# Patient Record
Sex: Female | Born: 1945
Health system: Southern US, Community
[De-identification: ages and names within clinical notes are randomized; demographics above are authoritative.]

## PROBLEM LIST (undated history)

## (undated) DIAGNOSIS — K219 Gastro-esophageal reflux disease without esophagitis: Secondary | ICD-10-CM

## (undated) DIAGNOSIS — R0602 Shortness of breath: Secondary | ICD-10-CM

## (undated) DIAGNOSIS — I10 Essential (primary) hypertension: Secondary | ICD-10-CM

## (undated) DIAGNOSIS — Z9981 Dependence on supplemental oxygen: Secondary | ICD-10-CM

## (undated) DIAGNOSIS — E46 Unspecified protein-calorie malnutrition: Secondary | ICD-10-CM

## (undated) DIAGNOSIS — C3490 Malignant neoplasm of unspecified part of unspecified bronchus or lung: Secondary | ICD-10-CM

## (undated) DIAGNOSIS — M4306 Spondylolysis, lumbar region: Secondary | ICD-10-CM

## (undated) DIAGNOSIS — E876 Hypokalemia: Secondary | ICD-10-CM

## (undated) DIAGNOSIS — K449 Diaphragmatic hernia without obstruction or gangrene: Secondary | ICD-10-CM

## (undated) DIAGNOSIS — R0902 Hypoxemia: Secondary | ICD-10-CM

## (undated) DIAGNOSIS — K5790 Diverticulosis of intestine, part unspecified, without perforation or abscess without bleeding: Secondary | ICD-10-CM

## (undated) DIAGNOSIS — I714 Abdominal aortic aneurysm, without rupture, unspecified: Secondary | ICD-10-CM

## (undated) DIAGNOSIS — D491 Neoplasm of unspecified behavior of respiratory system: Secondary | ICD-10-CM

## (undated) HISTORY — DX: Abdominal aortic aneurysm, without rupture, unspecified: I71.40

## (undated) HISTORY — DX: Diverticulosis of intestine, part unspecified, without perforation or abscess without bleeding: K57.90

## (undated) HISTORY — DX: Essential (primary) hypertension: I10

## (undated) HISTORY — PX: TONSILLECTOMY: SUR1361

## (undated) HISTORY — DX: Unspecified protein-calorie malnutrition: E46

## (undated) HISTORY — DX: Shortness of breath: R06.02

## (undated) HISTORY — DX: Spondylolysis, lumbar region: M43.06

## (undated) HISTORY — DX: Abdominal aortic aneurysm, without rupture: I71.4

## (undated) HISTORY — PX: CATARACT EXTRACTION, BILATERAL: SHX1313

## (undated) HISTORY — DX: Diaphragmatic hernia without obstruction or gangrene: K44.9

## (undated) HISTORY — DX: Hypoxemia: R09.02

## (undated) HISTORY — PX: LUNG BIOPSY: SHX232

## (undated) HISTORY — DX: Gastro-esophageal reflux disease without esophagitis: K21.9

## (undated) HISTORY — DX: Hypokalemia: E87.6

---

## 1999-10-27 ENCOUNTER — Encounter: Admission: RE | Admit: 1999-10-27 | Discharge: 1999-10-27 | Payer: Self-pay | Admitting: Specialist

## 1999-10-27 ENCOUNTER — Ambulatory Visit (HOSPITAL_COMMUNITY): Admission: RE | Admit: 1999-10-27 | Discharge: 1999-10-27 | Payer: Self-pay | Admitting: Specialist

## 1999-10-27 ENCOUNTER — Encounter: Payer: Self-pay | Admitting: Specialist

## 2010-06-29 HISTORY — PX: NM MYOVIEW LTD: HXRAD82

## 2010-09-23 ENCOUNTER — Emergency Department (HOSPITAL_COMMUNITY)
Admission: EM | Admit: 2010-09-23 | Discharge: 2010-09-23 | Disposition: A | Discharge status: Home / Self Care | Payer: Medicare Other | Attending: Emergency Medicine | Admitting: Emergency Medicine

## 2010-09-23 ENCOUNTER — Emergency Department (HOSPITAL_COMMUNITY): Payer: Medicare Other

## 2010-09-23 DIAGNOSIS — I714 Abdominal aortic aneurysm, without rupture, unspecified: Secondary | ICD-10-CM | POA: Insufficient documentation

## 2010-09-23 DIAGNOSIS — M549 Dorsalgia, unspecified: Secondary | ICD-10-CM | POA: Insufficient documentation

## 2010-09-23 DIAGNOSIS — F17200 Nicotine dependence, unspecified, uncomplicated: Secondary | ICD-10-CM | POA: Insufficient documentation

## 2010-09-23 DIAGNOSIS — K5730 Diverticulosis of large intestine without perforation or abscess without bleeding: Secondary | ICD-10-CM | POA: Insufficient documentation

## 2010-09-23 DIAGNOSIS — R109 Unspecified abdominal pain: Secondary | ICD-10-CM | POA: Insufficient documentation

## 2010-09-23 LAB — DIFFERENTIAL
Basophils Relative: 0 % (ref 0–1)
Lymphs Abs: 1.5 10*3/uL (ref 0.7–4.0)
Monocytes Absolute: 0.3 10*3/uL (ref 0.1–1.0)
Monocytes Relative: 4 % (ref 3–12)
Neutro Abs: 6.2 10*3/uL (ref 1.7–7.7)
Neutrophils Relative %: 78 % — ABNORMAL HIGH (ref 43–77)

## 2010-09-23 LAB — COMPREHENSIVE METABOLIC PANEL
BUN: 11 mg/dL (ref 6–23)
CO2: 26 mEq/L (ref 19–32)
Calcium: 8.7 mg/dL (ref 8.4–10.5)
Chloride: 99 mEq/L (ref 96–112)
Creatinine, Ser: 0.82 mg/dL (ref 0.4–1.2)
GFR calc Af Amer: >60 mL/min (ref >60)
GFR calc non Af Amer: >60 mL/min (ref >60)
Glucose, Bld: 119 mg/dL — ABNORMAL HIGH (ref 70–99)
Total Bilirubin: 0.6 mg/dL (ref 0.3–1.2)

## 2010-09-23 LAB — CBC
Hemoglobin: 13.8 g/dL (ref 12.0–15.0)
MCH: 27.9 pg (ref 26.0–34.0)
MCHC: 32 g/dL (ref 30.0–36.0)
MCV: 87.1 fL (ref 78.0–100.0)
RBC: 4.95 MIL/uL (ref 3.87–5.11)

## 2010-09-23 LAB — URINALYSIS, ROUTINE W REFLEX MICROSCOPIC
Bilirubin Urine: NEGATIVE
Glucose, UA: NEGATIVE mg/dL
pH: 6 (ref 5.0–8.0)

## 2010-09-23 LAB — LIPASE, BLOOD: Lipase: 34 U/L (ref 11–59)

## 2010-09-23 LAB — URINE MICROSCOPIC-ADD ON

## 2010-09-25 ENCOUNTER — Inpatient Hospital Stay (HOSPITAL_COMMUNITY)
Admission: EM | Admit: 2010-09-25 | Discharge: 2010-09-28 | Disposition: A | Discharge status: Nursing Home (Includes ICF) | Payer: Medicare Other | Source: Home / Self Care | Attending: Internal Medicine | Admitting: Internal Medicine

## 2010-09-25 ENCOUNTER — Emergency Department (HOSPITAL_COMMUNITY): Payer: Medicare Other

## 2010-09-25 ENCOUNTER — Encounter (HOSPITAL_COMMUNITY): Payer: Self-pay

## 2010-09-25 DIAGNOSIS — R51 Headache: Secondary | ICD-10-CM | POA: Diagnosis not present

## 2010-09-25 DIAGNOSIS — Z01812 Encounter for preprocedural laboratory examination: Secondary | ICD-10-CM

## 2010-09-25 DIAGNOSIS — N390 Urinary tract infection, site not specified: Secondary | ICD-10-CM | POA: Diagnosis present

## 2010-09-25 DIAGNOSIS — M47817 Spondylosis without myelopathy or radiculopathy, lumbosacral region: Secondary | ICD-10-CM | POA: Diagnosis present

## 2010-09-25 DIAGNOSIS — I498 Other specified cardiac arrhythmias: Secondary | ICD-10-CM | POA: Diagnosis present

## 2010-09-25 DIAGNOSIS — E860 Dehydration: Secondary | ICD-10-CM | POA: Diagnosis present

## 2010-09-25 DIAGNOSIS — I714 Abdominal aortic aneurysm, without rupture, unspecified: Principal | ICD-10-CM | POA: Diagnosis present

## 2010-09-25 DIAGNOSIS — E876 Hypokalemia: Secondary | ICD-10-CM | POA: Diagnosis not present

## 2010-09-25 DIAGNOSIS — R42 Dizziness and giddiness: Secondary | ICD-10-CM | POA: Diagnosis not present

## 2010-09-25 DIAGNOSIS — Z01818 Encounter for other preprocedural examination: Secondary | ICD-10-CM

## 2010-09-25 DIAGNOSIS — Z01810 Encounter for preprocedural cardiovascular examination: Secondary | ICD-10-CM

## 2010-09-25 DIAGNOSIS — K219 Gastro-esophageal reflux disease without esophagitis: Secondary | ICD-10-CM | POA: Diagnosis present

## 2010-09-25 DIAGNOSIS — K5730 Diverticulosis of large intestine without perforation or abscess without bleeding: Secondary | ICD-10-CM | POA: Diagnosis present

## 2010-09-25 DIAGNOSIS — I10 Essential (primary) hypertension: Secondary | ICD-10-CM | POA: Diagnosis present

## 2010-09-25 LAB — URINALYSIS, ROUTINE W REFLEX MICROSCOPIC
Nitrite: NEGATIVE
Protein, ur: NEGATIVE mg/dL
Specific Gravity, Urine: 1.008 (ref 1.005–1.030)
Urobilinogen, UA: 0.2 mg/dL (ref 0.0–1.0)

## 2010-09-25 LAB — CBC
MCV: 87.5 fL (ref 78.0–100.0)
Platelets: 261 10*3/uL (ref 150–400)
RBC: 4.96 MIL/uL (ref 3.87–5.11)
WBC: 6 10*3/uL (ref 4.0–10.5)

## 2010-09-25 LAB — BASIC METABOLIC PANEL
BUN: 5 mg/dL — ABNORMAL LOW (ref 6–23)
Calcium: 9.2 mg/dL (ref 8.4–10.5)
GFR calc non Af Amer: >60 mL/min (ref >60)
Potassium: 3.3 mEq/L — ABNORMAL LOW (ref 3.5–5.1)
Sodium: 137 mEq/L (ref 135–145)

## 2010-09-25 LAB — DIFFERENTIAL
Basophils Absolute: 0 10*3/uL (ref 0.0–0.1)
Eosinophils Absolute: 0.1 10*3/uL (ref 0.0–0.7)
Lymphs Abs: 1.5 10*3/uL (ref 0.7–4.0)
Neutrophils Relative %: 68 % (ref 43–77)

## 2010-09-25 LAB — URINE MICROSCOPIC-ADD ON

## 2010-09-25 LAB — LACTIC ACID, PLASMA: Lactic Acid, Venous: 1.2 mmol/L (ref 0.5–2.2)

## 2010-09-25 MED ORDER — IOHEXOL 300 MG/ML  SOLN
100.0000 mL | Freq: Once | INTRAMUSCULAR | Status: AC | PRN
  Administered 2010-09-25: 100 mL via INTRAVENOUS

## 2010-09-26 LAB — LIPID PANEL
LDL Cholesterol: 173 mg/dL — ABNORMAL HIGH (ref 0–99)
Total CHOL/HDL Ratio: 5.9 RATIO
Triglycerides: 160 mg/dL — ABNORMAL HIGH (ref <150)
VLDL: 32 mg/dL (ref 0–40)

## 2010-09-26 LAB — COMPREHENSIVE METABOLIC PANEL
Albumin: 3.6 g/dL (ref 3.5–5.2)
Alkaline Phosphatase: 56 U/L (ref 39–117)
BUN: 2 mg/dL — ABNORMAL LOW (ref 6–23)
Creatinine, Ser: 0.77 mg/dL (ref 0.4–1.2)
Glucose, Bld: 159 mg/dL — ABNORMAL HIGH (ref 70–99)
Total Bilirubin: 0.6 mg/dL (ref 0.3–1.2)
Total Protein: 6.3 g/dL (ref 6.0–8.3)

## 2010-09-26 LAB — URINE CULTURE

## 2010-09-26 LAB — MAGNESIUM: Magnesium: 1.8 mg/dL (ref 1.5–2.5)

## 2010-09-26 LAB — CBC
HCT: 42.8 % (ref 36.0–46.0)
MCH: 27.1 pg (ref 26.0–34.0)
MCHC: 30.6 g/dL (ref 30.0–36.0)
MCV: 88.6 fL (ref 78.0–100.0)
Platelets: 274 10*3/uL (ref 150–400)
RDW: 13.9 % (ref 11.5–15.5)

## 2010-09-27 ENCOUNTER — Observation Stay (HOSPITAL_COMMUNITY): Payer: Medicare Other

## 2010-09-27 DIAGNOSIS — I714 Abdominal aortic aneurysm, without rupture: Secondary | ICD-10-CM

## 2010-09-27 LAB — BASIC METABOLIC PANEL
BUN: 3 mg/dL — ABNORMAL LOW (ref 6–23)
Chloride: 102 mEq/L (ref 96–112)
Creatinine, Ser: 0.79 mg/dL (ref 0.4–1.2)
GFR calc Af Amer: >60 mL/min (ref >60)
GFR calc non Af Amer: >60 mL/min (ref >60)

## 2010-09-27 LAB — COMPREHENSIVE METABOLIC PANEL
ALT: 37 U/L — ABNORMAL HIGH (ref 0–35)
AST: 32 U/L (ref 0–37)
Albumin: 3.5 g/dL (ref 3.5–5.2)
CO2: 27 mEq/L (ref 19–32)
Chloride: 98 mEq/L (ref 96–112)
GFR calc Af Amer: >60 mL/min (ref >60)
GFR calc non Af Amer: >60 mL/min (ref >60)
Sodium: 133 mEq/L — ABNORMAL LOW (ref 135–145)
Total Bilirubin: 0.2 mg/dL — ABNORMAL LOW (ref 0.3–1.2)

## 2010-09-27 LAB — CBC
HCT: 40.5 % (ref 36.0–46.0)
MCH: 27.5 pg (ref 26.0–34.0)
MCHC: 30.9 g/dL (ref 30.0–36.0)
MCV: 89.2 fL (ref 78.0–100.0)
RDW: 14 % (ref 11.5–15.5)

## 2010-09-27 MED ORDER — IOHEXOL 300 MG/ML  SOLN: 125.0000 mL | Freq: Once | INTRAMUSCULAR | Status: AC | PRN

## 2010-09-28 ENCOUNTER — Inpatient Hospital Stay (HOSPITAL_COMMUNITY)
Admission: AD | Admit: 2010-09-28 | Discharge: 2010-10-05 | DRG: 238 | Disposition: A | Discharge status: Home / Self Care | Payer: Medicare Other | Source: Other Acute Inpatient Hospital | Attending: Internal Medicine | Admitting: Internal Medicine

## 2010-09-28 ENCOUNTER — Inpatient Hospital Stay (HOSPITAL_COMMUNITY): Payer: Medicare Other

## 2010-09-28 DIAGNOSIS — I714 Abdominal aortic aneurysm, without rupture: Secondary | ICD-10-CM

## 2010-09-28 DIAGNOSIS — I517 Cardiomegaly: Secondary | ICD-10-CM

## 2010-09-28 LAB — DIFFERENTIAL
Basophils Absolute: 0 10*3/uL (ref 0.0–0.1)
Basophils Relative: 0 % (ref 0–1)
Eosinophils Absolute: 0 10*3/uL (ref 0.0–0.7)
Eosinophils Relative: 1 % (ref 0–5)
Monocytes Absolute: 0.6 10*3/uL (ref 0.1–1.0)
Monocytes Relative: 8 % (ref 3–12)
Neutro Abs: 5.4 10*3/uL (ref 1.7–7.7)

## 2010-09-28 LAB — CARDIAC PANEL(CRET KIN+CKTOT+MB+TROPI)
CK, MB: 1.1 ng/mL (ref 0.3–4.0)
Relative Index: INVALID (ref 0.0–2.5)
Relative Index: INVALID (ref 0.0–2.5)
Total CK: 73 U/L (ref 7–177)
Total CK: 57 U/L (ref 7–177)
Troponin I: 0.24 ng/mL — ABNORMAL HIGH (ref 0.00–0.06)
Troponin I: 0.13 ng/mL — ABNORMAL HIGH (ref 0.00–0.06)

## 2010-09-28 LAB — COMPREHENSIVE METABOLIC PANEL
AST: 28 U/L (ref 0–37)
CO2: 30 mEq/L (ref 19–32)
Calcium: 9 mg/dL (ref 8.4–10.5)
Chloride: 97 mEq/L (ref 96–112)
Creatinine, Ser: 0.83 mg/dL (ref 0.4–1.2)
GFR calc non Af Amer: >60 mL/min (ref >60)
Glucose, Bld: 131 mg/dL — ABNORMAL HIGH (ref 70–99)
Total Bilirubin: 0.4 mg/dL (ref 0.3–1.2)

## 2010-09-28 LAB — LIPASE, BLOOD: Lipase: 38 U/L (ref 11–59)

## 2010-09-28 LAB — CBC
MCH: 28.2 pg (ref 26.0–34.0)
MCHC: 32.2 g/dL (ref 30.0–36.0)
Platelets: 277 10*3/uL (ref 150–400)
RDW: 14.1 % (ref 11.5–15.5)

## 2010-09-28 LAB — ABO/RH: ABO/RH(D): O POS

## 2010-09-28 LAB — HEMOGLOBIN A1C: Hgb A1c MFr Bld: 5.6 % (ref <5.7)

## 2010-09-28 LAB — PROTIME-INR: INR: 0.89 (ref 0.00–1.49)

## 2010-09-28 LAB — TSH: TSH: 1.797 u[IU]/mL (ref 0.350–4.500)

## 2010-09-28 LAB — GLUCOSE, CAPILLARY

## 2010-09-29 DIAGNOSIS — I714 Abdominal aortic aneurysm, without rupture: Secondary | ICD-10-CM

## 2010-09-29 DIAGNOSIS — I214 Non-ST elevation (NSTEMI) myocardial infarction: Secondary | ICD-10-CM

## 2010-09-29 LAB — CBC
HCT: 38.5 % (ref 36.0–46.0)
Hemoglobin: 12.1 g/dL (ref 12.0–15.0)
MCH: 27.6 pg (ref 26.0–34.0)
MCHC: 31.4 g/dL (ref 30.0–36.0)
RDW: 13.9 % (ref 11.5–15.5)

## 2010-09-29 LAB — BASIC METABOLIC PANEL
BUN: 5 mg/dL — ABNORMAL LOW (ref 6–23)
CO2: 31 mEq/L (ref 19–32)
Calcium: 8.7 mg/dL (ref 8.4–10.5)
Chloride: 98 mEq/L (ref 96–112)
Creatinine, Ser: 0.7 mg/dL (ref 0.4–1.2)
Glucose, Bld: 102 mg/dL — ABNORMAL HIGH (ref 70–99)

## 2010-09-30 ENCOUNTER — Inpatient Hospital Stay (HOSPITAL_COMMUNITY): Payer: Medicare Other

## 2010-09-30 ENCOUNTER — Other Ambulatory Visit (HOSPITAL_COMMUNITY): Payer: Medicare Other

## 2010-09-30 DIAGNOSIS — Z01810 Encounter for preprocedural cardiovascular examination: Secondary | ICD-10-CM

## 2010-09-30 DIAGNOSIS — R7989 Other specified abnormal findings of blood chemistry: Secondary | ICD-10-CM

## 2010-09-30 LAB — CBC
HCT: 38.4 % (ref 36.0–46.0)
Hemoglobin: 12.1 g/dL (ref 12.0–15.0)
MCHC: 31.5 g/dL (ref 30.0–36.0)
RBC: 4.43 MIL/uL (ref 3.87–5.11)
WBC: 6.4 10*3/uL (ref 4.0–10.5)

## 2010-09-30 LAB — BASIC METABOLIC PANEL
CO2: 31 mEq/L (ref 19–32)
Chloride: 98 mEq/L (ref 96–112)
GFR calc Af Amer: >60 mL/min (ref >60)
Glucose, Bld: 91 mg/dL (ref 70–99)
Potassium: 3.7 mEq/L (ref 3.5–5.1)
Sodium: 137 mEq/L (ref 135–145)

## 2010-09-30 LAB — URINE CULTURE

## 2010-09-30 MED ORDER — TECHNETIUM TC 99M TETROFOSMIN IV KIT
10.0000 | PACK | Freq: Once | INTRAVENOUS | Status: AC | PRN
  Administered 2010-09-30: 10 via INTRAVENOUS

## 2010-09-30 MED ORDER — TECHNETIUM TC 99M TETROFOSMIN IV KIT
30.0000 | PACK | Freq: Once | INTRAVENOUS | Status: AC | PRN
  Administered 2010-09-30: 30 via INTRAVENOUS

## 2010-10-01 ENCOUNTER — Inpatient Hospital Stay (HOSPITAL_COMMUNITY): Payer: Medicare Other

## 2010-10-01 DIAGNOSIS — I714 Abdominal aortic aneurysm, without rupture: Secondary | ICD-10-CM

## 2010-10-01 HISTORY — PX: ABDOMINAL AORTIC ANEURYSM REPAIR: SUR1152

## 2010-10-01 LAB — CBC
HCT: 38.5 % (ref 36.0–46.0)
HCT: 33.6 % — ABNORMAL LOW (ref 36.0–46.0)
Hemoglobin: 12.1 g/dL (ref 12.0–15.0)
Hemoglobin: 10.8 g/dL — ABNORMAL LOW (ref 12.0–15.0)
MCH: 27.6 pg (ref 26.0–34.0)
MCH: 27.5 pg (ref 26.0–34.0)
MCHC: 32.1 g/dL (ref 30.0–36.0)
MCV: 87.7 fL (ref 78.0–100.0)
RBC: 4.39 MIL/uL (ref 3.87–5.11)
RBC: 3.93 MIL/uL (ref 3.87–5.11)
WBC: 6 10*3/uL (ref 4.0–10.5)

## 2010-10-01 LAB — BASIC METABOLIC PANEL
BUN: 8 mg/dL (ref 6–23)
CO2: 32 mEq/L (ref 19–32)
Calcium: 8.3 mg/dL — ABNORMAL LOW (ref 8.4–10.5)
Chloride: 99 mEq/L (ref 96–112)
Creatinine, Ser: 0.77 mg/dL (ref 0.4–1.2)
GFR calc Af Amer: >60 mL/min (ref >60)
GFR calc non Af Amer: >60 mL/min (ref >60)
Glucose, Bld: 113 mg/dL — ABNORMAL HIGH (ref 70–99)
Potassium: 3.8 mEq/L (ref 3.5–5.1)
Potassium: 3.6 mEq/L (ref 3.5–5.1)
Sodium: 133 mEq/L — ABNORMAL LOW (ref 135–145)

## 2010-10-01 LAB — PROTIME-INR
INR: 0.93 (ref 0.00–1.49)
INR: 0.98 (ref 0.00–1.49)
Prothrombin Time: 12.7 seconds (ref 11.6–15.2)
Prothrombin Time: 13.2 seconds (ref 11.6–15.2)

## 2010-10-02 LAB — BASIC METABOLIC PANEL
CO2: 31 mEq/L (ref 19–32)
Chloride: 97 mEq/L (ref 96–112)
GFR calc Af Amer: >60 mL/min (ref >60)
Potassium: 4.2 mEq/L (ref 3.5–5.1)
Sodium: 133 mEq/L — ABNORMAL LOW (ref 135–145)

## 2010-10-02 LAB — TYPE AND SCREEN
Antibody Screen: NEGATIVE
Unit division: 0
Unit division: 0

## 2010-10-02 LAB — CBC
Hemoglobin: 10.9 g/dL — ABNORMAL LOW (ref 12.0–15.0)
Platelets: 240 10*3/uL (ref 150–400)
RBC: 3.92 MIL/uL (ref 3.87–5.11)
WBC: 8.2 10*3/uL (ref 4.0–10.5)

## 2010-10-03 DIAGNOSIS — R072 Precordial pain: Secondary | ICD-10-CM

## 2010-10-03 LAB — CBC
HCT: 35.4 % — ABNORMAL LOW (ref 36.0–46.0)
Hemoglobin: 11.3 g/dL — ABNORMAL LOW (ref 12.0–15.0)
MCH: 27.8 pg (ref 26.0–34.0)
MCV: 87.2 fL (ref 78.0–100.0)
Platelets: 227 10*3/uL (ref 150–400)
RBC: 4.06 MIL/uL (ref 3.87–5.11)
WBC: 9 10*3/uL (ref 4.0–10.5)

## 2010-10-03 LAB — BASIC METABOLIC PANEL
BUN: 8 mg/dL (ref 6–23)
CO2: 31 mEq/L (ref 19–32)
Chloride: 96 mEq/L (ref 96–112)
Glucose, Bld: 117 mg/dL — ABNORMAL HIGH (ref 70–99)
Potassium: 4.3 mEq/L (ref 3.5–5.1)

## 2010-10-03 LAB — HEPATIC FUNCTION PANEL
ALT: 22 U/L (ref 0–35)
Alkaline Phosphatase: 75 U/L (ref 39–117)
Indirect Bilirubin: 0.3 mg/dL (ref 0.3–0.9)
Total Bilirubin: 0.5 mg/dL (ref 0.3–1.2)
Total Protein: 5.9 g/dL — ABNORMAL LOW (ref 6.0–8.3)

## 2010-10-03 NOTE — Consult Note (Signed)
Chelsea Baker, Chelsea Baker             ACCOUNT NO.:  [id number]  MEDICAL RECORD NO.:  [id number]           PATIENT TYPE:  I  LOCATION:  2313                         FACILITY:  [facility]  PHYSICIAN:  Chelsea Baker. Chelsea Ridgel, MD    DATE OF BIRTH:  [birth year]  DATE OF CONSULTATION:  09/28/2010 DATE OF DISCHARGE:                                CONSULTATION   The consultation is requested by the [facility].  INDICATION FOR CONSULTATION:  Evaluation of elevated cardiac enzymes in the patient with multiple cardiac risk factors.  HISTORY OF PRESENT ILLNESS:  The patient is a 65 year old woman who is in her usual state of health until presenting to the hospital with sudden abdominal pain associated with nausea and vomiting.  The patient initially had lower abdominal pain beginning about a week ago.  It went into her left back and had no relieving factors.  She had some nausea and vomiting as well and presented for additional evaluation.  Her presumptive diagnosis was acute pyelonephritis.  She had negative nitrite, but moderate leukocytes on a urinalysis and only a few 11/20 white blood cells.  The patient, however, developed severe abdominal pain yesterday.  She stated that she felt like she was going to die. She was subsequently found to have an isolated abdominal aortic aneurysm.  There is a questionable area of an ulcerated plaque.  She had cardiac enzymes obtained which were elevated at 0.2 and she is referred now for additional evaluation.  The patient's parents both died of heart disease.  Her father in his early 40s and mother in her mid 49s.  The patient denies chest discomfort.  She denies shortness of breath.  Her additional review of systems is otherwise unremarkable.  She does not have dysuria.  She has been on antibiotics.  Her social history, the patient denies alcohol abuse.  She does smoke cigarettes typically 1-2 packs a day and has done so most of her  adult life.  Her review of systems is unremarkable, although other than noted in the HPI, otherwise all systems reviewed.  PHYSICAL EXAMINATION:  GENERAL:  She is a pleasant well-appearing 76- year-old woman, in no distress. VITAL SIGNS:  Her blood pressure was 136/80, the pulse was 80 and regular and the respirations were 18. HEENT:  Normocephalic and atraumatic.  Pupils equal and round.  The oropharynx is moist.  Sclerae anicteric. NECK:  No jugular venous distention.  There is no thyromegaly.  The trachea is midline. LUNGS:  Clear bilaterally to auscultation.  No wheezes, rales or rhonchi are present and no increased work of breathing. CARDIAC:  Regular rate and rhythm.  Normal S1 and S2.  The PMI was not enlarged nor was it laterally displaced. ABDOMEN:  Soft and nontender.  There is no organomegaly. EXTREMITIES:  Demonstrated no cyanosis, clubbing or edema.  The pulses are 3+ and symmetric.  There was no pulsatile masses present. SKIN:  Warm and dry. NEUROLOGIC:  Alert and oriented x2.  Cranial nerves intact.  Strength is 5/5 and symmetric.  EKG demonstrates sinus rhythm with no acute ST-T wave changes.  IMPRESSION: 1. Abdominal pain, questionable  due to abdominal aortic aneurysm     versus urinary tract infection. 2. Elevated cardiac enzymes with slightly elevated troponin's and     normal CK-MB. 3. Longstanding tobacco abuse.  DISCUSSION:  The patient has certainly multiple risk factors for vascular disease, though I doubt she has an acute coronary syndrome.  I would recommend she continue her cardiac enzymes.  Primary prevention will be recommended with aggressive lower in her LDL cholesterol with statin therapy.  I would also recommend that she stop smoking. Additional vascular workup under the direction of Dr. [facility].  I think that aortic angiography would be indicated at this time.  We will plan to follow the patient with you and make  additional recommendations if her cardiac markers increase.     Chelsea Baker. Chelsea Ridgel, MD     GWT/MEDQ  D:  09/28/2010  T:  09/29/2010  Job:  [id number]  Electronically Signed by Lewayne Bunting MD on 10/03/2010 06:46:17 AM

## 2010-10-04 ENCOUNTER — Inpatient Hospital Stay (HOSPITAL_COMMUNITY): Payer: Medicare Other

## 2010-10-04 LAB — CULTURE, BLOOD (ROUTINE X 2)
Culture  Setup Time: 201204010013
Culture  Setup Time: 201204010013
Culture: NO GROWTH
Culture: NO GROWTH

## 2010-10-06 NOTE — Op Note (Signed)
Chelsea Baker, Chelsea Baker             ACCOUNT NO.:  [id number]  MEDICAL RECORD NO.:  [id number]           PATIENT TYPE:  I  LOCATION:  3301                         FACILITY:  [facility]  PHYSICIAN:  Quita Skye. [facility], M.D.  DATE OF BIRTH:  27-Jan-1946  DATE OF PROCEDURE:  10/01/2010 DATE OF DISCHARGE:                              OPERATIVE REPORT   PREOPERATIVE DIAGNOSIS:  Infrarenal abdominal aortic aneurysm with abdominal pain.  POSTOPERATIVE DIAGNOSIS:  Infrarenal abdominal aortic aneurysm with abdominal pain.  OPERATION:  Insertion of an aorto-by-common iliac Gore - Excluder - C - 3 stent graft via bilateral percutaneous access with femoral artery repair using Pro-Glide closure device using, a.  26 mm x 12 mm x 12 cm primary right device with b. 12 mm x 7 cm iliac extender for the contralateral left limb with completion angiography.  SURGEON:  Quita Skye. [facility], MD  FIRST ASSISTANT:  Della Goo, PA-C  ANESTHESIA:  General endotracheal.  PROCEDURE:  The patient was taken to the operating room, placed in the supine position at which time satisfactory general endotracheal anesthesia was administered.  Swan-Ganz catheter and radial arterial line were inserted by Anesthesia.  The abdomen and groins were prepped with Betadine scrub and solution draped in routine sterile manner. Bilateral femoral artery exposure was performed using ultrasound guidance and pro-glide device was utilized for femoral artery repair using two devices on the right at 11 and 1 o'clock position and 3 on the left at 11, 12, and 1 o'clock positions for later suture closure.  Short A sheath was placed in the right femoral artery and long A sheath placed in left femoral artery.  A pigtail catheter was then passed up the left side and positioned in the suprarenal aorta and a flush abdominal aortogram performed injecting 20 mL of contrast at 20 mL per second. This revealed the aorta had single renal arteries.  The  level of the renal arteries was noted.  The length of the aorta was only 9 cm with 12 cm to the origin of the hypogastrics bilaterally.  A 26 mm x 12 mm x 12 cm device was selected and then deployed just distal to the renal arteries.  Following this, the contralateral gate was easily cannulated using the long sheath and a Comfy catheter.  This was then exchanged for an Amplatz wire.  Retrograde angiogram was performed through the sheath on the left to localize the exact location of the hypogastric and a 12 mm x 7 mm iliac extender limb was utilized to deploy in the common iliac proximal to the hypogastric.  This was done without difficulty. Following this, a retrograde angiogram was performed through the sheath on the right.  It was then evident that the distal right limb was encroaching slightly on the hypogastric origin.  This was the shortest graft available and unfortunately the only one that could be utilized for this situation.  Therefore, to try to avoid covering the hypogastric, we put constant tension on the end of graft and deployed it proximal to the hypogastric.  All of the areas were then dilated using a coated balloon and completion angiogram  was performed injecting 20 mL of contrast at 20 mL per second.  This revealed that the right hypogastric was covered by the graft despite our efforts for it to remain patent. However, the left hypogastric was widely patent.  There was no evidence of any Endoleak originating from the hypogastric or any of the other aortic branches with the graft being in an excellent position. Therefore, the sheaths and wires were removed.  The closure completed in the femoral arteries with the sutures being secured.  Protamine was then given to reverse the heparin, which had been administered earlier.  Adequate hemostasis was achieved in the inguinal areas and the groin was closed in a subcuticular fashion with 3- 0 Vicryl.  Sterile dressing applied.   The patient taken to recovery room in a stable condition.     Quita Skye Hart [facility], M.D.     JDL/MEDQ  D:  10/01/2010  T:  10/02/2010  Job:  [id number]  Electronically Signed by Josephina Gip M.D. on 10/06/2010 10:21:29 AM

## 2010-10-06 NOTE — Consult Note (Addendum)
Chelsea Baker, Chelsea Baker             ACCOUNT NO.:  [id number]  MEDICAL RECORD NO.:  [id number]          PATIENT TYPE:  INP  LOCATION:                               FACILITY:  [facility]  PHYSICIAN:  Quita Skye. Hart [facility], M.D.  DATE OF BIRTH:  Jul 04, 1945  DATE OF CONSULTATION:  09/27/2010 DATE OF DISCHARGE:                                CONSULTATION   Referred by Dr. Rito Ehrlich.  CHIEF COMPLAINT:  Abdominal pain with small abdominal aortic aneurysm.  HISTORY OF PRESENT ILLNESS:  This 65 year old Caucasian female has no history of significant medical problems.  About 1 week ago, she began having discomfort in her lower abdomen radiating into her left flank and into the left hip which was quite severe.  She had nausea and vomiting multiple times apparently as well as persistent pain.  She did have urinary frequency, but no dysuria.  She was seen in the emergency department on September 23, 2010, and thought possibly to have pyelonephritis.  She had a CT scan which revealed a very small infrarenal abdominal aortic aneurysm which was quite focal measuring about 3-3.1 cm in maximum diameter with no evidence of any leakage.  She returned to the hospital on September 25, 2010, when she was admitted with nausea, vomiting and lower abdominal discomfort as well as left hip pain.  She has been in the hospital for 48 hours with waxing and waning of her symptoms.  Today apparently, the symptoms worsened in the afternoon and a third CT angiogram was performed which again reveals no change.  I have reviewed the CT angiograms from March 29 and September 27, 2010.  The aorta appears relatively normal proximally below the renals and distally, but there is a focal aneurysmal dilatation to the patient's left side about midway between the renal arteries and the bifurcation with a small amount of contrast in the laminated thrombus in this area.  There is no evidence of rupture or leakage of the aneurysm. Since her hospital  stay, her hemoglobin has been stable.  She has had no hypotension.  She has had good urinary output, but has had sinus tachycardia.  CHRONIC MEDICAL PROBLEMS: 1. Hypertension. 2. Possible diverticular disease by CT scan. 3. Negative for coronary artery disease, diabetes, COPD or stroke.  SURGICAL HISTORY:  Tonsillectomy.  SOCIAL HISTORY:  The patient lives in [place] with her son.  She is currently unemployed, works in [employer].  Smokes two pack of cigarettes per day and has done so for 50+ years and now drinks occasional alcohol.  FAMILY HISTORY:  Father had coronary artery disease died at age 60. Mother had congestive heart failure.  No diabetes or stroke.  REVIEW OF SYSTEMS:  Denies any chest pain, dyspnea on exertion, denied claudication prior to the present illness, states has a completely negative review of systems.  PHYSICAL EXAMINATION:  VITAL SIGNS:  Blood pressure is 140/76, heart rate is 110, respirations 14. GENERAL:  She is a middle-aged female, in no apparent distress.  Alert and oriented x3. HEENT:  Normal for age.  EOMs intact. CHEST:  Clear to auscultation.  No rhonchi or wheezing. CARDIOVASCULAR:  Regular rhythm  with sinus tachycardia.  Carotid pulses are 3+.  No audible bruits. ABDOMEN:  Soft.  There is no rebound tenderness.  She does have some mild tenderness to deep palpation in the suprapubic area.  She points to her left hip were the discomfort is most severe.  Both lower extremities are well-perfused with 3+ femoral popliteal, dorsalis pedis and posterior tibial pulses with no edema.  Renal function is normal with creatinine of 0.8 and a BUN of 4.  IMPRESSION:  Lower abdominal left flank and left hip pain of the etiology unknown with small focal aneurysm of the mid abdominal infrarenal aorta with slight amount of contrast in the laminated thrombus of the patient's left side, but no evidence of any rupture.  PLAN:  I am not convinced that the  aneurysm has anything to do with the patient's present illness nor is causing any of her symptoms.  I would recommend transferring her to [facility] to the hospitalist service to continue her medical management.  If her symptoms persists for several days despite antibiotic treatment, we may consider stent grafting of her aorta to eliminate this as a cause of her pain, although I do not think that this is likely that this is the culprit.  I have discussed this with Dr. Lovell Sheehan and we will arrange transfer of the patient to [facility] to the hospitalist service.     Quita Skye Hart Rochester, M.D.     JDL/MEDQ  D:  09/27/2010  T:  09/28/2010  Job:  [id number]  Electronically Signed by Josephina Gip M.D. on 10/06/2010 10:21:27 AM

## 2010-10-09 ENCOUNTER — Other Ambulatory Visit: Payer: Self-pay | Admitting: Vascular Surgery

## 2010-10-09 DIAGNOSIS — T82330A Leakage of aortic (bifurcation) graft (replacement), initial encounter: Secondary | ICD-10-CM

## 2010-10-09 DIAGNOSIS — I729 Aneurysm of unspecified site: Secondary | ICD-10-CM

## 2010-10-09 DIAGNOSIS — Z8679 Personal history of other diseases of the circulatory system: Secondary | ICD-10-CM

## 2010-10-09 DIAGNOSIS — I714 Abdominal aortic aneurysm, without rupture: Secondary | ICD-10-CM

## 2010-10-12 ENCOUNTER — Emergency Department (HOSPITAL_COMMUNITY): Payer: Medicare Other

## 2010-10-12 ENCOUNTER — Emergency Department (HOSPITAL_COMMUNITY)
Admission: EM | Admit: 2010-10-12 | Discharge: 2010-10-12 | Disposition: A | Discharge status: Home / Self Care | Payer: Medicare Other | Attending: Emergency Medicine | Admitting: Emergency Medicine

## 2010-10-12 DIAGNOSIS — M542 Cervicalgia: Secondary | ICD-10-CM | POA: Insufficient documentation

## 2010-10-12 DIAGNOSIS — IMO0002 Reserved for concepts with insufficient information to code with codable children: Secondary | ICD-10-CM | POA: Insufficient documentation

## 2010-10-12 DIAGNOSIS — S20219A Contusion of unspecified front wall of thorax, initial encounter: Secondary | ICD-10-CM | POA: Insufficient documentation

## 2010-10-12 DIAGNOSIS — R079 Chest pain, unspecified: Secondary | ICD-10-CM | POA: Insufficient documentation

## 2010-10-12 LAB — POCT I-STAT, CHEM 8
BUN: 16 mg/dL (ref 6–23)
Calcium, Ion: 1.13 mmol/L (ref 1.12–1.32)
Chloride: 103 mEq/L (ref 96–112)
Creatinine, Ser: 1.1 mg/dL (ref 0.4–1.2)
Glucose, Bld: 118 mg/dL — ABNORMAL HIGH (ref 70–99)
HCT: 41 % (ref 36.0–46.0)
Hemoglobin: 13.9 g/dL (ref 12.0–15.0)
Potassium: 3.9 mEq/L (ref 3.5–5.1)
Sodium: 136 mEq/L (ref 135–145)
TCO2: 25 mmol/L (ref 0–100)

## 2010-10-12 MED ORDER — IOHEXOL 300 MG/ML  SOLN
80.0000 mL | Freq: Once | INTRAMUSCULAR | Status: AC | PRN
  Administered 2010-10-12: 80 mL via INTRAVENOUS

## 2010-10-13 NOTE — H&P (Addendum)
NAMEGIADA, Baker             ACCOUNT NO.:  [id number]  MEDICAL RECORD NO.:  [id number]           PATIENT TYPE:  O  LOCATION:  1318                         FACILITY:  [facility]  PHYSICIAN:  Osvaldo Shipper, MD     DATE OF BIRTH:  01-06-1946  DATE OF ADMISSION:  09/25/2010 DATE OF DISCHARGE:                             HISTORY & PHYSICAL   PRIMARY CARE PHYSICIAN:  The patient is unassigned.  Chelsea Baker does not have a PCP.  ADMISSION DIAGNOSES: 1. Acute pyelonephritis. 2. Nausea, vomiting. 3. Abdominal pain. 4. Dehydration. 5. Hypokalemia.  CHIEF COMPLAINT:  Nausea, vomiting and abdominal pain for a week.  HISTORY OF PRESENT ILLNESS:  The patient is a 65 year old Caucasian female who does not have any medical problems.  Chelsea Baker was in her usual state of health last Friday when Chelsea Baker started noticing pain in her lower abdomen, all the way across anteriorly and then this radiated to the back, especially to the left back.  The pain is 8/10 in intensity, worsening with movement, no real relieving factors, and associated with nausea, vomiting which Chelsea Baker has had multiple times, at least three times yesterday without any blood, one time up on the floor.  Denies any dysuria but has noted some increased frequency.  Denies any blood in the urine.  No fever at home.  Chelsea Baker has been having a headache in the last few hours and Chelsea Baker has been throwing up.  Note, Chelsea Baker has grandchildren who have had some cough and cold symptoms.  Chelsea Baker has never had similar symptoms in the past.  Denies any recent antibiotic use.  Admits to poor appetite.  No diarrhea, and questionable weight loss.  MEDICATIONS AT HOME:  None.  Over-the-counter includes Benadryl, ibuprofen twice a day.  Chelsea Baker was prescribed Percocet by the ED 2 days ago.  ALLERGIES:  No known drug allergies.  PAST MEDICAL HISTORY:  Presented to the ED on Tuesday with similar complaints and was diagnosed with possible nephrolithiasis even though the CT did  not show any stones and was discharged home with Percocet.  SURGICAL HISTORY:  Includes tonsillectomy and removal of toenails.  SOCIAL HISTORY:  Lives in [place] with her son.  Currently unemployed, Chelsea Baker works in [employer].  Smokes two pack of cigarettes on a daily basis.  Alcohol use one time per month.  No illicit drug use.  FAMILY HISTORY:  Father died at the age of 41 of heart complications. Mother died at the age of 74 of CHF.  Chelsea Baker has one brother who does not have any medical problems.  REVIEW OF SYSTEMS:  GENERAL:  Positive for weakness, malaise.  HEENT: Unremarkable.  CARDIOVASCULAR:  Unremarkable.  RESPIRATORY: Unremarkable.  GI:  As in HPI.  GU:  As in HPI.  NEUROLOGIC: Unremarkable.  PSYCHIATRIC:  Unremarkable.  DERMATOLOGIC:  Unremarkable. All systems reviewed and found to be negative.  PHYSICAL EXAMINATION:  VITAL SIGNS:  Chelsea Baker was found to be afebrile, temperature was 98.5; blood pressure initially when Chelsea Baker came into the ED was 179/108, subsequently 147/89; heart rate 98; respiratory rate 18; saturation 98% to 100% on room air.  GENERAL:  A thin white  female in no distress.  HEENT:  Head is normocephalic, atraumatic.  Pupils are equal, reacting.  No pallor, no icterus.  Oral mucous membranes dry.  No oral lesions are noted.  NECK:  Soft and supple.  No thyromegaly is appreciated.  LUNGS:  Decreased air entry at the bases without any crackles or wheezing.  CARDIOVASCULAR:  S1, S2 normal, regular.  No S3, S4, rubs, murmurs or bruits.  No pedal edema.  No JVD.  ABDOMEN:  Soft. Tenderness is present over the suprapubic area without any rebound, rigidity or guarding.  Left CVA tenderness is also present. GENITOURINARY:  Deferred.  MUSCULOSKELETAL:  Normal muscle mass and tone.  NEUROLOGIC:  Chelsea Baker is alert, oriented x3.  No cranial deficits.  No motor deficits.  No sensory deficits are present at this time.  IMAGING STUDIES:  Chelsea Baker had a CT of the abdomen and pelvis with  contrast which showed a small abdominal aortic aneurysm 3 x 2.9 cm infrarenal. Sigmoid diverticulosis without diverticulitis was noted.  Chelsea Baker had a CT without contrast on the 27th which did not show any renal calculi or hydronephrosis.  The aneurysm was once again noticed along with the diverticulosis.  LABORATORY DATA:  Her white cell count is normal 6.0, hemoglobin is 13.8, platelet count is 261.  Potassium is 3.3, glucose 119, lactic acid 1.2.  UA shows small blood, negative nitrite, moderate leukocytes, few squamous epithelial cells, 11-20 WBCs, 3-6 RBCs, rare bacteria.  Urine culture is pending.  ASSESSMENT AND PLAN:  This is a 65 year old Caucasian female with no medical problems who presents with abdominal pain, nausea and vomiting. Most likely this is an acute pyelonephritis with other symptoms as the result of this main problem.  Chelsea Baker has tobacco abuse as well. 1. Acute pyelonephritis with nausea, vomiting and pain:  Ceftriaxone     will be initiated.  Symptomatic treatment for the pain and the     nausea, vomiting will be provided. 2. Headache, probably as a result of the acute illness and the acute     symptomatology:  Chelsea Baker does not have any focal neurological deficits.     We will give her analgesia. 3. Tobacco abuse.  Chelsea Baker declines nicotine patch at this time. 4. Proton pump inhibitor will be given, deep venous thrombosis     prophylaxis will be initiated. 5. Chelsea Baker is a Full Code.  Potassium will be supplemented using the IV route for her hypokalemia. Labs will be rechecked in the morning.  Further management decisions will depend on results of further testing and the patient's response to treatment.     Osvaldo Shipper, MD    GK/MEDQ  D:  09/25/2010  T:  09/25/2010  Job:  [id number]  Electronically Signed by Osvaldo Shipper MD on 10/12/2010 08:30:41 PM

## 2010-10-15 NOTE — Discharge Summary (Signed)
Chelsea Baker, Chelsea Baker             ACCOUNT NO.:  [id number]  MEDICAL RECORD NO.:  [id number]           PATIENT TYPE:  I  LOCATION:  2036                         FACILITY:  [facility]  PHYSICIAN:  Kathlen Mody, MD       DATE OF BIRTH:  11-02-1945  DATE OF ADMISSION:  09/28/2010 DATE OF DISCHARGE:  10/05/2010                              DISCHARGE SUMMARY   DISCHARGE DIAGNOSES: 1. Infrarenal abdominal aortic aneurysm status post repair. 2. Dehydration. 3. Hypokalemia. 4. Hypertension. 5. Diverticulosis. 6. Lumbar spondylosis. 7. Urinary tract infection. 8. Elevated troponins.  DISCHARGE MEDICATIONS: 1. Aspirin 325 mg daily. 2. Metoprolol 25 mg b.i.d. 3. Zocor 40 mg daily. 4. Tylenol p.r.n. 5. Percocet 5/325 one tablet q.6 h. p.r.n. 6. Benadryl 25 mg 1 tablet p.r.n.  PERTINENT LABS:  On admission, the patient had a basic metabolic panel significant for potassium of 3.3, glucose of 119, BUN of 5.  CBC within normal limits.  Urinalysis shows moderate leukocytes.  Urine culture showed 20,000 colonies of bacterial morphotypes.  Lactic acid was 1.1. Cardiac enzymes, the first set was negative.  MRSA screen negative. Second set of cardiac enzymes significant for a troponin of 0.24, hemoglobin A1c of 5.6.  Third set of cardiac enzymes showed an elevated troponin of 0.17, lipase of 38.  TSH normal.  Amylase was 48.  Urine culture shows no growth.  Blood cultures negative.  RADIOLOGY:  The patient had a CT abdomen and pelvis with contrast that shows small abdominal aortic aneurysm 3 x 2.9 cm infrarenal, sigmoid diverticulosis without evidence of diverticulitis.  CT head without contrast shows small vessel disease changes.  CT angiogram shows saccular aneurysm of the infrarenal abdominal aorta with focal outpouching of contrast, which could relate to focal dissection or penetrating ulcer.  CT angiogram of the chest shows no evidence of dissection in thoracic aorta.  Ultrasound of the  kidney normal.  Myoview scan, which was normal.  No signs of ischemia.  Normal LV systolic function.  Abdominal x-ray showed an aorto-bi-iliac stent graft placement.  Chest x-ray, right CVC catheter confirmation placement.  MRI of the lumbar spine, mild-to-moderate multilevel lumbar spondylosis, more pronounced at L3-L4, foraminal stenosis at L3-L4 involving roots L3 and L4 nerve roots.  CONSULTS CALLED: 1. Cardiology consult called for elevated troponins. 2. Vascular Surgery consult called for infrarenal abdominal aortic     aneurysm.  PROCEDURES DONE:  Insertion of aorto-bi-common bi-iliac stent on October 01, 2010.  BRIEF HOSPITAL COURSE:  This is a 65 year old lady who does not have any significant past medical history came into the ER complaining of pain in the lower abdomen and also left flank pain.  Initially, she was worked up for acute pyelonephritis.  She was started on IV antibiotics.  She had a CT of the abdomen and pelvis with contrast done, which did not show any pyelonephritis, did not show any renal stones, instead it showed an infrarenal abdominal aortic aneurysm with focal outpouching consistent with possible dissection.  Vascular Surgery consult was called for surgical repair and stent placement.  During her hospitalization, her cardiac enzymes were cycled, the second and the third  set of cardiac enzymes were elevated for which Cardiology consult was called.  She underwent a Myoview scan, which came back negative for ischemia with normal LV systolic function.  Over the course of her hospitalization after the stent placement in the abdominal aorta, her abdominal pain has resolved.  Urinary tract infection.  Her UA was abnormal.  The urine cultures came back negative.  She was on Rocephin for all along.  She completed the course of Rocephin today.  Back pain.  The patient was also complaining of flank pain and back pain.  Has a history of sciatica.  MRI of the  lumbosacral spine was done, which showed a lumbar spondylosis mild-to-moderate and foraminal stenosis at the level of L3-L4.  PT/OT consult was called.  Recommended outpatient physical therapy.  The patient on pain medication.  The patient was recommended to go to PCP for further evaluation and management of the back pain.  Hyperlipidemia.  She was started on Zocor 40 mg daily.  For hypertension, she was started on beta-blocker, small dose metoprolol 25 mg b.i.d.  PHYSICAL EXAMINATION:  VITAL SIGNS:  On the day of discharge, the patient's vitals include temperature of 97.8, pulse of 76, respirations 18, blood pressure 101/66, saturating 96% on room air. GENERAL:  On exam, she is alert, afebrile, oriented x3, comfortable in no acute distress. CARDIOVASCULAR:  S1 and S2 heard.  Regular rate and rhythm. ABDOMEN:  Soft, nontender, nondistended.  Good bowel sounds. EXTREMITIES:  No pedal edema. RESPIRATORY:  Good air entry bilateral.  No wheezing or rhonchi.  FOLLOWUP: 1. Follow up with [facility] PCP in about 1-2 weeks. 2. Follow with Vascular Surgery in about 4 weeks. 3. Follow up outpatient physical therapy.  The patient at this time is hemodynamically stable for discharge.          ______________________________ Kathlen Mody, MD     VA/MEDQ  D:  10/05/2010  T:  10/06/2010  Job:  [id number]  Electronically Signed by Kathlen Mody MD on 10/15/2010 09:46:11 PM

## 2010-11-11 ENCOUNTER — Ambulatory Visit
Admission: RE | Admit: 2010-11-11 | Discharge: 2010-11-11 | Disposition: A | Discharge status: Home / Self Care | Payer: Medicare Other | Source: Ambulatory Visit | Attending: Vascular Surgery | Admitting: Vascular Surgery

## 2010-11-11 ENCOUNTER — Ambulatory Visit (INDEPENDENT_AMBULATORY_CARE_PROVIDER_SITE_OTHER): Payer: Medicare Other | Admitting: Vascular Surgery

## 2010-11-11 ENCOUNTER — Encounter (INDEPENDENT_AMBULATORY_CARE_PROVIDER_SITE_OTHER): Payer: Medicare Other

## 2010-11-11 DIAGNOSIS — I714 Abdominal aortic aneurysm, without rupture: Secondary | ICD-10-CM

## 2010-11-11 DIAGNOSIS — Z48812 Encounter for surgical aftercare following surgery on the circulatory system: Secondary | ICD-10-CM

## 2010-11-11 MED ORDER — IOHEXOL 350 MG/ML SOLN
100.0000 mL | Freq: Once | INTRAVENOUS | Status: AC | PRN
  Administered 2010-11-11: 100 mL via INTRAVENOUS
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
## 2011-02-12 ENCOUNTER — Other Ambulatory Visit: Payer: Self-pay | Admitting: Vascular Surgery

## 2011-02-12 DIAGNOSIS — I714 Abdominal aortic aneurysm, without rupture: Secondary | ICD-10-CM

## 2011-03-16 ENCOUNTER — Encounter: Payer: Self-pay | Admitting: Vascular Surgery

## 2011-04-29 ENCOUNTER — Other Ambulatory Visit: Payer: Self-pay | Admitting: Vascular Surgery

## 2011-04-29 LAB — BUN: BUN: 19 mg/dL (ref 6–23)

## 2011-05-04 ENCOUNTER — Encounter: Payer: Self-pay | Admitting: Vascular Surgery

## 2011-05-05 ENCOUNTER — Ambulatory Visit (INDEPENDENT_AMBULATORY_CARE_PROVIDER_SITE_OTHER): Payer: Medicare Other | Admitting: Vascular Surgery

## 2011-05-05 ENCOUNTER — Ambulatory Visit
Admission: RE | Admit: 2011-05-05 | Discharge: 2011-05-05 | Disposition: A | Discharge status: Home / Self Care | Payer: Medicare Other | Source: Ambulatory Visit | Attending: Vascular Surgery | Admitting: Vascular Surgery

## 2011-05-05 ENCOUNTER — Encounter: Payer: Self-pay | Admitting: Vascular Surgery

## 2011-05-05 VITALS — BP 166/102 | HR 98 | Temp 97.7°F | Resp 20 | Ht 62.4 in | Wt 137.0 lb

## 2011-05-05 DIAGNOSIS — I714 Abdominal aortic aneurysm, without rupture: Secondary | ICD-10-CM

## 2011-05-05 MED ORDER — IOHEXOL 350 MG/ML SOLN
100.0000 mL | Freq: Once | INTRAVENOUS | Status: AC | PRN
  Administered 2011-05-05: 100 mL via INTRAVENOUS

## 2011-05-05 NOTE — Progress Notes (Signed)
Subjective:     Patient ID: Chelsea Baker, female   DOB: [birth year], 66 y.o.   MRN: [id number]  HPI this 65 year old female returns for continued followup regarding her aortic stent graft repair of an infrarenal abdominal aortic aneurysm performed in April of 2012. She had a Gore-Excluder graft utilized . She denies any abdominal or back symptoms or lower Trinity claudication symptoms since her procedure. Her bowel habits have been regular and she has had no nausea and vomiting. Past Medical History  Diagnosis Date  . GERD (gastroesophageal reflux disease)   . Hiatal hernia   . AAA (abdominal aortic aneurysm)   . Hypertension   . Lumbar spondylolysis   . Diverticulosis   . Hypokalemia     History  Substance Use Topics  . Smoking status: Current Everyday Smoker -- 1.0 packs/day for 50 years    Types: Cigarettes  . Smokeless tobacco: Not on file  . Alcohol Use: Yes     occasionally    Family History  Problem Relation Age of Onset  . Heart disease Mother   . Heart failure Mother   . Heart disease Father   . Coronary artery disease Father     No Known Allergies  Current outpatient prescriptions:aspirin 325 MG EC tablet, Take 325 mg by mouth daily.  , Disp: , Rfl: ;  metoprolol tartrate (LOPRESSOR) 25 MG tablet, Take 25 mg by mouth 2 (two) times daily.  , Disp: , Rfl: ;  oxyCODONE-acetaminophen (PERCOCET) 5-325 MG per tablet, Take 1 tablet by mouth every 4 (four) hours as needed.  , Disp: , Rfl: ;  simvastatin (ZOCOR) 40 MG tablet, Take 40 mg by mouth at bedtime.  , Disp: , Rfl:  No current facility-administered medications for this visit. Facility-Administered Medications Ordered in Other Visits: iohexol (OMNIPAQUE) 350 MG/ML injection 100 mL, 100 mL, Intravenous, Once PRN, Medication Radiologist, 100 mL at 05/05/11 1016  BP 166/102  Pulse 98  Temp(Src) 97.7 F (36.5 C) (Oral)  Resp 20  Ht 5' 2.4" (1.585 m)  Wt 137 lb (62.143 kg)  BMI 24.74 kg/m2  SpO2 97%  Body mass  index is 24.74 kg/(m^2).        Review of Systems she denies chest pain, dyspnea on exertion, PND, orthopnea, lower extremity claudication, palpitations, reflux esophagitis, and urinary symptoms. All systems are negative the complete review of systems     Objective:   Physical Exam blood pressure 166/102 heart rate 80 respirations 14 Gen. she's well-developed well-nourished female no apparent stress HEENT normal for age Lungs no longer wheezing Cardiovascular regular rhythm no murmurs carotid pulses 3+ no audible bruits Abdomen soft nontender no pulsatile masses noted Lower extremity exam reveals 3+ femoral pulses bilaterally with well perfused lower extremities no edema Neurologic normal No musculoskeletal abnormalities noted  Today I ordered a CT angiogram of her stent graft and I reviewed this by computer. The aneurysm diameter has contracted down to 31 mm. There is no evidence of endoleak. She does have coverage of her right internal iliac artery which has been known and is asymptomatic. There has been no migration of the graft.    Assessment:    nicely functioning aortic stent graft for infrarenal abdominal aortic aneurysm-no evidence of endoleak    Plan:     Return in 6 months for duplex scan of aortic aneurysm stent graft in to see Dr. Hart Rochester

## 2011-05-05 NOTE — Progress Notes (Signed)
Addended by: Sharee Pimple on: 05/05/2011 02:23 PM   Modules accepted: Orders

## 2011-11-16 ENCOUNTER — Encounter: Payer: Self-pay | Admitting: Vascular Surgery

## 2011-11-17 ENCOUNTER — Other Ambulatory Visit (INDEPENDENT_AMBULATORY_CARE_PROVIDER_SITE_OTHER): Payer: Medicare Other | Admitting: *Deleted

## 2011-11-17 ENCOUNTER — Encounter: Payer: Self-pay | Admitting: Vascular Surgery

## 2011-11-17 ENCOUNTER — Ambulatory Visit (INDEPENDENT_AMBULATORY_CARE_PROVIDER_SITE_OTHER): Payer: Medicare Other | Admitting: Vascular Surgery

## 2011-11-17 VITALS — BP 165/94 | HR 92 | Resp 20 | Ht 62.0 in | Wt 138.0 lb

## 2011-11-17 DIAGNOSIS — I714 Abdominal aortic aneurysm, without rupture, unspecified: Secondary | ICD-10-CM | POA: Insufficient documentation

## 2011-11-17 DIAGNOSIS — Z48812 Encounter for surgical aftercare following surgery on the circulatory system: Secondary | ICD-10-CM

## 2011-11-17 DIAGNOSIS — I70409 Unspecified atherosclerosis of autologous vein bypass graft(s) of the extremities, unspecified extremity: Secondary | ICD-10-CM

## 2011-11-17 NOTE — Progress Notes (Signed)
Subjective:     Patient ID: Chelsea Baker, female   DOB: 07/07/1945, 66 y.o.   MRN: [id number]  HPI this 66 year old female returns for continued followup regarding her aortic stent graft placed in April of 2012 or an infrarenal abdominal aortic aneurysm. She has had no abdominal or new back symptoms since I last saw her 6 months ago. Her bowel habits are normal. She ambulates long distances. She continues to work her normal job. She does occasionally have some right back discomfort from known lumbar spine disease.  Past Medical History  Diagnosis Date  . GERD (gastroesophageal reflux disease)   . Hiatal hernia   . AAA (abdominal aortic aneurysm)   . Hypertension   . Lumbar spondylolysis   . Diverticulosis   . Hypokalemia     History  Substance Use Topics  . Smoking status: Current Everyday Smoker -- 1.0 packs/day for 50 years    Types: Cigarettes  . Smokeless tobacco: Not on file  . Alcohol Use: Yes     occasionally    Family History  Problem Relation Age of Onset  . Heart disease Mother   . Heart failure Mother   . Heart disease Father   . Coronary artery disease Father     No Known Allergies  Current outpatient prescriptions:aspirin 325 MG EC tablet, Take 325 mg by mouth daily.  , Disp: , Rfl: ;  dextromethorphan-guaiFENesin (MUCINEX DM) 30-600 MG per 12 hr tablet, Take 1 tablet by mouth every 12 (twelve) hours as needed., Disp: , Rfl: ;  diphenhydrAMINE (SOMINEX) 25 MG tablet, Take 25 mg by mouth as needed., Disp: , Rfl: ;  metoprolol tartrate (LOPRESSOR) 25 MG tablet, Take 25 mg by mouth 2 (two) times daily.  , Disp: , Rfl:  oxyCODONE-acetaminophen (PERCOCET) 5-325 MG per tablet, Take 1 tablet by mouth every 4 (four) hours as needed.  , Disp: , Rfl: ;  simvastatin (ZOCOR) 40 MG tablet, Take 40 mg by mouth at bedtime.  , Disp: , Rfl:   BP 165/94  Pulse 92  Resp 20  Ht 5\' 2"  (1.575 m)  Wt 138 lb (62.596 kg)  BMI 25.24 kg/m2  Body mass index is 25.24  kg/(m^2).          Review of Systems denies chest pain, dyspnea on exertion, PND, orthopnea, hemoptysis,    Objective:   Physical Exam blood pressure 165/94 heart rate 92 respirations 20 Gen.-alert and oriented x3 in no apparent distress HEENT normal for age Lungs no rhonchi or wheezing Cardiovascular regular rhythm no murmurs carotid pulses 3+ palpable no bruits audible Abdomen soft nontender no palpable masses Musculoskeletal free of  major deformities Skin clear -no rashes Neurologic normal Lower extremities 3+ femoral and dorsalis pedis pulses palpable bilaterally with no edema  Today I ordered a duplex scan of her abdominal aorta which I reviewed and interpreted. The sac size of the aneurysm continues to be approximately 3.4 cm. There is no evidence of endoleak  On CTA done in August of 2012 the sac size was 3.2 cm. The right hypogastric appears covered by the stent graft with no symptoms of hip claudication    Assessment:     Doing well 14 months post aortic stent graft for infrarenal abdominal aortic aneurysm-no evidence of end oh liter    Plan:     Return in one year with CT angiogram of the abdominal aorta for stent graft followup

## 2011-11-18 NOTE — Progress Notes (Signed)
Addended by: Sharee Pimple on: 11/18/2011 10:09 AM   Modules accepted: Orders

## 2011-11-24 NOTE — Procedures (Unsigned)
VASCULAR LAB EXAM  INDICATION:  Follow up AAA endograft placed 10/01/2010.  HISTORY: Infrarenal abdominal aortic aneurysm repair by Dr. Hart Rochester. Diabetes:  No. Cardiac:  No. Hypertension:  No.  EXAM:       AAA sac size                  2.60 cm AP,  3.40 cm transverse. Previous sac size 02/12/2011 by CT angio  2.7 cm AP,   3.2 cm transverse.  IMPRESSION: 1. The aorta and endograft appear patent. 2. No significant change in size of the aneurysmal sac surrounding the     endograft. 3. No evidence of endoleak was detected.    ___________________________________________ Quita Skye. Hart Rochester, M.D.  SS/MEDQ  D:  11/17/2011  T:  11/17/2011  Job:  [id number]

## 2012-11-08 ENCOUNTER — Other Ambulatory Visit: Payer: Self-pay | Admitting: Vascular Surgery

## 2012-11-08 LAB — CREATININE, SERUM: Creat: 0.86 mg/dL (ref 0.50–1.10)

## 2012-11-08 LAB — BUN: BUN: 10 mg/dL (ref 6–23)

## 2012-11-14 ENCOUNTER — Encounter: Payer: Self-pay | Admitting: Vascular Surgery

## 2012-11-15 ENCOUNTER — Ambulatory Visit
Admission: RE | Admit: 2012-11-15 | Discharge: 2012-11-15 | Disposition: A | Discharge status: Home / Self Care | Payer: Medicare Other | Source: Ambulatory Visit | Attending: Vascular Surgery | Admitting: Vascular Surgery

## 2012-11-15 ENCOUNTER — Encounter: Payer: Self-pay | Admitting: Vascular Surgery

## 2012-11-15 ENCOUNTER — Ambulatory Visit (INDEPENDENT_AMBULATORY_CARE_PROVIDER_SITE_OTHER): Payer: Medicare Other | Admitting: Vascular Surgery

## 2012-11-15 VITALS — BP 154/99 | HR 96 | Resp 16 | Ht 62.0 in | Wt 133.0 lb

## 2012-11-15 DIAGNOSIS — Z48812 Encounter for surgical aftercare following surgery on the circulatory system: Secondary | ICD-10-CM

## 2012-11-15 DIAGNOSIS — I714 Abdominal aortic aneurysm, without rupture: Secondary | ICD-10-CM

## 2012-11-15 HISTORY — DX: Encounter for surgical aftercare following surgery on the circulatory system: Z48.812

## 2012-11-15 MED ORDER — IOHEXOL 350 MG/ML SOLN
100.0000 mL | Freq: Once | INTRAVENOUS | Status: AC | PRN
  Administered 2012-11-15: 100 mL via INTRAVENOUS

## 2012-11-15 NOTE — Addendum Note (Signed)
Addended by: Sharee Pimple on: 11/15/2012 01:57 PM   Modules accepted: Orders

## 2012-11-15 NOTE — Progress Notes (Signed)
Subjective:     Patient ID: Chelsea Baker, female   DOB: 1946/04/18, 67 y.o.   MRN: [id number]  HPI this 66 year old female is 2 years post aortic stent grafting for infrarenal abdominal aortic aneurysm-aorto by common iliac-C3 Gore graft. She denies any abdominal or back pain. No change in her bowel habits. She continues to be very active working in a [employer].  Past Medical History  Diagnosis Date  . GERD (gastroesophageal reflux disease)   . Hiatal hernia   . AAA (abdominal aortic aneurysm)   . Hypertension   . Lumbar spondylolysis   . Diverticulosis   . Hypokalemia     History  Substance Use Topics  . Smoking status: Current Every Day Smoker -- 1.00 packs/day for 50 years    Types: Cigarettes  . Smokeless tobacco: Never Used  . Alcohol Use: Yes     Comment: occasionally    Family History  Problem Relation Age of Onset  . Heart disease Mother   . Heart failure Mother   . Heart disease Father   . Coronary artery disease Father     No Known Allergies  Current outpatient prescriptions:aspirin 325 MG EC tablet, Take 325 mg by mouth daily.  , Disp: , Rfl: ;  diphenhydrAMINE (BENADRYL) 12.5 MG chewable tablet, Chew 12.5 mg by mouth as needed for allergies., Disp: , Rfl: ;  dextromethorphan-guaiFENesin (MUCINEX DM) 30-600 MG per 12 hr tablet, Take 1 tablet by mouth every 12 (twelve) hours as needed., Disp: , Rfl: ;  diphenhydrAMINE (SOMINEX) 25 MG tablet, Take 25 mg by mouth as needed., Disp: , Rfl:  metoprolol tartrate (LOPRESSOR) 25 MG tablet, Take 25 mg by mouth 2 (two) times daily.  , Disp: , Rfl: ;  oxyCODONE-acetaminophen (PERCOCET) 5-325 MG per tablet, Take 1 tablet by mouth every 4 (four) hours as needed.  , Disp: , Rfl: ;  simvastatin (ZOCOR) 40 MG tablet, Take 40 mg by mouth at bedtime.  , Disp: , Rfl:   BP 154/99  Pulse 96  Resp 16  Ht 5\' 2"  (1.575 m)  Wt 133 lb (60.328 kg)  BMI 24.32 kg/m2  SpO2 97%  Body mass index is 24.32  kg/(m^2).           Review of Systems denies chest pain, dyspnea on exertion, PND, orthopnea, claudication, lateralizing weakness,-all systems negative and complete review of systems    Objective:   Physical Exam blood pressure 124/99 heart rate 96 respirations 16 Gen.-alert and oriented x3 in no apparent distress HEENT normal for age Lungs no rhonchi or wheezing Cardiovascular regular rhythm no murmurs carotid pulses 3+ palpable no bruits audible Abdomen soft nontender no palpable masses Musculoskeletal free of  major deformities Skin clear -no rashes Neurologic normal Lower extremities 3+ femoral and dorsalis pedis pulses palpable bilaterally with no edema  They have ordered a CT angiogram of the abdomen and pelvis which are viewed the computer. There is no evidence of endoleak. The aneurysm sac size continues to contract down and is only approximately 3.5 cm in maximum diameter.       Assessment:     2 years status post endovascular stent graft repair of abdominal aortic aneurysm with good result    Plan:     Return in one year with a duplex scan of aortic stent graft.

## 2012-11-22 ENCOUNTER — Ambulatory Visit: Payer: Self-pay | Admitting: Vascular Surgery

## 2013-11-17 ENCOUNTER — Encounter: Payer: Self-pay | Admitting: Vascular Surgery

## 2013-11-21 ENCOUNTER — Other Ambulatory Visit (HOSPITAL_COMMUNITY): Payer: Self-pay

## 2013-11-21 ENCOUNTER — Ambulatory Visit: Payer: Self-pay | Admitting: Vascular Surgery

## 2014-04-09 ENCOUNTER — Encounter: Payer: Self-pay | Admitting: Vascular Surgery

## 2014-04-10 ENCOUNTER — Ambulatory Visit (HOSPITAL_COMMUNITY)
Admission: RE | Admit: 2014-04-10 | Discharge: 2014-04-10 | Disposition: A | Discharge status: Home / Self Care | Payer: Medicare Other | Source: Ambulatory Visit | Attending: Vascular Surgery | Admitting: Vascular Surgery

## 2014-04-10 ENCOUNTER — Encounter: Payer: Self-pay | Admitting: Vascular Surgery

## 2014-04-10 ENCOUNTER — Ambulatory Visit (INDEPENDENT_AMBULATORY_CARE_PROVIDER_SITE_OTHER): Payer: Medicare Other | Admitting: Vascular Surgery

## 2014-04-10 VITALS — BP 169/63 | HR 88 | Ht 62.0 in | Wt 137.3 lb

## 2014-04-10 DIAGNOSIS — Z48812 Encounter for surgical aftercare following surgery on the circulatory system: Secondary | ICD-10-CM | POA: Insufficient documentation

## 2014-04-10 DIAGNOSIS — I714 Abdominal aortic aneurysm, without rupture, unspecified: Secondary | ICD-10-CM | POA: Insufficient documentation

## 2014-04-10 HISTORY — DX: Abdominal aortic aneurysm, without rupture: I71.4

## 2014-04-10 HISTORY — DX: Encounter for surgical aftercare following surgery on the circulatory system: Z48.812

## 2014-04-10 HISTORY — DX: Abdominal aortic aneurysm, without rupture, unspecified: I71.40

## 2014-04-10 NOTE — Progress Notes (Signed)
Subjective:     Patient ID: Chelsea Baker, female   DOB: 02-01-1946, 68 y.o.   MRN: [id number]  HPI this 68 year old female returns for continued followup regarding her aortic stent graft repair performed in April of 2012 for an infrarenal abdominal aortic aneurysm. She has done very well since that time with no abdominal or back symptoms. She does continue to smoke one pack of cigarettes per day and has been unable to stop. She denies any claudication symptoms.  Past Medical History  Diagnosis Date  . GERD (gastroesophageal reflux disease)   . Hiatal hernia   . AAA (abdominal aortic aneurysm)   . Hypertension   . Lumbar spondylolysis   . Diverticulosis   . Hypokalemia     History  Substance Use Topics  . Smoking status: Current Every Day Smoker -- 1.00 packs/day for 50 years    Types: Cigarettes  . Smokeless tobacco: Never Used  . Alcohol Use: Yes     Comment: occasionally    Family History  Problem Relation Age of Onset  . Heart disease Mother   . Heart failure Mother   . Heart disease Father   . Coronary artery disease Father     No Known Allergies  Current outpatient prescriptions:aspirin 325 MG EC tablet, Take 325 mg by mouth daily.  , Disp: , Rfl: ;  dextromethorphan-guaiFENesin (MUCINEX DM) 30-600 MG per 12 hr tablet, Take 1 tablet by mouth every 12 (twelve) hours as needed., Disp: , Rfl: ;  diphenhydrAMINE (BENADRYL) 12.5 MG chewable tablet, Chew 12.5 mg by mouth as needed for allergies., Disp: , Rfl: ;  diphenhydrAMINE (SOMINEX) 25 MG tablet, Take 25 mg by mouth as needed., Disp: , Rfl:  metoprolol tartrate (LOPRESSOR) 25 MG tablet, Take 25 mg by mouth 2 (two) times daily.  , Disp: , Rfl: ;  oxyCODONE-acetaminophen (PERCOCET) 5-325 MG per tablet, Take 1 tablet by mouth every 4 (four) hours as needed.  , Disp: , Rfl: ;  simvastatin (ZOCOR) 40 MG tablet, Take 40 mg by mouth at bedtime.  , Disp: , Rfl:   BP 169/63  Pulse 88  Ht 5\' 2"  (1.575 m)  Wt 137 lb 4.8 oz  (62.279 kg)  BMI 25.11 kg/m2  SpO2 96%  Body mass index is 25.11 kg/(m^2).           Review of Systems denies chest pain, dyspnea on exertion. Does have productive cough and asthma. Claudication, lateralizing weakness, dysphasia, amaurosis fugax, diplopia, blurred vision, and syncope. Does have occasional reflux esophagitis. Other systems negative and a complete review of systems other than ongoing tobacco abuse at one pack per day     Objective:   Physical Exam BP 169/63  Pulse 88  Ht 5\' 2"  (1.575 m)  Wt 137 lb 4.8 oz (62.279 kg)  BMI 25.11 kg/m2  SpO2 96%  Gen.-alert and oriented x3 in no apparent distress HEENT normal for age Lungs no rhonchi or wheezing Cardiovascular regular rhythm no murmurs carotid pulses 3+ palpable no bruits audible Abdomen soft nontender no palpable masses Musculoskeletal free of  major deformities Skin clear -no rashes Neurologic normal Lower extremities 3+ femoral and dorsalis pedis pulses palpable bilaterally with no edema  Today I ordered a duplex scan of the abdominal aortic stent graft which are reviewed and interpreted. Maximum diameter of the native aortic sac is 3.13 cm slightly less than previous study. There is no evidence of endoleak.       Assessment:     Doing  well 3-1/2 years post aortic stent graft repair of abdominal aortic aneurysm with no evidence of endoleak and continued contraction of the native aneurysm sac    Plan:     Return one year for repeat duplex scan of aortic stent graft and see nurse practitioner Have recommended that she continue to work on efforts for tobacco cessation

## 2014-04-10 NOTE — Addendum Note (Signed)
Addended by: Dorthula Rue L on: 04/10/2014 02:17 PM   Modules accepted: Orders

## 2015-04-11 ENCOUNTER — Encounter: Payer: Self-pay | Admitting: Family

## 2015-04-16 ENCOUNTER — Other Ambulatory Visit (HOSPITAL_COMMUNITY): Payer: Self-pay

## 2015-04-16 ENCOUNTER — Ambulatory Visit: Payer: Self-pay | Admitting: Family

## 2017-07-08 DIAGNOSIS — R030 Elevated blood-pressure reading, without diagnosis of hypertension: Secondary | ICD-10-CM | POA: Diagnosis not present

## 2017-07-08 DIAGNOSIS — R0602 Shortness of breath: Secondary | ICD-10-CM | POA: Diagnosis not present

## 2017-07-08 DIAGNOSIS — R5383 Other fatigue: Secondary | ICD-10-CM | POA: Diagnosis not present

## 2017-07-12 ENCOUNTER — Encounter (HOSPITAL_COMMUNITY): Payer: Self-pay | Admitting: Emergency Medicine

## 2017-07-12 ENCOUNTER — Other Ambulatory Visit: Payer: Self-pay

## 2017-07-12 ENCOUNTER — Emergency Department (HOSPITAL_COMMUNITY): Payer: PPO

## 2017-07-12 ENCOUNTER — Inpatient Hospital Stay (HOSPITAL_COMMUNITY)
Admission: EM | Admit: 2017-07-12 | Discharge: 2017-07-18 | DRG: 181 | Disposition: A | Discharge status: Home / Self Care | Payer: PPO | Attending: Internal Medicine | Admitting: Internal Medicine

## 2017-07-12 ENCOUNTER — Inpatient Hospital Stay (HOSPITAL_COMMUNITY): Payer: PPO

## 2017-07-12 DIAGNOSIS — E46 Unspecified protein-calorie malnutrition: Secondary | ICD-10-CM | POA: Diagnosis not present

## 2017-07-12 DIAGNOSIS — I361 Nonrheumatic tricuspid (valve) insufficiency: Secondary | ICD-10-CM | POA: Diagnosis not present

## 2017-07-12 DIAGNOSIS — K449 Diaphragmatic hernia without obstruction or gangrene: Secondary | ICD-10-CM | POA: Diagnosis not present

## 2017-07-12 DIAGNOSIS — E785 Hyperlipidemia, unspecified: Secondary | ICD-10-CM | POA: Diagnosis present

## 2017-07-12 DIAGNOSIS — I110 Hypertensive heart disease with heart failure: Secondary | ICD-10-CM | POA: Diagnosis not present

## 2017-07-12 DIAGNOSIS — C3400 Malignant neoplasm of unspecified main bronchus: Secondary | ICD-10-CM | POA: Diagnosis not present

## 2017-07-12 DIAGNOSIS — Z79899 Other long term (current) drug therapy: Secondary | ICD-10-CM

## 2017-07-12 DIAGNOSIS — K5790 Diverticulosis of intestine, part unspecified, without perforation or abscess without bleeding: Secondary | ICD-10-CM | POA: Diagnosis present

## 2017-07-12 DIAGNOSIS — C3490 Malignant neoplasm of unspecified part of unspecified bronchus or lung: Secondary | ICD-10-CM | POA: Diagnosis not present

## 2017-07-12 DIAGNOSIS — J984 Other disorders of lung: Secondary | ICD-10-CM

## 2017-07-12 DIAGNOSIS — R0602 Shortness of breath: Secondary | ICD-10-CM | POA: Diagnosis not present

## 2017-07-12 DIAGNOSIS — R062 Wheezing: Secondary | ICD-10-CM | POA: Diagnosis not present

## 2017-07-12 DIAGNOSIS — R634 Abnormal weight loss: Secondary | ICD-10-CM | POA: Diagnosis not present

## 2017-07-12 DIAGNOSIS — R0600 Dyspnea, unspecified: Secondary | ICD-10-CM | POA: Diagnosis not present

## 2017-07-12 DIAGNOSIS — R918 Other nonspecific abnormal finding of lung field: Secondary | ICD-10-CM

## 2017-07-12 DIAGNOSIS — C3491 Malignant neoplasm of unspecified part of right bronchus or lung: Secondary | ICD-10-CM | POA: Diagnosis not present

## 2017-07-12 DIAGNOSIS — F17210 Nicotine dependence, cigarettes, uncomplicated: Secondary | ICD-10-CM | POA: Diagnosis not present

## 2017-07-12 DIAGNOSIS — R000 Tachycardia, unspecified: Secondary | ICD-10-CM | POA: Diagnosis present

## 2017-07-12 DIAGNOSIS — Z7982 Long term (current) use of aspirin: Secondary | ICD-10-CM

## 2017-07-12 DIAGNOSIS — Z6822 Body mass index (BMI) 22.0-22.9, adult: Secondary | ICD-10-CM

## 2017-07-12 DIAGNOSIS — E871 Hypo-osmolality and hyponatremia: Secondary | ICD-10-CM | POA: Diagnosis not present

## 2017-07-12 DIAGNOSIS — I5042 Chronic combined systolic (congestive) and diastolic (congestive) heart failure: Secondary | ICD-10-CM | POA: Diagnosis not present

## 2017-07-12 DIAGNOSIS — M4306 Spondylolysis, lumbar region: Secondary | ICD-10-CM | POA: Diagnosis present

## 2017-07-12 DIAGNOSIS — Z9889 Other specified postprocedural states: Secondary | ICD-10-CM

## 2017-07-12 DIAGNOSIS — K760 Fatty (change of) liver, not elsewhere classified: Secondary | ICD-10-CM | POA: Diagnosis not present

## 2017-07-12 DIAGNOSIS — R0902 Hypoxemia: Secondary | ICD-10-CM | POA: Diagnosis not present

## 2017-07-12 DIAGNOSIS — F17200 Nicotine dependence, unspecified, uncomplicated: Secondary | ICD-10-CM | POA: Diagnosis not present

## 2017-07-12 DIAGNOSIS — C3401 Malignant neoplasm of right main bronchus: Secondary | ICD-10-CM | POA: Diagnosis not present

## 2017-07-12 DIAGNOSIS — R630 Anorexia: Secondary | ICD-10-CM | POA: Diagnosis not present

## 2017-07-12 DIAGNOSIS — R509 Fever, unspecified: Secondary | ICD-10-CM | POA: Diagnosis not present

## 2017-07-12 DIAGNOSIS — K219 Gastro-esophageal reflux disease without esophagitis: Secondary | ICD-10-CM | POA: Diagnosis present

## 2017-07-12 DIAGNOSIS — I10 Essential (primary) hypertension: Secondary | ICD-10-CM | POA: Diagnosis not present

## 2017-07-12 DIAGNOSIS — R030 Elevated blood-pressure reading, without diagnosis of hypertension: Secondary | ICD-10-CM | POA: Diagnosis not present

## 2017-07-12 DIAGNOSIS — E222 Syndrome of inappropriate secretion of antidiuretic hormone: Secondary | ICD-10-CM | POA: Diagnosis present

## 2017-07-12 DIAGNOSIS — R05 Cough: Secondary | ICD-10-CM | POA: Diagnosis not present

## 2017-07-12 DIAGNOSIS — C3411 Malignant neoplasm of upper lobe, right bronchus or lung: Principal | ICD-10-CM | POA: Diagnosis present

## 2017-07-12 HISTORY — DX: Malignant neoplasm of upper lobe, right bronchus or lung: C34.11

## 2017-07-12 HISTORY — DX: Hypo-osmolality and hyponatremia: E87.1

## 2017-07-12 HISTORY — DX: Tachycardia, unspecified: R00.0

## 2017-07-12 HISTORY — DX: Other disorders of lung: J98.4

## 2017-07-12 LAB — TROPONIN I

## 2017-07-12 LAB — CBC WITH DIFFERENTIAL/PLATELET
BASOS ABS: 0 10*3/uL (ref 0.0–0.1)
Basophils Relative: 0 %
EOS ABS: 0.2 10*3/uL (ref 0.0–0.7)
EOS PCT: 2 %
HCT: 43.6 % (ref 36.0–46.0)
Hemoglobin: 13.9 g/dL (ref 12.0–15.0)
Lymphocytes Relative: 18 %
Lymphs Abs: 1.5 10*3/uL (ref 0.7–4.0)
MCH: 27 pg (ref 26.0–34.0)
MCHC: 31.9 g/dL (ref 30.0–36.0)
MCV: 84.8 fL (ref 78.0–100.0)
Monocytes Absolute: 0.5 10*3/uL (ref 0.1–1.0)
Monocytes Relative: 6 %
Neutro Abs: 6.3 10*3/uL (ref 1.7–7.7)
Neutrophils Relative %: 74 %
PLATELETS: 399 10*3/uL (ref 150–400)
RBC: 5.14 MIL/uL — AB (ref 3.87–5.11)
RDW: 14.7 % (ref 11.5–15.5)
WBC: 8.5 10*3/uL (ref 4.0–10.5)

## 2017-07-12 LAB — BASIC METABOLIC PANEL
ANION GAP: 11 (ref 5–15)
BUN: 12 mg/dL (ref 6–20)
CO2: 30 mmol/L (ref 22–32)
Calcium: 9.9 mg/dL (ref 8.9–10.3)
Chloride: 91 mmol/L — ABNORMAL LOW (ref 101–111)
Creatinine, Ser: 0.86 mg/dL (ref 0.44–1.00)
Glucose, Bld: 117 mg/dL — ABNORMAL HIGH (ref 65–99)
POTASSIUM: 4 mmol/L (ref 3.5–5.1)
SODIUM: 132 mmol/L — AB (ref 135–145)

## 2017-07-12 LAB — URINALYSIS, ROUTINE W REFLEX MICROSCOPIC
BILIRUBIN URINE: NEGATIVE
Glucose, UA: NEGATIVE mg/dL
HGB URINE DIPSTICK: NEGATIVE
Ketones, ur: 5 mg/dL — AB
Nitrite: NEGATIVE
PH: 5 (ref 5.0–8.0)
Protein, ur: 30 mg/dL — AB
SPECIFIC GRAVITY, URINE: 1.013 (ref 1.005–1.030)

## 2017-07-12 LAB — I-STAT TROPONIN, ED: Troponin i, poc: 0.03 ng/mL (ref 0.00–0.08)

## 2017-07-12 LAB — D-DIMER, QUANTITATIVE: D-Dimer, Quant: 2.05 ug/mL-FEU — ABNORMAL HIGH (ref 0.00–0.50)

## 2017-07-12 MED ORDER — IPRATROPIUM-ALBUTEROL 0.5-2.5 (3) MG/3ML IN SOLN
3.0000 mL | Freq: Once | RESPIRATORY_TRACT | Status: AC
  Administered 2017-07-12: 3 mL via RESPIRATORY_TRACT
  Filled 2017-07-12: qty 3

## 2017-07-12 MED ORDER — IOPAMIDOL (ISOVUE-370) INJECTION 76%
INTRAVENOUS | Status: AC
  Filled 2017-07-12: qty 100

## 2017-07-12 MED ORDER — IOPAMIDOL (ISOVUE-300) INJECTION 61%
INTRAVENOUS | Status: AC
  Filled 2017-07-12: qty 75

## 2017-07-12 MED ORDER — ENOXAPARIN SODIUM 60 MG/0.6ML ~~LOC~~ SOLN
1.0000 mg/kg | Freq: Once | SUBCUTANEOUS | Status: AC
  Administered 2017-07-12: 55 mg via SUBCUTANEOUS
  Filled 2017-07-12: qty 0.55

## 2017-07-12 MED ORDER — IOPAMIDOL (ISOVUE-300) INJECTION 61%
75.0000 mL | Freq: Once | INTRAVENOUS | Status: AC | PRN
  Administered 2017-07-12: 75 mL via INTRAVENOUS

## 2017-07-12 NOTE — ED Notes (Signed)
Bed assigned @ 2152 rm 1411

## 2017-07-12 NOTE — ED Provider Notes (Signed)
Patient placed in Quick Look pathway, seen and evaluated.  Chief Complaint: productive cough, fatigue, shortness of breath, sent from UC for low oxygen level and unknown finding on CXR  HPI:  Reports URI symptoms including productive cough, congestion, but also SOB, fatigue for 2 weeks. Fever has resolved. Improvement with breathing treatment at UC.  ROS: productive cough, shortness of breath, negative chest pain  Physical Exam:   Gen: No distress  Neuro: Awake and Alert  Skin: Warm  Focused Exam: mild coarse breath sounds in bilateral lung fields, RRR, no abdominal pain  Initiation of care has begun. The patient has been counseled on the process, plan, and necessity for staying for the completion/evaluation, and the remainder of the medical screening examination   Delia Heady, PA-C 07/12/17 [place], MD 07/12/17 1807

## 2017-07-12 NOTE — H&P (Addendum)
TRH H&P   Patient Demographics:    Chelsea Baker, is a 72 y.o. female  MRN: [id number]   DOB - 14-Mar-1946  Admit Date - 07/12/2017  Outpatient Primary MD for the patient is Karma Greaser, DO  Referring MD/NP/PA:   Franchot Heidelberg  Outpatient Specialists:   Patient coming from: home  Chief Complaint  Patient presents with  . sent from Urgent care      HPI:    Chelsea Baker  is a 72 y.o. female, w Hypertension, AAA, Nicotine dep apparently presents w c/o cough for the past 2 weeks, initially yellow sputum.  Currently dry cough.  Pt notes 20 lbs weight loss this past year.  Notes increase in dyspnea over the past several days.  Pt went to urgent care and was sent to ED for evaluation .   In ED,   Wbc 8,5 Hgb 13.9, Plt 399 Na 132, Bun 12, Creatinine 0.86 Glucose 117  CXR  IMPRESSION: 1. Approximate 3.4 cm right upper lobe lung mass, possibly cavitary. Recommend CT chest for further evaluation.  CT scan chest  IMPRESSION: 1. Approximate 3.5 cm cavitary mass involving the right upper lobe, most likely indicating a primary bronchogenic carcinoma.  2. Indeterminate 4 mm nodule involving the superior segment right lower lobe. Attention to this nodule on subsequent imaging is recommended. 3. No evidence of metastatic disease otherwise involving the chest or upper abdomen. 4. Diffuse hepatic steatosis without focal parenchymal abnormality involving the visualized liver.  Pt will be admitted for cavitary mass in the right upper lung probably primary bronchogenic carcinoma.     Review of systems:    In addition to the HPI above,  No Fever-chills, No Headache, No changes with Vision or hearing, No problems swallowing food or Liquids, No Chest pain, No Abdominal pain, No Nausea or Vommitting, Bowel movements are regular, No Blood in stool or Urine, No dysuria, No  new skin rashes or bruises, No new joints pains-aches,  No new weakness, tingling, numbness in any extremity, No recent weight gain or loss, No polyuria, polydypsia or polyphagia, No significant Mental Stressors.  A full 10 point Review of Systems was done, except as stated above, all other Review of Systems were negative.   With Past History of the following :    Past Medical History:  Diagnosis Date  . AAA (abdominal aortic aneurysm) ([place])   . Diverticulosis   . GERD (gastroesophageal reflux disease)   . Hiatal hernia   . Hypertension   . Hypokalemia   . Lumbar spondylolysis       Past Surgical History:  Procedure Laterality Date  . ABDOMINAL AORTIC ANEURYSM REPAIR  10/01/10   endostent repair      Social History:     Social History   Tobacco Use  . Smoking status: Current Every Day Smoker    Packs/day: 1.00    Years:  50.00    Pack years: 50.00    Types: Cigarettes  . Smokeless tobacco: Never Used  Substance Use Topics  . Alcohol use: Yes    Comment: occasionally     Lives -  At home Mobility - walks by self   Family History :     Family History  Problem Relation Age of Onset  . Heart disease Mother   . Heart failure Mother   . Heart disease Father   . Coronary artery disease Father      Home Medications:   Prior to Admission medications   Medication Sig Start Date End Date Taking? Authorizing Provider  acetaminophen (TYLENOL) 500 MG tablet Take 500-1,000 mg by mouth every 6 (six) hours as needed for mild pain, moderate pain or headache.   Yes [provider]  aspirin 325 MG EC tablet Take 325 mg by mouth as needed for pain.    Yes [provider]  diphenhydrAMINE (BENADRYL) 25 MG tablet Take 25 mg by mouth as needed for allergies.    Yes [provider]  dextromethorphan-guaiFENesin (MUCINEX DM) 30-600 MG per 12 hr tablet Take 1 tablet by mouth every 12 (twelve) hours as needed.    [provider]    diphenhydrAMINE (SOMINEX) 25 MG tablet Take 25 mg by mouth as needed.    [provider]  metoprolol tartrate (LOPRESSOR) 25 MG tablet Take 25 mg by mouth 2 (two) times daily.      [provider]  oxyCODONE-acetaminophen (PERCOCET) 5-325 MG per tablet Take 1 tablet by mouth every 4 (four) hours as needed.      [provider]  simvastatin (ZOCOR) 40 MG tablet Take 40 mg by mouth at bedtime.      [provider]     Allergies:    No Known Allergies   Physical Exam:   Vitals  Blood pressure (!) 171/112, pulse 96, temperature 98.1 F (36.7 C), temperature source Oral, resp. rate 17, height _0  (1.575 m), weight 52.7 kg (116 lb 4 oz), SpO2 93 %.   1. General  lying in bed in NAD,    2. Normal affect and insight, Not Suicidal or Homicidal, Awake Alert, Oriented X 3.  3. No F.N deficits, ALL C.Nerves Intact, Strength 5/5 all 4 extremities, Sensation intact all 4 extremities, Plantars down going.  4. Ears and Eyes appear Normal, Conjunctivae clear, PERRLA. Moist Oral Mucosa.  5. Supple Neck, No JVD, No cervical lymphadenopathy appriciated, No Carotid Bruits.  6. Symmetrical Chest wall movement, Good air movement bilaterally, CTAB.  7. Borderline tachy s1, s,2,   No Gallops, Rubs or Murmurs, No Parasternal Heave.  8. Positive Bowel Sounds, Abdomen Soft, No tenderness, No organomegaly appriciated,No rebound -guarding or rigidity.  9.  No Cyanosis, Normal Skin Turgor, No Skin Rash or Bruise.  10. Good muscle tone,  joints appear normal , no effusions, Normal ROM.  11. No Palpable Lymph Nodes in Neck or Axillae     Data Review:    CBC Recent Labs  Lab 07/12/17 1434  WBC 8.5  HGB 13.9  HCT 43.6  PLT 399  MCV 84.8  MCH 27.0  MCHC 31.9  RDW 14.7  LYMPHSABS 1.5  MONOABS 0.5  EOSABS 0.2  BASOSABS 0.0   ------------------------------------------------------------------------------------------------------------------  Chemistries   Recent Labs  Lab 07/12/17 1434  NA 132*  K 4.0  CL 91*  CO2 30  GLUCOSE 117*  BUN 12  CREATININE 0.86  CALCIUM 9.9   ------------------------------------------------------------------------------------------------------------------  estimated creatinine clearance is 47.5 mL/min (by C-G formula based on SCr of 0.86 mg/dL). ------------------------------------------------------------------------------------------------------------------ No results for input(s): TSH, T4TOTAL, T3FREE, THYROIDAB in the last 72 hours.  Invalid input(s): FREET3  Coagulation profile No results for input(s): INR, PROTIME in the last 168 hours. ------------------------------------------------------------------------------------------------------------------- Recent Labs    07/12/17 2054  DDIMER 2.05*   -------------------------------------------------------------------------------------------------------------------  Cardiac Enzymes No results for input(s): CKMB, TROPONINI, MYOGLOBIN in the last 168 hours.  Invalid input(s): CK ------------------------------------------------------------------------------------------------------------------ No results found for: BNP   ---------------------------------------------------------------------------------------------------------------  Urinalysis    Component Value Date/Time   COLORURINE YELLOW 07/12/2017 [place] 07/12/2017 1304   LABSPEC 1.013 07/12/2017 1304   PHURINE 5.0 07/12/2017 1304   GLUCOSEU NEGATIVE 07/12/2017 1304   HGBUR NEGATIVE 07/12/2017 1304   BILIRUBINUR NEGATIVE 07/12/2017 1304   KETONESUR 5 (A) 07/12/2017 1304   PROTEINUR 30 (A) 07/12/2017 1304   UROBILINOGEN 0.2 09/25/2010 0357   NITRITE NEGATIVE 07/12/2017 1304   LEUKOCYTESUR TRACE (A) 07/12/2017 1304    ----------------------------------------------------------------------------------------------------------------   Imaging Results:    Dg Chest 2  View  Result Date: 07/12/2017 CLINICAL DATA:  Productive cough, fatigue and short of breath EXAM: CHEST  2 VIEW COMPARISON:  10/12/2010 radiograph FINDINGS: Approximate 3.4 cm right upper lobe lung mass, possibly with cavitation on the lateral view. No pleural effusion. Normal heart size. Aortic atherosclerosis. No pneumothorax. Partially visualized distal aortic stent. IMPRESSION: 1. Approximate 3.4 cm right upper lobe lung mass, possibly cavitary. Recommend CT chest for further evaluation. Electronically Signed   By: Donavan Foil M.D.   On: 07/12/2017 14:06   Ct Chest W Contrast  Result Date: 07/12/2017 CLINICAL DATA:  Right upper lobe lung mass on chest x-ray earlier today. Patient presented to the emergency department with productive cough, fatigue and shortness of breath. EXAM: CT CHEST WITH CONTRAST TECHNIQUE: Multidetector CT imaging of the chest was performed during intravenous contrast administration. CONTRAST:  70m ISOVUE-300 IOPAMIDOL INJECTION 61% IV. COMPARISON:  No prior CT.  Chest x-ray earlier today and 10/12/2010. FINDINGS: Cardiovascular: Normal heart size. Mild three-vessel coronary atherosclerosis. No pericardial effusion. Severe atherosclerosis involving the thoracic and upper abdominal aorta. The extreme proximal portion of the abdominal aortic stent graft is visible and is patent. Mediastinum/Nodes: Normal sized mediastinal and hilar lymph nodes, the largest a right paratracheal node (station 4R) measuring approximately 1.6 x 1.0 cm. Gas-filled esophagus. No evidence of hiatal hernia. Thyroid gland unremarkable. Lungs/Pleura: Spiculated mass involving the right upper lobe with maximum measurements approximating 3.5 x 3.0 x 3.5 cm, cavitary with gas and fluid centrally. Approximate 0.4 x 0.3 cm nodule involving the superior segment right lower lobe (series 5, image 63). No nodules or masses elsewhere in either lung. Emphysematous changes throughout both lungs. Scar/bronchiectasis  involving the lingula. No pleural effusions. Upper Abdomen: Diffuse hepatic steatosis without focal parenchymal abnormality involving the visualized liver. Visualized upper abdomen otherwise unremarkable. Musculoskeletal: Degenerative disc disease and spondylosis involving the thoracic spine in the visualized lower cervical spine. No acute osseous abnormality. No evidence of osseous metastatic disease. IMPRESSION: 1. Approximate 3.5 cm cavitary mass involving the right upper lobe, most likely indicating a primary bronchogenic carcinoma. 2. Indeterminate 4 mm nodule involving the superior segment right lower lobe. Attention to this nodule on subsequent imaging is recommended. 3. No evidence of metastatic disease otherwise involving the chest or upper abdomen. 4. Diffuse hepatic steatosis without focal parenchymal abnormality involving the visualized liver. Aortic Atherosclerosis (ICD10-I70.0) and Emphysema (ICD10-J43.9). Electronically Signed   By: TMarcello Moores  Lawrence M.D.   On: 07/12/2017 16:31      Assessment & Plan:    Principal Problem:   Lung cancer ([place]) Active Problems:   Tachycardia   Hyponatremia    Cavitary lung lesion, ddx malignancy, TB, [employer] x3 Respiratory isolation, negative pressure room Pulmonary consulted ,I contacted EICU for AM consult Recommended isolation  Tachycardia  D dimer, trop I q6h x3 tsh  If d dimer is positive then CTA chest in Am and lovenox 93m /kg [place] x1  Hyponatremia Hydrate with ns iv  Nicotine dep Nicotine patch prn    DVT Prophylaxis  Lovenox - SCDs  AM Labs Ordered, also please review Full Orders  Family Communication: Admission, patients condition and plan of care including tests being ordered have been discussed with the patient  who indicate understanding and agree with the plan and Code Status.  Code Status FULL CODE  Likely DC to  home  Condition GUARDED    Consults called: pulmonary consult  Admission status: inpatient  Time  spent in minutes : 45   JJani GravelM.D on 07/12/2017 at 9:58 PM  Between 7am to 7pm - Pager - [phone] . After 7pm go to [email or web address] - password [facility] - Office  [phone]

## 2017-07-12 NOTE — ED Triage Notes (Signed)
Patient went to Urgent care on Thursday and was told to go to ED but patient didn't. Went back to Urgent care today for work note and was told that she had masss or something show up on her chest xray and her O2 was low today so was given neb treatment and sent to ED.

## 2017-07-12 NOTE — ED Provider Notes (Signed)
[facility] DEPT Provider Note   CSN: [id number] Arrival date & time: 07/12/17  1239     History   Chief Complaint Chief Complaint  Patient presents with  . sent from Urgent care    HPI Chelsea Baker is a 72 y.o. female presenting for evaluation of lung mass and hypoxia.  Patient states that for the past 2-3 weeks, she has had shortness of breath, cough, and been feeling poorly.  Initially she had fevers, but these resolved after a few days.  She reports chest pressure/tightness, which improved slightly after breathing treatment.  She denies history of COPD, asthma, or other respiratory problem.  She denies nasal congestion, ear pain, sore throat, chest pain, nausea, vomiting, abdominal pain, urinary symptoms, abnormal bowel movements, or leg pain or swelling.  She denies any medical problems, does not take medications daily.  She denies history of similar.  She reports that when she was first sick, her 81 year old son also was sick, but he is improved.  She smokes a pack a day, has a 40 to 50-pack-year history.   Chelsea Baker states she does not have a PCP (she has not seen Dr Alroy Dust for years).   HPI  Past Medical History:  Diagnosis Date  . AAA (abdominal aortic aneurysm) ([place])   . Diverticulosis   . GERD (gastroesophageal reflux disease)   . Hiatal hernia   . Hypertension   . Hypokalemia   . Lumbar spondylolysis     Patient Active Problem List   Diagnosis Date Noted  . Lung cancer ([place]) 07/12/2017  . AAA (abdominal aortic aneurysm) without rupture ([place]) 04/10/2014  . Aftercare following surgery of the circulatory system 04/10/2014  . Aftercare following surgery of the circulatory system, [place] 11/15/2012  . Abdominal aneurysm without mention of rupture 11/17/2011    Past Surgical History:  Procedure Laterality Date  . ABDOMINAL AORTIC ANEURYSM REPAIR  10/01/10   endostent repair    OB History    No data available       Home  Medications    Prior to Admission medications   Medication Sig Start Date End Date Taking? Authorizing Provider  acetaminophen (TYLENOL) 500 MG tablet Take 500-1,000 mg by mouth every 6 (six) hours as needed for mild pain, moderate pain or headache.   Yes [provider]  aspirin 325 MG EC tablet Take 325 mg by mouth as needed for pain.    Yes [provider]  diphenhydrAMINE (BENADRYL) 25 MG tablet Take 25 mg by mouth as needed for allergies.    Yes [provider]  dextromethorphan-guaiFENesin (MUCINEX DM) 30-600 MG per 12 hr tablet Take 1 tablet by mouth every 12 (twelve) hours as needed.    [provider]  diphenhydrAMINE (SOMINEX) 25 MG tablet Take 25 mg by mouth as needed.    [provider]  metoprolol tartrate (LOPRESSOR) 25 MG tablet Take 25 mg by mouth 2 (two) times daily.      [provider]  oxyCODONE-acetaminophen (PERCOCET) 5-325 MG per tablet Take 1 tablet by mouth every 4 (four) hours as needed.      [provider]  simvastatin (ZOCOR) 40 MG tablet Take 40 mg by mouth at bedtime.      [provider]    Family History Family History  Problem Relation Age of Onset  . Heart disease Mother   . Heart failure Mother   . Heart disease Father   . Coronary artery disease Father  Social History Social History   Tobacco Use  . Smoking status: Current Every Day Smoker    Packs/day: 1.00    Years: 50.00    Pack years: 50.00    Types: Cigarettes  . Smokeless tobacco: Never Used  Substance Use Topics  . Alcohol use: Yes    Comment: occasionally  . Drug use: No     Allergies   Patient has no known allergies.   Review of Systems Review of Systems  Constitutional: Positive for fever (resolved). Negative for chills.  HENT: Negative for congestion.   Respiratory: Positive for cough, chest tightness and shortness of breath.   Cardiovascular: Negative for chest pain, palpitations and leg  swelling.  Gastrointestinal: Negative for abdominal pain, nausea and vomiting.  Genitourinary: Negative for dysuria, frequency and hematuria.  Musculoskeletal: Negative for back pain.  Skin: Negative for wound.  Allergic/Immunologic: Negative for immunocompromised state.  Neurological: Negative for headaches.  Hematological: Does not bruise/bleed easily.  Psychiatric/Behavioral: Negative for confusion.     Physical Exam Updated Vital Signs BP (!) 166/94   Pulse (!) 101   Temp 98.1 F (36.7 C) (Oral)   Resp 15   Ht 5\' 2"  (1.575 m)   Wt 52.7 kg (116 lb 4 oz)   SpO2 93%   BMI 21.26 kg/m   Physical Exam  Constitutional: She is oriented to person, place, and time. She appears well-developed and well-nourished. No distress.  HENT:  Head: Normocephalic and atraumatic.  Nose: Nose normal.  Mouth/Throat: Uvula is midline, oropharynx is clear and moist and mucous membranes are normal.  Eyes: Conjunctivae and EOM are normal. Pupils are equal, round, and reactive to light.  Neck: Normal range of motion. Neck supple.  Cardiovascular: Normal rate, regular rhythm and intact distal pulses.  Pulmonary/Chest: Effort normal and breath sounds normal. No respiratory distress. She has no wheezes. She exhibits no tenderness.  Abdominal: Soft. She exhibits no distension and no mass. There is no tenderness. There is no guarding.  Musculoskeletal: Normal range of motion.  Lymphadenopathy:    She has no cervical adenopathy.  Neurological: She is alert and oriented to person, place, and time.  Skin: Skin is warm and dry.  Psychiatric: She has a normal mood and affect.  Nursing note and vitals reviewed.    ED Treatments / Results  Labs (all labs ordered are listed, but only abnormal results are displayed) Labs Reviewed  CBC WITH DIFFERENTIAL/PLATELET - Abnormal; Notable for the following components:      Result Value   RBC 5.14 (*)    All other components within normal limits  BASIC  METABOLIC PANEL - Abnormal; Notable for the following components:   Sodium 132 (*)    Chloride 91 (*)    Glucose, Bld 117 (*)    All other components within normal limits  URINALYSIS, ROUTINE W REFLEX MICROSCOPIC - Abnormal; Notable for the following components:   Ketones, ur 5 (*)    Protein, ur 30 (*)    Leukocytes, UA TRACE (*)    Bacteria, UA RARE (*)    Squamous Epithelial / LPF 0-5 (*)    All other components within normal limits  I-STAT TROPONIN, ED    EKG  EKG Interpretation None       Radiology Dg Chest 2 View  Result Date: 07/12/2017 CLINICAL DATA:  Productive cough, fatigue and short of breath EXAM: CHEST  2 VIEW COMPARISON:  10/12/2010 radiograph FINDINGS: Approximate 3.4 cm right upper lobe lung mass, possibly with cavitation  on the lateral view. No pleural effusion. Normal heart size. Aortic atherosclerosis. No pneumothorax. Partially visualized distal aortic stent. IMPRESSION: 1. Approximate 3.4 cm right upper lobe lung mass, possibly cavitary. Recommend CT chest for further evaluation. Electronically Signed   By: Donavan Foil M.D.   On: 07/12/2017 14:06   Ct Chest W Contrast  Result Date: 07/12/2017 CLINICAL DATA:  Right upper lobe lung mass on chest x-ray earlier today. Patient presented to the emergency department with productive cough, fatigue and shortness of breath. EXAM: CT CHEST WITH CONTRAST TECHNIQUE: Multidetector CT imaging of the chest was performed during intravenous contrast administration. CONTRAST:  3mL ISOVUE-300 IOPAMIDOL INJECTION 61% IV. COMPARISON:  No prior CT.  Chest x-ray earlier today and 10/12/2010. FINDINGS: Cardiovascular: Normal heart size. Mild three-vessel coronary atherosclerosis. No pericardial effusion. Severe atherosclerosis involving the thoracic and upper abdominal aorta. The extreme proximal portion of the abdominal aortic stent graft is visible and is patent. Mediastinum/Nodes: Normal sized mediastinal and hilar lymph nodes, the  largest a right paratracheal node (station 4R) measuring approximately 1.6 x 1.0 cm. Gas-filled esophagus. No evidence of hiatal hernia. Thyroid gland unremarkable. Lungs/Pleura: Spiculated mass involving the right upper lobe with maximum measurements approximating 3.5 x 3.0 x 3.5 cm, cavitary with gas and fluid centrally. Approximate 0.4 x 0.3 cm nodule involving the superior segment right lower lobe (series 5, image 63). No nodules or masses elsewhere in either lung. Emphysematous changes throughout both lungs. Scar/bronchiectasis involving the lingula. No pleural effusions. Upper Abdomen: Diffuse hepatic steatosis without focal parenchymal abnormality involving the visualized liver. Visualized upper abdomen otherwise unremarkable. Musculoskeletal: Degenerative disc disease and spondylosis involving the thoracic spine in the visualized lower cervical spine. No acute osseous abnormality. No evidence of osseous metastatic disease. IMPRESSION: 1. Approximate 3.5 cm cavitary mass involving the right upper lobe, most likely indicating a primary bronchogenic carcinoma. 2. Indeterminate 4 mm nodule involving the superior segment right lower lobe. Attention to this nodule on subsequent imaging is recommended. 3. No evidence of metastatic disease otherwise involving the chest or upper abdomen. 4. Diffuse hepatic steatosis without focal parenchymal abnormality involving the visualized liver. Aortic Atherosclerosis (ICD10-I70.0) and Emphysema (ICD10-J43.9). Electronically Signed   By: Evangeline Dakin M.D.   On: 07/12/2017 16:31    Procedures Procedures (including critical care time)  Medications Ordered in ED Medications  iopamidol (ISOVUE-300) 61 % injection (not administered)  ipratropium-albuterol (DUONEB) 0.5-2.5 (3) MG/3ML nebulizer solution 3 mL (3 mLs Nebulization Given 07/12/17 1616)  iopamidol (ISOVUE-300) 61 % injection 75 mL (75 mLs Intravenous Contrast Given 07/12/17 1608)     Initial Impression /  Assessment and Plan / ED Course  I have reviewed the triage vital signs and the nursing notes.  Pertinent labs & imaging results that were available during my care of the patient were reviewed by me and considered in my medical decision making (see chart for details).     Presenting for evaluation of shortness of breath and abnormal chest x-ray.  Physical exam shows patient remains slightly hypoxic around 90% at rest.  Slightly tachycardic around 100.  X-ray shows lesion of the right upper field.  Blood work otherwise reassuring.  Will obtain CT chest for further evaluation.  CT chest shows a 3.5 x 3 x 3.5 lesion of the right upper field, likely cancerous.  While ambulating, patient stats remained stable at 90%.  No leg pain or swelling.  Doubt PE (Ct with contrast did not see anything either). Discussed with attending, Dr. Alvino Chapel evaluated the  Chelsea Baker.   Discussed with Dr. Maudie Mercury, Chelsea Baker to be admitted to the hospitalist service for further workup of lesion, as Chelsea Baker has persistent SOB and mild hypoxia.   Final Clinical Impressions(s) / ED Diagnoses   Final diagnoses:  Mass of upper lobe of right lung  Hypoxia    ED Discharge Orders    None       Franchot Heidelberg, PA-C 07/12/17 1945    Davonna Belling, MD 07/13/17 [phone]

## 2017-07-13 ENCOUNTER — Encounter (HOSPITAL_COMMUNITY): Payer: Self-pay | Admitting: Radiology

## 2017-07-13 ENCOUNTER — Inpatient Hospital Stay (HOSPITAL_COMMUNITY): Payer: PPO

## 2017-07-13 DIAGNOSIS — J984 Other disorders of lung: Secondary | ICD-10-CM

## 2017-07-13 DIAGNOSIS — C3491 Malignant neoplasm of unspecified part of right bronchus or lung: Secondary | ICD-10-CM

## 2017-07-13 LAB — COMPREHENSIVE METABOLIC PANEL
ALT: 12 U/L — ABNORMAL LOW (ref 14–54)
AST: 20 U/L (ref 15–41)
Albumin: 3.6 g/dL (ref 3.5–5.0)
Alkaline Phosphatase: 72 U/L (ref 38–126)
Anion gap: 9 (ref 5–15)
BUN: 12 mg/dL (ref 6–20)
CHLORIDE: 94 mmol/L — AB (ref 101–111)
CO2: 29 mmol/L (ref 22–32)
Calcium: 9.2 mg/dL (ref 8.9–10.3)
Creatinine, Ser: 0.81 mg/dL (ref 0.44–1.00)
GFR calc Af Amer: >60 mL/min (ref >60)
Glucose, Bld: 97 mg/dL (ref 65–99)
POTASSIUM: 3.8 mmol/L (ref 3.5–5.1)
Sodium: 132 mmol/L — ABNORMAL LOW (ref 135–145)
Total Bilirubin: 0.7 mg/dL (ref 0.3–1.2)
Total Protein: 6.7 g/dL (ref 6.5–8.1)

## 2017-07-13 LAB — CBC
HEMATOCRIT: 40.8 % (ref 36.0–46.0)
Hemoglobin: 12.9 g/dL (ref 12.0–15.0)
MCH: 27 pg (ref 26.0–34.0)
MCHC: 31.6 g/dL (ref 30.0–36.0)
MCV: 85.4 fL (ref 78.0–100.0)
Platelets: 372 10*3/uL (ref 150–400)
RBC: 4.78 MIL/uL (ref 3.87–5.11)
RDW: 14.7 % (ref 11.5–15.5)
WBC: 9.4 10*3/uL (ref 4.0–10.5)

## 2017-07-13 LAB — TSH: TSH: 2.01 u[IU]/mL (ref 0.350–4.500)

## 2017-07-13 LAB — TROPONIN I

## 2017-07-13 MED ORDER — DIPHENHYDRAMINE HCL 25 MG PO CAPS: 25.0000 mg | ORAL_CAPSULE | Freq: Every evening | ORAL | Status: DC | PRN

## 2017-07-13 MED ORDER — ENOXAPARIN SODIUM 30 MG/0.3ML ~~LOC~~ SOLN
30.0000 mg | Freq: Every day | SUBCUTANEOUS | Status: DC
  Administered 2017-07-13: 30 mg via SUBCUTANEOUS
  Filled 2017-07-13: qty 0.3

## 2017-07-13 MED ORDER — OXYCODONE-ACETAMINOPHEN 5-325 MG PO TABS
1.0000 | ORAL_TABLET | Freq: Four times a day (QID) | ORAL | Status: DC | PRN
  Administered 2017-07-14 – 2017-07-16 (×5): 1 via ORAL
  Filled 2017-07-13 (×6): qty 1

## 2017-07-13 MED ORDER — ACETAMINOPHEN 325 MG PO TABS
650.0000 mg | ORAL_TABLET | Freq: Four times a day (QID) | ORAL | Status: DC | PRN
  Administered 2017-07-14 – 2017-07-18 (×3): 650 mg via ORAL
  Filled 2017-07-13 (×3): qty 2

## 2017-07-13 MED ORDER — METOPROLOL TARTRATE 50 MG PO TABS
50.0000 mg | ORAL_TABLET | Freq: Two times a day (BID) | ORAL | Status: DC
  Administered 2017-07-13 – 2017-07-18 (×10): 50 mg via ORAL
  Filled 2017-07-13 (×10): qty 1

## 2017-07-13 MED ORDER — OXYCODONE-ACETAMINOPHEN 5-325 MG PO TABS: 1.0000 | ORAL_TABLET | ORAL | Status: DC | PRN

## 2017-07-13 MED ORDER — ORAL CARE MOUTH RINSE
15.0000 mL | Freq: Two times a day (BID) | OROMUCOSAL | Status: DC
  Administered 2017-07-13 – 2017-07-18 (×9): 15 mL via OROMUCOSAL

## 2017-07-13 MED ORDER — METOPROLOL TARTRATE 25 MG PO TABS
25.0000 mg | ORAL_TABLET | Freq: Two times a day (BID) | ORAL | Status: DC
  Administered 2017-07-13 (×2): 25 mg via ORAL
  Filled 2017-07-13 (×2): qty 1

## 2017-07-13 MED ORDER — ACETAMINOPHEN 650 MG RE SUPP: 650.0000 mg | Freq: Four times a day (QID) | RECTAL | Status: DC | PRN

## 2017-07-13 MED ORDER — SODIUM CHLORIDE 0.9 % IV SOLN
INTRAVENOUS | Status: AC
  Administered 2017-07-13: 01:00:00 via INTRAVENOUS

## 2017-07-13 MED ORDER — IOPAMIDOL (ISOVUE-370) INJECTION 76%
INTRAVENOUS | Status: AC
  Administered 2017-07-13: 80 mL
  Filled 2017-07-13: qty 100

## 2017-07-13 MED ORDER — ENSURE ENLIVE PO LIQD
237.0000 mL | Freq: Three times a day (TID) | ORAL | Status: DC
  Administered 2017-07-13 – 2017-07-17 (×4): 237 mL via ORAL

## 2017-07-13 MED ORDER — SIMVASTATIN 40 MG PO TABS
40.0000 mg | ORAL_TABLET | Freq: Every day | ORAL | Status: DC
  Administered 2017-07-13 – 2017-07-17 (×6): 40 mg via ORAL
  Filled 2017-07-13 (×6): qty 1

## 2017-07-13 NOTE — Progress Notes (Signed)
Initial Nutrition Assessment  DOCUMENTATION CODES:   Not applicable  INTERVENTION:   Ensure Enlive po TID, each supplement provides 350 kcal and 20 grams of protein  NUTRITION DIAGNOSIS:   Inadequate oral intake related to poor appetite as evidenced by per patient/family report.  GOAL:   Patient will meet greater than or equal to 90% of their needs  MONITOR:   PO intake, Supplement acceptance, Labs, Weight trends  REASON FOR ASSESSMENT:   Malnutrition Screening Tool    ASSESSMENT:   Pt with PMH significant HTN, AAA, and nicotine dependence. Presents this admission with complaints of dyspnea over the past several days. CT scan reveals  3.5 cm cavitary mass involving the right upper lobe, most likely indicating a primary bronchogenic carcinoma.    Spoke with pt at bedside. Reports having poor appetite for 6 months. States she typically eats 2 meals per day. Would not elaborate. This morning she ate 100% of her  toast and eggs for breakfast. Denies issues swallowing or taste changes. Does not consume supplementation at home but would like to try it here.   Pt reports a 20 lb weight loss within one year. States she began losing weight when she started her job six months ago that required her to walk 6 miles. Records are limited in weight history. Suspect wt loss but hard to quantify. Nutrition-Focused physical exam completed. Suspect some form of malnutrition but unable to diagnosis at this time.   Medications reviewed.  Labs reviewed: Na 132 (L) ALT 12 (H)  NUTRITION - FOCUSED PHYSICAL EXAM:    Most Recent Value  Orbital Region  No depletion  Upper Arm Region  No depletion  Thoracic and Lumbar Region  No depletion  Buccal Region  Mild depletion  Temple Region  Moderate depletion  Clavicle Bone Region  Moderate depletion  Clavicle and Acromion Bone Region  Moderate depletion  Scapular Bone Region  No depletion  Dorsal Hand  Moderate depletion  Patellar Region  Moderate  depletion  Anterior Thigh Region  Moderate depletion  Posterior Calf Region  Moderate depletion  Edema (RD Assessment)  None  Hair  Reviewed  Eyes  Reviewed  Mouth  Reviewed  Skin  Reviewed  Nails  Reviewed      Diet Order:  Diet regular Room service appropriate? Yes; Fluid consistency: Thin  EDUCATION NEEDS:   Not appropriate for education at this time  Skin:  Skin Assessment: Reviewed RN Assessment  Last BM:  07/12/17  Height:   Ht Readings from Last 1 Encounters:  07/12/17 5\' 1"  (1.549 m)    Weight:   Wt Readings from Last 1 Encounters:  07/13/17 118 lb 12.8 oz (53.9 kg)    Ideal Body Weight:  47.7 kg  BMI:  Body mass index is 22.45 kg/m.  Estimated Nutritional Needs:   Kcal:  1600-1800 kcal/day  Protein:  80-90 g/day  Fluid:  >1.6 L/day    Mariana Single RD, LDN Clinical Nutrition Pager # - [phone]

## 2017-07-13 NOTE — Procedures (Deleted)
LSCV PAC removal RUE PICC SVC RA EBL 0 Comp 0

## 2017-07-13 NOTE — Consult Note (Signed)
Name: Chelsea Baker MRN: [id number] DOB: 10-19-1945    ADMISSION DATE:  07/12/2017 CONSULTATION DATE:  07/13/16  REFERRING MD :  Dr. Sheran Fava   CHIEF COMPLAINT:  Shortness of Breath   BRIEF PATIENT DESCRIPTION: 72 y/o F, smoker (40-50 years, 1ppd), who presented to [facility] on 1/14 with complaints of dyspnea.     HISTORY OF PRESENT ILLNESS:  72 y/o F, smoker (40-50 years, 1ppd), who presented to [facility] on 1/14 with complaints of dyspnea.    The patient reports her son was recently ill with a cold but recovered.  She initially thought she had the same cold / flu but it did not resolve.  She reported increased SOB, decreased appetite with weight loss (15lbs over 3-6 months / also started a new job at [employer] / more active), night sweats, cough with increased sputum production (thicker / darker than usual) and fatigue.  She has not been able to work in the recent months. She developed chest pressure which prompted her visit to the ER.    Initial work up included a CXR which was concerning for a 3.4 cm RUL mass with possible cavitation.  Labs - D-dimer 2.05, Na 132, K 3.8, Cl 94, CO2 29, BUN 12/ Sr Cr 0.81, troponin negative, WBC 9.4, Hgb 12.9 and platelets 372.  CXR was followed up with a CTA of the chest which was negative for PE but showed a 3.4 cm partially cavitary mass in the posterior RUL.    PCCM consulted for evaluation of lung mass.   Denies hemoptysis, swollen lymph nodes, n/v/d.  She is not up to date on malignancy screening. She has a hiatal hernia but denies reflux / choking events.      PAST MEDICAL HISTORY :   has a past medical history of AAA (abdominal aortic aneurysm) ([place]), Diverticulosis, GERD (gastroesophageal reflux disease), Hiatal hernia, Hypertension, Hypokalemia, and Lumbar spondylolysis.  has a past surgical history that includes Abdominal aortic aneurysm repair (10/01/10).   Prior to Admission medications   Medication Sig Start Date End Date Taking? Authorizing  Provider  acetaminophen (TYLENOL) 500 MG tablet Take 500-1,000 mg by mouth every 6 (six) hours as needed for mild pain, moderate pain or headache.   Yes [provider]  aspirin 325 MG EC tablet Take 325 mg by mouth as needed for pain.    Yes [provider]  diphenhydrAMINE (BENADRYL) 25 MG tablet Take 25 mg by mouth as needed for allergies.    Yes [provider]  dextromethorphan-guaiFENesin (MUCINEX DM) 30-600 MG per 12 hr tablet Take 1 tablet by mouth every 12 (twelve) hours as needed.    [provider]  diphenhydrAMINE (SOMINEX) 25 MG tablet Take 25 mg by mouth as needed.    [provider]  metoprolol tartrate (LOPRESSOR) 25 MG tablet Take 25 mg by mouth 2 (two) times daily.      [provider]  oxyCODONE-acetaminophen (PERCOCET) 5-325 MG per tablet Take 1 tablet by mouth every 4 (four) hours as needed.      [provider]  simvastatin (ZOCOR) 40 MG tablet Take 40 mg by mouth at bedtime.      [provider]   No Known Allergies  FAMILY HISTORY:  family history includes Coronary artery disease in her father; Heart disease in her father and mother; Heart failure in her mother.   SOCIAL HISTORY:  reports that she has been smoking cigarettes.  She has a 50.00 pack-year smoking history.  she has never used smokeless tobacco. She reports that she drinks alcohol. She reports that she does not use drugs.  REVIEW OF SYSTEMS:  POSITIVES IN BOLD  Constitutional: Negative for fever, chills, weight loss, malaise/fatigue and diaphoresis.  HENT: Negative for hearing loss, ear pain, nosebleeds, congestion, sore throat, neck pain, tinnitus and ear discharge.   Eyes: Negative for blurred vision, double vision, photophobia, pain, discharge and redness.  Respiratory: Negative for cough, chest pressure, hemoptysis, sputum production, shortness of breath, wheezing and stridor.   Cardiovascular: Negative for chest pain, palpitations,  orthopnea, claudication, leg swelling and PND.  Gastrointestinal: Negative for heartburn, nausea, vomiting, abdominal pain, diarrhea, constipation, blood in stool and melena.  Genitourinary: Negative for dysuria, urgency, frequency, hematuria and flank pain.  Musculoskeletal: Negative for myalgias, back pain, joint pain and falls.  Skin: Negative for itching and rash.  Neurological: Negative for dizziness, tingling, tremors, sensory change, speech change, focal weakness, seizures, loss of consciousness, weakness and headaches.  Endo/Heme/Allergies: Negative for environmental allergies and polydipsia. Does not bruise/bleed easily.  SUBJECTIVE:   VITAL SIGNS: Temp:  [98.1 F (36.7 C)-99.9 F (37.7 C)] 98.2 F (36.8 C) (01/15 0342) Pulse Rate:  [74-108] 74 (01/15 0342) Resp:  [15-18] 18 (01/15 0342) BP: (136-195)/(86-151) 151/89 (01/15 0342) SpO2:  [90 %-97 %] 94 % (01/15 0342) Weight:  [115 lb 8 oz (52.4 kg)-118 lb 12.8 oz (53.9 kg)] 118 lb 12.8 oz (53.9 kg) (01/15 0500)  PHYSICAL EXAMINATION: General: thin elderly female in NAD, sitting up in bed.  Children at bedside.  HEENT: MM pink/moist, no LAN  PSY: calm Neuro: AAOx4, speech clear, MAE  CV: s1s2 rrr, no m/r/g PULM: even/non-labored, lungs bilaterally with bibasilar posterior crackles  HW:EXHB, non-tender, bsx4 active  Extremities: warm/dry, no edema  Skin: no rashes or lesions  Recent Labs  Lab 07/12/17 1434 07/13/17 0237  NA 132* 132*  K 4.0 3.8  CL 91* 94*  CO2 30 29  BUN 12 12  CREATININE 0.86 0.81  GLUCOSE 117* 97   Recent Labs  Lab 07/12/17 1434 07/13/17 0237  HGB 13.9 12.9  HCT 43.6 40.8  WBC 8.5 9.4  PLT 399 372   Dg Chest 2 View  Result Date: 07/12/2017 CLINICAL DATA:  Productive cough, fatigue and short of breath EXAM: CHEST  2 VIEW COMPARISON:  10/12/2010 radiograph FINDINGS: Approximate 3.4 cm right upper lobe lung mass, possibly with cavitation on the lateral view. No pleural effusion. Normal  heart size. Aortic atherosclerosis. No pneumothorax. Partially visualized distal aortic stent. IMPRESSION: 1. Approximate 3.4 cm right upper lobe lung mass, possibly cavitary. Recommend CT chest for further evaluation. Electronically Signed   By: Donavan Foil M.D.   On: 07/12/2017 14:06   Ct Chest W Contrast  Addendum Date: 07/13/2017   ADDENDUM REPORT: 07/13/2017 07:56 ADDENDUM: I just spoke with Dr. Tessa Lerner by telephone and was asked to review this exam for the presence of pulmonary embolus. I reviewed the images with focused attention on the pulmonary arteries. There is no filling defect in the main pulmonary outflow tract, either main pulmonary artery, or lobar pulmonary arteries to either lung. Pulmonary arterial opacification is insufficient to exclude distal segmental and subsegmental embolus. Electronically Signed   By: Misty Stanley M.D.   On: 07/13/2017 07:56   Result Date: 07/13/2017 CLINICAL DATA:  Right upper lobe lung mass on chest x-ray earlier today. Patient presented to the emergency department with productive cough, fatigue and shortness of breath. EXAM: CT CHEST WITH CONTRAST TECHNIQUE:  Multidetector CT imaging of the chest was performed during intravenous contrast administration. CONTRAST:  77mL ISOVUE-300 IOPAMIDOL INJECTION 61% IV. COMPARISON:  No prior CT.  Chest x-ray earlier today and 10/12/2010. FINDINGS: Cardiovascular: Normal heart size. Mild three-vessel coronary atherosclerosis. No pericardial effusion. Severe atherosclerosis involving the thoracic and upper abdominal aorta. The extreme proximal portion of the abdominal aortic stent graft is visible and is patent. Mediastinum/Nodes: Normal sized mediastinal and hilar lymph nodes, the largest a right paratracheal node (station 4R) measuring approximately 1.6 x 1.0 cm. Gas-filled esophagus. No evidence of hiatal hernia. Thyroid gland unremarkable. Lungs/Pleura: Spiculated mass involving the right upper lobe with maximum  measurements approximating 3.5 x 3.0 x 3.5 cm, cavitary with gas and fluid centrally. Approximate 0.4 x 0.3 cm nodule involving the superior segment right lower lobe (series 5, image 63). No nodules or masses elsewhere in either lung. Emphysematous changes throughout both lungs. Scar/bronchiectasis involving the lingula. No pleural effusions. Upper Abdomen: Diffuse hepatic steatosis without focal parenchymal abnormality involving the visualized liver. Visualized upper abdomen otherwise unremarkable. Musculoskeletal: Degenerative disc disease and spondylosis involving the thoracic spine in the visualized lower cervical spine. No acute osseous abnormality. No evidence of osseous metastatic disease. IMPRESSION: 1. Approximate 3.5 cm cavitary mass involving the right upper lobe, most likely indicating a primary bronchogenic carcinoma. 2. Indeterminate 4 mm nodule involving the superior segment right lower lobe. Attention to this nodule on subsequent imaging is recommended. 3. No evidence of metastatic disease otherwise involving the chest or upper abdomen. 4. Diffuse hepatic steatosis without focal parenchymal abnormality involving the visualized liver. Aortic Atherosclerosis (ICD10-I70.0) and Emphysema (ICD10-J43.9). Electronically Signed: By: Evangeline Dakin M.D. On: 07/12/2017 16:31   Ct Angio Chest Pe W Or Wo Contrast  Result Date: 07/13/2017 CLINICAL DATA:  Shortness of breath, evaluate for PE. Cavitary right upper lobe mass. EXAM: CT ANGIOGRAPHY CHEST WITH CONTRAST TECHNIQUE: Multidetector CT imaging of the chest was performed using the standard protocol during bolus administration of intravenous contrast. Multiplanar CT image reconstructions and MIPs were obtained to evaluate the vascular anatomy. CONTRAST:  23mL ISOVUE-370 IOPAMIDOL (ISOVUE-370) INJECTION 76% COMPARISON:  CT chest dated 07/12/2017. FINDINGS: Cardiovascular: Satisfactory opacification of the bilateral pulmonary artery to the segmental level.  No evidence of pulmonary embolism. No evidence of thoracic aortic aneurysm. Atherosclerotic calcifications of the aortic arch. The heart is normal in size.  No pericardial effusion. Otherwise, unchanged from recent CT yesterday. Mediastinum/Nodes: Small mediastinal lymph nodes measuring up to 7 mm short axis. No suspicious hilar or axillary lymphadenopathy. Visualized thyroid is unremarkable. Lungs/Pleura: 3.4 x 3.3 cm partially cavitary mass in the posterior right upper lobe (series 10/image 36), suspicious for primary bronchogenic neoplasm. Underlying mild centrilobular emphysematous changes. 4 mm satellite nodule in the superior segment right lower lobe (series 10/image 61). Additional mild nodularity anteriorly in the left upper lobe (series 10/image 69), possibly infectious. No pleural effusion or pneumothorax. Upper Abdomen: Visualized upper abdomen is notable for an incompletely visualized abdominal aortic stent. Musculoskeletal: Mild degenerative changes of the visualized thoracolumbar spine. Review of the MIP images confirms the above findings. IMPRESSION: No evidence of pulmonary embolism. Otherwise unchanged from recent CT chest. 3.4 cm partially cavitary mass in the posterior right upper lobe, suspicious for primary bronchogenic neoplasm. Aortic Atherosclerosis (ICD10-I70.0) and Emphysema (ICD10-J43.9). Electronically Signed   By: Julian Hy M.D.   On: 07/13/2017 10:53    SIGNIFICANT EVENTS  1/14  Admit with SOB  STUDIES:  CT Chest 1/15 >> approximate 3.5 cm cavitary mass involving  the RUL, most likely indicating a primary bronchogenic carcinoma.  Indeterminate 30mm nodule involving the superior segment RLL, no evidence of metastatic disease, diffuse hepatic steatosis CTA Chest 1/15 >> negative for PE but showed a 3.4 cm partially cavitary mass in the posterior RUL.  CULTURES Quantiferon Gold 1/14 >>   ANTIBIOTICS   ASSESSMENT / PLAN:  DISCUSSION: 72 y/o F, current smoker,  admitted with SOB.  Work up revealed a RUL mass with partial cavitation concerning for malignancy.  DDx also includes TB / infectious etiology.  Mass is peripheral in RUL, likely could be amenable to transthoracic needle biospy.   RUL Cavitary Mass - 3.4 cm RUL with cavitation, concern for malignancy  Tobacco Abuse  Elevated D-Dimer  20lb Weight Loss   P: Will ask IR to assess for transthoracic needle biopsy of RUL mass lesion Await quantiferon gold  Airborne precautions  Await AFB  Follow intermittent CXR  Nicotine patch  Diet as tolerated   PCCM will follow along with you.   Noe Gens, NP-C [facility] Pgr: [phone] or if no answer [phone] 07/13/2017, 1:00 PM

## 2017-07-13 NOTE — Plan of Care (Signed)
  Progressing Education: Knowledge of General Education information will improve 07/13/2017 2253 - Progressing by Talbert Forest, RN Health Behavior/Discharge Planning: Ability to manage health-related needs will improve 07/13/2017 2253 - Progressing by Talbert Forest, RN Clinical Measurements: Ability to maintain clinical measurements within normal limits will improve 07/13/2017 2253 - Progressing by Talbert Forest, RN Will remain free from infection 07/13/2017 2253 - Progressing by Talbert Forest, RN Diagnostic test results will improve 07/13/2017 2253 - Progressing by Talbert Forest, RN Respiratory complications will improve 07/13/2017 2253 - Progressing by Talbert Forest, RN Cardiovascular complication will be avoided 07/13/2017 2253 - Progressing by Talbert Forest, RN Activity: Risk for activity intolerance will decrease 07/13/2017 2253 - Progressing by Talbert Forest, RN Nutrition: Adequate nutrition will be maintained 07/13/2017 2253 - Progressing by Talbert Forest, RN Coping: Level of anxiety will decrease 07/13/2017 2253 - Progressing by Talbert Forest, RN Elimination: Will not experience complications related to bowel motility 07/13/2017 2253 - Progressing by Talbert Forest, RN Will not experience complications related to urinary retention 07/13/2017 2253 - Progressing by Talbert Forest, RN Pain Managment: General experience of comfort will improve 07/13/2017 2253 - Progressing by Talbert Forest, RN Safety: Ability to remain free from injury will improve 07/13/2017 2253 - Progressing by Talbert Forest, RN Skin Integrity: Risk for impaired skin integrity will decrease 07/13/2017 2253 - Progressing by Talbert Forest, RN

## 2017-07-13 NOTE — Progress Notes (Signed)
ANTICOAGULATION CONSULT NOTE - Initial Consult  Pharmacy Consult for Lovenox Indication: VTE prophylaxis  No Known Allergies  Patient Measurements: Height: 5\' 1"  (154.9 cm) Weight: 118 lb 12.8 oz (53.9 kg) IBW/kg (Calculated) : 47.8   Vital Signs: Temp: 98.2 F (36.8 C) (01/15 0342) Temp Source: Oral (01/15 0342) BP: 151/89 (01/15 0342) Pulse Rate: 74 (01/15 0342)  Labs: Recent Labs    07/12/17 1434 07/12/17 2054 07/13/17 0237 07/13/17 0856  HGB 13.9  --  12.9  --   HCT 43.6  --  40.8  --   PLT 399  --  372  --   CREATININE 0.86  --  0.81  --   TROPONINI  --  <0.03 <0.03 <0.03    Estimated Creatinine Clearance: 48.1 mL/min (by C-G formula based on SCr of 0.81 mg/dL).   Medical History: Past Medical History:  Diagnosis Date  . AAA (abdominal aortic aneurysm) ([place])   . Diverticulosis   . GERD (gastroesophageal reflux disease)   . Hiatal hernia   . Hypertension   . Hypokalemia   . Lumbar spondylolysis    Assessment: 72 yo F sent from urgent care with SOB.   Pharmacy consulted to dose LMWH for VTE px. She was given LMWH 1 mg/kg = 55 mg 1/14 at 2339. CT neg for PE.  Wt 53.9 kg.  CBC WNL.   Plan:  LMWH 30 qhs for VTE px (low dose 2nd low weight) Pharmacy to sign off  Eudelia Bunch, Pharm.D. [phone] 07/13/2017 11:52 AM

## 2017-07-13 NOTE — Progress Notes (Signed)
PROGRESS NOTE  Chelsea Baker  ZHY:[id number] DOB: 03/04/1946 DOA: 07/12/2017 PCP: Chelsea Greaser, DO  Brief Narrative:   72 y/o F, smoker (40-50 years, 1ppd), who presented to [facility] on 1/14 with dyspnea.  The patient reports her son was recently ill with a cold but recovered.  She initially thought she had the same cold / flu but it did not resolve.  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  She has not been able to work in the recent months. CT of the chest which was negative for PE but showed a 3.4 cm partially cavitary mass in the posterior RUL.  PCCM is assisting and she will undergo CT guided biopsy by Radiology on Thursday. Quantiferon Gold is pending.     Assessment & Plan:  Cavitary lung lesion of the RUL, ddx includes malignancy, TB, Mac - [employer] x3 - Quantiferon gold - Respiratory isolation, negative pressure room -  Appreciate Pulmonary assistance -  Possibly biopsy on Thursday  Sinus tachycardia, resolved.  TSH 2.01 and no PE.  Okay to d/c telemetry  Essential hypertension, BP elevated -  Increase metoprolol  HLD, stable, continue statin  Hyponatremia, may have some mild SIADH from pulmonary lesion.  Not improved with IVF. -  D/c IVF  Nicotine dependent,  Nicotine patch   Although patient reported taking percocet at home and it was continued as an inpatient, there are no active oxycodone prescriptions in the [place] nor in the [place], [place], [place], [place], or [place] databases.  She had one hydrocodone Rx for 20 tabs given in Feb of 2018.    DVT prophylaxis:  lovenox Code Status:  full Family Communication:  Patient alone Disposition Plan:  Home in a few days post-biopsy.    Consultants:   Pulmonology  Procedures:     Antimicrobials:  Anti-infectives (From admission, onward)   None       Subjective:  Denies coughing up blood, but coughed up some "solid stuff" yesterday.  Now her phlegm is more clear.  Attributes her weight loss to her new job she started in the fall where she walks 6 miles a day.  Has night sweats, but states this is not abnormal for her.    Objective: Vitals:   07/12/17 2331 07/13/17 0342 07/13/17 0500 07/13/17 1500  BP: (!) 157/99 (!) 151/89  (!) 165/93  Pulse: (!) 102 74  91  Resp:  18  18  Temp: 99.9 F (37.7 C) 98.2 F (36.8 C)  98.8 F (37.1 C)  TempSrc: Oral Oral  Oral  SpO2: 97% 94%  98%  Weight: 52.4 kg (115 lb 8 oz)  53.9 kg (118 lb 12.8 oz)   Height: _0  (1.549 m)       Intake/Output Summary (Last 24 hours) at 07/13/2017 1552 Last data filed at 07/13/2017 1500 Gross per 24 hour  Intake 820 ml  Output -  Net 820 ml   Filed Weights   07/12/17 1306 07/12/17 2331 07/13/17 0500  Weight: 52.7 kg (116 lb 4 oz) 52.4 kg (115 lb 8 oz) 53.9 kg (118 lb 12.8 oz)    Examination:  General exam:  Thin adult female.  Mild temporal wasting.  No acute distress.  HEENT:  NCAT, MMM Respiratory system: Clear to auscultation bilaterally Cardiovascular system: Regular  rate and rhythm, normal S1/S2. No murmurs, rubs, gallops or clicks.  Warm extremities Gastrointestinal system: Normal active bowel sounds, soft, nondistended, nontender. MSK:  Normal tone and bulk, no lower extremity edema Neuro:  Grossly intact    Data Reviewed: I have personally reviewed following labs and imaging studies  CBC: Recent Labs  Lab 07/12/17 1434 07/13/17 0237  WBC 8.5 9.4  NEUTROABS 6.3  --   HGB 13.9 12.9  HCT 43.6 40.8  MCV 84.8 85.4  PLT 399 034   Basic Metabolic Panel: Recent Labs  Lab 07/12/17 1434 07/13/17 0237  NA 132* 132*  K 4.0 3.8  CL 91* 94*  CO2 30 29  GLUCOSE 117* 97  BUN 12 12  CREATININE 0.86 0.81  CALCIUM 9.9 9.2   GFR: Estimated Creatinine  Clearance: 48.1 mL/min (by C-G formula based on SCr of 0.81 mg/dL). Liver Function Tests: Recent Labs  Lab 07/13/17 0237  AST 20  ALT 12*  ALKPHOS 72  BILITOT 0.7  PROT 6.7  ALBUMIN 3.6   No results for input(s): LIPASE, AMYLASE in the last 168 hours. No results for input(s): AMMONIA in the last 168 hours. Coagulation Profile: No results for input(s): INR, PROTIME in the last 168 hours. Cardiac Enzymes: Recent Labs  Lab 07/12/17 2054 07/13/17 0237 07/13/17 0856  TROPONINI <0.03 <0.03 <0.03   BNP (last 3 results) No results for input(s): PROBNP in the last 8760 hours. HbA1C: No results for input(s): HGBA1C in the last 72 hours. CBG: No results for input(s): GLUCAP in the last 168 hours. Lipid Profile: No results for input(s): CHOL, HDL, LDLCALC, TRIG, CHOLHDL, LDLDIRECT in the last 72 hours. Thyroid Function Tests: Recent Labs    07/13/17 0237  TSH 2.010   Anemia Panel: No results for input(s): VITAMINB12, FOLATE, FERRITIN, TIBC, IRON, RETICCTPCT in the last 72 hours. Urine analysis:    Component Value Date/Time   COLORURINE YELLOW 07/12/2017 1304   APPEARANCEUR CLEAR 07/12/2017 1304   LABSPEC 1.013 07/12/2017 1304   PHURINE 5.0 07/12/2017 1304   GLUCOSEU NEGATIVE 07/12/2017 1304   HGBUR NEGATIVE 07/12/2017 1304   BILIRUBINUR NEGATIVE 07/12/2017 1304   KETONESUR 5 (A) 07/12/2017 1304   PROTEINUR 30 (A) 07/12/2017 1304   UROBILINOGEN 0.2 09/25/2010 0357   NITRITE NEGATIVE 07/12/2017 1304   LEUKOCYTESUR TRACE (A) 07/12/2017 1304   Sepsis Labs: _0 (procalcitonin:4,lacticidven:4)  )No results found for this or any previous visit (from the past 240 hour(s)).    Radiology Studies: Dg Chest 2 View  Result Date: 07/12/2017 CLINICAL DATA:  Productive cough, fatigue and [name] of breath EXAM: CHEST  2 VIEW COMPARISON:  10/12/2010 radiograph FINDINGS: Approximate 3.4 cm right upper lobe lung mass, possibly with cavitation on the lateral view. No pleural  effusion. Normal heart size. Aortic atherosclerosis. No pneumothorax. Partially visualized distal aortic stent. IMPRESSION: 1. Approximate 3.4 cm right upper lobe lung mass, possibly cavitary. Recommend CT chest for further evaluation. Electronically Signed   By: Donavan Foil M.D.   On: 07/12/2017 14:06   Ct Chest W Contrast  Addendum Date: 07/13/2017   ADDENDUM REPORT: 07/13/2017 07:56 ADDENDUM: I just spoke with Dr. Tessa Lerner by telephone and was asked to review this exam for the presence of pulmonary embolus. I reviewed the images with focused attention on the pulmonary arteries. There is no filling defect in the main pulmonary outflow tract, either main pulmonary artery, or lobar pulmonary arteries to either lung. Pulmonary arterial opacification is insufficient to exclude distal segmental and subsegmental embolus. Electronically  Signed   By: Misty Stanley M.D.   On: 07/13/2017 07:56   Result Date: 07/13/2017 CLINICAL DATA:  Right upper lobe lung mass on chest x-ray earlier today. Patient presented to the emergency department with productive cough, fatigue and shortness of breath. EXAM: CT CHEST WITH CONTRAST TECHNIQUE: Multidetector CT imaging of the chest was performed during intravenous contrast administration. CONTRAST:  41m ISOVUE-300 IOPAMIDOL INJECTION 61% IV. COMPARISON:  No prior CT.  Chest x-ray earlier today and 10/12/2010. FINDINGS: Cardiovascular: Normal heart size. Mild three-vessel coronary atherosclerosis. No pericardial effusion. Severe atherosclerosis involving the thoracic and upper abdominal aorta. The extreme proximal portion of the abdominal aortic stent graft is visible and is patent. Mediastinum/Nodes: Normal sized mediastinal and hilar lymph nodes, the largest a right paratracheal node (station 4R) measuring approximately 1.6 x 1.0 cm. Gas-filled esophagus. No evidence of hiatal hernia. Thyroid gland unremarkable. Lungs/Pleura: Spiculated mass involving the right upper lobe with  maximum measurements approximating 3.5 x 3.0 x 3.5 cm, cavitary with gas and fluid centrally. Approximate 0.4 x 0.3 cm nodule involving the superior segment right lower lobe (series 5, image 63). No nodules or masses elsewhere in either lung. Emphysematous changes throughout both lungs. Scar/bronchiectasis involving the lingula. No pleural effusions. Upper Abdomen: Diffuse hepatic steatosis without focal parenchymal abnormality involving the visualized liver. Visualized upper abdomen otherwise unremarkable. Musculoskeletal: Degenerative disc disease and spondylosis involving the thoracic spine in the visualized lower cervical spine. No acute osseous abnormality. No evidence of osseous metastatic disease. IMPRESSION: 1. Approximate 3.5 cm cavitary mass involving the right upper lobe, most likely indicating a primary bronchogenic carcinoma. 2. Indeterminate 4 mm nodule involving the superior segment right lower lobe. Attention to this nodule on subsequent imaging is recommended. 3. No evidence of metastatic disease otherwise involving the chest or upper abdomen. 4. Diffuse hepatic steatosis without focal parenchymal abnormality involving the visualized liver. Aortic Atherosclerosis (ICD10-I70.0) and Emphysema (ICD10-J43.9). Electronically Signed: By: TEvangeline DakinM.D. On: 07/12/2017 16:31   Ct Angio Chest Pe W Or Wo Contrast  Result Date: 07/13/2017 CLINICAL DATA:  Shortness of breath, evaluate for PE. Cavitary right upper lobe mass. EXAM: CT ANGIOGRAPHY CHEST WITH CONTRAST TECHNIQUE: Multidetector CT imaging of the chest was performed using the standard protocol during bolus administration of intravenous contrast. Multiplanar CT image reconstructions and MIPs were obtained to evaluate the vascular anatomy. CONTRAST:  881mISOVUE-370 IOPAMIDOL (ISOVUE-370) INJECTION 76% COMPARISON:  CT chest dated 07/12/2017. FINDINGS: Cardiovascular: Satisfactory opacification of the bilateral pulmonary artery to the  segmental level. No evidence of pulmonary embolism. No evidence of thoracic aortic aneurysm. Atherosclerotic calcifications of the aortic arch. The heart is normal in size.  No pericardial effusion. Otherwise, unchanged from recent CT yesterday. Mediastinum/Nodes: Small mediastinal lymph nodes measuring up to 7 mm Sina Sumpter axis. No suspicious hilar or axillary lymphadenopathy. Visualized thyroid is unremarkable. Lungs/Pleura: 3.4 x 3.3 cm partially cavitary mass in the posterior right upper lobe (series 10/image 36), suspicious for primary bronchogenic neoplasm. Underlying mild centrilobular emphysematous changes. 4 mm satellite nodule in the superior segment right lower lobe (series 10/image 61). Additional mild nodularity anteriorly in the left upper lobe (series 10/image 69), possibly infectious. No pleural effusion or pneumothorax. Upper Abdomen: Visualized upper abdomen is notable for an incompletely visualized abdominal aortic stent. Musculoskeletal: Mild degenerative changes of the visualized thoracolumbar spine. Review of the MIP images confirms the above findings. IMPRESSION: No evidence of pulmonary embolism. Otherwise unchanged from recent CT chest. 3.4 cm partially cavitary mass in the posterior right  upper lobe, suspicious for primary bronchogenic neoplasm. Aortic Atherosclerosis (ICD10-I70.0) and Emphysema (ICD10-J43.9). Electronically Signed   By: Julian Hy M.D.   On: 07/13/2017 10:53     Scheduled Meds: . enoxaparin (LOVENOX) injection  30 mg Subcutaneous QHS  . feeding supplement (ENSURE ENLIVE)  237 mL Oral TID BM  . mouth rinse  15 mL Mouth Rinse BID  . metoprolol tartrate  25 mg Oral BID  . simvastatin  40 mg Oral QHS   Continuous Infusions:   LOS: 1 day    Time spent: 30 min    Janece Canterbury, MD [facility] Pager [phone]  If 7PM-7AM, please contact night-coverage [email or web address] Password [email or web address] 07/13/2017, 3:52 PM

## 2017-07-13 NOTE — Progress Notes (Signed)
Resumed care for this pt at 0043. I agree with previous RN's assessment. Pt placed on airborne and contact precautions. Will continue to monitor.

## 2017-07-13 NOTE — Consult Note (Signed)
Chief Complaint: Patient was seen in consultation today for CT-guided right lung mass biopsy Chief Complaint  Patient presents with  . sent from Urgent care    Referring Physician(s): Ollis,B,NP/Byrum,R  Supervising Physician: Marybelle Killings  Patient Status: [facility] - In-pt  History of Present Illness: Chelsea Baker is a 72 y.o. female Baker admitted to [facility] yesterday with persistent dyspnea, diminished appetite, weight loss, night sweats, cough and fatigue.  Subsequent CT chest revealed 3.5 cm cavitary mass involving the right upper lobe, most likely primary bronchogenic carcinoma.  Also noted was an indeterminate 4 mm nodule involving the superior segment right lower lobe.  Request now received from pulmonology for CT-guided right lung mass biopsy for further evaluation.  Past Medical History:  Diagnosis Date  . AAA (abdominal aortic aneurysm) ([place])   . Diverticulosis   . GERD (gastroesophageal reflux disease)   . Hiatal hernia   . Hypertension   . Hypokalemia   . Lumbar spondylolysis     Past Surgical History:  Procedure Laterality Date  . ABDOMINAL AORTIC ANEURYSM REPAIR  10/01/10   endostent repair    Allergies: Patient has no known allergies.  Medications: Prior to Admission medications   Medication Sig Start Date End Date Taking? Authorizing Provider  acetaminophen (TYLENOL) 500 MG tablet Take 500-1,000 mg by mouth every 6 (six) hours as needed for mild pain, moderate pain or headache.   Yes [provider]  aspirin 325 MG EC tablet Take 325 mg by mouth as needed for pain.    Yes [provider]  diphenhydrAMINE (BENADRYL) 25 MG tablet Take 25 mg by mouth as needed for allergies.    Yes [provider]  dextromethorphan-guaiFENesin (MUCINEX DM) 30-600 MG per 12 hr tablet Take 1 tablet by mouth every 12 (twelve) hours as needed.    [provider]  diphenhydrAMINE (SOMINEX) 25 MG tablet Take 25 mg by mouth as  needed.    [provider]  metoprolol tartrate (LOPRESSOR) 25 MG tablet Take 25 mg by mouth 2 (two) times daily.      [provider]  oxyCODONE-acetaminophen (PERCOCET) 5-325 MG per tablet Take 1 tablet by mouth every 4 (four) hours as needed.      [provider]  simvastatin (ZOCOR) 40 MG tablet Take 40 mg by mouth at bedtime.      [provider]     Family History  Problem Relation Age of Onset  . Heart disease Mother   . Heart failure Mother   . Heart disease Father   . Coronary artery disease Father     Social History   Socioeconomic History  . Marital status: Single    Spouse name: None  . Number of children: None  . Years of education: None  . Highest education level: None  Social Needs  . Financial resource strain: None  . Food insecurity - worry: None  . Food insecurity - inability: None  . Transportation needs - medical: None  . Transportation needs - non-medical: None  Occupational History  . None  Tobacco Use  . Smoking status: Current Every Day Baker    Packs/day: 1.00    Years: 50.00    Pack years: 50.00    Types: Cigarettes  . Smokeless tobacco: Never Used  Substance and Sexual Activity  . Alcohol use: Yes    Comment: occasionally  . Drug use: No  . Sexual activity: None  Other Topics Concern  . None  Social  History Narrative  . None    Review of Systems see above; currently denies fever, chest pain, abdominal/back pain, nausea, vomiting or bleeding.  She does have occasional headaches.  Vital Signs: BP (!) 165/93 (BP Location: Left Arm)   Pulse 91   Temp 98.8 F (37.1 C) (Oral)   Resp 18   Ht 5\' 1"  (1.549 m)   Wt 118 lb 12.8 oz (53.9 kg)   SpO2 98%   BMI 22.45 kg/m   Physical Exam awake, alert.  Chest clear to auscultation bilaterally.  Heart with regular rate and rhythm; abd soft, positive bowel sounds, nontender.  No lower extremity edema.  Imaging: Dg Chest 2 View  Result Date:  07/12/2017 CLINICAL DATA:  Productive cough, fatigue and short of breath EXAM: CHEST  2 VIEW COMPARISON:  10/12/2010 radiograph FINDINGS: Approximate 3.4 cm right upper lobe lung mass, possibly with cavitation on the lateral view. No pleural effusion. Normal heart size. Aortic atherosclerosis. No pneumothorax. Partially visualized distal aortic stent. IMPRESSION: 1. Approximate 3.4 cm right upper lobe lung mass, possibly cavitary. Recommend CT chest for further evaluation. Electronically Signed   By: Donavan Foil M.D.   On: 07/12/2017 14:06   Ct Chest W Contrast  Addendum Date: 07/13/2017   ADDENDUM REPORT: 07/13/2017 07:56 ADDENDUM: I just spoke with Dr. Tessa Lerner by telephone and was asked to review this exam for the presence of pulmonary embolus. I reviewed the images with focused attention on the pulmonary arteries. There is no filling defect in the main pulmonary outflow tract, either main pulmonary artery, or lobar pulmonary arteries to either lung. Pulmonary arterial opacification is insufficient to exclude distal segmental and subsegmental embolus. Electronically Signed   By: Misty Stanley M.D.   On: 07/13/2017 07:56   Result Date: 07/13/2017 CLINICAL DATA:  Right upper lobe lung mass on chest x-ray earlier today. Patient presented to the emergency department with productive cough, fatigue and shortness of breath. EXAM: CT CHEST WITH CONTRAST TECHNIQUE: Multidetector CT imaging of the chest was performed during intravenous contrast administration. CONTRAST:  19mL ISOVUE-300 IOPAMIDOL INJECTION 61% IV. COMPARISON:  No prior CT.  Chest x-ray earlier today and 10/12/2010. FINDINGS: Cardiovascular: Normal heart size. Mild three-vessel coronary atherosclerosis. No pericardial effusion. Severe atherosclerosis involving the thoracic and upper abdominal aorta. The extreme proximal portion of the abdominal aortic stent graft is visible and is patent. Mediastinum/Nodes: Normal sized mediastinal and hilar lymph  nodes, the largest a right paratracheal node (station 4R) measuring approximately 1.6 x 1.0 cm. Gas-filled esophagus. No evidence of hiatal hernia. Thyroid gland unremarkable. Lungs/Pleura: Spiculated mass involving the right upper lobe with maximum measurements approximating 3.5 x 3.0 x 3.5 cm, cavitary with gas and fluid centrally. Approximate 0.4 x 0.3 cm nodule involving the superior segment right lower lobe (series 5, image 63). No nodules or masses elsewhere in either lung. Emphysematous changes throughout both lungs. Scar/bronchiectasis involving the lingula. No pleural effusions. Upper Abdomen: Diffuse hepatic steatosis without focal parenchymal abnormality involving the visualized liver. Visualized upper abdomen otherwise unremarkable. Musculoskeletal: Degenerative disc disease and spondylosis involving the thoracic spine in the visualized lower cervical spine. No acute osseous abnormality. No evidence of osseous metastatic disease. IMPRESSION: 1. Approximate 3.5 cm cavitary mass involving the right upper lobe, most likely indicating a primary bronchogenic carcinoma. 2. Indeterminate 4 mm nodule involving the superior segment right lower lobe. Attention to this nodule on subsequent imaging is recommended. 3. No evidence of metastatic disease otherwise involving the chest or upper abdomen. 4. Diffuse  hepatic steatosis without focal parenchymal abnormality involving the visualized liver. Aortic Atherosclerosis (ICD10-I70.0) and Emphysema (ICD10-J43.9). Electronically Signed: By: Evangeline Dakin M.D. On: 07/12/2017 16:31   Ct Angio Chest Pe W Or Wo Contrast  Result Date: 07/13/2017 CLINICAL DATA:  Shortness of breath, evaluate for PE. Cavitary right upper lobe mass. EXAM: CT ANGIOGRAPHY CHEST WITH CONTRAST TECHNIQUE: Multidetector CT imaging of the chest was performed using the standard protocol during bolus administration of intravenous contrast. Multiplanar CT image reconstructions and MIPs were  obtained to evaluate the vascular anatomy. CONTRAST:  57mL ISOVUE-370 IOPAMIDOL (ISOVUE-370) INJECTION 76% COMPARISON:  CT chest dated 07/12/2017. FINDINGS: Cardiovascular: Satisfactory opacification of the bilateral pulmonary artery to the segmental level. No evidence of pulmonary embolism. No evidence of thoracic aortic aneurysm. Atherosclerotic calcifications of the aortic arch. The heart is normal in size.  No pericardial effusion. Otherwise, unchanged from recent CT yesterday. Mediastinum/Nodes: Small mediastinal lymph nodes measuring up to 7 mm short axis. No suspicious hilar or axillary lymphadenopathy. Visualized thyroid is unremarkable. Lungs/Pleura: 3.4 x 3.3 cm partially cavitary mass in the posterior right upper lobe (series 10/image 36), suspicious for primary bronchogenic neoplasm. Underlying mild centrilobular emphysematous changes. 4 mm satellite nodule in the superior segment right lower lobe (series 10/image 61). Additional mild nodularity anteriorly in the left upper lobe (series 10/image 69), possibly infectious. No pleural effusion or pneumothorax. Upper Abdomen: Visualized upper abdomen is notable for an incompletely visualized abdominal aortic stent. Musculoskeletal: Mild degenerative changes of the visualized thoracolumbar spine. Review of the MIP images confirms the above findings. IMPRESSION: No evidence of pulmonary embolism. Otherwise unchanged from recent CT chest. 3.4 cm partially cavitary mass in the posterior right upper lobe, suspicious for primary bronchogenic neoplasm. Aortic Atherosclerosis (ICD10-I70.0) and Emphysema (ICD10-J43.9). Electronically Signed   By: Julian Hy M.D.   On: 07/13/2017 10:53    Labs:  CBC: Recent Labs    07/12/17 1434 07/13/17 0237  WBC 8.5 9.4  HGB 13.9 12.9  HCT 43.6 40.8  PLT 399 372    COAGS: No results for input(s): INR, APTT in the last 8760 hours.  BMP: Recent Labs    07/12/17 1434 07/13/17 0237  NA 132* 132*  K 4.0  3.8  CL 91* 94*  CO2 30 Chelsea  GLUCOSE 117* 97  BUN 12 12  CALCIUM 9.9 9.2  CREATININE 0.86 0.81  GFRNONAA >60 >60  GFRAA >60 >60    LIVER FUNCTION TESTS: Recent Labs    07/13/17 0237  BILITOT 0.7  AST 20  ALT 12*  ALKPHOS 72  PROT 6.7  ALBUMIN 3.6    TUMOR MARKERS: No results for input(s): AFPTM, CEA, CA199, CHROMGRNA in the last 8760 hours.  Assessment and Plan: 72 y.o. female Baker admitted to [facility] yesterday with persistent dyspnea, diminished appetite, weight loss, night sweats, cough and fatigue.  Subsequent CT chest revealed 3.5 cm cavitary mass involving the right upper lobe, most likely primary bronchogenic carcinoma.  Also noted was an indeterminate 4 mm nodule involving the superior segment right lower lobe.  Request now received from pulmonology for CT-guided right lung mass biopsy for further evaluation. Imaging studies were reviewed by Dr. Barbie Banner and right upper lung mass appears amenable to percutaneous biopsy.  Risks and benefits discussed with the patient including, but not limited to bleeding, infection, pneumothorax requiring chest tube placement, damage to adjacent structures or low yield requiring additional tests and death .All of the patient's questions were answered, patient is agreeable to proceed.Consent signed  and in chart. Procedure tent scheduled for 1/17.  QuantiFERON TB Gold pending.    Thank you for this interesting consult.  I greatly enjoyed [place] and look forward to participating in their care.  A copy of this report was sent to the requesting provider on this date.  Electronically Signed: D. Rowe Robert, PA-C 07/13/2017, 4:48 PM   I spent a total of 30 minutes  in face to face in clinical consultation, greater than 50% of which was counseling/coordinating care for CT-guided right lung mass biopsy

## 2017-07-14 DIAGNOSIS — I10 Essential (primary) hypertension: Secondary | ICD-10-CM

## 2017-07-14 DIAGNOSIS — E785 Hyperlipidemia, unspecified: Secondary | ICD-10-CM

## 2017-07-14 LAB — BASIC METABOLIC PANEL
ANION GAP: 7 (ref 5–15)
BUN: 9 mg/dL (ref 6–20)
CALCIUM: 9.6 mg/dL (ref 8.9–10.3)
CO2: 32 mmol/L (ref 22–32)
Chloride: 93 mmol/L — ABNORMAL LOW (ref 101–111)
Creatinine, Ser: 0.77 mg/dL (ref 0.44–1.00)
GLUCOSE: 108 mg/dL — AB (ref 65–99)
POTASSIUM: 4.7 mmol/L (ref 3.5–5.1)
SODIUM: 132 mmol/L — AB (ref 135–145)

## 2017-07-14 LAB — GLUCOSE, CAPILLARY
GLUCOSE-CAPILLARY: 113 mg/dL — AB (ref 65–99)
GLUCOSE-CAPILLARY: 126 mg/dL — AB (ref 65–99)
Glucose-Capillary: 90 mg/dL (ref 65–99)

## 2017-07-14 MED ORDER — ENOXAPARIN SODIUM 30 MG/0.3ML ~~LOC~~ SOLN: 30.0000 mg | Freq: Every day | SUBCUTANEOUS | Status: DC

## 2017-07-14 MED ORDER — ONDANSETRON HCL 4 MG/2ML IJ SOLN
4.0000 mg | Freq: Four times a day (QID) | INTRAMUSCULAR | Status: DC | PRN
  Administered 2017-07-14 – 2017-07-17 (×7): 4 mg via INTRAVENOUS
  Filled 2017-07-14 (×9): qty 2

## 2017-07-14 NOTE — Progress Notes (Signed)
PROGRESS NOTE        PATIENT DETAILS Name: Chelsea Baker Age: 72 y.o. Sex: female Date of Birth: 10-13-1945 Admit Date: 07/12/2017 Admitting Physician Jani Gravel, MD HFW:YOVZCHYI, Sunny Schlein, DO  Brief Narrative: Patient is a 72 y.o. female history of hypertension, nicotine dependency-presented with a 2-week history of cough, weight loss and was found to have a 3.4 cm cavitary mass in the posterior right upper lobe-highly suspicious for a primary bronchogenic neoplasm.  Seen by [facility] and and interventional radiology in consult, with plans for CT-guided biopsy on 1/17.  See below for further details  Subjective: Sitting up in bed-no major complaints.  Assessment/Plan: Right upper lobe cavitary lesion: Highly suspicious for primary lung malignancy-for CT-guided biopsy on 1/17.  She is currently on airborne isolation-awaiting uterine AFB smears-although suspicion for TB is quite low.  Spoke with pulmonology, if Gold QuantiFERON is negative-probably could discontinue airborne isolation.    Hyponatremia: Appears to be mild-agree with fluid restriction-as she could have SIADH.  Hypertension: Fairly well controlled-continue with metoprolol.  Dyslipidemia: Continue statin  DVT Prophylaxis: Prophylactic Lovenox  Code Status: Full code   Family Communication: None at bedside  Disposition Plan: Remain inpatien  Antimicrobial agents: Anti-infectives (From admission, onward)   None      Procedures: None  CONSULTS:  pulmonary/intensive care  Time spent: 25- minutes-Greater than 50% of this time was spent in counseling, explanation of diagnosis, planning of further management, and coordination of care.  MEDICATIONS: Scheduled Meds: . [START ON 07/15/2017] enoxaparin (LOVENOX) injection  30 mg Subcutaneous QHS  . feeding supplement (ENSURE ENLIVE)  237 mL Oral TID BM  . mouth rinse  15 mL Mouth Rinse BID  . metoprolol tartrate  50 mg Oral BID  .  simvastatin  40 mg Oral QHS   Continuous Infusions: PRN Meds:.acetaminophen **OR** acetaminophen, diphenhydrAMINE, ondansetron (ZOFRAN) IV, oxyCODONE-acetaminophen   PHYSICAL EXAM: Vital signs: Vitals:   07/13/17 0500 07/13/17 1500 07/13/17 2116 07/14/17 0449  BP:  (!) 165/93 (!) 141/80 (!) 149/94  Pulse:  91 77 80  Resp:  18 18 18   Temp:  98.8 F (37.1 C) 98.1 F (36.7 C) 98.7 F (37.1 C)  TempSrc:  Oral Oral Oral  SpO2:  98% 99% 95%  Weight: 53.9 kg (118 lb 12.8 oz)   53.6 kg (118 lb 2.7 oz)  Height:       Filed Weights   07/12/17 2331 07/13/17 0500 07/14/17 0449  Weight: 52.4 kg (115 lb 8 oz) 53.9 kg (118 lb 12.8 oz) 53.6 kg (118 lb 2.7 oz)   Body mass index is 22.33 kg/m.   General appearance :Awake, alert, not in any distress.  Eyes:, pupils equally reactive to light and accomodation,no scleral icterus. HEENT: Atraumatic and Normocephalic Neck: supple, no JVD. No cervical lymphadenopathy.  Resp:Good air entry bilaterally, no added sounds  CVS: S1 S2 regular, no murmurs.  GI: Bowel sounds present, Non tender and not distended with no gaurding, rigidity or rebound.No organomegaly Extremities: B/L Lower Ext shows no edema, both legs are warm to touch Neurology:Non focal Musculoskeletal:No digital cyanosis Skin:No Rash, warm and dry Wounds:N/A  I have personally reviewed following labs and imaging studies  LABORATORY DATA: CBC: Recent Labs  Lab 07/12/17 1434 07/13/17 0237  WBC 8.5 9.4  NEUTROABS 6.3  --   HGB 13.9 12.9  HCT 43.6 40.8  MCV  84.8 85.4  PLT 399 924    Basic Metabolic Panel: Recent Labs  Lab 07/12/17 1434 07/13/17 0237 07/14/17 0429  NA 132* 132* 132*  K 4.0 3.8 4.7  CL 91* 94* 93*  CO2 30 29 32  GLUCOSE 117* 97 108*  BUN 12 12 9   CREATININE 0.86 0.81 0.77  CALCIUM 9.9 9.2 9.6    GFR: Estimated Creatinine Clearance: 48.7 mL/min (by C-G formula based on SCr of 0.77 mg/dL).  Liver Function Tests: Recent Labs  Lab 07/13/17 0237   AST 20  ALT 12*  ALKPHOS 72  BILITOT 0.7  PROT 6.7  ALBUMIN 3.6   No results for input(s): LIPASE, AMYLASE in the last 168 hours. No results for input(s): AMMONIA in the last 168 hours.  Coagulation Profile: No results for input(s): INR, PROTIME in the last 168 hours.  Cardiac Enzymes: Recent Labs  Lab 07/12/17 2054 07/13/17 0237 07/13/17 0856  TROPONINI <0.03 <0.03 <0.03    BNP (last 3 results) No results for input(s): PROBNP in the last 8760 hours.  HbA1C: No results for input(s): HGBA1C in the last 72 hours.  CBG: Recent Labs  Lab 07/14/17 0846  GLUCAP 90    Lipid Profile: No results for input(s): CHOL, HDL, LDLCALC, TRIG, CHOLHDL, LDLDIRECT in the last 72 hours.  Thyroid Function Tests: Recent Labs    07/13/17 0237  TSH 2.010    Anemia Panel: No results for input(s): VITAMINB12, FOLATE, FERRITIN, TIBC, IRON, RETICCTPCT in the last 72 hours.  Urine analysis:    Component Value Date/Time   COLORURINE YELLOW 07/12/2017 [place] 07/12/2017 1304   LABSPEC 1.013 07/12/2017 1304   PHURINE 5.0 07/12/2017 1304   GLUCOSEU NEGATIVE 07/12/2017 1304   HGBUR NEGATIVE 07/12/2017 1304   BILIRUBINUR NEGATIVE 07/12/2017 1304   KETONESUR 5 (A) 07/12/2017 1304   PROTEINUR 30 (A) 07/12/2017 1304   UROBILINOGEN 0.2 09/25/2010 0357   NITRITE NEGATIVE 07/12/2017 1304   LEUKOCYTESUR TRACE (A) 07/12/2017 1304    Sepsis Labs: Lactic Acid, Venous    Component Value Date/Time   LATICACIDVEN 1.1 09/27/2010 2001    MICROBIOLOGY: No results found for this or any previous visit (from the past 240 hour(s)).  RADIOLOGY STUDIES/RESULTS: Dg Chest 2 View  Result Date: 07/12/2017 CLINICAL DATA:  Productive cough, fatigue and short of breath EXAM: CHEST  2 VIEW COMPARISON:  10/12/2010 radiograph FINDINGS: Approximate 3.4 cm right upper lobe lung mass, possibly with cavitation on the lateral view. No pleural effusion. Normal heart size. Aortic  atherosclerosis. No pneumothorax. Partially visualized distal aortic stent. IMPRESSION: 1. Approximate 3.4 cm right upper lobe lung mass, possibly cavitary. Recommend CT chest for further evaluation. Electronically Signed   By: Donavan Foil M.D.   On: 07/12/2017 14:06   Ct Chest W Contrast  Addendum Date: 07/13/2017   ADDENDUM REPORT: 07/13/2017 07:56 ADDENDUM: I just spoke with Dr. Tessa Lerner by telephone and was asked to review this exam for the presence of pulmonary embolus. I reviewed the images with focused attention on the pulmonary arteries. There is no filling defect in the main pulmonary outflow tract, either main pulmonary artery, or lobar pulmonary arteries to either lung. Pulmonary arterial opacification is insufficient to exclude distal segmental and subsegmental embolus. Electronically Signed   By: Misty Stanley M.D.   On: 07/13/2017 07:56   Result Date: 07/13/2017 CLINICAL DATA:  Right upper lobe lung mass on chest x-ray earlier today. Patient presented to the emergency department with productive cough, fatigue and  shortness of breath. EXAM: CT CHEST WITH CONTRAST TECHNIQUE: Multidetector CT imaging of the chest was performed during intravenous contrast administration. CONTRAST:  79mL ISOVUE-300 IOPAMIDOL INJECTION 61% IV. COMPARISON:  No prior CT.  Chest x-ray earlier today and 10/12/2010. FINDINGS: Cardiovascular: Normal heart size. Mild three-vessel coronary atherosclerosis. No pericardial effusion. Severe atherosclerosis involving the thoracic and upper abdominal aorta. The extreme proximal portion of the abdominal aortic stent graft is visible and is patent. Mediastinum/Nodes: Normal sized mediastinal and hilar lymph nodes, the largest a right paratracheal node (station 4R) measuring approximately 1.6 x 1.0 cm. Gas-filled esophagus. No evidence of hiatal hernia. Thyroid gland unremarkable. Lungs/Pleura: Spiculated mass involving the right upper lobe with maximum measurements approximating  3.5 x 3.0 x 3.5 cm, cavitary with gas and fluid centrally. Approximate 0.4 x 0.3 cm nodule involving the superior segment right lower lobe (series 5, image 63). No nodules or masses elsewhere in either lung. Emphysematous changes throughout both lungs. Scar/bronchiectasis involving the lingula. No pleural effusions. Upper Abdomen: Diffuse hepatic steatosis without focal parenchymal abnormality involving the visualized liver. Visualized upper abdomen otherwise unremarkable. Musculoskeletal: Degenerative disc disease and spondylosis involving the thoracic spine in the visualized lower cervical spine. No acute osseous abnormality. No evidence of osseous metastatic disease. IMPRESSION: 1. Approximate 3.5 cm cavitary mass involving the right upper lobe, most likely indicating a primary bronchogenic carcinoma. 2. Indeterminate 4 mm nodule involving the superior segment right lower lobe. Attention to this nodule on subsequent imaging is recommended. 3. No evidence of metastatic disease otherwise involving the chest or upper abdomen. 4. Diffuse hepatic steatosis without focal parenchymal abnormality involving the visualized liver. Aortic Atherosclerosis (ICD10-I70.0) and Emphysema (ICD10-J43.9). Electronically Signed: By: Evangeline Dakin M.D. On: 07/12/2017 16:31   Ct Angio Chest Pe W Or Wo Contrast  Result Date: 07/13/2017 CLINICAL DATA:  Shortness of breath, evaluate for PE. Cavitary right upper lobe mass. EXAM: CT ANGIOGRAPHY CHEST WITH CONTRAST TECHNIQUE: Multidetector CT imaging of the chest was performed using the standard protocol during bolus administration of intravenous contrast. Multiplanar CT image reconstructions and MIPs were obtained to evaluate the vascular anatomy. CONTRAST:  53mL ISOVUE-370 IOPAMIDOL (ISOVUE-370) INJECTION 76% COMPARISON:  CT chest dated 07/12/2017. FINDINGS: Cardiovascular: Satisfactory opacification of the bilateral pulmonary artery to the segmental level. No evidence of pulmonary  embolism. No evidence of thoracic aortic aneurysm. Atherosclerotic calcifications of the aortic arch. The heart is normal in size.  No pericardial effusion. Otherwise, unchanged from recent CT yesterday. Mediastinum/Nodes: Small mediastinal lymph nodes measuring up to 7 mm short axis. No suspicious hilar or axillary lymphadenopathy. Visualized thyroid is unremarkable. Lungs/Pleura: 3.4 x 3.3 cm partially cavitary mass in the posterior right upper lobe (series 10/image 36), suspicious for primary bronchogenic neoplasm. Underlying mild centrilobular emphysematous changes. 4 mm satellite nodule in the superior segment right lower lobe (series 10/image 61). Additional mild nodularity anteriorly in the left upper lobe (series 10/image 69), possibly infectious. No pleural effusion or pneumothorax. Upper Abdomen: Visualized upper abdomen is notable for an incompletely visualized abdominal aortic stent. Musculoskeletal: Mild degenerative changes of the visualized thoracolumbar spine. Review of the MIP images confirms the above findings. IMPRESSION: No evidence of pulmonary embolism. Otherwise unchanged from recent CT chest. 3.4 cm partially cavitary mass in the posterior right upper lobe, suspicious for primary bronchogenic neoplasm. Aortic Atherosclerosis (ICD10-I70.0) and Emphysema (ICD10-J43.9). Electronically Signed   By: Julian Hy M.D.   On: 07/13/2017 10:53     LOS: 2 days   Oren Binet, MD  [facility]  Pager:[phone]  If 7PM-7AM, please contact night-coverage [email or web address] Password [email or web address] 07/14/2017, 1:10 PM

## 2017-07-14 NOTE — Progress Notes (Signed)
Clamped CBI at 0932 per MD. Urine slightly pink. Will monitor urine output closely

## 2017-07-14 NOTE — Plan of Care (Signed)
Con to mon

## 2017-07-15 ENCOUNTER — Inpatient Hospital Stay (HOSPITAL_COMMUNITY): Payer: PPO

## 2017-07-15 ENCOUNTER — Encounter (HOSPITAL_COMMUNITY): Payer: Self-pay | Admitting: Radiology

## 2017-07-15 LAB — BASIC METABOLIC PANEL
ANION GAP: 7 (ref 5–15)
BUN: 15 mg/dL (ref 6–20)
CHLORIDE: 91 mmol/L — AB (ref 101–111)
CO2: 32 mmol/L (ref 22–32)
Calcium: 9.3 mg/dL (ref 8.9–10.3)
Creatinine, Ser: 1.06 mg/dL — ABNORMAL HIGH (ref 0.44–1.00)
GFR calc non Af Amer: 52 mL/min — ABNORMAL LOW (ref >60)
GFR, EST AFRICAN AMERICAN: 60 mL/min — AB (ref >60)
Glucose, Bld: 130 mg/dL — ABNORMAL HIGH (ref 65–99)
Potassium: 4.3 mmol/L (ref 3.5–5.1)
SODIUM: 130 mmol/L — AB (ref 135–145)

## 2017-07-15 LAB — CBC WITH DIFFERENTIAL/PLATELET
Basophils Absolute: 0 10*3/uL (ref 0.0–0.1)
Basophils Relative: 0 %
Eosinophils Absolute: 0.1 10*3/uL (ref 0.0–0.7)
Eosinophils Relative: 1 %
HEMATOCRIT: 38.1 % (ref 36.0–46.0)
HEMOGLOBIN: 11.6 g/dL — AB (ref 12.0–15.0)
LYMPHS ABS: 1.3 10*3/uL (ref 0.7–4.0)
Lymphocytes Relative: 16 %
MCH: 26.4 pg (ref 26.0–34.0)
MCHC: 30.4 g/dL (ref 30.0–36.0)
MCV: 86.8 fL (ref 78.0–100.0)
MONOS PCT: 7 %
Monocytes Absolute: 0.6 10*3/uL (ref 0.1–1.0)
NEUTROS ABS: 6.3 10*3/uL (ref 1.7–7.7)
NEUTROS PCT: 76 %
Platelets: 339 10*3/uL (ref 150–400)
RBC: 4.39 MIL/uL (ref 3.87–5.11)
RDW: 14.8 % (ref 11.5–15.5)
WBC: 8.2 10*3/uL (ref 4.0–10.5)

## 2017-07-15 LAB — ACID FAST SMEAR (AFB)

## 2017-07-15 LAB — ACID FAST SMEAR (AFB, MYCOBACTERIA): Acid Fast Smear: NEGATIVE

## 2017-07-15 LAB — PROTIME-INR
INR: 0.9
Prothrombin Time: 12 seconds (ref 11.4–15.2)

## 2017-07-15 MED ORDER — ENOXAPARIN SODIUM 40 MG/0.4ML ~~LOC~~ SOLN
40.0000 mg | Freq: Every day | SUBCUTANEOUS | Status: DC
  Administered 2017-07-15 – 2017-07-17 (×3): 40 mg via SUBCUTANEOUS
  Filled 2017-07-15 (×3): qty 0.4

## 2017-07-15 MED ORDER — MIDAZOLAM HCL 2 MG/2ML IJ SOLN
INTRAMUSCULAR | Status: AC | PRN
  Administered 2017-07-15: 1 mg via INTRAVENOUS

## 2017-07-15 MED ORDER — NALOXONE HCL 0.4 MG/ML IJ SOLN
INTRAMUSCULAR | Status: AC
  Filled 2017-07-15: qty 1

## 2017-07-15 MED ORDER — FLUMAZENIL 0.5 MG/5ML IV SOLN
INTRAVENOUS | Status: AC
  Filled 2017-07-15: qty 5

## 2017-07-15 MED ORDER — FENTANYL CITRATE (PF) 100 MCG/2ML IJ SOLN
INTRAMUSCULAR | Status: AC | PRN
  Administered 2017-07-15: 50 ug via INTRAVENOUS

## 2017-07-15 MED ORDER — FENTANYL CITRATE (PF) 100 MCG/2ML IJ SOLN
INTRAMUSCULAR | Status: AC
  Filled 2017-07-15: qty 4

## 2017-07-15 MED ORDER — MIDAZOLAM HCL 2 MG/2ML IJ SOLN
INTRAMUSCULAR | Status: AC
  Filled 2017-07-15: qty 4

## 2017-07-15 NOTE — Procedures (Signed)
RUL Lung mass Bx 18 g core x 2 for path 18 g core times 1 for Cx EBL 0 Comp 0

## 2017-07-15 NOTE — Sedation Documentation (Signed)
Patient denies pain and is resting comfortably.  

## 2017-07-15 NOTE — Progress Notes (Signed)
Back from Radiology.NAD noted retrotonsillar abscess posterior back CDI bandaid intact

## 2017-07-15 NOTE — Progress Notes (Signed)
To CT

## 2017-07-15 NOTE — Progress Notes (Signed)
ANTICOAGULATION CONSULT NOTE - Initial Consult  Pharmacy Consult for Lovenox Indication: VTE prophylaxis  No Known Allergies  Patient Measurements: Height: 5\' 1"  (154.9 cm) Weight: 118 lb (53.5 kg) IBW/kg (Calculated) : 47.8   Vital Signs: Temp: 98.5 F (36.9 C) (01/17 0613) Temp Source: Oral (01/17 0613) BP: 139/80 (01/17 0613) Pulse Rate: 91 (01/17 0613)  Labs: Recent Labs    07/12/17 1434 07/12/17 2054 07/13/17 0237 07/13/17 0856 07/14/17 0429 07/15/17 0446  HGB 13.9  --  12.9  --   --  11.6*  HCT 43.6  --  40.8  --   --  38.1  PLT 399  --  372  --   --  339  LABPROT  --   --   --   --   --  12.0  INR  --   --   --   --   --  0.90  CREATININE 0.86  --  0.81  --  0.77 1.06*  TROPONINI  --  <0.03 <0.03 <0.03  --   --     Estimated Creatinine Clearance: 36.7 mL/min (A) (by C-G formula based on SCr of 1.06 mg/dL (H)).   Medical History: Past Medical History:  Diagnosis Date  . AAA (abdominal aortic aneurysm) ([place])   . Diverticulosis   . GERD (gastroesophageal reflux disease)   . Hiatal hernia   . Hypertension   . Hypokalemia   . Lumbar spondylolysis    Assessment: 72 yo F sent from urgent care with SOB.   Pharmacy consulted to dose LMWH for VTE px. She was given LMWH 1 mg/kg = 55 mg 1/14 at 2339. CT neg for PE.  Wt 53.5 kg.  CBC WNL. CrCl 37 ml/min  Plan: Adjust LMWH 40 qhs for VTE px per protocol (Dose held 1/16 in anticipation of biopsy today) Pharmacy to sign off  Peggyann Juba, PharmD, BCPS Pager: [phone] 07/15/2017 7:07 AM

## 2017-07-15 NOTE — Progress Notes (Signed)
PCCM continuing to follow.  Await transthoracic needle biopsy.    Noe Gens, NP-C [facility] Pgr: [phone] or if no answer [phone] 07/15/2017, 5:06 PM

## 2017-07-15 NOTE — Care Management Important Message (Addendum)
Important Message  Patient Details IM Letter given to Cookie/Case Manager to present to the Patient Name: Chelsea Baker MRN: [id number] Date of Birth: 1945/08/19   Medicare Important Message Given:  Yes    Kerin Salen 07/15/2017, 11:15 AMImportant Message  Patient Details  Name: Chelsea Baker MRN: [id number] Date of Birth: [birth year]   Medicare Important Message Given:  Yes    Kerin Salen 07/15/2017, 11:15 AM

## 2017-07-15 NOTE — Progress Notes (Signed)
PROGRESS NOTE        PATIENT DETAILS Name: Chelsea Baker Age: 72 y.o. Sex: female Date of Birth: [birth year] Admit Date: 07/12/2017 Admitting Physician Jani Gravel, MD UEA:[id number], Sunny Schlein, DO  Brief Narrative: Patient is a 72 y.o. female history of hypertension, nicotine dependency-presented with a 2-week history of cough, weight loss and was found to have a 3.4 cm cavitary mass in the posterior right upper lobe-highly suspicious for a primary bronchogenic neoplasm.  Seen by [facility] and and interventional radiology in consult, with plans for CT-guided biopsy on 1/17.  See below for further details  Subjective: Sitting up in bed-denies any complaints-not much pain after CT-guided biopsy.  Assessment/Plan: Right upper lobe cavitary lesion: Highly suspicious for a primary lung malignancy-underwent a CT-guided biopsy on 1/17.  She remains on airborne isolation-awaiting AFB smear done on 1/16, also awaiting Gold QuantiFERON.  Suspicion for TB remains very low at this time.  Hyponatremia: Remain stable-continue with fluid restriction.  She could have underlying SIADH from lung malignancy.    Hypertension: Fairly well controlled-continue with metoprolol.  Dyslipidemia: Continue statin  DVT Prophylaxis: Prophylactic Lovenox  Code Status: Full code   Family Communication: None at bedside  Disposition Plan: Remain inpatien  Antimicrobial agents: Anti-infectives (From admission, onward)   None      Procedures: None  CONSULTS:  pulmonary/intensive care  Time spent: 25- minutes-Greater than 50% of this time was spent in counseling, explanation of diagnosis, planning of further management, and coordination of care.  MEDICATIONS: Scheduled Meds: . enoxaparin (LOVENOX) injection  40 mg Subcutaneous QHS  . feeding supplement (ENSURE ENLIVE)  237 mL Oral TID BM  . fentaNYL      . mouth rinse  15 mL Mouth Rinse BID  . metoprolol tartrate  50 mg Oral BID    . midazolam      . simvastatin  40 mg Oral QHS   Continuous Infusions: PRN Meds:.acetaminophen **OR** acetaminophen, diphenhydrAMINE, ondansetron (ZOFRAN) IV, oxyCODONE-acetaminophen   PHYSICAL EXAM: Vital signs: Vitals:   07/15/17 0837 07/15/17 0841 07/15/17 0850 07/15/17 0905  BP: (!) 170/84 137/79  (!) 181/84  Pulse: 91 84  92  Resp: (!) 9 14  16   Temp:      TempSrc:      SpO2: 99% 97% 97% 97%  Weight:      Height:       Filed Weights   07/13/17 0500 07/14/17 0449 07/15/17 0613  Weight: 53.9 kg (118 lb 12.8 oz) 53.6 kg (118 lb 2.7 oz) 53.5 kg (118 lb)   Body mass index is 22.3 kg/m.   General appearance :Awake, alert, not in any distress.  Eyes:, pupils equally reactive to light and accomodation,no scleral icterus. HEENT: Atraumatic and Normocephalic Neck: supple, no JVD. Resp:Good air entry bilaterally, no rales or rhonchi CVS: S1 S2 regular, no murmurs.  GI: Bowel sounds present, Non tender and not distended with no gaurding, rigidity or rebound. Extremities: B/L Lower Ext shows no edema, both legs are warm to touch Neurology:  speech clear,Non focal, sensation is grossly intact. Psychiatric: Normal judgment and insight. Normal mood. Musculoskeletal:No digital cyanosis Skin:No Rash, warm and dry Wounds:N/A  I have personally reviewed following labs and imaging studies  LABORATORY DATA: CBC: Recent Labs  Lab 07/12/17 1434 07/13/17 0237 07/15/17 0446  WBC 8.5 9.4 8.2  NEUTROABS 6.3  --  6.3  HGB 13.9 12.9 11.6*  HCT 43.6 40.8 38.1  MCV 84.8 85.4 86.8  PLT 399 372 833    Basic Metabolic Panel: Recent Labs  Lab 07/12/17 1434 07/13/17 0237 07/14/17 0429 07/15/17 0446  NA 132* 132* 132* 130*  K 4.0 3.8 4.7 4.3  CL 91* 94* 93* 91*  CO2 30 29 32 32  GLUCOSE 117* 97 108* 130*  BUN 12 12 9 15   CREATININE 0.86 0.81 0.77 1.06*  CALCIUM 9.9 9.2 9.6 9.3    GFR: Estimated Creatinine Clearance: 36.7 mL/min (A) (by C-G formula based on SCr of 1.06  mg/dL (H)).  Liver Function Tests: Recent Labs  Lab 07/13/17 0237  AST 20  ALT 12*  ALKPHOS 72  BILITOT 0.7  PROT 6.7  ALBUMIN 3.6   No results for input(s): LIPASE, AMYLASE in the last 168 hours. No results for input(s): AMMONIA in the last 168 hours.  Coagulation Profile: Recent Labs  Lab 07/15/17 0446  INR 0.90    Cardiac Enzymes: Recent Labs  Lab 07/12/17 2054 07/13/17 0237 07/13/17 0856  TROPONINI <0.03 <0.03 <0.03    BNP (last 3 results) No results for input(s): PROBNP in the last 8760 hours.  HbA1C: No results for input(s): HGBA1C in the last 72 hours.  CBG: Recent Labs  Lab 07/14/17 0846 07/14/17 1210 07/14/17 1647  GLUCAP 90 113* 126*    Lipid Profile: No results for input(s): CHOL, HDL, LDLCALC, TRIG, CHOLHDL, LDLDIRECT in the last 72 hours.  Thyroid Function Tests: Recent Labs    07/13/17 0237  TSH 2.010    Anemia Panel: No results for input(s): VITAMINB12, FOLATE, FERRITIN, TIBC, IRON, RETICCTPCT in the last 72 hours.  Urine analysis:    Component Value Date/Time   COLORURINE YELLOW 07/12/2017 [place] 07/12/2017 1304   LABSPEC 1.013 07/12/2017 1304   PHURINE 5.0 07/12/2017 1304   GLUCOSEU NEGATIVE 07/12/2017 1304   HGBUR NEGATIVE 07/12/2017 1304   BILIRUBINUR NEGATIVE 07/12/2017 1304   KETONESUR 5 (A) 07/12/2017 1304   PROTEINUR 30 (A) 07/12/2017 1304   UROBILINOGEN 0.2 09/25/2010 0357   NITRITE NEGATIVE 07/12/2017 1304   LEUKOCYTESUR TRACE (A) 07/12/2017 1304    Sepsis Labs: Lactic Acid, Venous    Component Value Date/Time   LATICACIDVEN 1.1 09/27/2010 2001    MICROBIOLOGY: No results found for this or any previous visit (from the past 240 hour(s)).  RADIOLOGY STUDIES/RESULTS: Dg Chest 2 View  Result Date: 07/12/2017 CLINICAL DATA:  Productive cough, fatigue and short of breath EXAM: CHEST  2 VIEW COMPARISON:  10/12/2010 radiograph FINDINGS: Approximate 3.4 cm right upper lobe lung mass, possibly  with cavitation on the lateral view. No pleural effusion. Normal heart size. Aortic atherosclerosis. No pneumothorax. Partially visualized distal aortic stent. IMPRESSION: 1. Approximate 3.4 cm right upper lobe lung mass, possibly cavitary. Recommend CT chest for further evaluation. Electronically Signed   By: Donavan Foil M.D.   On: 07/12/2017 14:06   Ct Chest W Contrast  Addendum Date: 07/13/2017   ADDENDUM REPORT: 07/13/2017 07:56 ADDENDUM: I just spoke with Dr. Tessa Lerner by telephone and was asked to review this exam for the presence of pulmonary embolus. I reviewed the images with focused attention on the pulmonary arteries. There is no filling defect in the main pulmonary outflow tract, either main pulmonary artery, or lobar pulmonary arteries to either lung. Pulmonary arterial opacification is insufficient to exclude distal segmental and subsegmental embolus. Electronically Signed   By: Misty Stanley M.D.   On:  07/13/2017 07:56   Result Date: 07/13/2017 CLINICAL DATA:  Right upper lobe lung mass on chest x-ray earlier today. Patient presented to the emergency department with productive cough, fatigue and shortness of breath. EXAM: CT CHEST WITH CONTRAST TECHNIQUE: Multidetector CT imaging of the chest was performed during intravenous contrast administration. CONTRAST:  38mL ISOVUE-300 IOPAMIDOL INJECTION 61% IV. COMPARISON:  No prior CT.  Chest x-ray earlier today and 10/12/2010. FINDINGS: Cardiovascular: Normal heart size. Mild three-vessel coronary atherosclerosis. No pericardial effusion. Severe atherosclerosis involving the thoracic and upper abdominal aorta. The extreme proximal portion of the abdominal aortic stent graft is visible and is patent. Mediastinum/Nodes: Normal sized mediastinal and hilar lymph nodes, the largest a right paratracheal node (station 4R) measuring approximately 1.6 x 1.0 cm. Gas-filled esophagus. No evidence of hiatal hernia. Thyroid gland unremarkable. Lungs/Pleura:  Spiculated mass involving the right upper lobe with maximum measurements approximating 3.5 x 3.0 x 3.5 cm, cavitary with gas and fluid centrally. Approximate 0.4 x 0.3 cm nodule involving the superior segment right lower lobe (series 5, image 63). No nodules or masses elsewhere in either lung. Emphysematous changes throughout both lungs. Scar/bronchiectasis involving the lingula. No pleural effusions. Upper Abdomen: Diffuse hepatic steatosis without focal parenchymal abnormality involving the visualized liver. Visualized upper abdomen otherwise unremarkable. Musculoskeletal: Degenerative disc disease and spondylosis involving the thoracic spine in the visualized lower cervical spine. No acute osseous abnormality. No evidence of osseous metastatic disease. IMPRESSION: 1. Approximate 3.5 cm cavitary mass involving the right upper lobe, most likely indicating a primary bronchogenic carcinoma. 2. Indeterminate 4 mm nodule involving the superior segment right lower lobe. Attention to this nodule on subsequent imaging is recommended. 3. No evidence of metastatic disease otherwise involving the chest or upper abdomen. 4. Diffuse hepatic steatosis without focal parenchymal abnormality involving the visualized liver. Aortic Atherosclerosis (ICD10-I70.0) and Emphysema (ICD10-J43.9). Electronically Signed: By: Evangeline Dakin M.D. On: 07/12/2017 16:31   Ct Angio Chest Pe W Or Wo Contrast  Result Date: 07/13/2017 CLINICAL DATA:  Shortness of breath, evaluate for PE. Cavitary right upper lobe mass. EXAM: CT ANGIOGRAPHY CHEST WITH CONTRAST TECHNIQUE: Multidetector CT imaging of the chest was performed using the standard protocol during bolus administration of intravenous contrast. Multiplanar CT image reconstructions and MIPs were obtained to evaluate the vascular anatomy. CONTRAST:  12mL ISOVUE-370 IOPAMIDOL (ISOVUE-370) INJECTION 76% COMPARISON:  CT chest dated 07/12/2017. FINDINGS: Cardiovascular: Satisfactory  opacification of the bilateral pulmonary artery to the segmental level. No evidence of pulmonary embolism. No evidence of thoracic aortic aneurysm. Atherosclerotic calcifications of the aortic arch. The heart is normal in size.  No pericardial effusion. Otherwise, unchanged from recent CT yesterday. Mediastinum/Nodes: Small mediastinal lymph nodes measuring up to 7 mm short axis. No suspicious hilar or axillary lymphadenopathy. Visualized thyroid is unremarkable. Lungs/Pleura: 3.4 x 3.3 cm partially cavitary mass in the posterior right upper lobe (series 10/image 36), suspicious for primary bronchogenic neoplasm. Underlying mild centrilobular emphysematous changes. 4 mm satellite nodule in the superior segment right lower lobe (series 10/image 61). Additional mild nodularity anteriorly in the left upper lobe (series 10/image 69), possibly infectious. No pleural effusion or pneumothorax. Upper Abdomen: Visualized upper abdomen is notable for an incompletely visualized abdominal aortic stent. Musculoskeletal: Mild degenerative changes of the visualized thoracolumbar spine. Review of the MIP images confirms the above findings. IMPRESSION: No evidence of pulmonary embolism. Otherwise unchanged from recent CT chest. 3.4 cm partially cavitary mass in the posterior right upper lobe, suspicious for primary bronchogenic neoplasm. Aortic Atherosclerosis (ICD10-I70.0) and  Emphysema (ICD10-J43.9). Electronically Signed   By: Julian Hy M.D.   On: 07/13/2017 10:53   Dg Chest Port 1 View  Result Date: 07/15/2017 CLINICAL DATA:  Status post lung biopsy EXAM: PORTABLE CHEST 1 VIEW COMPARISON:  Chest x-ray of July 12, 2017 FINDINGS: The soft tissue mass in the posterior aspect of the right upper lobe is again demonstrated. There is no post biopsy pneumothorax or hemothorax. The left lung is well-expanded and clear. The heart and pulmonary vascularity are normal. The mediastinum is normal in width. There is mild  multilevel degenerative disc disease of the thoracic spine. IMPRESSION: No postprocedure complication following percutaneous biopsy of the right upper lobe mass. Electronically Signed   By: David  Martinique M.D.   On: 07/15/2017 10:01     LOS: 3 days   Oren Binet, MD  [facility] Pager:[phone]  If 7PM-7AM, please contact night-coverage [email or web address] Password [email or web address] 07/15/2017, 11:07 AM

## 2017-07-16 DIAGNOSIS — E46 Unspecified protein-calorie malnutrition: Secondary | ICD-10-CM

## 2017-07-16 DIAGNOSIS — R0602 Shortness of breath: Secondary | ICD-10-CM

## 2017-07-16 DIAGNOSIS — C3411 Malignant neoplasm of upper lobe, right bronchus or lung: Principal | ICD-10-CM

## 2017-07-16 DIAGNOSIS — F17200 Nicotine dependence, unspecified, uncomplicated: Secondary | ICD-10-CM

## 2017-07-16 LAB — BASIC METABOLIC PANEL
Anion gap: 8 (ref 5–15)
BUN: 14 mg/dL (ref 6–20)
CO2: 34 mmol/L — ABNORMAL HIGH (ref 22–32)
CREATININE: 0.95 mg/dL (ref 0.44–1.00)
Calcium: 9.3 mg/dL (ref 8.9–10.3)
Chloride: 90 mmol/L — ABNORMAL LOW (ref 101–111)
GFR, EST NON AFRICAN AMERICAN: 59 mL/min — AB (ref >60)
Glucose, Bld: 122 mg/dL — ABNORMAL HIGH (ref 65–99)
POTASSIUM: 4.4 mmol/L (ref 3.5–5.1)
Sodium: 132 mmol/L — ABNORMAL LOW (ref 135–145)

## 2017-07-16 LAB — QUANTIFERON-TB GOLD PLUS (RQFGPL)
QUANTIFERON TB1 AG VALUE: 0.03 [IU]/mL
QuantiFERON Mitogen Value: 1.12 IU/mL
QuantiFERON Nil Value: 0.04 IU/mL
QuantiFERON TB2 Ag Value: 0.02 IU/mL

## 2017-07-16 LAB — ACID FAST SMEAR (AFB, MYCOBACTERIA): Acid Fast Smear: NEGATIVE

## 2017-07-16 LAB — ACID FAST SMEAR (AFB)

## 2017-07-16 LAB — QUANTIFERON-TB GOLD PLUS: QuantiFERON-TB Gold Plus: NEGATIVE

## 2017-07-16 NOTE — Progress Notes (Signed)
PROGRESS NOTE        PATIENT DETAILS Name: Chelsea Baker Age: 72 y.o. Sex: female Date of Birth: 10-05-1945 Admit Date: 07/12/2017 Admitting Physician Jani Gravel, MD ZRA:[id number], Sunny Schlein, DO  Brief Narrative: Patient is a 72 y.o. female history of hypertension, nicotine dependency-presented with a 2-week history of cough, weight loss and was found to have a 3.4 cm cavitary mass in the posterior right upper lobe-highly suspicious for a primary bronchogenic neoplasm.  Seen by PCCM and and interventional radiology in consult, subsequently underwent CT-guided biopsy on 1/17.  See below for further details  Subjective: Sitting up in bed-no chest pain, no nausea no vomiting or diarrhea.  Assessment/Plan: Right upper lobe cavitary lesion: Biopsy on 1/17 confirmed squamous cell carcinoma.  Initially there was some concern for TB-hence placed on isolation, AFB smear x1 is negative.  Gold QuantiFERON is pending.  Since biopsy has confirmed squamous cell carcinoma-doubt airborne isolation is required-Case was discussed with infectious disease over the phone as well.  We will go ahead and stop airborne isolation.  Have consulted oncology (Dr [name]) who will see her later today, and arrange for further workup/staging/treatment to be pursued in the outpatient setting. Marland Kitchen  Hyponatremia: Appears mild-stable with fluid restriction.  Suspicion for mild SIADH from underlying malignancy.   Hypertension: Controlled with metoprolol  Dyslipidemia: Continue statin  DVT Prophylaxis: Prophylactic Lovenox  Code Status: Full code   Family Communication: None at bedside  Disposition Plan: Remain inpatient-home soon after oncology evaluation   Antimicrobial agents: Anti-infectives (From admission, onward)   None     Procedures: None  CONSULTS:  pulmonary/intensive care  Time spent: 25- minutes-Greater than 50% of this time was spent in counseling, explanation of  diagnosis, planning of further management, and coordination of care.  MEDICATIONS: Scheduled Meds: . enoxaparin (LOVENOX) injection  40 mg Subcutaneous QHS  . feeding supplement (ENSURE ENLIVE)  237 mL Oral TID BM  . mouth rinse  15 mL Mouth Rinse BID  . metoprolol tartrate  50 mg Oral BID  . simvastatin  40 mg Oral QHS   Continuous Infusions: PRN Meds:.acetaminophen **OR** acetaminophen, diphenhydrAMINE, ondansetron (ZOFRAN) IV, oxyCODONE-acetaminophen   PHYSICAL EXAM: Vital signs: Vitals:   07/15/17 1426 07/15/17 1430 07/15/17 2207 07/16/17 0554  BP: (!) 92/59 (!) 104/57 (!) 159/82 (!) 166/85  Pulse: 72  90 80  Resp: 17  18 18   Temp: 98.4 F (36.9 C)  98 F (36.7 C) 98.7 F (37.1 C)  TempSrc: Oral  Oral Oral  SpO2: 93%  92% 92%  Weight:    53.3 kg (117 lb 8.1 oz)  Height:       Filed Weights   07/14/17 0449 07/15/17 0613 07/16/17 0554  Weight: 53.6 kg (118 lb 2.7 oz) 53.5 kg (118 lb) 53.3 kg (117 lb 8.1 oz)   Body mass index is 22.2 kg/m.   General appearance :Awake, alert, not in any distress.  Eyes:, pupils equally reactive to light and accomodation,no scleral icterus. HEENT: Atraumatic and Normocephalic Neck: supple, no JVD. Resp:Good air entry bilaterally, no rales or rhonchi. CVS: S1 S2 regular, no murmurs.  GI: Bowel sounds present, Non tender and not distended with no gaurding, rigidity or rebound. Extremities: B/L Lower Ext shows no edema, both legs are warm to touch Neurology:  speech clear,Non focal, sensation is grossly intact. Psychiatric: Normal judgment and insight.  Normal mood. Musculoskeletal:No digital cyanosis Skin:No Rash, warm and dry Wounds:N/A  I have personally reviewed following labs and imaging studies  LABORATORY DATA: CBC: Recent Labs  Lab 07/12/17 1434 07/13/17 0237 07/15/17 0446  WBC 8.5 9.4 8.2  NEUTROABS 6.3  --  6.3  HGB 13.9 12.9 11.6*  HCT 43.6 40.8 38.1  MCV 84.8 85.4 86.8  PLT 399 372 174    Basic Metabolic  Panel: Recent Labs  Lab 07/12/17 1434 07/13/17 0237 07/14/17 0429 07/15/17 0446 07/16/17 0433  NA 132* 132* 132* 130* 132*  K 4.0 3.8 4.7 4.3 4.4  CL 91* 94* 93* 91* 90*  CO2 30 29 32 32 34*  GLUCOSE 117* 97 108* 130* 122*  BUN 12 12 9 15 14   CREATININE 0.86 0.81 0.77 1.06* 0.95  CALCIUM 9.9 9.2 9.6 9.3 9.3    GFR: Estimated Creatinine Clearance: 41 mL/min (by C-G formula based on SCr of 0.95 mg/dL).  Liver Function Tests: Recent Labs  Lab 07/13/17 0237  AST 20  ALT 12*  ALKPHOS 72  BILITOT 0.7  PROT 6.7  ALBUMIN 3.6   No results for input(s): LIPASE, AMYLASE in the last 168 hours. No results for input(s): AMMONIA in the last 168 hours.  Coagulation Profile: Recent Labs  Lab 07/15/17 0446  INR 0.90    Cardiac Enzymes: Recent Labs  Lab 07/12/17 2054 07/13/17 0237 07/13/17 0856  TROPONINI <0.03 <0.03 <0.03    BNP (last 3 results) No results for input(s): PROBNP in the last 8760 hours.  HbA1C: No results for input(s): HGBA1C in the last 72 hours.  CBG: Recent Labs  Lab 07/14/17 0846 07/14/17 1210 07/14/17 1647  GLUCAP 90 113* 126*    Lipid Profile: No results for input(s): CHOL, HDL, LDLCALC, TRIG, CHOLHDL, LDLDIRECT in the last 72 hours.  Thyroid Function Tests: No results for input(s): TSH, T4TOTAL, FREET4, T3FREE, THYROIDAB in the last 72 hours.  Anemia Panel: No results for input(s): VITAMINB12, FOLATE, FERRITIN, TIBC, IRON, RETICCTPCT in the last 72 hours.  Urine analysis:    Component Value Date/Time   COLORURINE YELLOW 07/12/2017 [place] 07/12/2017 1304   LABSPEC 1.013 07/12/2017 1304   PHURINE 5.0 07/12/2017 1304   GLUCOSEU NEGATIVE 07/12/2017 1304   HGBUR NEGATIVE 07/12/2017 1304   BILIRUBINUR NEGATIVE 07/12/2017 1304   KETONESUR 5 (A) 07/12/2017 1304   PROTEINUR 30 (A) 07/12/2017 1304   UROBILINOGEN 0.2 09/25/2010 0357   NITRITE NEGATIVE 07/12/2017 1304   LEUKOCYTESUR TRACE (A) 07/12/2017 1304     Sepsis Labs: Lactic Acid, Venous    Component Value Date/Time   LATICACIDVEN 1.1 09/27/2010 2001    MICROBIOLOGY: Recent Results (from the past 240 hour(s))  Acid Fast Smear (AFB)     Status: None   Collection Time: 07/14/17  5:00 AM  Result Value Ref Range Status   AFB Specimen Processing Concentration  Final   Acid Fast Smear Negative  Final    Comment: (NOTE) Performed At: [facility] [place], [place] [id number] Rush Farmer MD UD:[id number]    Source (AFB) SPUTUM  Final    RADIOLOGY STUDIES/RESULTS: Dg Chest 2 View  Result Date: 07/12/2017 CLINICAL DATA:  Productive cough, fatigue and short of breath EXAM: CHEST  2 VIEW COMPARISON:  10/12/2010 radiograph FINDINGS: Approximate 3.4 cm right upper lobe lung mass, possibly with cavitation on the lateral view. No pleural effusion. Normal heart size. Aortic atherosclerosis. No pneumothorax. Partially visualized distal aortic stent. IMPRESSION: 1. Approximate 3.4 cm  right upper lobe lung mass, possibly cavitary. Recommend CT chest for further evaluation. Electronically Signed   By: Donavan Foil M.D.   On: 07/12/2017 14:06   Ct Chest W Contrast  Addendum Date: 07/13/2017   ADDENDUM REPORT: 07/13/2017 07:56 ADDENDUM: I just spoke with Dr. Tessa Lerner by telephone and was asked to review this exam for the presence of pulmonary embolus. I reviewed the images with focused attention on the pulmonary arteries. There is no filling defect in the main pulmonary outflow tract, either main pulmonary artery, or lobar pulmonary arteries to either lung. Pulmonary arterial opacification is insufficient to exclude distal segmental and subsegmental embolus. Electronically Signed   By: Misty Stanley M.D.   On: 07/13/2017 07:56   Result Date: 07/13/2017 CLINICAL DATA:  Right upper lobe lung mass on chest x-ray earlier today. Patient presented to the emergency department with productive cough, fatigue and shortness of breath.  EXAM: CT CHEST WITH CONTRAST TECHNIQUE: Multidetector CT imaging of the chest was performed during intravenous contrast administration. CONTRAST:  27mL ISOVUE-300 IOPAMIDOL INJECTION 61% IV. COMPARISON:  No prior CT.  Chest x-ray earlier today and 10/12/2010. FINDINGS: Cardiovascular: Normal heart size. Mild three-vessel coronary atherosclerosis. No pericardial effusion. Severe atherosclerosis involving the thoracic and upper abdominal aorta. The extreme proximal portion of the abdominal aortic stent graft is visible and is patent. Mediastinum/Nodes: Normal sized mediastinal and hilar lymph nodes, the largest a right paratracheal node (station 4R) measuring approximately 1.6 x 1.0 cm. Gas-filled esophagus. No evidence of hiatal hernia. Thyroid gland unremarkable. Lungs/Pleura: Spiculated mass involving the right upper lobe with maximum measurements approximating 3.5 x 3.0 x 3.5 cm, cavitary with gas and fluid centrally. Approximate 0.4 x 0.3 cm nodule involving the superior segment right lower lobe (series 5, image 63). No nodules or masses elsewhere in either lung. Emphysematous changes throughout both lungs. Scar/bronchiectasis involving the lingula. No pleural effusions. Upper Abdomen: Diffuse hepatic steatosis without focal parenchymal abnormality involving the visualized liver. Visualized upper abdomen otherwise unremarkable. Musculoskeletal: Degenerative disc disease and spondylosis involving the thoracic spine in the visualized lower cervical spine. No acute osseous abnormality. No evidence of osseous metastatic disease. IMPRESSION: 1. Approximate 3.5 cm cavitary mass involving the right upper lobe, most likely indicating a primary bronchogenic carcinoma. 2. Indeterminate 4 mm nodule involving the superior segment right lower lobe. Attention to this nodule on subsequent imaging is recommended. 3. No evidence of metastatic disease otherwise involving the chest or upper abdomen. 4. Diffuse hepatic steatosis  without focal parenchymal abnormality involving the visualized liver. Aortic Atherosclerosis (ICD10-I70.0) and Emphysema (ICD10-J43.9). Electronically Signed: By: Evangeline Dakin M.D. On: 07/12/2017 16:31   Ct Angio Chest Pe W Or Wo Contrast  Result Date: 07/13/2017 CLINICAL DATA:  Shortness of breath, evaluate for PE. Cavitary right upper lobe mass. EXAM: CT ANGIOGRAPHY CHEST WITH CONTRAST TECHNIQUE: Multidetector CT imaging of the chest was performed using the standard protocol during bolus administration of intravenous contrast. Multiplanar CT image reconstructions and MIPs were obtained to evaluate the vascular anatomy. CONTRAST:  41mL ISOVUE-370 IOPAMIDOL (ISOVUE-370) INJECTION 76% COMPARISON:  CT chest dated 07/12/2017. FINDINGS: Cardiovascular: Satisfactory opacification of the bilateral pulmonary artery to the segmental level. No evidence of pulmonary embolism. No evidence of thoracic aortic aneurysm. Atherosclerotic calcifications of the aortic arch. The heart is normal in size.  No pericardial effusion. Otherwise, unchanged from recent CT yesterday. Mediastinum/Nodes: Small mediastinal lymph nodes measuring up to 7 mm short axis. No suspicious hilar or axillary lymphadenopathy. Visualized thyroid is unremarkable. Lungs/Pleura: 3.4  x 3.3 cm partially cavitary mass in the posterior right upper lobe (series 10/image 36), suspicious for primary bronchogenic neoplasm. Underlying mild centrilobular emphysematous changes. 4 mm satellite nodule in the superior segment right lower lobe (series 10/image 61). Additional mild nodularity anteriorly in the left upper lobe (series 10/image 69), possibly infectious. No pleural effusion or pneumothorax. Upper Abdomen: Visualized upper abdomen is notable for an incompletely visualized abdominal aortic stent. Musculoskeletal: Mild degenerative changes of the visualized thoracolumbar spine. Review of the MIP images confirms the above findings. IMPRESSION: No evidence of  pulmonary embolism. Otherwise unchanged from recent CT chest. 3.4 cm partially cavitary mass in the posterior right upper lobe, suspicious for primary bronchogenic neoplasm. Aortic Atherosclerosis (ICD10-I70.0) and Emphysema (ICD10-J43.9). Electronically Signed   By: Julian Hy M.D.   On: 07/13/2017 10:53   Ct Biopsy  Result Date: 07/15/2017 INDICATION: Right upper lobe lung mass EXAM: CT BIOPSY MEDICATIONS: None. ANESTHESIA/SEDATION: Fentanyl 50 mcg IV; Versed 1 mg IV Moderate Sedation Time:  11 minutes The patient was continuously monitored during the procedure by the interventional radiology nurse under my direct supervision. FLUOROSCOPY TIME:  Fluoroscopy Time:  minutes  seconds ( mGy). COMPLICATIONS: None immediate. PROCEDURE: Informed written consent was obtained from the patient after a thorough discussion of the procedural risks, benefits and alternatives. All questions were addressed. Maximal Sterile Barrier Technique was utilized including caps, mask, sterile gowns, sterile gloves, sterile drape, hand hygiene and skin antiseptic. A timeout was performed prior to the initiation of the procedure. Under CT guidance, a 17 gauge needle was inserted into the left upper lobe lung mass. Three cores were obtained. Two were placed in formalin. One was placed in saline for tissue culture and AFB analysis. Biosintry plug was deployed and the guide needle was removed. Post biopsy images demonstrate no pneumothorax. Patient tolerated the procedure well without complication. Vital sign monitoring by nursing staff during the procedure will continue as patient is in the special procedures unit for post procedure observation. FINDINGS: The images document guide needle placement within the right upper lobe lung mass. Post biopsy images demonstrate no hemorrhage. IMPRESSION: Successful CT-guided right upper lobe lung mass core biopsy. Electronically Signed   By: Marybelle Killings M.D.   On: 07/15/2017 11:26   Dg Chest  Port 1 View  Result Date: 07/15/2017 CLINICAL DATA:  Status post lung biopsy EXAM: PORTABLE CHEST 1 VIEW COMPARISON:  Chest x-ray of July 12, 2017 FINDINGS: The soft tissue mass in the posterior aspect of the right upper lobe is again demonstrated. There is no post biopsy pneumothorax or hemothorax. The left lung is well-expanded and clear. The heart and pulmonary vascularity are normal. The mediastinum is normal in width. There is mild multilevel degenerative disc disease of the thoracic spine. IMPRESSION: No postprocedure complication following percutaneous biopsy of the right upper lobe mass. Electronically Signed   By: David  Martinique M.D.   On: 07/15/2017 10:01     LOS: 4 days   Oren Binet, MD  [facility] Pager:[phone]  If 7PM-7AM, please contact night-coverage [email or web address] Password [email or web address] 07/16/2017, 2:20 PM

## 2017-07-16 NOTE — Progress Notes (Signed)
PCCM Interval Note  A review of her labs shows that her pathology has returned squamous cell carcinoma.  Please arrange for her to see the multidisciplinary thoracic oncology clinic as an outpatient or have oncology see her while she is admitted here to plan next steps in the staging workup and to plan therapy.  Please call us if we can assist you  Baltazar Apo, MD, PhD 07/16/2017, 2:17 PM [facility] [phone] or if no answer [phone]

## 2017-07-16 NOTE — Consult Note (Signed)
Marland Kitchen    HEMATOLOGY/ONCOLOGY CONSULTATION NOTE  Date of Service: 07/16/2017  Patient Care Team: Karma Greaser, DO as PCP - General (Family Medicine)  CHIEF COMPLAINTS/PURPOSE OF CONSULTATION:  Newly diagnosed squamous cell carcinoma of the lung  HISTORY OF PRESENTING ILLNESS:   Chelsea Baker is a wonderful 72 y.o. female who has been referred to [place] by Dr .Sloan Leiter, Henreitta Leber, MD  for evaluation and management of newly diagnosed Squmous cell carcinoma of the RUL.  Patient has a history of hypertension, nicotine dependence, GERD, hiatal hernia, AAA and presented with a 2-week history of cough, weight loss about 20 pounds in about 6 months and increasing shortness of breath over the last several days prior to admission.  In the ED the patient had a chest x-ray which showed a 3.4 cm Which showed a 3.4 cm right upper lobe mass which appeared cavitary.  Subsequent CT of the chest showed a 3.5 cm cavitary mass involving the right upper lobe concerning for a primary bronchogenic carcinoma.  She was also noted to have a 4 mm nodule which is indeterminate in the right lower lobe.  No evidence of metastatic disease noted in the chest or upper abdomen. No evidence of pulmonary embolism.  Patient subsequently had a CT-guided biopsy of the right upper lobe lung lesion on 07/15/2017.  Pathology revealed squamous cell carcinoma of the lung. She had other workup including an AFB smear which was negative.  Patient notes that she does not really have a primary care physician and has not had good medical follow-up and does not really see a doctor regularly. She has had a history of smoking 1/2-2 packs/day for 50 years.  No diagnosed COPD though likely has significant decline in lung function at baseline. Currently was short of breath despite lung cancer not involving a large part of the lung at this time and no evidence of PE.  Still needing 2-3 L of oxygen by nasal cannula currently and endorses shortness  of breath with minimal walking.  She notes that she lives with her son.  MEDICAL HISTORY:  Past Medical History:  Diagnosis Date  . AAA (abdominal aortic aneurysm) ([place])   . Diverticulosis   . GERD (gastroesophageal reflux disease)   . Hiatal hernia   . Hypertension   . Hypokalemia   . Lumbar spondylolysis     SURGICAL HISTORY: Past Surgical History:  Procedure Laterality Date  . ABDOMINAL AORTIC ANEURYSM REPAIR  10/01/10   endostent repair    SOCIAL HISTORY: Social History   Socioeconomic History  . Marital status: Single    Spouse name: Not on file  . Number of children: Not on file  . Years of education: Not on file  . Highest education level: Not on file  Social Needs  . Financial resource strain: Not on file  . Food insecurity - worry: Not on file  . Food insecurity - inability: Not on file  . Transportation needs - medical: Not on file  . Transportation needs - non-medical: Not on file  Occupational History  . Not on file  Tobacco Use  . Smoking status: Current Every Day Smoker    Packs/day: 1.00    Years: 50.00    Pack years: 50.00    Types: Cigarettes  . Smokeless tobacco: Never Used  Substance and Sexual Activity  . Alcohol use: Yes    Comment: occasionally  . Drug use: No  . Sexual activity: Not on file  Other Topics Concern  . Not  on file  Social History Narrative  . Not on file    FAMILY HISTORY: Family History  Problem Relation Age of Onset  . Heart disease Mother   . Heart failure Mother   . Heart disease Father   . Coronary artery disease Father     ALLERGIES:  has No Known Allergies.  MEDICATIONS:  Current Facility-Administered Medications  Medication Dose Route Frequency Provider Last Rate Last Dose  . acetaminophen (TYLENOL) tablet 650 mg  650 mg Oral Q6H PRN Jani Gravel, MD   650 mg at 07/15/17 1000   Or  . acetaminophen (TYLENOL) suppository 650 mg  650 mg Rectal Q6H PRN Jani Gravel, MD      . diphenhydrAMINE (BENADRYL)  capsule 25 mg  25 mg Oral QHS PRN Jani Gravel, MD      . enoxaparin (LOVENOX) injection 40 mg  40 mg Subcutaneous QHS Jonetta Osgood, MD   40 mg at 07/15/17 2321  . feeding supplement (ENSURE ENLIVE) (ENSURE ENLIVE) liquid 237 mL  237 mL Oral TID BM Janece Canterbury, MD   237 mL at 07/14/17 1205  . MEDLINE mouth rinse  15 mL Mouth Rinse BID Jani Gravel, MD   15 mL at 07/16/17 0819  . metoprolol tartrate (LOPRESSOR) tablet 50 mg  50 mg Oral BID Janece Canterbury, MD   50 mg at 07/16/17 0819  . ondansetron (ZOFRAN) injection 4 mg  4 mg Intravenous Q6H PRN Schorr, Rhetta Mura, NP   4 mg at 07/16/17 1509  . oxyCODONE-acetaminophen (PERCOCET/ROXICET) 5-325 MG per tablet 1 tablet  1 tablet Oral Q6H PRN Janece Canterbury, MD   1 tablet at 07/16/17 1508  . simvastatin (ZOCOR) tablet 40 mg  40 mg Oral QHS Jani Gravel, MD   40 mg at 07/15/17 2321    REVIEW OF SYSTEMS:    10 Point review of Systems was done is negative except as noted above.  PHYSICAL EXAMINATION: ECOG PERFORMANCE STATUS: 2-3  . Vitals:   07/16/17 0554 07/16/17 1300  BP: (!) 166/85 (!) 131/115  Pulse: 80 71  Resp: 18 18  Temp: 98.7 F (37.1 C) 97.8 F (36.6 C)  SpO2: 92% 93%   Filed Weights   07/14/17 0449 07/15/17 0613 07/16/17 0554  Weight: 118 lb 2.7 oz (53.6 kg) 118 lb (53.5 kg) 117 lb 8.1 oz (53.3 kg)   .Body mass index is 22.2 kg/m.  GENERAL:alert, in no acute distress and comfortable SKIN: no acute rashes, no significant lesions EYES: conjunctiva are pink and non-injected, sclera anicteric OROPHARYNX: MMM, no exudates, no oropharyngeal erythema or ulceration NECK: supple, no JVD LYMPH:  no palpable lymphadenopathy in the cervical, axillary or inguinal regions LUNGS: b/l decreased air entry in the lung with scattered wheezing. HEART: regular rate & rhythm ABDOMEN:  normoactive bowel sounds , non tender, not distended. Extremity: no pedal edema PSYCH: alert & oriented x 3 with fluent speech NEURO: no focal  motor/sensory deficits  LABORATORY DATA:  I have reviewed the data as listed  . CBC Latest Ref Rng & Units 07/15/2017 07/13/2017 07/12/2017  WBC 4.0 - 10.5 K/uL 8.2 9.4 8.5  Hemoglobin 12.0 - 15.0 g/dL 11.6(L) 12.9 13.9  Hematocrit 36.0 - 46.0 % 38.1 40.8 43.6  Platelets 150 - 400 K/uL 339 372 399    . CMP Latest Ref Rng & Units 07/17/2017 07/16/2017 07/15/2017  Glucose 65 - 99 mg/dL 118(H) 122(H) 130(H)  BUN 6 - 20 mg/dL 13 14 15   Creatinine 0.44 - 1.00 mg/dL 0.94  0.95 1.06(H)  Sodium 135 - 145 mmol/L 134(L) 132(L) 130(L)  Potassium 3.5 - 5.1 mmol/L 4.2 4.4 4.3  Chloride 101 - 111 mmol/L 90(L) 90(L) 91(L)  CO2 22 - 32 mmol/L 35(H) 34(H) 32  Calcium 8.9 - 10.3 mg/dL 9.5 9.3 9.3  Total Protein 6.5 - 8.1 g/dL - - -  Total Bilirubin 0.3 - 1.2 mg/dL - - -  Alkaline Phos 38 - 126 U/L - - -  AST 15 - 41 U/L - - -  ALT 14 - 54 U/L - - -     RADIOGRAPHIC STUDIES: I have personally reviewed the radiological images as listed and agreed with the findings in the report. Dg Chest 2 View  Result Date: 07/12/2017 CLINICAL DATA:  Productive cough, fatigue and short of breath EXAM: CHEST  2 VIEW COMPARISON:  10/12/2010 radiograph FINDINGS: Approximate 3.4 cm right upper lobe lung mass, possibly with cavitation on the lateral view. No pleural effusion. Normal heart size. Aortic atherosclerosis. No pneumothorax. Partially visualized distal aortic stent. IMPRESSION: 1. Approximate 3.4 cm right upper lobe lung mass, possibly cavitary. Recommend CT chest for further evaluation. Electronically Signed   By: Donavan Foil M.D.   On: 07/12/2017 14:06   Ct Chest W Contrast  Addendum Date: 07/13/2017   ADDENDUM REPORT: 07/13/2017 07:56 ADDENDUM: I just spoke with Dr. Tessa Lerner by telephone and was asked to review this exam for the presence of pulmonary embolus. I reviewed the images with focused attention on the pulmonary arteries. There is no filling defect in the main pulmonary outflow tract, either main  pulmonary artery, or lobar pulmonary arteries to either lung. Pulmonary arterial opacification is insufficient to exclude distal segmental and subsegmental embolus. Electronically Signed   By: Misty Stanley M.D.   On: 07/13/2017 07:56   Result Date: 07/13/2017 CLINICAL DATA:  Right upper lobe lung mass on chest x-ray earlier today. Patient presented to the emergency department with productive cough, fatigue and shortness of breath. EXAM: CT CHEST WITH CONTRAST TECHNIQUE: Multidetector CT imaging of the chest was performed during intravenous contrast administration. CONTRAST:  44mL ISOVUE-300 IOPAMIDOL INJECTION 61% IV. COMPARISON:  No prior CT.  Chest x-ray earlier today and 10/12/2010. FINDINGS: Cardiovascular: Normal heart size. Mild three-vessel coronary atherosclerosis. No pericardial effusion. Severe atherosclerosis involving the thoracic and upper abdominal aorta. The extreme proximal portion of the abdominal aortic stent graft is visible and is patent. Mediastinum/Nodes: Normal sized mediastinal and hilar lymph nodes, the largest a right paratracheal node (station 4R) measuring approximately 1.6 x 1.0 cm. Gas-filled esophagus. No evidence of hiatal hernia. Thyroid gland unremarkable. Lungs/Pleura: Spiculated mass involving the right upper lobe with maximum measurements approximating 3.5 x 3.0 x 3.5 cm, cavitary with gas and fluid centrally. Approximate 0.4 x 0.3 cm nodule involving the superior segment right lower lobe (series 5, image 63). No nodules or masses elsewhere in either lung. Emphysematous changes throughout both lungs. Scar/bronchiectasis involving the lingula. No pleural effusions. Upper Abdomen: Diffuse hepatic steatosis without focal parenchymal abnormality involving the visualized liver. Visualized upper abdomen otherwise unremarkable. Musculoskeletal: Degenerative disc disease and spondylosis involving the thoracic spine in the visualized lower cervical spine. No acute osseous abnormality.  No evidence of osseous metastatic disease. IMPRESSION: 1. Approximate 3.5 cm cavitary mass involving the right upper lobe, most likely indicating a primary bronchogenic carcinoma. 2. Indeterminate 4 mm nodule involving the superior segment right lower lobe. Attention to this nodule on subsequent imaging is recommended. 3. No evidence of metastatic disease otherwise involving the chest  or upper abdomen. 4. Diffuse hepatic steatosis without focal parenchymal abnormality involving the visualized liver. Aortic Atherosclerosis (ICD10-I70.0) and Emphysema (ICD10-J43.9). Electronically Signed: By: Evangeline Dakin M.D. On: 07/12/2017 16:31   Ct Angio Chest Pe W Or Wo Contrast  Result Date: 07/13/2017 CLINICAL DATA:  Shortness of breath, evaluate for PE. Cavitary right upper lobe mass. EXAM: CT ANGIOGRAPHY CHEST WITH CONTRAST TECHNIQUE: Multidetector CT imaging of the chest was performed using the standard protocol during bolus administration of intravenous contrast. Multiplanar CT image reconstructions and MIPs were obtained to evaluate the vascular anatomy. CONTRAST:  36mL ISOVUE-370 IOPAMIDOL (ISOVUE-370) INJECTION 76% COMPARISON:  CT chest dated 07/12/2017. FINDINGS: Cardiovascular: Satisfactory opacification of the bilateral pulmonary artery to the segmental level. No evidence of pulmonary embolism. No evidence of thoracic aortic aneurysm. Atherosclerotic calcifications of the aortic arch. The heart is normal in size.  No pericardial effusion. Otherwise, unchanged from recent CT yesterday. Mediastinum/Nodes: Small mediastinal lymph nodes measuring up to 7 mm short axis. No suspicious hilar or axillary lymphadenopathy. Visualized thyroid is unremarkable. Lungs/Pleura: 3.4 x 3.3 cm partially cavitary mass in the posterior right upper lobe (series 10/image 36), suspicious for primary bronchogenic neoplasm. Underlying mild centrilobular emphysematous changes. 4 mm satellite nodule in the superior segment right lower  lobe (series 10/image 61). Additional mild nodularity anteriorly in the left upper lobe (series 10/image 69), possibly infectious. No pleural effusion or pneumothorax. Upper Abdomen: Visualized upper abdomen is notable for an incompletely visualized abdominal aortic stent. Musculoskeletal: Mild degenerative changes of the visualized thoracolumbar spine. Review of the MIP images confirms the above findings. IMPRESSION: No evidence of pulmonary embolism. Otherwise unchanged from recent CT chest. 3.4 cm partially cavitary mass in the posterior right upper lobe, suspicious for primary bronchogenic neoplasm. Aortic Atherosclerosis (ICD10-I70.0) and Emphysema (ICD10-J43.9). Electronically Signed   By: Julian Hy M.D.   On: 07/13/2017 10:53   Ct Biopsy  Result Date: 07/15/2017 INDICATION: Right upper lobe lung mass EXAM: CT BIOPSY MEDICATIONS: None. ANESTHESIA/SEDATION: Fentanyl 50 mcg IV; Versed 1 mg IV Moderate Sedation Time:  11 minutes The patient was continuously monitored during the procedure by the interventional radiology nurse under my direct supervision. FLUOROSCOPY TIME:  Fluoroscopy Time:  minutes  seconds ( mGy). COMPLICATIONS: None immediate. PROCEDURE: Informed written consent was obtained from the patient after a thorough discussion of the procedural risks, benefits and alternatives. All questions were addressed. Maximal Sterile Barrier Technique was utilized including caps, mask, sterile gowns, sterile gloves, sterile drape, hand hygiene and skin antiseptic. A timeout was performed prior to the initiation of the procedure. Under CT guidance, a 17 gauge needle was inserted into the left upper lobe lung mass. Three cores were obtained. Two were placed in formalin. One was placed in saline for tissue culture and AFB analysis. Biosintry plug was deployed and the guide needle was removed. Post biopsy images demonstrate no pneumothorax. Patient tolerated the procedure well without complication. Vital  sign monitoring by nursing staff during the procedure will continue as patient is in the special procedures unit for post procedure observation. FINDINGS: The images document guide needle placement within the right upper lobe lung mass. Post biopsy images demonstrate no hemorrhage. IMPRESSION: Successful CT-guided right upper lobe lung mass core biopsy. Electronically Signed   By: Marybelle Killings M.D.   On: 07/15/2017 11:26   Dg Chest Port 1 View  Result Date: 07/15/2017 CLINICAL DATA:  Status post lung biopsy EXAM: PORTABLE CHEST 1 VIEW COMPARISON:  Chest x-ray of July 12, 2017 FINDINGS: The  soft tissue mass in the posterior aspect of the right upper lobe is again demonstrated. There is no post biopsy pneumothorax or hemothorax. The left lung is well-expanded and clear. The heart and pulmonary vascularity are normal. The mediastinum is normal in width. There is mild multilevel degenerative disc disease of the thoracic spine. IMPRESSION: No postprocedure complication following percutaneous biopsy of the right upper lobe mass. Electronically Signed   By: David  Martinique M.D.   On: 07/15/2017 10:01    ASSESSMENT & PLAN:    72 yo with   1) Newly diagnosed right upper lobe squamous cell carcinoma of the lung. Presenting with a cavitary mass.  Biopsy proven to be squamous cell carcinoma. No overt mediastinal or hilar lymphadenopathy noted. Could be early stage though staging workup pending. Indeterminate RLL nodule 29mm.  2) Dyspnea and rest and on minimal exertion. CT chest with no PE Likely significant COPD at baseline - undiagnosed. Currently needing 2-3 L of oxygen by nasal cannula.  Has not been on home oxygen for but notes that she might have needed it.    3) Poor medical f/u.  4) Protein calorie malnutrition  5) Heavy smoker 75-100 pk-yrs PLAN -I discussed the patient's available imaging findings and pathology results showing new diagnosis of squamous cell carcinoma of the lung. -We  discussed that she would need CT abdomen and pelvis and a MRI of the brain to complete her staging workup. -Pulmonary function testing to determine baseline pulmonary function to help with determining best treatment modality surgery versus RT if this is noted to be localized squamous cell carcinoma.  Based on the patient's oxygen requirement symptoms and her strong smoking history she likely has significant decline in her pulmonary functions and would likely be a poor surgical candidate. -Would get an echocardiogram to evaluate other etiology for significant dyspnea. -Since the patient does not have a primary care physician and poor medical follow-up will try to get as much of the staging workup as possible as inpatient. She remains at high risk for non compliance with medical followup. -Discussed smoking cessation-in details -will need to setup PCP ASAP  -will need radiation oncology consultation IP vs OP. -I will inform our scheduler to help setup patient for outpatient medical oncology followup.   All of the patients questions were answered with apparent satisfaction. The patient knows to call the clinic with any problems, questions or concerns.  I spent 60 minutes counseling the patient face to face. The total time spent in the appointment was 80 minutes and more than 50% was on counseling and direct patient cares and co-ordination of her care    Sullivan Lone MD [place] AAHIVMS [facility] Hematology/Oncology Physician [facility]  (Office):       [phone] (Work cell):  [phone] (Fax):           [phone]  07/16/2017 7:19 PM

## 2017-07-17 ENCOUNTER — Inpatient Hospital Stay (HOSPITAL_COMMUNITY): Payer: PPO

## 2017-07-17 DIAGNOSIS — R0602 Shortness of breath: Secondary | ICD-10-CM

## 2017-07-17 DIAGNOSIS — I420 Dilated cardiomyopathy: Secondary | ICD-10-CM | POA: Insufficient documentation

## 2017-07-17 DIAGNOSIS — C3400 Malignant neoplasm of unspecified main bronchus: Secondary | ICD-10-CM

## 2017-07-17 DIAGNOSIS — E46 Unspecified protein-calorie malnutrition: Secondary | ICD-10-CM

## 2017-07-17 DIAGNOSIS — F17200 Nicotine dependence, unspecified, uncomplicated: Secondary | ICD-10-CM

## 2017-07-17 DIAGNOSIS — I361 Nonrheumatic tricuspid (valve) insufficiency: Secondary | ICD-10-CM

## 2017-07-17 HISTORY — PX: TRANSTHORACIC ECHOCARDIOGRAM: SHX275

## 2017-07-17 HISTORY — DX: Dilated cardiomyopathy: I42.0

## 2017-07-17 LAB — BASIC METABOLIC PANEL
Anion gap: 9 (ref 5–15)
BUN: 13 mg/dL (ref 6–20)
CHLORIDE: 90 mmol/L — AB (ref 101–111)
CO2: 35 mmol/L — ABNORMAL HIGH (ref 22–32)
CREATININE: 0.94 mg/dL (ref 0.44–1.00)
Calcium: 9.5 mg/dL (ref 8.9–10.3)
GFR calc Af Amer: >60 mL/min (ref >60)
GFR, EST NON AFRICAN AMERICAN: 60 mL/min — AB (ref >60)
GLUCOSE: 118 mg/dL — AB (ref 65–99)
POTASSIUM: 4.2 mmol/L (ref 3.5–5.1)
SODIUM: 134 mmol/L — AB (ref 135–145)

## 2017-07-17 LAB — ECHOCARDIOGRAM COMPLETE
Height: 61 in
WEIGHTICAEL: 1873.03 [oz_av]

## 2017-07-17 MED ORDER — IOPAMIDOL (ISOVUE-300) INJECTION 61%
INTRAVENOUS | Status: AC
  Administered 2017-07-17: 100 mL
  Filled 2017-07-17: qty 100

## 2017-07-17 MED ORDER — IOPAMIDOL (ISOVUE-300) INJECTION 61%
INTRAVENOUS | Status: AC
  Administered 2017-07-17: 30 mL
  Filled 2017-07-17: qty 30

## 2017-07-17 MED ORDER — IOPAMIDOL (ISOVUE-300) INJECTION 61%
15.0000 mL | Freq: Once | INTRAVENOUS | Status: DC | PRN
Start: 2017-07-17 — End: 2017-07-18

## 2017-07-17 MED ORDER — POLYVINYL ALCOHOL 1.4 % OP SOLN
1.0000 [drp] | OPHTHALMIC | Status: DC | PRN
  Administered 2017-07-17 – 2017-07-18 (×2): 1 [drp] via OPHTHALMIC
  Filled 2017-07-17: qty 15

## 2017-07-17 NOTE — Progress Notes (Signed)
Left a message for pulmonary to schedule PFT. PFT's are a Monday-Friday procedure. Notified and confirmed this information with [facility] with pulmonology.

## 2017-07-17 NOTE — Progress Notes (Signed)
  Echocardiogram 2D Echocardiogram has been performed.  Chelsea Baker 07/17/2017, 11:02 AM

## 2017-07-17 NOTE — Progress Notes (Signed)
PROGRESS NOTE        PATIENT DETAILS Name: Chelsea Baker Age: 72 y.o. Sex: female Date of Birth: 07-08-1945 Admit Date: 07/12/2017 Admitting Physician Jani Gravel, MD [id number], Sunny Schlein, DO  Brief Narrative: Patient is a 72 y.o. female history of hypertension, nicotine dependency-presented with a 2-week history of cough, weight loss and was found to have a 3.4 cm cavitary mass in the posterior right upper lobe-highly suspicious for a primary bronchogenic neoplasm.  Seen by PCCM and and interventional radiology in consult, subsequently underwent CT-guided biopsy on 1/17.  See below for further details  Subjective:  Patient in bed, appears comfortable, denies any headache, no fever, no chest pain or pressure, no shortness of breath , no abdominal pain. No focal weakness.  Assessment/Plan:  Right upper lobe cavitary lesion: Biopsy on 1/17 confirmed squamous cell carcinoma.  Initially there was some concern for TB-hence placed on isolation, AFB smear x1 is negative.  Gold QuantiFERON is negative as well.  By oncology per oncology she has to undergo MRI brain, CT abdomen pelvis, echocardiogram and PFTs before discharge, all ordered, encourage activity, supportive care until then.  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   Hypertension: Controlled with metoprolol  Dyslipidemia: Continue statin     DVT Prophylaxis: Prophylactic Lovenox  Code Status: Full code   Family Communication: None at bedside  Disposition Plan: Remain inpatient-home soon after oncology evaluation   Antimicrobial agents: Anti-infectives (From admission, onward)   None     Procedures: None  CONSULTS:  pulmonary/intensive care, Oncology  Time spent: 25- minutes-Greater than 50% of this time was spent in counseling, explanation of diagnosis, planning of further management, and coordination of care.  MEDICATIONS: Scheduled Meds: .  enoxaparin (LOVENOX) injection  40 mg Subcutaneous QHS  . feeding supplement (ENSURE ENLIVE)  237 mL Oral TID BM  . mouth rinse  15 mL Mouth Rinse BID  . metoprolol tartrate  50 mg Oral BID  . simvastatin  40 mg Oral QHS   Continuous Infusions: PRN Meds:.acetaminophen **OR** acetaminophen, diphenhydrAMINE, ondansetron (ZOFRAN) IV, oxyCODONE-acetaminophen   PHYSICAL EXAM: Vital signs: Vitals:   07/16/17 0554 07/16/17 1300 07/16/17 2122 07/17/17 0526  BP: (!) 166/85 (!) 131/115 (!) 159/77 (!) 165/92  Pulse: 80 71 82 78  Resp: 18 18 18 19   Temp: 98.7 F (37.1 C) 97.8 F (36.6 C) 97.9 F (36.6 C) 98.7 F (37.1 C)  TempSrc: Oral Oral Oral Oral  SpO2: 92% 93% 93% 94%  Weight: 53.3 kg (117 lb 8.1 oz)   53.1 kg (117 lb 1 oz)  Height:       Filed Weights   07/15/17 0613 07/16/17 0554 07/17/17 0526  Weight: 53.5 kg (118 lb) 53.3 kg (117 lb 8.1 oz) 53.1 kg (117 lb 1 oz)   Body mass index is 22.12 kg/m.   Exam  Awake Alert, Oriented X 3, No new F.N deficits, Normal affect [place].AT,PERRAL Supple Neck,No JVD, No cervical lymphadenopathy appriciated.  Symmetrical Chest wall movement, Good air movement bilaterally, CTAB RRR,No Gallops, Rubs or new Murmurs, No Parasternal Heave +ve B.Sounds, Abd Soft, No tenderness, No organomegaly appriciated, No rebound - guarding or rigidity. No Cyanosis, Clubbing or edema, No new Rash or bruise  I have personally reviewed following labs and imaging studies  LABORATORY DATA: CBC: Recent Labs  Lab 07/12/17 1434 07/13/17  5170 07/15/17 0446  WBC 8.5 9.4 8.2  NEUTROABS 6.3  --  6.3  HGB 13.9 12.9 11.6*  HCT 43.6 40.8 38.1  MCV 84.8 85.4 86.8  PLT 399 372 017    Basic Metabolic Panel: Recent Labs  Lab 07/13/17 0237 07/14/17 0429 07/15/17 0446 07/16/17 0433 07/17/17 0447  NA 132* 132* 130* 132* 134*  K 3.8 4.7 4.3 4.4 4.2  CL 94* 93* 91* 90* 90*  CO2 29 32 32 34* 35*  GLUCOSE 97 108* 130* 122* 118*  BUN 12 9 15 14 13   CREATININE  0.81 0.77 1.06* 0.95 0.94  CALCIUM 9.2 9.6 9.3 9.3 9.5    GFR: Estimated Creatinine Clearance: 41.4 mL/min (by C-G formula based on SCr of 0.94 mg/dL).  Liver Function Tests: Recent Labs  Lab 07/13/17 0237  AST 20  ALT 12*  ALKPHOS 72  BILITOT 0.7  PROT 6.7  ALBUMIN 3.6   No results for input(s): LIPASE, AMYLASE in the last 168 hours. No results for input(s): AMMONIA in the last 168 hours.  Coagulation Profile: Recent Labs  Lab 07/15/17 0446  INR 0.90    Cardiac Enzymes: Recent Labs  Lab 07/12/17 2054 07/13/17 0237 07/13/17 0856  TROPONINI <0.03 <0.03 <0.03    BNP (last 3 results) No results for input(s): PROBNP in the last 8760 hours.  HbA1C: No results for input(s): HGBA1C in the last 72 hours.  CBG: Recent Labs  Lab 07/14/17 0846 07/14/17 1210 07/14/17 1647  GLUCAP 90 113* 126*    Lipid Profile: No results for input(s): CHOL, HDL, LDLCALC, TRIG, CHOLHDL, LDLDIRECT in the last 72 hours.  Thyroid Function Tests: No results for input(s): TSH, T4TOTAL, FREET4, T3FREE, THYROIDAB in the last 72 hours.  Anemia Panel: No results for input(s): VITAMINB12, FOLATE, FERRITIN, TIBC, IRON, RETICCTPCT in the last 72 hours.  Urine analysis:    Component Value Date/Time   COLORURINE YELLOW 07/12/2017 [place] 07/12/2017 1304   LABSPEC 1.013 07/12/2017 1304   PHURINE 5.0 07/12/2017 1304   GLUCOSEU NEGATIVE 07/12/2017 1304   HGBUR NEGATIVE 07/12/2017 1304   BILIRUBINUR NEGATIVE 07/12/2017 1304   KETONESUR 5 (A) 07/12/2017 1304   PROTEINUR 30 (A) 07/12/2017 1304   UROBILINOGEN 0.2 09/25/2010 0357   NITRITE NEGATIVE 07/12/2017 1304   LEUKOCYTESUR TRACE (A) 07/12/2017 1304    Sepsis Labs: Lactic Acid, Venous    Component Value Date/Time   LATICACIDVEN 1.1 09/27/2010 2001    MICROBIOLOGY: Recent Results (from the past 240 hour(s))  Acid Fast Smear (AFB)     Status: None   Collection Time: 07/14/17  5:00 AM  Result Value Ref Range  Status   AFB Specimen Processing Concentration  Final   Acid Fast Smear Negative  Final    Comment: (NOTE) Performed At: [facility] [place], [place] [id number] Rush Farmer MD [id number]    Source (AFB) SPUTUM  Final  Acid Fast Smear (AFB)     Status: None   Collection Time: 07/15/17  9:00 AM  Result Value Ref Range Status   AFB Specimen Processing Concentration  Final   Acid Fast Smear Negative  Final    Comment: (NOTE) Performed At: [facility] [place], [place] [id number] Rush Farmer MD [id number]    Source (AFB) LUNG  Final    Comment: RUL    RADIOLOGY STUDIES/RESULTS: Dg Chest 2 View  Result Date: 07/12/2017 CLINICAL DATA:  Productive cough, fatigue and short of breath EXAM: CHEST  2 VIEW COMPARISON:  10/12/2010 radiograph FINDINGS: Approximate 3.4 cm right upper lobe lung mass, possibly with cavitation on the lateral view. No pleural effusion. Normal heart size. Aortic atherosclerosis. No pneumothorax. Partially visualized distal aortic stent. IMPRESSION: 1. Approximate 3.4 cm right upper lobe lung mass, possibly cavitary. Recommend CT chest for further evaluation. Electronically Signed   By: Donavan Foil M.D.   On: 07/12/2017 14:06   Ct Chest W Contrast  Addendum Date: 07/13/2017   ADDENDUM REPORT: 07/13/2017 07:56 ADDENDUM: I just spoke with Dr. Tessa Lerner by telephone and was asked to review this exam for the presence of pulmonary embolus. I reviewed the images with focused attention on the pulmonary arteries. There is no filling defect in the main pulmonary outflow tract, either main pulmonary artery, or lobar pulmonary arteries to either lung. Pulmonary arterial opacification is insufficient to exclude distal segmental and subsegmental embolus. Electronically Signed   By: Misty Stanley M.D.   On: 07/13/2017 07:56   Result Date: 07/13/2017 CLINICAL DATA:  Right upper lobe lung mass on chest x-ray earlier today.  Patient presented to the emergency department with productive cough, fatigue and shortness of breath. EXAM: CT CHEST WITH CONTRAST TECHNIQUE: Multidetector CT imaging of the chest was performed during intravenous contrast administration. CONTRAST:  43mL ISOVUE-300 IOPAMIDOL INJECTION 61% IV. COMPARISON:  No prior CT.  Chest x-ray earlier today and 10/12/2010. FINDINGS: Cardiovascular: Normal heart size. Mild three-vessel coronary atherosclerosis. No pericardial effusion. Severe atherosclerosis involving the thoracic and upper abdominal aorta. The extreme proximal portion of the abdominal aortic stent graft is visible and is patent. Mediastinum/Nodes: Normal sized mediastinal and hilar lymph nodes, the largest a right paratracheal node (station 4R) measuring approximately 1.6 x 1.0 cm. Gas-filled esophagus. No evidence of hiatal hernia. Thyroid gland unremarkable. Lungs/Pleura: Spiculated mass involving the right upper lobe with maximum measurements approximating 3.5 x 3.0 x 3.5 cm, cavitary with gas and fluid centrally. Approximate 0.4 x 0.3 cm nodule involving the superior segment right lower lobe (series 5, image 63). No nodules or masses elsewhere in either lung. Emphysematous changes throughout both lungs. Scar/bronchiectasis involving the lingula. No pleural effusions. Upper Abdomen: Diffuse hepatic steatosis without focal parenchymal abnormality involving the visualized liver. Visualized upper abdomen otherwise unremarkable. Musculoskeletal: Degenerative disc disease and spondylosis involving the thoracic spine in the visualized lower cervical spine. No acute osseous abnormality. No evidence of osseous metastatic disease. IMPRESSION: 1. Approximate 3.5 cm cavitary mass involving the right upper lobe, most likely indicating a primary bronchogenic carcinoma. 2. Indeterminate 4 mm nodule involving the superior segment right lower lobe. Attention to this nodule on subsequent imaging is recommended. 3. No evidence  of metastatic disease otherwise involving the chest or upper abdomen. 4. Diffuse hepatic steatosis without focal parenchymal abnormality involving the visualized liver. Aortic Atherosclerosis (ICD10-I70.0) and Emphysema (ICD10-J43.9). Electronically Signed: By: Evangeline Dakin M.D. On: 07/12/2017 16:31   Ct Angio Chest Pe W Or Wo Contrast  Result Date: 07/13/2017 CLINICAL DATA:  Shortness of breath, evaluate for PE. Cavitary right upper lobe mass. EXAM: CT ANGIOGRAPHY CHEST WITH CONTRAST TECHNIQUE: Multidetector CT imaging of the chest was performed using the standard protocol during bolus administration of intravenous contrast. Multiplanar CT image reconstructions and MIPs were obtained to evaluate the vascular anatomy. CONTRAST:  68mL ISOVUE-370 IOPAMIDOL (ISOVUE-370) INJECTION 76% COMPARISON:  CT chest dated 07/12/2017. FINDINGS: Cardiovascular: Satisfactory opacification of the bilateral pulmonary artery to the segmental level. No evidence of pulmonary embolism. No evidence of thoracic aortic aneurysm. Atherosclerotic calcifications of the  aortic arch. The heart is normal in size.  No pericardial effusion. Otherwise, unchanged from recent CT yesterday. Mediastinum/Nodes: Small mediastinal lymph nodes measuring up to 7 mm short axis. No suspicious hilar or axillary lymphadenopathy. Visualized thyroid is unremarkable. Lungs/Pleura: 3.4 x 3.3 cm partially cavitary mass in the posterior right upper lobe (series 10/image 36), suspicious for primary bronchogenic neoplasm. Underlying mild centrilobular emphysematous changes. 4 mm satellite nodule in the superior segment right lower lobe (series 10/image 61). Additional mild nodularity anteriorly in the left upper lobe (series 10/image 69), possibly infectious. No pleural effusion or pneumothorax. Upper Abdomen: Visualized upper abdomen is notable for an incompletely visualized abdominal aortic stent. Musculoskeletal: Mild degenerative changes of the visualized  thoracolumbar spine. Review of the MIP images confirms the above findings. IMPRESSION: No evidence of pulmonary embolism. Otherwise unchanged from recent CT chest. 3.4 cm partially cavitary mass in the posterior right upper lobe, suspicious for primary bronchogenic neoplasm. Aortic Atherosclerosis (ICD10-I70.0) and Emphysema (ICD10-J43.9). Electronically Signed   By: Julian Hy M.D.   On: 07/13/2017 10:53   Ct Biopsy  Result Date: 07/15/2017 INDICATION: Right upper lobe lung mass EXAM: CT BIOPSY MEDICATIONS: None. ANESTHESIA/SEDATION: Fentanyl 50 mcg IV; Versed 1 mg IV Moderate Sedation Time:  11 minutes The patient was continuously monitored during the procedure by the interventional radiology nurse under my direct supervision. FLUOROSCOPY TIME:  Fluoroscopy Time:  minutes  seconds ( mGy). COMPLICATIONS: None immediate. PROCEDURE: Informed written consent was obtained from the patient after a thorough discussion of the procedural risks, benefits and alternatives. All questions were addressed. Maximal Sterile Barrier Technique was utilized including caps, mask, sterile gowns, sterile gloves, sterile drape, hand hygiene and skin antiseptic. A timeout was performed prior to the initiation of the procedure. Under CT guidance, a 17 gauge needle was inserted into the left upper lobe lung mass. Three cores were obtained. Two were placed in formalin. One was placed in saline for tissue culture and AFB analysis. Biosintry plug was deployed and the guide needle was removed. Post biopsy images demonstrate no pneumothorax. Patient tolerated the procedure well without complication. Vital sign monitoring by nursing staff during the procedure will continue as patient is in the special procedures unit for post procedure observation. FINDINGS: The images document guide needle placement within the right upper lobe lung mass. Post biopsy images demonstrate no hemorrhage. IMPRESSION: Successful CT-guided right upper lobe  lung mass core biopsy. Electronically Signed   By: Marybelle Killings M.D.   On: 07/15/2017 11:26   Dg Chest Port 1 View  Result Date: 07/15/2017 CLINICAL DATA:  Status post lung biopsy EXAM: PORTABLE CHEST 1 VIEW COMPARISON:  Chest x-ray of July 12, 2017 FINDINGS: The soft tissue mass in the posterior aspect of the right upper lobe is again demonstrated. There is no post biopsy pneumothorax or hemothorax. The left lung is well-expanded and clear. The heart and pulmonary vascularity are normal. The mediastinum is normal in width. There is mild multilevel degenerative disc disease of the thoracic spine. IMPRESSION: No postprocedure complication following percutaneous biopsy of the right upper lobe mass. Electronically Signed   By: David  Martinique M.D.   On: 07/15/2017 10:01     LOS: 5 days   Signature  Lala Lund M.D on 07/17/2017 at 10:30 AM  Between 7am to 7pm - Pager - [phone] ( page via [email or web address], text pages only, please mention full 10 digit call back number).  After 7pm go to [email or web address] - password [facility]

## 2017-07-18 DIAGNOSIS — R0902 Hypoxemia: Secondary | ICD-10-CM

## 2017-07-18 DIAGNOSIS — E871 Hypo-osmolality and hyponatremia: Secondary | ICD-10-CM

## 2017-07-18 DIAGNOSIS — C3401 Malignant neoplasm of right main bronchus: Secondary | ICD-10-CM

## 2017-07-18 LAB — BASIC METABOLIC PANEL
ANION GAP: 8 (ref 5–15)
BUN: 13 mg/dL (ref 6–20)
CHLORIDE: 91 mmol/L — AB (ref 101–111)
CO2: 34 mmol/L — ABNORMAL HIGH (ref 22–32)
CREATININE: 0.87 mg/dL (ref 0.44–1.00)
Calcium: 9.5 mg/dL (ref 8.9–10.3)
GFR calc non Af Amer: >60 mL/min (ref >60)
Glucose, Bld: 107 mg/dL — ABNORMAL HIGH (ref 65–99)
POTASSIUM: 4 mmol/L (ref 3.5–5.1)
SODIUM: 133 mmol/L — AB (ref 135–145)

## 2017-07-18 MED ORDER — SIMVASTATIN 40 MG PO TABS: 40.0000 mg | ORAL_TABLET | Freq: Every day | ORAL | 0 refills | Status: DC

## 2017-07-18 MED ORDER — LISINOPRIL 5 MG PO TABS: 5.0000 mg | ORAL_TABLET | Freq: Every day | ORAL | 11 refills | Status: DC

## 2017-07-18 MED ORDER — CARVEDILOL 3.125 MG PO TABS: 3.1250 mg | ORAL_TABLET | Freq: Two times a day (BID) | ORAL | 11 refills | Status: DC

## 2017-07-18 MED ORDER — METOPROLOL TARTRATE 25 MG PO TABS: 25.0000 mg | ORAL_TABLET | Freq: Two times a day (BID) | ORAL | 0 refills | Status: DC

## 2017-07-18 MED ORDER — IPRATROPIUM-ALBUTEROL 0.5-2.5 (3) MG/3ML IN SOLN: 3.0000 mL | RESPIRATORY_TRACT | 0 refills | Status: DC | PRN

## 2017-07-18 NOTE — Progress Notes (Signed)
D/c ing to home w/ family.All d/c instructions given w/ verbal understanding.Awaiting ride and home O2.

## 2017-07-18 NOTE — Progress Notes (Signed)
SATURATION QUALIFICATIONS: (This note is used to comply with regulatory documentation for home oxygen)  Patient Saturations on Room Air at Rest = 88%  Patient Saturations on Room Air while Ambulating = 87%

## 2017-07-18 NOTE — Progress Notes (Signed)
Ride here d/c via w/c to home

## 2017-07-18 NOTE — Discharge Instructions (Signed)
Follow with Primary MD Karma Greaser, DO in 7 days   Get CBC, CMP, 2 view Chest X ray checked  by Primary MD   in 5-7 days   Activity: As tolerated with Full fall precautions use walker/cane & assistance as needed  Disposition Home    Diet: Heart Healthy    For Heart failure patients - Check your Weight same time everyday, if you gain over 2 pounds, or you develop in leg swelling, experience more shortness of breath or chest pain, call your Primary MD immediately. Follow Cardiac Low Salt Diet and 1.5 lit/day fluid restriction.  On your next visit with your primary care physician please Get Medicines reviewed and adjusted.  Please request your Prim.MD to go over all Hospital Tests and Procedure/Radiological results at the follow up, please get all Hospital records sent to your Prim MD by signing hospital release before you go home.  If you experience worsening of your admission symptoms, develop shortness of breath, life threatening emergency, suicidal or homicidal thoughts you must seek medical attention immediately by calling 911 or calling your MD immediately  if symptoms less severe.  You Must read complete instructions/literature along with all the possible adverse reactions/side effects for all the Medicines you take and that have been prescribed to you. Take any new Medicines after you have completely understood and accpet all the possible adverse reactions/side effects.   Do not drive, operate heavy machinery, perform activities at heights, swimming or participation in water activities or provide baby sitting services if your were admitted for syncope or siezures until you have seen by Primary MD or a Neurologist and advised to do so again.  Do not drive when taking Pain medications.    Do not take more than prescribed Pain, Sleep and Anxiety Medications  Special Instructions: If you have smoked or chewed Tobacco  in the last 2 yrs please stop smoking, stop any regular Alcohol   and or any Recreational drug use.

## 2017-07-18 NOTE — Discharge Summary (Addendum)
Chelsea Baker FIE:[id number] DOB: [birth year] DOA: 07/12/2017  PCP: Karma Greaser, DO  Admit date: 07/12/2017  Discharge date: 07/18/2017  Admitted From: Home  Disposition:  Home   Recommendations for Outpatient Follow-up:   Follow up with PCP in 1-2 weeks  PCP Please obtain BMP/CBC, 2 view CXR in 1week,  (see Discharge instructions)   PCP Please follow up on the following pending results: None   Home Health: RN   Equipment/Devices: o2  Consultations: pulmonary/intensive care, Oncology Discharge Condition: Fair  CODE STATUS: Full  Diet Recommendation:  Heart Healthy   Chief Complaint  Patient presents with  . sent from Urgent care     Brief history of present illness from the day of admission and additional interim summary    Patient is a 72 y.o. female history of hypertension, nicotine dependency-presented with a 2-week history of cough, weight loss and was found to have a 3.4 cm cavitary mass in the posterior right upper lobe-highly suspicious for a primary bronchogenic neoplasm.  Seen by PCCM and and interventional radiology in consult, subsequently underwent CT-guided biopsy on 1/17.  See below for further details                                                                  Hospital Course    Right upper lobe cavitary lesion: Biopsy on 1/17 confirmed squamous cell carcinoma.  Initially there was some concern for TB-hence placed on isolation, AFB smear x1 is negative.  Gold QuantiFERON is negative as well.    She was seen by oncology here, so far she has had CT abdomen pelvis which is unremarkable, echogram as below any acute changes, MRI brain cannot be done due to the weekend and she needs outpatient MRI along with PFTs to complete her workup.  At this point she is symptom-free will be  discharged home with home RN with outpatient PCP, oncology and pulmonary follow-up, she will get 2 L nasal cannula home oxygen along with nebulizer treatments as needed.  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   Hypertension: Controlled with metoprolol  Dyslipidemia: Continue statin  Chronic combined systolic and diastolic CHF EF 16%.  She is compensated, no signs of decompensation of fluid overload, will be placed on beta-blocker, low-dose ACE inhibitor along with home dose statin and fluid restriction with outpatient PCP follow-up.    Discharge diagnosis     Principal Problem:   Lung cancer ([place]) Active Problems:   Tachycardia   Hyponatremia   Cavitating mass in right upper lung lobe   Shortness of breath   Current every day smoker   Protein-calorie malnutrition ([place])   Hypoxia    Discharge instructions    Discharge Instructions    Diet - low sodium heart healthy  Complete by:  As directed    Discharge instructions   Complete by:  As directed    Follow with Primary MD Karma Greaser, DO in 7 days   Get CBC, CMP, 2 view Chest X ray checked  by Primary MD   in 5-7 days   Activity: As tolerated with Full fall precautions use walker/cane & assistance as needed  Disposition Home    Diet: Heart Healthy    For Heart failure patients - Check your Weight same time everyday, if you gain over 2 pounds, or you develop in leg swelling, experience more shortness of breath or chest pain, call your Primary MD immediately. Follow Cardiac Low Salt Diet and 1.5 lit/day fluid restriction.  On your next visit with your primary care physician please Get Medicines reviewed and adjusted.  Please request your Prim.MD to go over all Hospital Tests and Procedure/Radiological results at the follow up, please get all Hospital records sent to your Prim MD by signing hospital release before you go home.  If you experience worsening of your admission  symptoms, develop shortness of breath, life threatening emergency, suicidal or homicidal thoughts you must seek medical attention immediately by calling 911 or calling your MD immediately  if symptoms less severe.  You Must read complete instructions/literature along with all the possible adverse reactions/side effects for all the Medicines you take and that have been prescribed to you. Take any new Medicines after you have completely understood and accpet all the possible adverse reactions/side effects.   Do not drive, operate heavy machinery, perform activities at heights, swimming or participation in water activities or provide baby sitting services if your were admitted for syncope or siezures until you have seen by Primary MD or a Neurologist and advised to do so again.  Do not drive when taking Pain medications.    Do not take more than prescribed Pain, Sleep and Anxiety Medications  Special Instructions: If you have smoked or chewed Tobacco  in the last 2 yrs please stop smoking, stop any regular Alcohol  and or any Recreational drug use.   Increase activity slowly   Complete by:  As directed       Discharge Medications   Allergies as of 07/18/2017   No Known Allergies     Medication List    STOP taking these medications   aspirin 325 MG EC tablet   diphenhydrAMINE 25 MG tablet Commonly known as:  BENADRYL   metoprolol tartrate 25 MG tablet Commonly known as:  LOPRESSOR   oxyCODONE-acetaminophen 5-325 MG tablet Commonly known as:  PERCOCET/ROXICET     TAKE these medications   acetaminophen 500 MG tablet Commonly known as:  TYLENOL Take 500-1,000 mg by mouth every 6 (six) hours as needed for mild pain, moderate pain or headache.   carvedilol 3.125 MG tablet Commonly known as:  COREG Take 1 tablet (3.125 mg total) by mouth 2 (two) times daily.   dextromethorphan-guaiFENesin 30-600 MG 12hr tablet Commonly known as:  MUCINEX DM Take 1 tablet by mouth every 12  (twelve) hours as needed.   diphenhydrAMINE 25 MG tablet Commonly known as:  SOMINEX Take 25 mg by mouth as needed.   ipratropium-albuterol 0.5-2.5 (3) MG/3ML Soln Commonly known as:  DUONEB Take 3 mLs by nebulization every 4 (four) hours as needed.   lisinopril 5 MG tablet Commonly known as:  PRINIVIL,ZESTRIL Take 1 tablet (5 mg total) by mouth daily.   simvastatin 40 MG tablet Commonly known as:  ZOCOR Take  1 tablet (40 mg total) by mouth at bedtime.            Durable Medical Equipment  (From admission, onward)        Start     Ordered   07/18/17 1002  For home use only DME oxygen  Once    Question Answer Comment  Mode or (Route) Nasal cannula   Liters per Minute 2   Frequency Continuous (stationary and portable oxygen unit needed)   Oxygen conserving device Yes   Oxygen delivery system Gas      07/18/17 1001   07/18/17 1002  For home use only DME Nebulizer/meds  Once    Question:  Patient needs a nebulizer to treat with the following condition  Answer:  Lung cancer ([place])   07/18/17 1001      Follow-up Information    Karma Greaser, DO. Schedule an appointment as soon as possible for a visit in 2 day(s).   Specialty:  Family Medicine Contact information: [address] [place] [address] [phone]        [place]. Schedule an appointment as soon as possible for a visit in 2 day(s).   Contact information: [place] [address] [phone]       Curt Bears, MD. Schedule an appointment as soon as possible for a visit in 3 day(s).   Specialty:  Oncology Why:  New diagnosis of lung cancer Contact information: [place] [address] [phone]        Collene Gobble, MD. Schedule an appointment as soon as possible for a visit in 2 week(s).   Specialty:  Pulmonary Disease Why:  PFTs Contact information: [address] [place]  [address] [phone]        Jerline Pain, MD. Schedule an appointment as soon as possible for a visit in 1 week(s).   Specialty:  Cardiology Why:  CHF Contact information: [address] [place] [address] [place] [address] [phone]           Major procedures and Radiology Reports - PLEASE review detailed and final reports thoroughly  -     Echocardiogram  Left ventricle: Septal mid and basal inferior wall hypokinesis   The cavity size was mildly dilated. Wall thickness was normal.   Systolic function was moderately reduced. The estimated ejection   fraction was in the range of 35% to 40%. Doppler parameters are   consistent with abnormal left ventricular relaxation (grade 1   diastolic dysfunction). - Mitral valve: Calcified annulus. Moderately thickened, moderately   calcified leaflets . - Atrial septum: No defect or patent foramen ovale was identified.   Dg Chest 2 View  Result Date: 07/12/2017 CLINICAL DATA:  Productive cough, fatigue and short of breath EXAM: CHEST  2 VIEW COMPARISON:  10/12/2010 radiograph FINDINGS: Approximate 3.4 cm right upper lobe lung mass, possibly with cavitation on the lateral view. No pleural effusion. Normal heart size. Aortic atherosclerosis. No pneumothorax. Partially visualized distal aortic stent. IMPRESSION: 1. Approximate 3.4 cm right upper lobe lung mass, possibly cavitary. Recommend CT chest for further evaluation. Electronically Signed   By: Donavan Foil M.D.   On: 07/12/2017 14:06   Ct Chest W Contrast  Addendum Date: 07/13/2017   ADDENDUM REPORT: 07/13/2017 07:56 ADDENDUM: I just spoke with Dr. Tessa Lerner by telephone and was asked to review this exam for the presence of pulmonary embolus. I reviewed the images with  focused attention on the pulmonary arteries. There is no filling defect in the main pulmonary outflow tract, either main pulmonary artery, or lobar pulmonary arteries to either lung. Pulmonary arterial opacification  is insufficient to exclude distal segmental and subsegmental embolus. Electronically Signed   By: Misty Stanley M.D.   On: 07/13/2017 07:56   Result Date: 07/13/2017 CLINICAL DATA:  Right upper lobe lung mass on chest x-ray earlier today. Patient presented to the emergency department with productive cough, fatigue and shortness of breath. EXAM: CT CHEST WITH CONTRAST TECHNIQUE: Multidetector CT imaging of the chest was performed during intravenous contrast administration. CONTRAST:  25mL ISOVUE-300 IOPAMIDOL INJECTION 61% IV. COMPARISON:  No prior CT.  Chest x-ray earlier today and 10/12/2010. FINDINGS: Cardiovascular: Normal heart size. Mild three-vessel coronary atherosclerosis. No pericardial effusion. Severe atherosclerosis involving the thoracic and upper abdominal aorta. The extreme proximal portion of the abdominal aortic stent graft is visible and is patent. Mediastinum/Nodes: Normal sized mediastinal and hilar lymph nodes, the largest a right paratracheal node (station 4R) measuring approximately 1.6 x 1.0 cm. Gas-filled esophagus. No evidence of hiatal hernia. Thyroid gland unremarkable. Lungs/Pleura: Spiculated mass involving the right upper lobe with maximum measurements approximating 3.5 x 3.0 x 3.5 cm, cavitary with gas and fluid centrally. Approximate 0.4 x 0.3 cm nodule involving the superior segment right lower lobe (series 5, image 63). No nodules or masses elsewhere in either lung. Emphysematous changes throughout both lungs. Scar/bronchiectasis involving the lingula. No pleural effusions. Upper Abdomen: Diffuse hepatic steatosis without focal parenchymal abnormality involving the visualized liver. Visualized upper abdomen otherwise unremarkable. Musculoskeletal: Degenerative disc disease and spondylosis involving the thoracic spine in the visualized lower cervical spine. No acute osseous abnormality. No evidence of osseous metastatic disease. IMPRESSION: 1. Approximate 3.5 cm cavitary mass  involving the right upper lobe, most likely indicating a primary bronchogenic carcinoma. 2. Indeterminate 4 mm nodule involving the superior segment right lower lobe. Attention to this nodule on subsequent imaging is recommended. 3. No evidence of metastatic disease otherwise involving the chest or upper abdomen. 4. Diffuse hepatic steatosis without focal parenchymal abnormality involving the visualized liver. Aortic Atherosclerosis (ICD10-I70.0) and Emphysema (ICD10-J43.9). Electronically Signed: By: Evangeline Dakin M.D. On: 07/12/2017 16:31   Ct Angio Chest Pe W Or Wo Contrast  Result Date: 07/13/2017 CLINICAL DATA:  Shortness of breath, evaluate for PE. Cavitary right upper lobe mass. EXAM: CT ANGIOGRAPHY CHEST WITH CONTRAST TECHNIQUE: Multidetector CT imaging of the chest was performed using the standard protocol during bolus administration of intravenous contrast. Multiplanar CT image reconstructions and MIPs were obtained to evaluate the vascular anatomy. CONTRAST:  27mL ISOVUE-370 IOPAMIDOL (ISOVUE-370) INJECTION 76% COMPARISON:  CT chest dated 07/12/2017. FINDINGS: Cardiovascular: Satisfactory opacification of the bilateral pulmonary artery to the segmental level. No evidence of pulmonary embolism. No evidence of thoracic aortic aneurysm. Atherosclerotic calcifications of the aortic arch. The heart is normal in size.  No pericardial effusion. Otherwise, unchanged from recent CT yesterday. Mediastinum/Nodes: Small mediastinal lymph nodes measuring up to 7 mm short axis. No suspicious hilar or axillary lymphadenopathy. Visualized thyroid is unremarkable. Lungs/Pleura: 3.4 x 3.3 cm partially cavitary mass in the posterior right upper lobe (series 10/image 36), suspicious for primary bronchogenic neoplasm. Underlying mild centrilobular emphysematous changes. 4 mm satellite nodule in the superior segment right lower lobe (series 10/image 61). Additional mild nodularity anteriorly in the left upper lobe  (series 10/image 69), possibly infectious. No pleural effusion or pneumothorax. Upper Abdomen: Visualized upper abdomen is notable for an incompletely visualized  abdominal aortic stent. Musculoskeletal: Mild degenerative changes of the visualized thoracolumbar spine. Review of the MIP images confirms the above findings. IMPRESSION: No evidence of pulmonary embolism. Otherwise unchanged from recent CT chest. 3.4 cm partially cavitary mass in the posterior right upper lobe, suspicious for primary bronchogenic neoplasm. Aortic Atherosclerosis (ICD10-I70.0) and Emphysema (ICD10-J43.9). Electronically Signed   By: Julian Hy M.D.   On: 07/13/2017 10:53   Ct Abdomen Pelvis W Contrast  Result Date: 07/17/2017 CLINICAL DATA:  Lung cancer evaluation. Evaluate for metastatic disease EXAM: CT ABDOMEN AND PELVIS WITH CONTRAST TECHNIQUE: Multidetector CT imaging of the abdomen and pelvis was performed using the standard protocol following bolus administration of intravenous contrast. CONTRAST:  53mL ISOVUE-300 IOPAMIDOL (ISOVUE-300) INJECTION 61%, 170mL ISOVUE-300 IOPAMIDOL (ISOVUE-300) INJECTION 61% COMPARISON:  CT 07/13/2017 FINDINGS: Lower chest: Lung bases are clear. Hepatobiliary: No focal hepatic lesion. No biliary duct dilatation. Gallbladder is normal. Common bile duct is normal. Pancreas: Pancreas is normal. No ductal dilatation. No pancreatic inflammation. Spleen: Normal spleen Adrenals/urinary tract: Adrenal glands and kidneys are normal. The ureters and bladder normal. Stomach/Bowel: Stomach, small bowel, appendix, and cecum are normal. The colon and rectosigmoid colon are normal. Vascular/Lymphatic: Abdominal aorta is normal caliber. Aortic stent graft noted. There is no retroperitoneal or periportal lymphadenopathy. No pelvic lymphadenopathy. Reproductive: Post hysterectomy Other: No peritoneal disease Musculoskeletal: No aggressive osseous lesion. IMPRESSION: No evidence of metastatic disease in the  abdomen pelvis. Electronically Signed   By: Suzy Bouchard M.D.   On: 07/17/2017 16:14   Ct Biopsy  Result Date: 07/15/2017 INDICATION: Right upper lobe lung mass EXAM: CT BIOPSY MEDICATIONS: None. ANESTHESIA/SEDATION: Fentanyl 50 mcg IV; Versed 1 mg IV Moderate Sedation Time:  11 minutes The patient was continuously monitored during the procedure by the interventional radiology nurse under my direct supervision. FLUOROSCOPY TIME:  Fluoroscopy Time:  minutes  seconds ( mGy). COMPLICATIONS: None immediate. PROCEDURE: Informed written consent was obtained from the patient after a thorough discussion of the procedural risks, benefits and alternatives. All questions were addressed. Maximal Sterile Barrier Technique was utilized including caps, mask, sterile gowns, sterile gloves, sterile drape, hand hygiene and skin antiseptic. A timeout was performed prior to the initiation of the procedure. Under CT guidance, a 17 gauge needle was inserted into the left upper lobe lung mass. Three cores were obtained. Two were placed in formalin. One was placed in saline for tissue culture and AFB analysis. Biosintry plug was deployed and the guide needle was removed. Post biopsy images demonstrate no pneumothorax. Patient tolerated the procedure well without complication. Vital sign monitoring by nursing staff during the procedure will continue as patient is in the special procedures unit for post procedure observation. FINDINGS: The images document guide needle placement within the right upper lobe lung mass. Post biopsy images demonstrate no hemorrhage. IMPRESSION: Successful CT-guided right upper lobe lung mass core biopsy. Electronically Signed   By: Marybelle Killings M.D.   On: 07/15/2017 11:26   Dg Chest Port 1 View  Result Date: 07/15/2017 CLINICAL DATA:  Status post lung biopsy EXAM: PORTABLE CHEST 1 VIEW COMPARISON:  Chest x-ray of July 12, 2017 FINDINGS: The soft tissue mass in the posterior aspect of the right  upper lobe is again demonstrated. There is no post biopsy pneumothorax or hemothorax. The left lung is well-expanded and clear. The heart and pulmonary vascularity are normal. The mediastinum is normal in width. There is mild multilevel degenerative disc disease of the thoracic spine. IMPRESSION: No postprocedure complication following percutaneous biopsy  of the right upper lobe mass. Electronically Signed   By: David  Martinique M.D.   On: 07/15/2017 10:01    Micro Results     Recent Results (from the past 240 hour(s))  Acid Fast Smear (AFB)     Status: None   Collection Time: 07/14/17  5:00 AM  Result Value Ref Range Status   AFB Specimen Processing Concentration  Final   Acid Fast Smear Negative  Final    Comment: (NOTE) Performed At: [facility] [address] [place], [place] [id number] Rush Farmer MD EH:[id number]    Source (AFB) SPUTUM  Final  Acid Fast Smear (AFB)     Status: None   Collection Time: 07/15/17  9:00 AM  Result Value Ref Range Status   AFB Specimen Processing Concentration  Final   Acid Fast Smear Negative  Final    Comment: (NOTE) Performed At: [facility] [place], [place] [id number] Rush Farmer MD [id number]    Source (AFB) LUNG  Final    Comment: RUL    Today   Subjective    Lavone Orn today has no headache,no chest abdominal pain,no new weakness tingling or numbness, feels much better wants to go home today.     Objective   Blood pressure (!) 151/86, pulse 79, temperature 97.6 F (36.4 C), temperature source Oral, resp. rate (!) 21, height 5\' 1"  (1.549 m), weight 53.3 kg (117 lb 9.6 oz), SpO2 93 %.  No intake or output data in the 24 hours ending 07/18/17 1016  Exam Awake Alert, Oriented x 3, No new F.N deficits, Normal affect [place].AT,PERRAL Supple Neck,No JVD, No cervical lymphadenopathy appriciated.  Symmetrical Chest wall movement, Good air movement bilaterally, CTAB RRR,No Gallops,Rubs or new  Murmurs, No Parasternal Heave +ve B.Sounds, Abd Soft, Non tender, No organomegaly appriciated, No rebound -guarding or rigidity. No Cyanosis, Clubbing or edema, No new Rash or bruise   Data Review   CBC w Diff:  Lab Results  Component Value Date   WBC 8.2 07/15/2017   HGB 11.6 (L) 07/15/2017   HCT 38.1 07/15/2017   PLT 339 07/15/2017   LYMPHOPCT 16 07/15/2017   MONOPCT 7 07/15/2017   EOSPCT 1 07/15/2017   BASOPCT 0 07/15/2017    CMP:  Lab Results  Component Value Date   NA 133 (L) 07/18/2017   K 4.0 07/18/2017   CL 91 (L) 07/18/2017   CO2 34 (H) 07/18/2017   BUN 13 07/18/2017   CREATININE 0.87 07/18/2017   CREATININE 0.86 11/08/2012   PROT 6.7 07/13/2017   ALBUMIN 3.6 07/13/2017   BILITOT 0.7 07/13/2017   ALKPHOS 72 07/13/2017   AST 20 07/13/2017   ALT 12 (L) 07/13/2017  .   Total Time in preparing paper work, data evaluation and todays exam - 53 minutes  Lala Lund M.D on 07/18/2017 at 10:16 AM  [facility]   Office  [phone]

## 2017-07-18 NOTE — Progress Notes (Signed)
SATURATION QUALIFICATIONS: (This note is used to comply with regulatory documentation for home oxygen)  Patient Saturations on Room Air at Rest = 88%  Patient Saturations on Room Air while Ambulating = 87%  Patient Saturations on 2 Liters of oxygen while Ambulating = 94%  Please briefly explain why patient needs home oxygen:  CHF  Please see note from [place]

## 2017-07-18 NOTE — Care Management Note (Signed)
Case Management Note  Patient Details  Name: Chelsea Baker MRN: [id number] Date of Birth: [birth year]  Subjective/Objective:   Lung cancer, CHF, hyponatremia, HTN                 Action/Plan: Spoke to pt and lives with son. States she was independent prior to hospital stay. Contacted [facility] for oxygen and neb machine order. Will deliver portable and neb machine to room prior to dc and concentrator to home. Made pt aware.   PCP Karma Greaser MD   Expected Discharge Date:  07/18/17               Expected Discharge Plan:  Home/Self Care  In-House Referral:  NA  Discharge planning Services  CM Consult  Post Acute Care Choice:  NA Choice offered to:  NA  DME Arranged:  Nebulizer machine, Oxygen DME Agency:  [place] Arranged:  NA HH Agency:  NA  Status of Service:  Completed, signed off  If discussed at [place] of Stay Meetings, dates discussed:    Additional Comments:  Erenest Rasher, RN 07/18/2017, 1:04 PM

## 2017-07-19 ENCOUNTER — Encounter (HOSPITAL_COMMUNITY): Payer: Self-pay | Admitting: Emergency Medicine

## 2017-07-19 ENCOUNTER — Emergency Department (HOSPITAL_COMMUNITY)
Admission: EM | Admit: 2017-07-19 | Discharge: 2017-07-19 | Disposition: A | Discharge status: Home / Self Care | Payer: PPO | Attending: Emergency Medicine | Admitting: Emergency Medicine

## 2017-07-19 ENCOUNTER — Emergency Department (HOSPITAL_COMMUNITY): Payer: PPO

## 2017-07-19 DIAGNOSIS — R0602 Shortness of breath: Secondary | ICD-10-CM | POA: Diagnosis not present

## 2017-07-19 DIAGNOSIS — J9611 Chronic respiratory failure with hypoxia: Secondary | ICD-10-CM | POA: Insufficient documentation

## 2017-07-19 DIAGNOSIS — I10 Essential (primary) hypertension: Secondary | ICD-10-CM | POA: Diagnosis not present

## 2017-07-19 DIAGNOSIS — R0682 Tachypnea, not elsewhere classified: Secondary | ICD-10-CM | POA: Diagnosis not present

## 2017-07-19 DIAGNOSIS — F17210 Nicotine dependence, cigarettes, uncomplicated: Secondary | ICD-10-CM | POA: Diagnosis not present

## 2017-07-19 HISTORY — DX: Neoplasm of unspecified behavior of respiratory system: D49.1

## 2017-07-19 LAB — I-STAT CHEM 8, ED
BUN: 6 mg/dL (ref 6–20)
Calcium, Ion: 1.11 mmol/L — ABNORMAL LOW (ref 1.15–1.40)
Chloride: 91 mmol/L — ABNORMAL LOW (ref 101–111)
Creatinine, Ser: 0.8 mg/dL (ref 0.44–1.00)
GLUCOSE: 149 mg/dL — AB (ref 65–99)
HEMATOCRIT: 40 % (ref 36.0–46.0)
HEMOGLOBIN: 13.6 g/dL (ref 12.0–15.0)
POTASSIUM: 3.3 mmol/L — AB (ref 3.5–5.1)
SODIUM: 136 mmol/L (ref 135–145)
TCO2: 32 mmol/L (ref 22–32)

## 2017-07-19 LAB — CBC WITH DIFFERENTIAL/PLATELET
BASOS ABS: 0 10*3/uL (ref 0.0–0.1)
Basophils Relative: 0 %
EOS PCT: 1 %
Eosinophils Absolute: 0.1 10*3/uL (ref 0.0–0.7)
HCT: 42.2 % (ref 36.0–46.0)
HEMOGLOBIN: 12.8 g/dL (ref 12.0–15.0)
LYMPHS ABS: 0.8 10*3/uL (ref 0.7–4.0)
LYMPHS PCT: 7 %
MCH: 26.2 pg (ref 26.0–34.0)
MCHC: 30.3 g/dL (ref 30.0–36.0)
MCV: 86.5 fL (ref 78.0–100.0)
Monocytes Absolute: 0.7 10*3/uL (ref 0.1–1.0)
Monocytes Relative: 6 %
NEUTROS ABS: 9.8 10*3/uL — AB (ref 1.7–7.7)
NEUTROS PCT: 86 %
PLATELETS: 329 10*3/uL (ref 150–400)
RBC: 4.88 MIL/uL (ref 3.87–5.11)
RDW: 14.6 % (ref 11.5–15.5)
WBC: 11.3 10*3/uL — AB (ref 4.0–10.5)

## 2017-07-19 LAB — I-STAT TROPONIN, ED: Troponin i, poc: 0.01 ng/mL (ref 0.00–0.08)

## 2017-07-19 LAB — BRAIN NATRIURETIC PEPTIDE: B NATRIURETIC PEPTIDE 5: 87.2 pg/mL (ref 0.0–100.0)

## 2017-07-19 NOTE — Discharge Instructions (Signed)
We saw you in the emergency room for shortness of breath. It seems like you required additional education on chronic home oxygen use.  Based on the discharge instruction it appears that you need to use 2 L of home oxygen all the time.  If you notice that you are requiring more oxygen to feel comfortable, coming to the emergency room right away.  It also appears that you have an appointment with cardiology, oncology, lung specialist -please make sure you follow-up with all of those doctors and your primary care doctor.

## 2017-07-19 NOTE — ED Provider Notes (Signed)
[facility] EMERGENCY DEPARTMENT Provider Note   CSN: [id number] Arrival date & time: 07/19/17  0026     History   Chief Complaint Chief Complaint  Patient presents with  . Shortness of Breath    Cough/Wheezing    HPI Chelsea Baker is a 72 y.o. female.  HPI  72 y.o. female history of hypertension, recently diagnosed CHF and lung CA comes in with cc of dib.  Patient reports after being discharged from the hospital she went home and fell asleep.  When she woke up she noted that she was short of breath.  She does not think her oxygen was on, or the machine was not working properly.  She called EMS because her breathing kept on getting worse.  Upon EMS arrival they were able to start patient on oxygen, and she has been feeling a lot better since then.  Patient denies any chest pain.  Of note, prior to the admission patient was not on any oxygen.  She at this time does not remember all the details/instructions provided on oxygen use.  Finally, it we also noted that patient had echocardiogram which showed CHF.  Patient's chest x-ray does not show volume overload, and the lung exam is not consistent with CHF either.  It appears that patient has been given outpatient follow-up with cardiology in 1 week.   Past Medical History:  Diagnosis Date  . AAA (abdominal aortic aneurysm) ([place])   . Diverticulosis   . GERD (gastroesophageal reflux disease)   . Hiatal hernia   . Hypertension   . Hypokalemia   . Lumbar spondylolysis   . Lung tumor     Patient Active Problem List   Diagnosis Date Noted  . Hypoxia   . Shortness of breath   . Current every day smoker   . Protein-calorie malnutrition ([place])   . Lung cancer ([place]) 07/12/2017  . Tachycardia 07/12/2017  . Hyponatremia 07/12/2017  . Cavitating mass in right upper lung lobe 07/12/2017  . AAA (abdominal aortic aneurysm) without rupture ([place]) 04/10/2014  . Aftercare following surgery of the circulatory system  04/10/2014  . Aftercare following surgery of the circulatory system, [place] 11/15/2012  . Abdominal aneurysm without mention of rupture 11/17/2011    Past Surgical History:  Procedure Laterality Date  . ABDOMINAL AORTIC ANEURYSM REPAIR  10/01/10   endostent repair    OB History    No data available       Home Medications    Prior to Admission medications   Medication Sig Start Date End Date Taking? Authorizing Provider  acetaminophen (TYLENOL) 500 MG tablet Take 500-1,000 mg by mouth every 6 (six) hours as needed for mild pain, moderate pain or headache.    [provider]  carvedilol (COREG) 3.125 MG tablet Take 1 tablet (3.125 mg total) by mouth 2 (two) times daily. 07/18/17 07/18/18  Thurnell Lose, MD  dextromethorphan-guaiFENesin (MUCINEX DM) 30-600 MG per 12 hr tablet Take 1 tablet by mouth every 12 (twelve) hours as needed.    [provider]  diphenhydrAMINE (SOMINEX) 25 MG tablet Take 25 mg by mouth as needed.    [provider]  ipratropium-albuterol (DUONEB) 0.5-2.5 (3) MG/3ML SOLN Take 3 mLs by nebulization every 4 (four) hours as needed. 07/18/17   Thurnell Lose, MD  lisinopril (PRINIVIL,ZESTRIL) 5 MG tablet Take 1 tablet (5 mg total) by mouth daily. 07/18/17 07/18/18  Thurnell Lose, MD  simvastatin (ZOCOR) 40 MG tablet Take 1 tablet (  40 mg total) by mouth at bedtime. 07/18/17   Thurnell Lose, MD    Family History Family History  Problem Relation Age of Onset  . Heart disease Mother   . Heart failure Mother   . Heart disease Father   . Coronary artery disease Father     Social History Social History   Tobacco Use  . Smoking status: Current Every Day Smoker    Packs/day: 1.00    Years: 50.00    Pack years: 50.00    Types: Cigarettes  . Smokeless tobacco: Never Used  Substance Use Topics  . Alcohol use: Yes    Comment: occasionally  . Drug use: No     Allergies   Patient has no known allergies.   Review of  Systems Review of Systems  Constitutional: Positive for activity change.  Respiratory: Positive for shortness of breath.   Cardiovascular: Negative for chest pain.  Hematological: Does not bruise/bleed easily.  All other systems reviewed and are negative.    Physical Exam Updated Vital Signs BP 111/67 (BP Location: Right Arm)   Pulse 88   Temp (!) 97.5 F (36.4 C) (Oral)   Resp (!) 22   Ht 5\' 1"  (1.549 m)   Wt 53.1 kg (117 lb)   SpO2 95%   BMI 22.11 kg/m   Physical Exam  Constitutional: She is oriented to person, place, and time. She appears well-developed.  HENT:  Head: Normocephalic and atraumatic.  Eyes: EOM are normal.  Neck: Normal range of motion. Neck supple. No JVD present.  Cardiovascular: Normal rate.  Pulmonary/Chest: Effort normal. She has no wheezes. She has no rales.  Abdominal: Bowel sounds are normal.  Musculoskeletal:       Right lower leg: She exhibits no tenderness and no edema.       Left lower leg: She exhibits no tenderness and no edema.  Neurological: She is alert and oriented to person, place, and time.  Skin: Skin is warm and dry.  Nursing note and vitals reviewed.    ED Treatments / Results  Labs (all labs ordered are listed, but only abnormal results are displayed) Labs Reviewed  CBC WITH DIFFERENTIAL/PLATELET - Abnormal; Notable for the following components:      Result Value   WBC 11.3 (*)    Neutro Abs 9.8 (*)    All other components within normal limits  I-STAT CHEM 8, ED - Abnormal; Notable for the following components:   Potassium 3.3 (*)    Chloride 91 (*)    Glucose, Bld 149 (*)    Calcium, Ion 1.11 (*)    All other components within normal limits  BRAIN NATRIURETIC PEPTIDE  I-STAT TROPONIN, ED    EKG  EKG Interpretation  Date/Time:  Monday July 19 2017 00:54:38 EST Ventricular Rate:  111 PR Interval:    QRS Duration: 85 QT Interval:  328 QTC Calculation: 446 R Axis:   59 Text Interpretation:  Sinus  tachycardia Probable left atrial enlargement Probable LVH with secondary repol abnrm No acute changes No significant change since last tracing Confirmed by Varney Biles [phone]) on 07/19/2017 1:35:20 AM       Radiology Dg Chest 2 View  Result Date: 07/19/2017 CLINICAL DATA:  Shortness of breath, wheezing, and chest congestion this evening. Previously diagnosed with right upper lobe lung mass. EXAM: CHEST  2 VIEW COMPARISON:  07/15/2017 FINDINGS: Normal heart size and pulmonary vascularity. Known right suprahilar mass lesion is obscured by overlying EKG lead and patch.  The mass appears to be in the posterior right upper lung on the lateral view. No developing consolidation or airspace disease. No blunting of costophrenic angles. No pneumothorax. Calcification of the aorta. Degenerative changes in the spine. IMPRESSION: Known right upper lung mass is obscured by the EKG lead and patch. On the lateral view, the mass appears to be in the posterior region. No developing consolidation or pneumothorax. Electronically Signed   By: Lucienne Capers M.D.   On: 07/19/2017 02:22   Ct Abdomen Pelvis W Contrast  Result Date: 07/17/2017 CLINICAL DATA:  Lung cancer evaluation. Evaluate for metastatic disease EXAM: CT ABDOMEN AND PELVIS WITH CONTRAST TECHNIQUE: Multidetector CT imaging of the abdomen and pelvis was performed using the standard protocol following bolus administration of intravenous contrast. CONTRAST:  46mL ISOVUE-300 IOPAMIDOL (ISOVUE-300) INJECTION 61%, 155mL ISOVUE-300 IOPAMIDOL (ISOVUE-300) INJECTION 61% COMPARISON:  CT 07/13/2017 FINDINGS: Lower chest: Lung bases are clear. Hepatobiliary: No focal hepatic lesion. No biliary duct dilatation. Gallbladder is normal. Common bile duct is normal. Pancreas: Pancreas is normal. No ductal dilatation. No pancreatic inflammation. Spleen: Normal spleen Adrenals/urinary tract: Adrenal glands and kidneys are normal. The ureters and bladder normal. Stomach/Bowel:  Stomach, small bowel, appendix, and cecum are normal. The colon and rectosigmoid colon are normal. Vascular/Lymphatic: Abdominal aorta is normal caliber. Aortic stent graft noted. There is no retroperitoneal or periportal lymphadenopathy. No pelvic lymphadenopathy. Reproductive: Post hysterectomy Other: No peritoneal disease Musculoskeletal: No aggressive osseous lesion. IMPRESSION: No evidence of metastatic disease in the abdomen pelvis. Electronically Signed   By: Suzy Bouchard M.D.   On: 07/17/2017 16:14    Procedures Procedures (including critical care time)  Medications Ordered in ED Medications - No data to display   Initial Impression / Assessment and Plan / ED Course  I have reviewed the triage vital signs and the nursing notes.  Pertinent labs & imaging results that were available during my care of the patient were reviewed by me and considered in my medical decision making (see chart for details).  Clinical Course as of Jul 19 920  Clemens Catholic  [id number] Respiratory therapist Linton Rump went in and personally assessed the patient and educated her on appropriate oxygen usage.  Patient informs [place] that she understands the utility of oxygen better. She has been made aware of the important follow-up appointments that are coming up.  She has been encouraged to ask appropriate questions during those visits.  ER return precautions also discussed.  [AN]    Clinical Course User Index [AN] Varney Biles, MD    72 year old female comes in with chief complaint of shortness of breath.  Patient was recently diagnosed with pulmonary cancer, and CHF.  It seems like patient needs better education on oxygen use.  We will consult respiratory to assess and help with that.  Patient is feeling a lot better at this time.  She was advised to use 2 L of oxygen all the time.  Currently on 2 L patient is also O2 sats are at 90-95%, but she is resting comfortably and not having the shortness of breath that  she was having at home.  It appears that patient has outpatient follow-up set up with oncology, pulmonary team and cardiology.   Final Clinical Impressions(s) / ED Diagnoses   Final diagnoses:  Chronic respiratory failure with hypoxia [facility])    ED Discharge Orders    None       Varney Biles, MD 07/19/17 [id number]

## 2017-07-19 NOTE — Progress Notes (Signed)
Pt states that she was discharge from [facility] today. Diagnosed with RUL lung mass. Pt has a history of smoking and COPD. Pt states that she was discharge with oxygen and needed to know some about it. education given on oxygen delivery per RT. Pt is stable at this time. No distress noted. Fine crackles noted in the bases

## 2017-07-19 NOTE — ED Triage Notes (Addendum)
Patient arrived with EMS from home reports SOB with wheezing and chest congestion onset this evening , pt. was discharged from [facility] yesterday afternoon - diagnosed with right upper lobe lung mass. Denies fever or chills . She received Albuterol 10 mg/Atrovent 0.5 mg nebulizer , NTG sl 0.4 mg x2 and Zofran 4 mg IV for nausea by EMS with mild relief.

## 2017-07-19 NOTE — ED Notes (Signed)
Taken to xray at this time. 

## 2017-07-22 ENCOUNTER — Telehealth: Payer: Self-pay | Admitting: Hematology

## 2017-07-22 NOTE — Telephone Encounter (Signed)
Hospital follow up appt has been scheduled for the pt to see Dr. [name] on 1/29 at 1220pm. Pt has agreed to the appt date and time. Msg fwd to MD and his nurse because the pt wants to reschedule her pulmonary function test to 1/29.

## 2017-07-26 ENCOUNTER — Ambulatory Visit (HOSPITAL_COMMUNITY)
Admission: RE | Admit: 2017-07-26 | Discharge: 2017-07-26 | Disposition: A | Discharge status: Home / Self Care | Payer: PPO | Source: Ambulatory Visit | Attending: Hematology | Admitting: Hematology

## 2017-07-26 DIAGNOSIS — C3490 Malignant neoplasm of unspecified part of unspecified bronchus or lung: Secondary | ICD-10-CM | POA: Diagnosis not present

## 2017-07-26 LAB — PULMONARY FUNCTION TEST
FEF 25-75 Pre: 0.32 L/sec
FEF2575-%Pred-Pre: 19 %
FEV1-%Pred-Pre: 26 %
FEV1-PRE: 0.52 L
FEV1FVC-%PRED-PRE: 88 %
FEV6-%PRED-PRE: 31 %
FEV6-PRE: 0.77 L
FEV6FVC-%Pred-Pre: 105 %
FVC-%Pred-Pre: 30 %
FVC-Pre: 0.77 L
PRE FEV1/FVC RATIO: 67 %
Pre FEV6/FVC Ratio: 100 %

## 2017-07-27 ENCOUNTER — Inpatient Hospital Stay: Payer: Self-pay | Admitting: Hematology

## 2017-07-27 ENCOUNTER — Encounter (HOSPITAL_COMMUNITY): Payer: Self-pay | Admitting: Emergency Medicine

## 2017-07-27 ENCOUNTER — Emergency Department (HOSPITAL_COMMUNITY)
Admission: EM | Admit: 2017-07-27 | Discharge: 2017-07-27 | Disposition: A | Discharge status: Home / Self Care | Payer: PPO | Attending: Emergency Medicine | Admitting: Emergency Medicine

## 2017-07-27 ENCOUNTER — Encounter: Payer: Self-pay | Admitting: Hematology

## 2017-07-27 ENCOUNTER — Inpatient Hospital Stay: Payer: PPO | Attending: Hematology | Admitting: Hematology

## 2017-07-27 ENCOUNTER — Emergency Department (HOSPITAL_COMMUNITY): Payer: PPO

## 2017-07-27 ENCOUNTER — Telehealth: Payer: Self-pay | Admitting: Hematology

## 2017-07-27 VITALS — BP 156/86 | HR 103 | Temp 98.1°F | Resp 40 | Ht 61.0 in | Wt 111.3 lb

## 2017-07-27 DIAGNOSIS — R0602 Shortness of breath: Secondary | ICD-10-CM | POA: Diagnosis not present

## 2017-07-27 DIAGNOSIS — E46 Unspecified protein-calorie malnutrition: Secondary | ICD-10-CM | POA: Diagnosis not present

## 2017-07-27 DIAGNOSIS — R531 Weakness: Secondary | ICD-10-CM | POA: Diagnosis not present

## 2017-07-27 DIAGNOSIS — K449 Diaphragmatic hernia without obstruction or gangrene: Secondary | ICD-10-CM | POA: Diagnosis not present

## 2017-07-27 DIAGNOSIS — I714 Abdominal aortic aneurysm, without rupture: Secondary | ICD-10-CM | POA: Insufficient documentation

## 2017-07-27 DIAGNOSIS — J441 Chronic obstructive pulmonary disease with (acute) exacerbation: Secondary | ICD-10-CM | POA: Diagnosis not present

## 2017-07-27 DIAGNOSIS — C3490 Malignant neoplasm of unspecified part of unspecified bronchus or lung: Secondary | ICD-10-CM

## 2017-07-27 DIAGNOSIS — K219 Gastro-esophageal reflux disease without esophagitis: Secondary | ICD-10-CM | POA: Diagnosis not present

## 2017-07-27 DIAGNOSIS — C3411 Malignant neoplasm of upper lobe, right bronchus or lung: Secondary | ICD-10-CM | POA: Diagnosis not present

## 2017-07-27 DIAGNOSIS — F419 Anxiety disorder, unspecified: Secondary | ICD-10-CM | POA: Diagnosis not present

## 2017-07-27 DIAGNOSIS — I10 Essential (primary) hypertension: Secondary | ICD-10-CM | POA: Insufficient documentation

## 2017-07-27 DIAGNOSIS — J479 Bronchiectasis, uncomplicated: Secondary | ICD-10-CM | POA: Diagnosis not present

## 2017-07-27 DIAGNOSIS — I429 Cardiomyopathy, unspecified: Secondary | ICD-10-CM | POA: Insufficient documentation

## 2017-07-27 DIAGNOSIS — F17210 Nicotine dependence, cigarettes, uncomplicated: Secondary | ICD-10-CM | POA: Insufficient documentation

## 2017-07-27 DIAGNOSIS — R0600 Dyspnea, unspecified: Secondary | ICD-10-CM

## 2017-07-27 DIAGNOSIS — E43 Unspecified severe protein-calorie malnutrition: Secondary | ICD-10-CM

## 2017-07-27 DIAGNOSIS — C3491 Malignant neoplasm of unspecified part of right bronchus or lung: Secondary | ICD-10-CM

## 2017-07-27 DIAGNOSIS — J449 Chronic obstructive pulmonary disease, unspecified: Secondary | ICD-10-CM | POA: Diagnosis not present

## 2017-07-27 HISTORY — DX: Dependence on supplemental oxygen: Z99.81

## 2017-07-27 LAB — COMPREHENSIVE METABOLIC PANEL
ALK PHOS: 87 U/L (ref 38–126)
ALT: 15 U/L (ref 14–54)
AST: 20 U/L (ref 15–41)
Albumin: 3.9 g/dL (ref 3.5–5.0)
Anion gap: 10 (ref 5–15)
BILIRUBIN TOTAL: 0.2 mg/dL — AB (ref 0.3–1.2)
BUN: 11 mg/dL (ref 6–20)
CALCIUM: 9.7 mg/dL (ref 8.9–10.3)
CO2: 32 mmol/L (ref 22–32)
CREATININE: 0.64 mg/dL (ref 0.44–1.00)
Chloride: 89 mmol/L — ABNORMAL LOW (ref 101–111)
Glucose, Bld: 123 mg/dL — ABNORMAL HIGH (ref 65–99)
Potassium: 3.9 mmol/L (ref 3.5–5.1)
Sodium: 131 mmol/L — ABNORMAL LOW (ref 135–145)
Total Protein: 7.7 g/dL (ref 6.5–8.1)

## 2017-07-27 LAB — CBC WITH DIFFERENTIAL/PLATELET
BASOS ABS: 0 10*3/uL (ref 0.0–0.1)
BASOS PCT: 0 %
EOS PCT: 4 %
Eosinophils Absolute: 0.4 10*3/uL (ref 0.0–0.7)
HEMATOCRIT: 42.6 % (ref 36.0–46.0)
Hemoglobin: 13.5 g/dL (ref 12.0–15.0)
Lymphocytes Relative: 12 %
Lymphs Abs: 1.3 10*3/uL (ref 0.7–4.0)
MCH: 27.3 pg (ref 26.0–34.0)
MCHC: 31.7 g/dL (ref 30.0–36.0)
MCV: 86.2 fL (ref 78.0–100.0)
MONOS PCT: 7 %
Monocytes Absolute: 0.7 10*3/uL (ref 0.1–1.0)
NEUTROS ABS: 7.9 10*3/uL — AB (ref 1.7–7.7)
Neutrophils Relative %: 77 %
PLATELETS: 436 10*3/uL — AB (ref 150–400)
RBC: 4.94 MIL/uL (ref 3.87–5.11)
RDW: 14.6 % (ref 11.5–15.5)
WBC: 10.3 10*3/uL (ref 4.0–10.5)

## 2017-07-27 LAB — URINALYSIS, ROUTINE W REFLEX MICROSCOPIC
BACTERIA UA: NONE SEEN
Bilirubin Urine: NEGATIVE
Glucose, UA: NEGATIVE mg/dL
Ketones, ur: 5 mg/dL — AB
NITRITE: NEGATIVE
SPECIFIC GRAVITY, URINE: 1.02 (ref 1.005–1.030)
pH: 5 (ref 5.0–8.0)

## 2017-07-27 LAB — TROPONIN I: TROPONIN I: 0.03 ng/mL — AB (ref <0.03)

## 2017-07-27 LAB — BRAIN NATRIURETIC PEPTIDE: B NATRIURETIC PEPTIDE 5: 49.6 pg/mL (ref 0.0–100.0)

## 2017-07-27 MED ORDER — DOXYCYCLINE HYCLATE 100 MG PO TABS: 100.0000 mg | ORAL_TABLET | Freq: Two times a day (BID) | ORAL | 0 refills | Status: DC

## 2017-07-27 MED ORDER — METHYLPREDNISOLONE SODIUM SUCC 125 MG IJ SOLR
125.0000 mg | Freq: Once | INTRAMUSCULAR | Status: AC
  Administered 2017-07-27: 125 mg via INTRAVENOUS
  Filled 2017-07-27: qty 2

## 2017-07-27 MED ORDER — ALBUTEROL SULFATE HFA 108 (90 BASE) MCG/ACT IN AERS: 2.0000 | INHALATION_SPRAY | RESPIRATORY_TRACT | 0 refills | Status: DC | PRN

## 2017-07-27 MED ORDER — ALBUTEROL SULFATE (2.5 MG/3ML) 0.083% IN NEBU: 5.0000 mg | INHALATION_SOLUTION | Freq: Once | RESPIRATORY_TRACT | Status: DC

## 2017-07-27 MED ORDER — PREDNISONE 20 MG PO TABS: 40.0000 mg | ORAL_TABLET | Freq: Every day | ORAL | 0 refills | Status: DC

## 2017-07-27 MED ORDER — PROMETHAZINE HCL 25 MG/ML IJ SOLN
6.2500 mg | Freq: Once | INTRAMUSCULAR | Status: AC
  Administered 2017-07-27: 6.25 mg via INTRAVENOUS
  Filled 2017-07-27: qty 1

## 2017-07-27 MED ORDER — ALBUTEROL (5 MG/ML) CONTINUOUS INHALATION SOLN
10.0000 mg/h | INHALATION_SOLUTION | Freq: Once | RESPIRATORY_TRACT | Status: AC
  Administered 2017-07-27: 10 mg/h via RESPIRATORY_TRACT

## 2017-07-27 MED ORDER — IPRATROPIUM-ALBUTEROL 0.5-2.5 (3) MG/3ML IN SOLN
3.0000 mL | Freq: Once | RESPIRATORY_TRACT | Status: DC
Start: 2017-07-27 — End: 2017-07-27
  Filled 2017-07-27: qty 3

## 2017-07-27 MED ORDER — ALBUTEROL SULFATE (2.5 MG/3ML) 0.083% IN NEBU
2.5000 mg | INHALATION_SOLUTION | Freq: Once | RESPIRATORY_TRACT | Status: DC
  Filled 2017-07-27: qty 3

## 2017-07-27 MED ORDER — IPRATROPIUM BROMIDE 0.02 % IN SOLN
1.0000 mg | Freq: Once | RESPIRATORY_TRACT | Status: AC
  Administered 2017-07-27: 1 mg via RESPIRATORY_TRACT

## 2017-07-27 NOTE — Telephone Encounter (Signed)
Gave avs and calendar for february °

## 2017-07-27 NOTE — Progress Notes (Signed)
HEMATOLOGY/ONCOLOGY CONSULTATION NOTE  Date of Service: 07/27/2017  Patient Care Team: [name], MD as PCP - General (Hematology)  CHIEF COMPLAINTS/PURPOSE OF CONSULTATION:  Newly diagnosed squamous cell carcinoma of the lung   HISTORY OF PRESENTING ILLNESS:    Chelsea Baker is a wonderful 72 y.o. female who has been referred to [place] by Dr .Sloan Leiter, Henreitta Leber, MD  for evaluation and management of newly diagnosed Squmous cell carcinoma of the RUL.  Patient has a history of hypertension, nicotine dependence, GERD, hiatal hernia, AAA and presented with a 2-week history of cough, weight loss about 20 pounds in about 6 months and increasing shortness of breath over the last several days prior to admission.  In the ED the patient had a chest x-ray which showed a 3.4 cm Which showed a 3.4 cm right upper lobe mass which appeared cavitary.  Subsequent CT of the chest showed a 3.5 cm cavitary mass involving the right upper lobe concerning for a primary bronchogenic carcinoma.  She was also noted to have a 4 mm nodule which is indeterminate in the right lower lobe.  No evidence of metastatic disease noted in the chest or upper abdomen. No evidence of pulmonary embolism.  Patient subsequently had a CT-guided biopsy of the right upper lobe lung lesion on 07/15/2017.  Pathology revealed squamous cell carcinoma of the lung. She had other workup including an AFB smear which was negative.  Patient notes that she does not really have a primary care physician and has not had good medical follow-up and does not really see a doctor regularly. She has had a history of smoking 1/2-2 packs/day for 50 years.  No diagnosed COPD though likely has significant decline in lung function at baseline. Currently was short of breath despite lung cancer not involving a large part of the lung at this time and no evidence of PE.  Still needing 2-3 L of oxygen by nasal cannula currently and endorses  shortness of breath with minimal walking.  She notes that she lives with her son.   INTERVAL HISTORY   Chelsea Baker is here for a posyt hospitalization follow up for mx of her newly diagnosed lung cancer.. She was last seen by me in the hospital. She presents to the clinic today accompanied by a family member.  She was discharge on home O2 2-3L/min [place]. She was recently in the ED for COPD exacerbation and was started on po steroids and inhalers and is feeling a little better. ECHO in the hospital showed EF of 35-40% - she has been given a referral to cardiology for further evaluation of systolic CHF - currently compensated.  On review of symptoms, pt noted DOE, fatigue grade 1.   MEDICAL HISTORY:  Past Medical History:  Diagnosis Date  . AAA (abdominal aortic aneurysm) ([place])   . Diverticulosis   . GERD (gastroesophageal reflux disease)   . Hiatal hernia   . Hypertension   . Hypokalemia   . Lumbar spondylolysis   . Lung tumor   . On home O2    2L N/C    SURGICAL HISTORY: Past Surgical History:  Procedure Laterality Date  . ABDOMINAL AORTIC ANEURYSM REPAIR  10/01/10   endostent repair    SOCIAL HISTORY: Social History   Socioeconomic History  . Marital status: Single    Spouse name: Not on file  . Number of children: Not on file  . Years of education: Not on file  . Highest education level:  Not on file  Social Needs  . Financial resource strain: Not on file  . Food insecurity - worry: Not on file  . Food insecurity - inability: Not on file  . Transportation needs - medical: Not on file  . Transportation needs - non-medical: Not on file  Occupational History  . Not on file  Tobacco Use  . Smoking status: Current Every Day Smoker    Packs/day: 1.00    Years: 50.00    Pack years: 50.00    Types: Cigarettes  . Smokeless tobacco: Never Used  Substance and Sexual Activity  . Alcohol use: Yes    Comment: occasionally  . Drug use: No  . Sexual activity: Not  on file  Other Topics Concern  . Not on file  Social History Narrative  . Not on file    FAMILY HISTORY: Family History  Problem Relation Age of Onset  . Heart disease Mother   . Heart failure Mother   . Heart disease Father   . Coronary artery disease Father     ALLERGIES:  has No Known Allergies.  MEDICATIONS:  Current Outpatient Medications  Medication Sig Dispense Refill  . acetaminophen (TYLENOL) 500 MG tablet Take 500-1,000 mg by mouth every 6 (six) hours as needed for mild pain, moderate pain or headache.    . albuterol (PROVENTIL HFA;VENTOLIN HFA) 108 (90 Base) MCG/ACT inhaler Inhale 2 puffs into the lungs every 4 (four) hours as needed for wheezing or shortness of breath. 1 Inhaler 0  . carvedilol (COREG) 3.125 MG tablet Take 1 tablet (3.125 mg total) by mouth 2 (two) times daily. 60 tablet 11  . diphenhydrAMINE (SOMINEX) 25 MG tablet Take 25 mg by mouth as needed.    . doxycycline (VIBRA-TABS) 100 MG tablet Take 1 tablet (100 mg total) by mouth 2 (two) times daily. 14 tablet 0  . ipratropium-albuterol (DUONEB) 0.5-2.5 (3) MG/3ML SOLN Take 3 mLs by nebulization every 4 (four) hours as needed. 360 mL 0  . lisinopril (PRINIVIL,ZESTRIL) 5 MG tablet Take 1 tablet (5 mg total) by mouth daily. 30 tablet 11  . predniSONE (DELTASONE) 20 MG tablet Take 2 tablets (40 mg total) by mouth daily. 10 tablet 0  . simvastatin (ZOCOR) 40 MG tablet Take 1 tablet (40 mg total) by mouth at bedtime. (Patient not taking: Reported on 07/27/2017) 30 tablet 0   No current facility-administered medications for this visit.     REVIEW OF SYSTEMS:    10 Point review of Systems was done is negative except as noted above.  PHYSICAL EXAMINATION: ECOG PERFORMANCE STATUS: 3 - Symptomatic, >50% confined to bed  . Vitals:   07/27/17 1403  BP: (!) 156/86  Pulse: (!) 103  Resp: (!) 40  Temp: 98.1 F (36.7 C)  SpO2: 100%   Filed Weights   07/27/17 1403  Weight: 111 lb 4.8 oz (50.5 kg)   .Body  mass index is 21.03 kg/m. NAD GENERAL:alert, in no acute distress and comfortable SKIN: no acute rashes, no significant lesions EYES: conjunctiva are pink and non-injected, sclera anicteric OROPHARYNX: MMM, no exudates, no oropharyngeal erythema or ulceration NECK: supple, no JVD LYMPH:  no palpable lymphadenopathy in the cervical, axillary or inguinal regions LUNGS: decreased air entry b/l, b/l scattered rhonci HEART: regular rate & rhythm ABDOMEN:  normoactive bowel sounds , non tender, not distended. Extremity: no pedal edema PSYCH: alert & oriented x 3 with fluent speech NEURO: no focal motor/sensory deficits  LABORATORY DATA:  I have reviewed the  data as listed  . CBC Latest Ref Rng & Units 07/27/2017 07/19/2017 07/19/2017  WBC 4.0 - 10.5 K/uL 10.3 - 11.3(H)  Hemoglobin 12.0 - 15.0 g/dL 13.5 13.6 12.8  Hematocrit 36.0 - 46.0 % 42.6 40.0 42.2  Platelets 150 - 400 K/uL 436(H) - 329    . CMP Latest Ref Rng & Units 07/27/2017 07/19/2017 07/18/2017  Glucose 65 - 99 mg/dL 123(H) 149(H) 107(H)  BUN 6 - 20 mg/dL 11 6 13   Creatinine 0.44 - 1.00 mg/dL 0.64 0.80 0.87  Sodium 135 - 145 mmol/L 131(L) 136 133(L)  Potassium 3.5 - 5.1 mmol/L 3.9 3.3(L) 4.0  Chloride 101 - 111 mmol/L 89(L) 91(L) 91(L)  CO2 22 - 32 mmol/L 32 - 34(H)  Calcium 8.9 - 10.3 mg/dL 9.7 - 9.5  Total Protein 6.5 - 8.1 g/dL 7.7 - -  Total Bilirubin 0.3 - 1.2 mg/dL 0.2(L) - -  Alkaline Phos 38 - 126 U/L 87 - -  AST 15 - 41 U/L 20 - -  ALT 14 - 54 U/L 15 - -    PATHOLOGY       RADIOGRAPHIC STUDIES: I have personally reviewed the radiological images as listed and agreed with the findings in the report. Dg Chest 2 View  Result Date: 07/27/2017 CLINICAL DATA:  Worsening short of breath.  History of lung cancer. EXAM: CHEST  2 VIEW COMPARISON:  07/19/2017 FINDINGS: The lungs are hyperinflated likely secondary to COPD. There is a 3.8 cm right upper lobe pulmonary mass. There is no pleural effusion or pneumothorax.  The heart and mediastinal contours are unremarkable. There is thoracic aortic atherosclerosis. The osseous structures are unremarkable. IMPRESSION: 1. COPD. 2. No acute cardiopulmonary disease. 3. 3.8 cm right upper lobe mass most consistent with lung cancer. Electronically Signed   By: Kathreen Devoid   On: 07/27/2017 08:17   Dg Chest 2 View  Result Date: 07/19/2017 CLINICAL DATA:  Shortness of breath, wheezing, and chest congestion this evening. Previously diagnosed with right upper lobe lung mass. EXAM: CHEST  2 VIEW COMPARISON:  07/15/2017 FINDINGS: Normal heart size and pulmonary vascularity. Known right suprahilar mass lesion is obscured by overlying EKG lead and patch. The mass appears to be in the posterior right upper lung on the lateral view. No developing consolidation or airspace disease. No blunting of costophrenic angles. No pneumothorax. Calcification of the aorta. Degenerative changes in the spine. IMPRESSION: Known right upper lung mass is obscured by the EKG lead and patch. On the lateral view, the mass appears to be in the posterior region. No developing consolidation or pneumothorax. Electronically Signed   By: Lucienne Capers M.D.   On: 07/19/2017 02:22   Dg Chest 2 View  Result Date: 07/12/2017 CLINICAL DATA:  Productive cough, fatigue and short of breath EXAM: CHEST  2 VIEW COMPARISON:  10/12/2010 radiograph FINDINGS: Approximate 3.4 cm right upper lobe lung mass, possibly with cavitation on the lateral view. No pleural effusion. Normal heart size. Aortic atherosclerosis. No pneumothorax. Partially visualized distal aortic stent. IMPRESSION: 1. Approximate 3.4 cm right upper lobe lung mass, possibly cavitary. Recommend CT chest for further evaluation. Electronically Signed   By: Donavan Foil M.D.   On: 07/12/2017 14:06   Ct Chest W Contrast  Addendum Date: 07/13/2017   ADDENDUM REPORT: 07/13/2017 07:56 ADDENDUM: I just spoke with Dr. Tessa Lerner by telephone and was asked to review  this exam for the presence of pulmonary embolus. I reviewed the images with focused attention on the pulmonary arteries.  There is no filling defect in the main pulmonary outflow tract, either main pulmonary artery, or lobar pulmonary arteries to either lung. Pulmonary arterial opacification is insufficient to exclude distal segmental and subsegmental embolus. Electronically Signed   By: Misty Stanley M.D.   On: 07/13/2017 07:56   Result Date: 07/13/2017 CLINICAL DATA:  Right upper lobe lung mass on chest x-ray earlier today. Patient presented to the emergency department with productive cough, fatigue and shortness of breath. EXAM: CT CHEST WITH CONTRAST TECHNIQUE: Multidetector CT imaging of the chest was performed during intravenous contrast administration. CONTRAST:  80mL ISOVUE-300 IOPAMIDOL INJECTION 61% IV. COMPARISON:  No prior CT.  Chest x-ray earlier today and 10/12/2010. FINDINGS: Cardiovascular: Normal heart size. Mild three-vessel coronary atherosclerosis. No pericardial effusion. Severe atherosclerosis involving the thoracic and upper abdominal aorta. The extreme proximal portion of the abdominal aortic stent graft is visible and is patent. Mediastinum/Nodes: Normal sized mediastinal and hilar lymph nodes, the largest a right paratracheal node (station 4R) measuring approximately 1.6 x 1.0 cm. Gas-filled esophagus. No evidence of hiatal hernia. Thyroid gland unremarkable. Lungs/Pleura: Spiculated mass involving the right upper lobe with maximum measurements approximating 3.5 x 3.0 x 3.5 cm, cavitary with gas and fluid centrally. Approximate 0.4 x 0.3 cm nodule involving the superior segment right lower lobe (series 5, image 63). No nodules or masses elsewhere in either lung. Emphysematous changes throughout both lungs. Scar/bronchiectasis involving the lingula. No pleural effusions. Upper Abdomen: Diffuse hepatic steatosis without focal parenchymal abnormality involving the visualized liver. Visualized  upper abdomen otherwise unremarkable. Musculoskeletal: Degenerative disc disease and spondylosis involving the thoracic spine in the visualized lower cervical spine. No acute osseous abnormality. No evidence of osseous metastatic disease. IMPRESSION: 1. Approximate 3.5 cm cavitary mass involving the right upper lobe, most likely indicating a primary bronchogenic carcinoma. 2. Indeterminate 4 mm nodule involving the superior segment right lower lobe. Attention to this nodule on subsequent imaging is recommended. 3. No evidence of metastatic disease otherwise involving the chest or upper abdomen. 4. Diffuse hepatic steatosis without focal parenchymal abnormality involving the visualized liver. Aortic Atherosclerosis (ICD10-I70.0) and Emphysema (ICD10-J43.9). Electronically Signed: By: Evangeline Dakin M.D. On: 07/12/2017 16:31   Ct Angio Chest Pe W Or Wo Contrast  Result Date: 07/13/2017 CLINICAL DATA:  Shortness of breath, evaluate for PE. Cavitary right upper lobe mass. EXAM: CT ANGIOGRAPHY CHEST WITH CONTRAST TECHNIQUE: Multidetector CT imaging of the chest was performed using the standard protocol during bolus administration of intravenous contrast. Multiplanar CT image reconstructions and MIPs were obtained to evaluate the vascular anatomy. CONTRAST:  53mL ISOVUE-370 IOPAMIDOL (ISOVUE-370) INJECTION 76% COMPARISON:  CT chest dated 07/12/2017. FINDINGS: Cardiovascular: Satisfactory opacification of the bilateral pulmonary artery to the segmental level. No evidence of pulmonary embolism. No evidence of thoracic aortic aneurysm. Atherosclerotic calcifications of the aortic arch. The heart is normal in size.  No pericardial effusion. Otherwise, unchanged from recent CT yesterday. Mediastinum/Nodes: Small mediastinal lymph nodes measuring up to 7 mm short axis. No suspicious hilar or axillary lymphadenopathy. Visualized thyroid is unremarkable. Lungs/Pleura: 3.4 x 3.3 cm partially cavitary mass in the posterior  right upper lobe (series 10/image 36), suspicious for primary bronchogenic neoplasm. Underlying mild centrilobular emphysematous changes. 4 mm satellite nodule in the superior segment right lower lobe (series 10/image 61). Additional mild nodularity anteriorly in the left upper lobe (series 10/image 69), possibly infectious. No pleural effusion or pneumothorax. Upper Abdomen: Visualized upper abdomen is notable for an incompletely visualized abdominal aortic stent. Musculoskeletal: Mild degenerative changes  of the visualized thoracolumbar spine. Review of the MIP images confirms the above findings. IMPRESSION: No evidence of pulmonary embolism. Otherwise unchanged from recent CT chest. 3.4 cm partially cavitary mass in the posterior right upper lobe, suspicious for primary bronchogenic neoplasm. Aortic Atherosclerosis (ICD10-I70.0) and Emphysema (ICD10-J43.9). Electronically Signed   By: Julian Hy M.D.   On: 07/13/2017 10:53   Ct Abdomen Pelvis W Contrast  Result Date: 07/17/2017 CLINICAL DATA:  Lung cancer evaluation. Evaluate for metastatic disease EXAM: CT ABDOMEN AND PELVIS WITH CONTRAST TECHNIQUE: Multidetector CT imaging of the abdomen and pelvis was performed using the standard protocol following bolus administration of intravenous contrast. CONTRAST:  65mL ISOVUE-300 IOPAMIDOL (ISOVUE-300) INJECTION 61%, 169mL ISOVUE-300 IOPAMIDOL (ISOVUE-300) INJECTION 61% COMPARISON:  CT 07/13/2017 FINDINGS: Lower chest: Lung bases are clear. Hepatobiliary: No focal hepatic lesion. No biliary duct dilatation. Gallbladder is normal. Common bile duct is normal. Pancreas: Pancreas is normal. No ductal dilatation. No pancreatic inflammation. Spleen: Normal spleen Adrenals/urinary tract: Adrenal glands and kidneys are normal. The ureters and bladder normal. Stomach/Bowel: Stomach, small bowel, appendix, and cecum are normal. The colon and rectosigmoid colon are normal. Vascular/Lymphatic: Abdominal aorta is normal  caliber. Aortic stent graft noted. There is no retroperitoneal or periportal lymphadenopathy. No pelvic lymphadenopathy. Reproductive: Post hysterectomy Other: No peritoneal disease Musculoskeletal: No aggressive osseous lesion. IMPRESSION: No evidence of metastatic disease in the abdomen pelvis. Electronically Signed   By: Suzy Bouchard M.D.   On: 07/17/2017 16:14   Ct Biopsy  Result Date: 07/15/2017 INDICATION: Right upper lobe lung mass EXAM: CT BIOPSY MEDICATIONS: None. ANESTHESIA/SEDATION: Fentanyl 50 mcg IV; Versed 1 mg IV Moderate Sedation Time:  11 minutes The patient was continuously monitored during the procedure by the interventional radiology nurse under my direct supervision. FLUOROSCOPY TIME:  Fluoroscopy Time:  minutes  seconds ( mGy). COMPLICATIONS: None immediate. PROCEDURE: Informed written consent was obtained from the patient after a thorough discussion of the procedural risks, benefits and alternatives. All questions were addressed. Maximal Sterile Barrier Technique was utilized including caps, mask, sterile gowns, sterile gloves, sterile drape, hand hygiene and skin antiseptic. A timeout was performed prior to the initiation of the procedure. Under CT guidance, a 17 gauge needle was inserted into the left upper lobe lung mass. Three cores were obtained. Two were placed in formalin. One was placed in saline for tissue culture and AFB analysis. Biosintry plug was deployed and the guide needle was removed. Post biopsy images demonstrate no pneumothorax. Patient tolerated the procedure well without complication. Vital sign monitoring by nursing staff during the procedure will continue as patient is in the special procedures unit for post procedure observation. FINDINGS: The images document guide needle placement within the right upper lobe lung mass. Post biopsy images demonstrate no hemorrhage. IMPRESSION: Successful CT-guided right upper lobe lung mass core biopsy. Electronically Signed    By: Marybelle Killings M.D.   On: 07/15/2017 11:26   Dg Chest Port 1 View  Result Date: 07/15/2017 CLINICAL DATA:  Status post lung biopsy EXAM: PORTABLE CHEST 1 VIEW COMPARISON:  Chest x-ray of July 12, 2017 FINDINGS: The soft tissue mass in the posterior aspect of the right upper lobe is again demonstrated. There is no post biopsy pneumothorax or hemothorax. The left lung is well-expanded and clear. The heart and pulmonary vascularity are normal. The mediastinum is normal in width. There is mild multilevel degenerative disc disease of the thoracic spine. IMPRESSION: No postprocedure complication following percutaneous biopsy of the right upper lobe mass. Electronically  Signed   By: David  Martinique M.D.   On: 07/15/2017 10:01    ASSESSMENT & PLAN:  DARRION MACAULAY is a 72 y.o. female with    1) Newly diagnosed right upper lobe squamous cell carcinoma of the lung. Presenting with a cavitary mass.  Biopsy proven to be squamous cell carcinoma. No overt mediastinal or hilar lymphadenopathy noted. Could be early stage though staging workup pending. Indeterminate RLL nodule 35mm.  PLAN -I discussed the patient's available imaging findings and pathology results showing new diagnosis of squamous cell carcinoma of the lung. -PET/CTand MRI brain to complete staging w/u -will need radiation oncology consultation -given referral. Ifno evidence of metastatic disease -could consider SBRT to the primary lesion. -patient poor candidate for surgery.  2) Dyspnea and rest and on minimal exertion. CT chest with no PE Likely significant COPD at baseline -  Needing 2-3 L of oxygen by nasal cannula in hospital. Has not been on home oxygen for but notes that she might have needed it.   ECHO ef 35-40% PLAN  -Pulmonary function testing -severe obstruction -noted to have systolic cardiomyopathy - outpatient cardiology evlauation -ECHO noted  Decreased PT - ECHO - will need to see early optimize mx -optimize mx  of COPD with PCP/pulmonologist.  3) Poor medical f/u. Past Medical History:  Diagnosis Date  . AAA (abdominal aortic aneurysm) ([place])   . Diverticulosis   . GERD (gastroesophageal reflux disease)   . Hiatal hernia   . Hypertension   . Hypokalemia   . Lumbar spondylolysis   . Lung tumor   . On home O2    2L N/C   4) Protein calorie malnutrition -nutritional consultation  5) Heavy smoker 75-100 pk-yrs -Discussed smoking cessation-in details   PLAN -Since the patient does not have a primary care physician and poor medical follow-up will try to get as much of the staging workup as possible as inpatient. She remains at high risk for non compliance with medical followup. -will need to setup PCP ASAP    . Orders Placed This Encounter  Procedures  . NM PET Image Initial (PI) Skull Base To Thigh    Standing Status:   Future    Standing Expiration Date:   07/27/2018    Order Specific Question:   If indicated for the ordered procedure, I authorize the administration of a radiopharmaceutical per Radiology protocol    Answer:   Yes    Order Specific Question:   Preferred imaging location?    Answer:   [facility]    Order Specific Question:   Radiology Contrast Protocol - do NOT remove file path    Answer:   [email or web address]  . MR Brain W Wo Contrast    Standing Status:   Future    Standing Expiration Date:   07/27/2018    Order Specific Question:   If indicated for the ordered procedure, I authorize the administration of contrast media per Radiology protocol    Answer:   Yes    Order Specific Question:   What is the patient's sedation requirement?    Answer:   No Sedation    Order Specific Question:   Does the patient have a pacemaker or implanted devices?    Answer:   No    Order Specific Question:   Radiology Contrast Protocol - do NOT remove file path    Answer:   \\[email or web address]    Order Specific Question:    Reason for Exam additional comments  Answer:   intial staging for lung cancer    Order Specific Question:   Preferred imaging location?    Answer:   [facility] (table limit-350 lbs)  . CBC & Diff and Retic    Standing Status:   Future    Standing Expiration Date:   07/27/2018  . CMP ([place] only)    Standing Status:   Future    Standing Expiration Date:   07/27/2018  . BNP (Brain natriuretic peptide)    Standing Status:   Future    Standing Expiration Date:   07/27/2018  . Ambulatory referral to Radiation Oncology    Referral Priority:   Urgent    Referral Type:   Consultation    Referral Reason:   Specialty Services Required    Requested Specialty:   Radiation Oncology    Number of Visits Requested:   1  . Amb Referral to Nutrition and Diabetic E    Referral Priority:   Urgent    Referral Type:   Consultation    Referral Reason:   Specialty Services Required    Number of Visits Requested:   1   PET/CT in 4-5 days MRI Brain in 4-5 days Radiation Oncology referral to Dr Isidore Moos for likely early stage lung cancer Nutritional therapy referral RTC with Dr [name] in 2 weeks with labs   All of the patients questions were answered with apparent satisfaction. The patient knows to call the clinic with any problems, questions or concerns.  I spent 30 minutes counseling the patient face to face. The total time spent in the appointment was 40 minutes and more than 50% was on counseling and direct patient cares.    Sullivan Lone MD [place] AAHIVMS [facility] Hematology/Oncology Physician [facility]  (Office):       [phone] (Work cell):  [phone] (Fax):           [phone]  07/27/2017 12:24 PM  This document serves as a record of services personally performed by Sullivan Lone, MD. It was created on his behalf by Joslyn Devon, a trained medical scribe. The creation of this record is based on the scribe's personal observations and the provider's statements  to them.    .I have reviewed the above documentation for accuracy and completeness, and I agree with the above. [name] MD MS

## 2017-07-27 NOTE — ED Notes (Signed)
Respiratory called/notified regarding breathing treatment.

## 2017-07-27 NOTE — ED Provider Notes (Signed)
[facility] DEPT Provider Note   CSN: [id number] Arrival date & time: 07/27/17  8546     History   Chief Complaint Chief Complaint  Patient presents with  . Shortness of Breath    HPI Chelsea Baker is a 72 y.o. female.  HPI  Pt was seen at 0730.  Per pt, c/o gradual onset and worsening of persistent SOB for the past 1+ weeks. Has been associated with generalized weakness/fatigue and decreased appetite.  Has been using home O2 without relief.  Denies CP/palpitations, no back pain, no abd pain, no N/V/D, no fevers, no rash.    Past Medical History:  Diagnosis Date  . AAA (abdominal aortic aneurysm) ([place])   . Diverticulosis   . GERD (gastroesophageal reflux disease)   . Hiatal hernia   . Hypertension   . Hypokalemia   . Lumbar spondylolysis   . Lung tumor   . On home O2    2L N/C    Patient Active Problem List   Diagnosis Date Noted  . Hypoxia   . Shortness of breath   . Current every day smoker   . Protein-calorie malnutrition ([place])   . Lung cancer ([place]) 07/12/2017  . Tachycardia 07/12/2017  . Hyponatremia 07/12/2017  . Cavitating mass in right upper lung lobe 07/12/2017  . AAA (abdominal aortic aneurysm) without rupture ([place]) 04/10/2014  . Aftercare following surgery of the circulatory system 04/10/2014  . Aftercare following surgery of the circulatory system, [place] 11/15/2012  . Abdominal aneurysm without mention of rupture 11/17/2011    Past Surgical History:  Procedure Laterality Date  . ABDOMINAL AORTIC ANEURYSM REPAIR  10/01/10   endostent repair    OB History    No data available       Home Medications    Prior to Admission medications   Medication Sig Start Date End Date Taking? Authorizing Provider  acetaminophen (TYLENOL) 500 MG tablet Take 500-1,000 mg by mouth every 6 (six) hours as needed for mild pain, moderate pain or headache.    [provider]  carvedilol (COREG) 3.125 MG tablet Take 1 tablet  (3.125 mg total) by mouth 2 (two) times daily. 07/18/17 07/18/18  Thurnell Lose, MD  dextromethorphan-guaiFENesin (MUCINEX DM) 30-600 MG per 12 hr tablet Take 1 tablet by mouth every 12 (twelve) hours as needed.    [provider]  diphenhydrAMINE (SOMINEX) 25 MG tablet Take 25 mg by mouth as needed.    [provider]  ipratropium-albuterol (DUONEB) 0.5-2.5 (3) MG/3ML SOLN Take 3 mLs by nebulization every 4 (four) hours as needed. 07/18/17   Thurnell Lose, MD  lisinopril (PRINIVIL,ZESTRIL) 5 MG tablet Take 1 tablet (5 mg total) by mouth daily. 07/18/17 07/18/18  Thurnell Lose, MD  simvastatin (ZOCOR) 40 MG tablet Take 1 tablet (40 mg total) by mouth at bedtime. 07/18/17   Thurnell Lose, MD    Family History Family History  Problem Relation Age of Onset  . Heart disease Mother   . Heart failure Mother   . Heart disease Father   . Coronary artery disease Father     Social History Social History   Tobacco Use  . Smoking status: Current Every Day Smoker    Packs/day: 1.00    Years: 50.00    Pack years: 50.00    Types: Cigarettes  . Smokeless tobacco: Never Used  Substance Use Topics  . Alcohol use: Yes    Comment: occasionally  . Drug use: No  Allergies   Patient has no known allergies.   Review of Systems Review of Systems ROS: Statement: All systems negative except as marked or noted in the HPI; Constitutional: Negative for fever and chills. +generalized weakness/fatigue, decreased appetite.; ; Eyes: Negative for eye pain, redness and discharge. ; ; ENMT: Negative for ear pain, hoarseness, nasal congestion, sinus pressure and sore throat. ; ; Cardiovascular: Negative for chest pain, palpitations, diaphoresis, and peripheral edema. ; ; Respiratory: +SOB. Negative for cough, wheezing and stridor. ; ; Gastrointestinal: Negative for nausea, vomiting, diarrhea, abdominal pain, blood in stool, hematemesis, jaundice and rectal bleeding. . ; ;  Genitourinary: Negative for dysuria, flank pain and hematuria. ; ; Musculoskeletal: Negative for back pain and neck pain. Negative for swelling and trauma.; ; Skin: Negative for pruritus, rash, abrasions, blisters, bruising and skin lesion.; ; Neuro: Negative for headache, lightheadedness and neck stiffness. Negative for altered level of consciousness, altered mental status, extremity weakness, paresthesias, involuntary movement, seizure and syncope.       Physical Exam Updated Vital Signs BP 127/74 (BP Location: Right Arm)   Pulse (!) 56   Temp 97.9 F (36.6 C) (Oral)   Resp (!) 22   Physical Exam 0735: Physical examination:  Nursing notes reviewed; Vital signs and O2 SAT reviewed;  Constitutional: Well developed, Well nourished, Well hydrated, In no acute distress; Head:  Normocephalic, atraumatic; Eyes: EOMI, PERRL, No scleral icterus; ENMT: Mouth and pharynx normal, Mucous membranes moist; Neck: Supple, Full range of motion, No lymphadenopathy; Cardiovascular: Regular rate and rhythm, No gallop; Respiratory: Breath sounds coarse & equal bilaterally, faint scattered wheezes. No audible wheezing. Sitting upright. Mild tachypnea. Speaking full sentences, Normal respiratory effort/excursion; Chest: Nontender, Movement normal; Abdomen: Soft, Nontender, Nondistended, Normal bowel sounds; Genitourinary: No CVA tenderness; Extremities: Pulses normal, No tenderness, No edema, No calf edema or asymmetry.; Neuro: AA&Ox3, Major CN grossly intact.  Speech clear. No gross focal motor or sensory deficits in extremities.; Skin: Color normal, Warm, Dry.   ED Treatments / Results  Labs (all labs ordered are listed, but only abnormal results are displayed)   EKG  EKG Interpretation  Date/Time:  Tuesday July 27 2017 07:04:24 EST Ventricular Rate:  110 PR Interval:    QRS Duration: 80 QT Interval:  316 QTC Calculation: 428 R Axis:   60 Text Interpretation:  Sinus tachycardia Atrial premature  complexes LVH with secondary repolarization abnormality Baseline wander Artifact When compared with ECG of 07/19/2017 No significant change was found Confirmed by Francine Graven [phone]) on 07/27/2017 7:43:42 AM       Radiology   Procedures Procedures (including critical care time)  Medications Ordered in ED Medications  albuterol (PROVENTIL,VENTOLIN) solution continuous neb (10 mg/hr Nebulization Given 07/27/17 0853)  ipratropium (ATROVENT) nebulizer solution 1 mg (1 mg Nebulization Given 07/27/17 0853)  methylPREDNISolone sodium succinate (SOLU-MEDROL) 125 mg/2 mL injection 125 mg (125 mg Intravenous Given 07/27/17 0924)  promethazine (PHENERGAN) injection 6.25 mg (6.25 mg Intravenous Given 07/27/17 0926)     Initial Impression / Assessment and Plan / ED Course  I have reviewed the triage vital signs and the nursing notes.  Pertinent labs & imaging results that were available during my care of the patient were reviewed by me and considered in my medical decision making (see chart for details).  MDM Reviewed: previous chart, nursing note and vitals Reviewed previous: labs and ECG Interpretation: labs, ECG and x-ray    Results for orders placed or performed during the hospital encounter of 07/27/17  Comprehensive metabolic  panel  Result Value Ref Range   Sodium 131 (L) 135 - 145 mmol/L   Potassium 3.9 3.5 - 5.1 mmol/L   Chloride 89 (L) 101 - 111 mmol/L   CO2 32 22 - 32 mmol/L   Glucose, Bld 123 (H) 65 - 99 mg/dL   BUN 11 6 - 20 mg/dL   Creatinine, Ser 0.64 0.44 - 1.00 mg/dL   Calcium 9.7 8.9 - 10.3 mg/dL   Total Protein 7.7 6.5 - 8.1 g/dL   Albumin 3.9 3.5 - 5.0 g/dL   AST 20 15 - 41 U/L   ALT 15 14 - 54 U/L   Alkaline Phosphatase 87 38 - 126 U/L   Total Bilirubin 0.2 (L) 0.3 - 1.2 mg/dL   GFR calc non Af Amer >60 >60 mL/min   GFR calc Af Amer >60 >60 mL/min   Anion gap 10 5 - 15  Brain natriuretic peptide  Result Value Ref Range   B Natriuretic Peptide 49.6 0.0 -  100.0 pg/mL  Troponin I  Result Value Ref Range   Troponin I 0.03 (HH) <0.03 ng/mL  CBC with Differential  Result Value Ref Range   WBC 10.3 4.0 - 10.5 K/uL   RBC 4.94 3.87 - 5.11 MIL/uL   Hemoglobin 13.5 12.0 - 15.0 g/dL   HCT 42.6 36.0 - 46.0 %   MCV 86.2 78.0 - 100.0 fL   MCH 27.3 26.0 - 34.0 pg   MCHC 31.7 30.0 - 36.0 g/dL   RDW 14.6 11.5 - 15.5 %   Platelets 436 (H) 150 - 400 K/uL   Neutrophils Relative % 77 %   Neutro Abs 7.9 (H) 1.7 - 7.7 K/uL   Lymphocytes Relative 12 %   Lymphs Abs 1.3 0.7 - 4.0 K/uL   Monocytes Relative 7 %   Monocytes Absolute 0.7 0.1 - 1.0 K/uL   Eosinophils Relative 4 %   Eosinophils Absolute 0.4 0.0 - 0.7 K/uL   Basophils Relative 0 %   Basophils Absolute 0.0 0.0 - 0.1 K/uL  Urinalysis, Routine w reflex microscopic  Result Value Ref Range   Color, Urine YELLOW YELLOW   APPearance CLEAR CLEAR   Specific Gravity, Urine 1.020 1.005 - 1.030   pH 5.0 5.0 - 8.0   Glucose, UA NEGATIVE NEGATIVE mg/dL   Hgb urine dipstick SMALL (A) NEGATIVE   Bilirubin Urine NEGATIVE NEGATIVE   Ketones, ur 5 (A) NEGATIVE mg/dL   Protein, ur >=300 (A) NEGATIVE mg/dL   Nitrite NEGATIVE NEGATIVE   Leukocytes, UA TRACE (A) NEGATIVE   RBC / HPF 0-5 0 - 5 RBC/hpf   WBC, UA 0-5 0 - 5 WBC/hpf   Bacteria, UA NONE SEEN NONE SEEN   Squamous Epithelial / LPF 0-5 (A) NONE SEEN   Mucus PRESENT    Hyaline Casts, UA PRESENT    Dg Chest 2 View Result Date: 07/27/2017 CLINICAL DATA:  Worsening short of breath.  History of lung cancer. EXAM: CHEST  2 VIEW COMPARISON:  07/19/2017 FINDINGS: The lungs are hyperinflated likely secondary to COPD. There is a 3.8 cm right upper lobe pulmonary mass. There is no pleural effusion or pneumothorax. The heart and mediastinal contours are unremarkable. There is thoracic aortic atherosclerosis. The osseous structures are unremarkable. IMPRESSION: 1. COPD. 2. No acute cardiopulmonary disease. 3. 3.8 cm right upper lobe mass most consistent with lung  cancer. Electronically Signed   By: Kathreen Devoid   On: 07/27/2017 08:17    1150:  Pt given hour long  neb and IV solumedrol. Pt also c/o anxiety and nausea, IV phenergan given with good effect. Pt states she "feels better" after neb and steroid.  NAD, lungs diminished bilat, no wheezing, resps easy, speaking full sentences, Sats 100% on O2 2L N/C.  Pt ambulated around the ED with Sats remaining 94-95 % on her usual O2 2L N/C, resps without distress, NAD. Dx and testing d/w pt and family.  Questions answered.  Verb understanding.  Pt and family informed re: top normal troponin level and that I would like to obtain a 2nd level.  Pt refuses to stay, stating she has a doctor's appointment she "must" get to and "leave right now."  I encouraged pt to stay, continues to refuse.  Pt makes her own medical decisions. Pt and family verb understanding the consequences of their decision.  I encouraged pt to follow up with her PMD tomorrow and return to the ED immediately if symptoms return, worsen, or for any other concerns.            Final Clinical Impressions(s) / ED Diagnoses   Final diagnoses:  None    ED Discharge Orders    None       Francine Graven, DO 07/29/17 2321

## 2017-07-27 NOTE — ED Triage Notes (Signed)
Pt reports worsening sob and anxiety. Pt denies cp. Pt reports recent hospitalization. Pt A+OX4.

## 2017-07-27 NOTE — ED Notes (Addendum)
Pt. Ambulated with no assist. Pt. Walked from room to charge desk with a HR of 110-118 and Oxygen level on 2 liters. Pt. sats were 94-95%. Pt stated she felt wobbly. Nurse aware.

## 2017-07-27 NOTE — ED Notes (Signed)
Pt has denied troponin at this time stating that " it is more important for me to get to the cancer center". Doctor has been notified at this time.

## 2017-07-27 NOTE — Discharge Instructions (Signed)
Take the prescriptions as directed.  Use your albuterol inhaler (2 to 4 puffs) or your albuterol nebulizer (1 unit dose) every 4 hours for the next 7 days, then as needed for cough, wheezing, or shortness of breath.  Call your regular medical doctor today to schedule a follow up appointment within the next 2 days.  Return to the Emergency Department immediately sooner if worsening.  ° °

## 2017-07-27 NOTE — ED Notes (Signed)
RN notified of abnormal lab 

## 2017-07-28 ENCOUNTER — Encounter: Payer: Self-pay | Admitting: Radiation Oncology

## 2017-07-29 ENCOUNTER — Telehealth: Payer: Self-pay

## 2017-07-29 NOTE — Telephone Encounter (Signed)
Called pt and left VM regarding imaging schedule. Both PET and MRI scheduled for 08/11/17. Pt to arrive to Radiology at 0830 (directions given). Pt to remain NPO after midnight. Call back number provided if pt has any questions [phone].

## 2017-07-30 ENCOUNTER — Other Ambulatory Visit: Payer: Self-pay | Admitting: Hematology

## 2017-07-30 DIAGNOSIS — C3490 Malignant neoplasm of unspecified part of unspecified bronchus or lung: Secondary | ICD-10-CM

## 2017-07-30 NOTE — Progress Notes (Addendum)
Thoracic Location of Tumor / Histology: Squamous cell carcinoma of the RUL  Patient presented with s 2-week history of cough, weight loss of about 20 pounds in 6 months and increasing shortness of breath. Patient presented to a walk in clinic thinking she had the flu. She was evaluated and instructed to go to the ER. Patient did not go to ER as order. Patient presented to ER several days later after she didn't get to feeling any better.   Biopsies revealed:   07/15/17 Diagnosis Lung, needle/core biopsy(ies), RUL - SQUAMOUS CELL CARCINOMA.  Tobacco/Marijuana/Snuff/ETOH use: smoked 1 ppd for 50 years, quit 06/2017.  Rare ETOH use.  Past/Anticipated interventions by cardiothoracic surgery, if any: Patient is a poor surgical candidate due to oxygen needs and smoking history.  Past/Anticipated interventions by medical oncology, if any: consulted, referral to radiation oncology, referral to [facility] to rule out CHF  Signs/Symptoms  Weight changes, if any: yes, 20 lb   Respiratory complaints, if any: shortness of breath with mild activity, dry cough (frequency and intensity less)  Hemoptysis, if any: no  Pain issues, if any:  no  SAFETY ISSUES:  Prior radiation? no  Pacemaker/ICD? no   Possible current pregnancy?no  Is the patient on methotrexate? no  Current Complaints / other details:  72 year old female. Single. Accompanied by her daughter. Works full time IT trainer. Resides in [place]. PET scan and MRI brain scheduled for 08/11/17. Denies headache, dizziness, nausea, vomiting, diplopia or tinnitus. Patient reports eating one meal per day. Daughter concerned about patient's appetite. Continuous oxygen therapy 2 liters via nasal cannula noted.

## 2017-08-02 ENCOUNTER — Telehealth: Payer: Self-pay

## 2017-08-02 NOTE — Telephone Encounter (Signed)
Pt MRI appt cancelled in relation to pt concern based on past trauma. CT of head scheduled for 2/6 at 60 in [facility] Radiology. Directions provided to pt via VM. Pt to arrive at 1115 and may use valet parking. Noted that pt PET scan would remain on 2/13 at 0900 as previously scheduled. Pt given Central Scheduling number [phone] if need to reschedule PET or CT. Shared that Delle Reining, RN will be at the desk for the remainder of the week. If pt should have any questions for our office may call [phone].

## 2017-08-03 ENCOUNTER — Encounter: Payer: Self-pay | Admitting: Radiation Oncology

## 2017-08-03 ENCOUNTER — Other Ambulatory Visit: Payer: Self-pay

## 2017-08-03 ENCOUNTER — Ambulatory Visit
Admission: RE | Admit: 2017-08-03 | Discharge: 2017-08-03 | Disposition: A | Discharge status: Home / Self Care | Payer: PPO | Source: Ambulatory Visit | Attending: Radiation Oncology | Admitting: Radiation Oncology

## 2017-08-03 VITALS — BP 167/92 | HR 106 | Temp 98.0°F | Resp 18 | Ht 61.0 in | Wt 111.0 lb

## 2017-08-03 DIAGNOSIS — Z9841 Cataract extraction status, right eye: Secondary | ICD-10-CM | POA: Diagnosis not present

## 2017-08-03 DIAGNOSIS — C3411 Malignant neoplasm of upper lobe, right bronchus or lung: Secondary | ICD-10-CM | POA: Diagnosis not present

## 2017-08-03 DIAGNOSIS — Z79899 Other long term (current) drug therapy: Secondary | ICD-10-CM | POA: Diagnosis not present

## 2017-08-03 DIAGNOSIS — J449 Chronic obstructive pulmonary disease, unspecified: Secondary | ICD-10-CM | POA: Insufficient documentation

## 2017-08-03 DIAGNOSIS — Z9981 Dependence on supplemental oxygen: Secondary | ICD-10-CM | POA: Diagnosis not present

## 2017-08-03 DIAGNOSIS — Z8249 Family history of ischemic heart disease and other diseases of the circulatory system: Secondary | ICD-10-CM | POA: Diagnosis not present

## 2017-08-03 DIAGNOSIS — I10 Essential (primary) hypertension: Secondary | ICD-10-CM | POA: Insufficient documentation

## 2017-08-03 DIAGNOSIS — Z8679 Personal history of other diseases of the circulatory system: Secondary | ICD-10-CM | POA: Diagnosis not present

## 2017-08-03 DIAGNOSIS — K219 Gastro-esophageal reflux disease without esophagitis: Secondary | ICD-10-CM | POA: Insufficient documentation

## 2017-08-03 DIAGNOSIS — Z9842 Cataract extraction status, left eye: Secondary | ICD-10-CM | POA: Insufficient documentation

## 2017-08-03 DIAGNOSIS — F17211 Nicotine dependence, cigarettes, in remission: Secondary | ICD-10-CM | POA: Diagnosis not present

## 2017-08-03 DIAGNOSIS — Z87891 Personal history of nicotine dependence: Secondary | ICD-10-CM | POA: Diagnosis not present

## 2017-08-03 HISTORY — DX: Malignant neoplasm of unspecified part of unspecified bronchus or lung: C34.90

## 2017-08-03 NOTE — Progress Notes (Signed)
See progress note under physician encounter. 

## 2017-08-03 NOTE — Patient Outreach (Signed)
[facility]) Care Management  08/03/2017   Chelsea Baker Nov 23, 1945 [id number]  Transition of care  Referral date: 08/03/17 Referral source: [facility] utilization management referral Insurance: Health team advantage  Telephone call to patient for utilization management referral. HIPAA verified with patient.  Discussed reason for call. Patient states she was recently discharged from the hospital with new diagnosis of lung cancer and congestive heart failure. Discussed transition of care follow up program with patient. Patient verbally agreed to program.  Patient states she was initially admitted to the hospital due to shortness of breath. Patient states she is on oxygen at 2 Liters. Patient states she is unsure of the name of the company that is providing the oxygen. Patient states she is out with her daughter and does not have the information with her.  Patient states she has a doctors appointment scheduled with a new primary provider, Mina Marble, Nurse practitioner. Patient states she has not seen a primary provider in 7 years prior. Patient states she has her first cardiology appointment on 08/10/17 and a follow up appointment with the oncologist in 1 week. Patient states she has radiation visits set up. Patient states she does not feel she needs in home follow up. Patient states she has good support from her son and daughter. Patient reports her children provide her transportation to appointments.  Patient states she has her medications and takes them as prescribed. Patient states she is not weighing everyday. RNCM discussed importance of weighing and recording weights daily.  RNCM explained to patient she would need to notify her doctor if she has a 3 lbs weight gain or overnight or 5 lbs weight gain in 1 week.  RNCM advised patient to notify MD of any changes in condition prior to scheduled appointment. RNCM provided contact name and number: [phone] or main office number  [phone] and 24 hour nurse advise line [phone].  RNCM verified patient aware of 911 services for urgent/ emergent needs. Patient verbally agreed to next telephone outreach with [facility].   Current Medications:  Current Outpatient Medications  Medication Sig Dispense Refill  . acetaminophen (TYLENOL) 500 MG tablet Take 500-1,000 mg by mouth every 6 (six) hours as needed for mild pain, moderate pain or headache.    . albuterol (PROVENTIL HFA;VENTOLIN HFA) 108 (90 Base) MCG/ACT inhaler Inhale 2 puffs into the lungs every 4 (four) hours as needed for wheezing or shortness of breath. 1 Inhaler 0  . carvedilol (COREG) 3.125 MG tablet Take 1 tablet (3.125 mg total) by mouth 2 (two) times daily. 60 tablet 11  . ipratropium-albuterol (DUONEB) 0.5-2.5 (3) MG/3ML SOLN Take 3 mLs by nebulization every 4 (four) hours as needed. 360 mL 0  . lisinopril (PRINIVIL,ZESTRIL) 5 MG tablet Take 1 tablet (5 mg total) by mouth daily. (Patient not taking: Reported on 07/27/2017) 30 tablet 11  . simvastatin (ZOCOR) 40 MG tablet Take 1 tablet (40 mg total) by mouth at bedtime. (Patient not taking: Reported on 07/27/2017) 30 tablet 0   No current facility-administered medications for this visit.     Functional Status:  In your present state of health, do you have any difficulty performing the following activities: 07/12/2017  Hearing? N  Vision? N  Difficulty concentrating or making decisions? N  Walking or climbing stairs? Y  Comment gets short of breath the last week  Dressing or bathing? N  Doing errands, shopping? N  Some recent data might be hidden    Fall/Depression Screening: Fall Risk  08/03/2017  Falls in the past year? No   PHQ 2/9 Scores 08/03/2017  PHQ - 2 Score 0    [facility] CM Care Plan Problem One     Most Recent Value  Care Plan Problem One  potential for readmission due to recent hospitalization   Role Documenting the Problem One  Care Management Telephonic Coordinator  Care Plan for  Problem One  Active  [facility] Long Term Goal   patient will report no hospital readmission within 60 days  [facility] Long Term Goal Start Date  08/03/17  Interventions for Problem One Long Term Goal  RNCM advised patient to take her medications as prescribed, keep follow up appointments with her new doctors, report non emergent symptoms to her doctors    [facility] CM Care Plan Problem Two     Most Recent Value  Care Plan Problem Two  New diagnosis of congestive heart failure  Role Documenting the Problem Two  Care Management Telephonic Coordinator  Care Plan for Problem Two  Active  Interventions for Problem Two Long Term Goal   RNCM sent heart failure information to patient  [facility] Long Term Goal  Patient will be able to state heart failure action plan/ zones within 45 days  [facility] Long Term Goal Start Date  08/03/17  [facility] CM Short Term Goal #1   Patient will report  that she is checking and recording her weights daily within 3 weeks  [facility] CM Short Term Goal #1 Start Date  08/03/17  Interventions for Short Term Goal #2   EMMI education material sent to patient regarding congestive heart failure. RNCM discussed importance of monitoring weight daily due to congestive heart failure diagnosis.     [facility] CM Care Plan Problem Three     Most Recent Value  Care Plan Problem Three  New diagnosis of lung cancer  Role Documenting the Problem Three  Care Management Telephonic Coordinator  Care Plan for Problem Three  Active  [facility] CM Short Term Goal #1   patient will be able to state 3 symptoms related to exacerbation of lung cancer  [facility] CM Short Term Goal #1 Start Date  08/03/17  Interventions for Short Term Goal #1  RNCM sent patient EMMI education material on lung ca.       Assessment:  New diagnosis of lung cancer and systolic and diastolic heart failure at 07/12/17 admission  Plan:  RNCM will follow up with patient within 1 week . RNCM will send notification to care management assistant to activate patient to [facility]  telephonic transition of care follow up. RNCM will send patient welcome packet and consent form RNCM will send patients primary MD involvement letter [facility] will send patient EMMI education material on heart failure and lung cancer.   Quinn Plowman RN,BSN,CCM [facility] Telephonic  [phone]

## 2017-08-03 NOTE — Progress Notes (Signed)
Radiation Oncology         [phone]) [phone] ________________________________  Name: [name]        MRN: [id number]  Date of Service: 08/03/2017 DOB: September 13, 1945  ER:DEYC, Cloria Spring, MD  [name], MD     REFERRING PHYSICIAN: [name], MD   DIAGNOSIS: The encounter diagnosis was Malignant neoplasm of right upper lobe of lung [facility]).   HISTORY OF PRESENT ILLNESS: Chelsea Baker is a 72 y.o. female seen at the request of Dr. [name] for a newly diagnosed right upper lobe lung cancer. The patient was seen and evaluated in the ED due to productive cough and fever. She was found on imaging on 07/12/17 during her work up to have a 3.5 cm mass in the RUL and a 4 mm lesion in the RLL. No adenopathy was noted. She underwent CT guided biopsy on 07/15/17 that revealed a squamous cell carcinoma. She also had a CT abdomen/pelvis on 07/17/17 that did not reveal evidence of disease. She will have a CT brain tomorrow rather than brain MRI given previous experience that the patient cannot quite recall. She is due for PET scan on 08/11/17. She comes today to discuss options of radiotherapy for treatment of her cancer.  PREVIOUS RADIATION THERAPY: No   PAST MEDICAL HISTORY:  Past Medical History:  Diagnosis Date  . AAA (abdominal aortic aneurysm) ([place])   . Diverticulosis   . GERD (gastroesophageal reflux disease)   . Hiatal hernia   . Hypertension   . Hypokalemia   . Lumbar spondylolysis   . Lung cancer ([place])   . Lung tumor   . On home O2    2L N/C       PAST SURGICAL HISTORY: Past Surgical History:  Procedure Laterality Date  . ABDOMINAL AORTIC ANEURYSM REPAIR  10/01/10   endostent repair  . CATARACT EXTRACTION, BILATERAL    . LUNG BIOPSY    . TONSILLECTOMY       FAMILY HISTORY:  Family History  Problem Relation Age of Onset  . Heart disease Mother   . Heart failure Mother   . Heart disease Father   . Coronary artery disease Father   . Cancer Neg Hx       SOCIAL HISTORY:  reports that she quit smoking about 5 weeks ago. Her smoking use included cigarettes. She has a 50.00 pack-year smoking history. she has never used smokeless tobacco. She reports that she drinks alcohol. She reports that she does not use drugs. The patient is single and lives in [place]. She used to work at [employer] but is currently out of work due to her illness.   ALLERGIES: Patient has no known allergies.   MEDICATIONS:  Current Outpatient Medications  Medication Sig Dispense Refill  . acetaminophen (TYLENOL) 500 MG tablet Take 500-1,000 mg by mouth every 6 (six) hours as needed for mild pain, moderate pain or headache.    . albuterol (PROVENTIL HFA;VENTOLIN HFA) 108 (90 Base) MCG/ACT inhaler Inhale 2 puffs into the lungs every 4 (four) hours as needed for wheezing or shortness of breath. 1 Inhaler 0  . carvedilol (COREG) 3.125 MG tablet Take 1 tablet (3.125 mg total) by mouth 2 (two) times daily. 60 tablet 11  . ipratropium-albuterol (DUONEB) 0.5-2.5 (3) MG/3ML SOLN Take 3 mLs by nebulization every 4 (four) hours as needed. 360 mL 0  . lisinopril (PRINIVIL,ZESTRIL) 5 MG tablet Take 1 tablet (5 mg total) by mouth daily. (Patient not taking: Reported  on 07/27/2017) 30 tablet 11  . simvastatin (ZOCOR) 40 MG tablet Take 1 tablet (40 mg total) by mouth at bedtime. (Patient not taking: Reported on 07/27/2017) 30 tablet 0   No current facility-administered medications for this encounter.      REVIEW OF SYSTEMS: On review of systems, the patient reports that she3 is doing well overall. She denies any chest pain. She has productive mucous but denies hemoptysis. She is not having fevers any longer. She She denies any bowel or bladder disturbances, and denies abdominal pain, nausea or vomiting. She denies any new musculoskeletal or joint aches or pains. A complete review of systems is obtained and is otherwise negative.     PHYSICAL EXAM:  Wt Readings from Last 3  Encounters:  08/03/17 111 lb (50.3 kg)  07/27/17 111 lb 4.8 oz (50.5 kg)  07/19/17 117 lb (53.1 kg)   Temp Readings from Last 3 Encounters:  07/27/17 98.1 F (36.7 C) (Oral)  07/27/17 97.9 F (36.6 C) (Oral)  07/19/17 (!) 97.5 F (36.4 C) (Oral)   BP Readings from Last 3 Encounters:  07/27/17 (!) 156/86  07/27/17 (!) 160/91  07/19/17 111/67   Pulse Readings from Last 3 Encounters:  07/27/17 (!) 103  07/27/17 (!) 108  07/19/17 88   Pain Assessment Pain Score: 0-No pain/10  In general this is a chronically ill appearing caucasian female in no acute distress. She is alert and oriented x4 and appropriate throughout the examination. She also continues to wear O2 at 2 L via [place]. HEENT reveals that the patient is normocephalic, atraumatic. EOMs are intact. PERRLA. Skin is intact without any evidence of gross lesions. Cardiovascular exam reveals a regular rate and rhythm, no clicks rubs or murmurs are auscultated. Chest is clear to auscultation in the right mid and lower fields as well as in the left fields. There are crackles noted in the right upper lung field. Lymphatic assessment is performed and does not reveal any adenopathy in the cervical, supraclavicular, or axillary chains.   ECOG = 1  0 - Asymptomatic (Fully active, able to carry on all predisease activities without restriction)  1 - Symptomatic but completely ambulatory (Restricted in physically strenuous activity but ambulatory and able to carry out work of a light or sedentary nature. For example, light housework, office work)  2 - Symptomatic, <50% in bed during the day (Ambulatory and capable of all self care but unable to carry out any work activities. Up and about more than 50% of waking hours)  3 - Symptomatic, >50% in bed, but not bedbound (Capable of only limited self-care, confined to bed or chair 50% or more of waking hours)  4 - Bedbound (Completely disabled. Cannot carry on any self-care. Totally confined to bed  or chair)  5 - Death   Eustace Pen MM, Creech RH, Tormey DC, et al. [phone]). "Toxicity and response criteria of the [facility]". [place] Oncol. 5 (6): 649-55    LABORATORY DATA:  Lab Results  Component Value Date   WBC 10.3 07/27/2017   HGB 13.5 07/27/2017   HCT 42.6 07/27/2017   MCV 86.2 07/27/2017   PLT 436 (H) 07/27/2017   Lab Results  Component Value Date   NA 131 (L) 07/27/2017   K 3.9 07/27/2017   CL 89 (L) 07/27/2017   CO2 32 07/27/2017   Lab Results  Component Value Date   ALT 15 07/27/2017   AST 20 07/27/2017   ALKPHOS 87 07/27/2017  BILITOT 0.2 (L) 07/27/2017      RADIOGRAPHY: Dg Chest 2 View  Result Date: 07/27/2017 CLINICAL DATA:  Worsening short of breath.  History of lung cancer. EXAM: CHEST  2 VIEW COMPARISON:  07/19/2017 FINDINGS: The lungs are hyperinflated likely secondary to COPD. There is a 3.8 cm right upper lobe pulmonary mass. There is no pleural effusion or pneumothorax. The heart and mediastinal contours are unremarkable. There is thoracic aortic atherosclerosis. The osseous structures are unremarkable. IMPRESSION: 1. COPD. 2. No acute cardiopulmonary disease. 3. 3.8 cm right upper lobe mass most consistent with lung cancer. Electronically Signed   By: Kathreen Devoid   On: 07/27/2017 08:17   Dg Chest 2 View  Result Date: 07/19/2017 CLINICAL DATA:  Shortness of breath, wheezing, and chest congestion this evening. Previously diagnosed with right upper lobe lung mass. EXAM: CHEST  2 VIEW COMPARISON:  07/15/2017 FINDINGS: Normal heart size and pulmonary vascularity. Known right suprahilar mass lesion is obscured by overlying EKG lead and patch. The mass appears to be in the posterior right upper lung on the lateral view. No developing consolidation or airspace disease. No blunting of costophrenic angles. No pneumothorax. Calcification of the aorta. Degenerative changes in the spine. IMPRESSION: Known right upper lung mass is obscured by  the EKG lead and patch. On the lateral view, the mass appears to be in the posterior region. No developing consolidation or pneumothorax. Electronically Signed   By: Lucienne Capers M.D.   On: 07/19/2017 02:22   Dg Chest 2 View  Result Date: 07/12/2017 CLINICAL DATA:  Productive cough, fatigue and short of breath EXAM: CHEST  2 VIEW COMPARISON:  10/12/2010 radiograph FINDINGS: Approximate 3.4 cm right upper lobe lung mass, possibly with cavitation on the lateral view. No pleural effusion. Normal heart size. Aortic atherosclerosis. No pneumothorax. Partially visualized distal aortic stent. IMPRESSION: 1. Approximate 3.4 cm right upper lobe lung mass, possibly cavitary. Recommend CT chest for further evaluation. Electronically Signed   By: Donavan Foil M.D.   On: 07/12/2017 14:06   Ct Chest W Contrast  Addendum Date: 07/13/2017   ADDENDUM REPORT: 07/13/2017 07:56 ADDENDUM: I just spoke with Dr. Tessa Lerner by telephone and was asked to review this exam for the presence of pulmonary embolus. I reviewed the images with focused attention on the pulmonary arteries. There is no filling defect in the main pulmonary outflow tract, either main pulmonary artery, or lobar pulmonary arteries to either lung. Pulmonary arterial opacification is insufficient to exclude distal segmental and subsegmental embolus. Electronically Signed   By: Misty Stanley M.D.   On: 07/13/2017 07:56   Result Date: 07/13/2017 CLINICAL DATA:  Right upper lobe lung mass on chest x-ray earlier today. Patient presented to the emergency department with productive cough, fatigue and shortness of breath. EXAM: CT CHEST WITH CONTRAST TECHNIQUE: Multidetector CT imaging of the chest was performed during intravenous contrast administration. CONTRAST:  5mL ISOVUE-300 IOPAMIDOL INJECTION 61% IV. COMPARISON:  No prior CT.  Chest x-ray earlier today and 10/12/2010. FINDINGS: Cardiovascular: Normal heart size. Mild three-vessel coronary atherosclerosis. No  pericardial effusion. Severe atherosclerosis involving the thoracic and upper abdominal aorta. The extreme proximal portion of the abdominal aortic stent graft is visible and is patent. Mediastinum/Nodes: Normal sized mediastinal and hilar lymph nodes, the largest a right paratracheal node (station 4R) measuring approximately 1.6 x 1.0 cm. Gas-filled esophagus. No evidence of hiatal hernia. Thyroid gland unremarkable. Lungs/Pleura: Spiculated mass involving the right upper lobe with maximum measurements approximating 3.5 x 3.0 x  3.5 cm, cavitary with gas and fluid centrally. Approximate 0.4 x 0.3 cm nodule involving the superior segment right lower lobe (series 5, image 63). No nodules or masses elsewhere in either lung. Emphysematous changes throughout both lungs. Scar/bronchiectasis involving the lingula. No pleural effusions. Upper Abdomen: Diffuse hepatic steatosis without focal parenchymal abnormality involving the visualized liver. Visualized upper abdomen otherwise unremarkable. Musculoskeletal: Degenerative disc disease and spondylosis involving the thoracic spine in the visualized lower cervical spine. No acute osseous abnormality. No evidence of osseous metastatic disease. IMPRESSION: 1. Approximate 3.5 cm cavitary mass involving the right upper lobe, most likely indicating a primary bronchogenic carcinoma. 2. Indeterminate 4 mm nodule involving the superior segment right lower lobe. Attention to this nodule on subsequent imaging is recommended. 3. No evidence of metastatic disease otherwise involving the chest or upper abdomen. 4. Diffuse hepatic steatosis without focal parenchymal abnormality involving the visualized liver. Aortic Atherosclerosis (ICD10-I70.0) and Emphysema (ICD10-J43.9). Electronically Signed: By: Evangeline Dakin M.D. On: 07/12/2017 16:31   Ct Angio Chest Pe W Or Wo Contrast  Result Date: 07/13/2017 CLINICAL DATA:  Shortness of breath, evaluate for PE. Cavitary right upper lobe  mass. EXAM: CT ANGIOGRAPHY CHEST WITH CONTRAST TECHNIQUE: Multidetector CT imaging of the chest was performed using the standard protocol during bolus administration of intravenous contrast. Multiplanar CT image reconstructions and MIPs were obtained to evaluate the vascular anatomy. CONTRAST:  6mL ISOVUE-370 IOPAMIDOL (ISOVUE-370) INJECTION 76% COMPARISON:  CT chest dated 07/12/2017. FINDINGS: Cardiovascular: Satisfactory opacification of the bilateral pulmonary artery to the segmental level. No evidence of pulmonary embolism. No evidence of thoracic aortic aneurysm. Atherosclerotic calcifications of the aortic arch. The heart is normal in size.  No pericardial effusion. Otherwise, unchanged from recent CT yesterday. Mediastinum/Nodes: Small mediastinal lymph nodes measuring up to 7 mm short axis. No suspicious hilar or axillary lymphadenopathy. Visualized thyroid is unremarkable. Lungs/Pleura: 3.4 x 3.3 cm partially cavitary mass in the posterior right upper lobe (series 10/image 36), suspicious for primary bronchogenic neoplasm. Underlying mild centrilobular emphysematous changes. 4 mm satellite nodule in the superior segment right lower lobe (series 10/image 61). Additional mild nodularity anteriorly in the left upper lobe (series 10/image 69), possibly infectious. No pleural effusion or pneumothorax. Upper Abdomen: Visualized upper abdomen is notable for an incompletely visualized abdominal aortic stent. Musculoskeletal: Mild degenerative changes of the visualized thoracolumbar spine. Review of the MIP images confirms the above findings. IMPRESSION: No evidence of pulmonary embolism. Otherwise unchanged from recent CT chest. 3.4 cm partially cavitary mass in the posterior right upper lobe, suspicious for primary bronchogenic neoplasm. Aortic Atherosclerosis (ICD10-I70.0) and Emphysema (ICD10-J43.9). Electronically Signed   By: Julian Hy M.D.   On: 07/13/2017 10:53   Ct Abdomen Pelvis W  Contrast  Result Date: 07/17/2017 CLINICAL DATA:  Lung cancer evaluation. Evaluate for metastatic disease EXAM: CT ABDOMEN AND PELVIS WITH CONTRAST TECHNIQUE: Multidetector CT imaging of the abdomen and pelvis was performed using the standard protocol following bolus administration of intravenous contrast. CONTRAST:  35mL ISOVUE-300 IOPAMIDOL (ISOVUE-300) INJECTION 61%, 16mL ISOVUE-300 IOPAMIDOL (ISOVUE-300) INJECTION 61% COMPARISON:  CT 07/13/2017 FINDINGS: Lower chest: Lung bases are clear. Hepatobiliary: No focal hepatic lesion. No biliary duct dilatation. Gallbladder is normal. Common bile duct is normal. Pancreas: Pancreas is normal. No ductal dilatation. No pancreatic inflammation. Spleen: Normal spleen Adrenals/urinary tract: Adrenal glands and kidneys are normal. The ureters and bladder normal. Stomach/Bowel: Stomach, small bowel, appendix, and cecum are normal. The colon and rectosigmoid colon are normal. Vascular/Lymphatic: Abdominal aorta is normal caliber. Aortic  stent graft noted. There is no retroperitoneal or periportal lymphadenopathy. No pelvic lymphadenopathy. Reproductive: Post hysterectomy Other: No peritoneal disease Musculoskeletal: No aggressive osseous lesion. IMPRESSION: No evidence of metastatic disease in the abdomen pelvis. Electronically Signed   By: Suzy Bouchard M.D.   On: 07/17/2017 16:14   Ct Biopsy  Result Date: 07/15/2017 INDICATION: Right upper lobe lung mass EXAM: CT BIOPSY MEDICATIONS: None. ANESTHESIA/SEDATION: Fentanyl 50 mcg IV; Versed 1 mg IV Moderate Sedation Time:  11 minutes The patient was continuously monitored during the procedure by the interventional radiology nurse under my direct supervision. FLUOROSCOPY TIME:  Fluoroscopy Time:  minutes  seconds ( mGy). COMPLICATIONS: None immediate. PROCEDURE: Informed written consent was obtained from the patient after a thorough discussion of the procedural risks, benefits and alternatives. All questions were  addressed. Maximal Sterile Barrier Technique was utilized including caps, mask, sterile gowns, sterile gloves, sterile drape, hand hygiene and skin antiseptic. A timeout was performed prior to the initiation of the procedure. Under CT guidance, a 17 gauge needle was inserted into the left upper lobe lung mass. Three cores were obtained. Two were placed in formalin. One was placed in saline for tissue culture and AFB analysis. Biosintry plug was deployed and the guide needle was removed. Post biopsy images demonstrate no pneumothorax. Patient tolerated the procedure well without complication. Vital sign monitoring by nursing staff during the procedure will continue as patient is in the special procedures unit for post procedure observation. FINDINGS: The images document guide needle placement within the right upper lobe lung mass. Post biopsy images demonstrate no hemorrhage. IMPRESSION: Successful CT-guided right upper lobe lung mass core biopsy. Electronically Signed   By: Marybelle Killings M.D.   On: 07/15/2017 11:26   Dg Chest Port 1 View  Result Date: 07/15/2017 CLINICAL DATA:  Status post lung biopsy EXAM: PORTABLE CHEST 1 VIEW COMPARISON:  Chest x-ray of July 12, 2017 FINDINGS: The soft tissue mass in the posterior aspect of the right upper lobe is again demonstrated. There is no post biopsy pneumothorax or hemothorax. The left lung is well-expanded and clear. The heart and pulmonary vascularity are normal. The mediastinum is normal in width. There is mild multilevel degenerative disc disease of the thoracic spine. IMPRESSION: No postprocedure complication following percutaneous biopsy of the right upper lobe mass. Electronically Signed   By: David  Martinique M.D.   On: 07/15/2017 10:01       IMPRESSION/PLAN: 1. Stage IB, [id number], NSCLC, squamous cell carcinoma of the right upper lobe. Dr. Lisbeth Renshaw discusses the pathology findings and reviews the nature of early stage lung cancer. We will follow up with the  results of tomorrow's CT of the head and her PET scan on 08/11/17. Otherwise we would anticipate a course of stereotactic radiotherapy (SBRT) as she is not a surgical candidate for lobectomy. We discussed the risks, benefits, short, and long term effects of radiotherapy, and the patient is interested in proceeding. Dr. Lisbeth Renshaw discusses the delivery and logistics of radiotherapy and anticipates a course of 5 fractions due to the location of her tumor. We will contact her to coordinate simulation following or close to the time of her PET scan. Written consent is obtained and placed in the chart, a copy was provided to the patient. 2. COPD with O2 dependence. The patient will follow up on 08/25/17 for an evaluation with a new PCP. She is counseled also on the role of pulmonary medicine seeing her as an alternative option.    In a visit  lasting 60 minutes, greater than 50% of the time was spent face to face discussing her case, and coordinating the patient's care.  The above documentation reflects my direct findings during this shared patient visit. Please see the separate note by Dr. Lisbeth Renshaw on this date for the remainder of the patient's plan of care.    Carola Rhine, PAC

## 2017-08-04 ENCOUNTER — Ambulatory Visit (HOSPITAL_COMMUNITY)
Admission: RE | Admit: 2017-08-04 | Discharge: 2017-08-04 | Disposition: A | Discharge status: Home / Self Care | Payer: PPO | Source: Ambulatory Visit | Attending: Hematology | Admitting: Hematology

## 2017-08-04 DIAGNOSIS — C3490 Malignant neoplasm of unspecified part of unspecified bronchus or lung: Secondary | ICD-10-CM

## 2017-08-04 DIAGNOSIS — C50919 Malignant neoplasm of unspecified site of unspecified female breast: Secondary | ICD-10-CM | POA: Diagnosis not present

## 2017-08-04 DIAGNOSIS — C3491 Malignant neoplasm of unspecified part of right bronchus or lung: Secondary | ICD-10-CM | POA: Diagnosis not present

## 2017-08-05 ENCOUNTER — Telehealth: Payer: Self-pay | Admitting: Radiation Oncology

## 2017-08-05 NOTE — Telephone Encounter (Signed)
I called the patient to review CT. We will await PET and proceed with SBRT simulation following her PET. She is in agreement.

## 2017-08-10 ENCOUNTER — Encounter: Payer: Self-pay | Admitting: Cardiology

## 2017-08-10 ENCOUNTER — Ambulatory Visit: Payer: PPO | Admitting: Cardiology

## 2017-08-10 ENCOUNTER — Ambulatory Visit (HOSPITAL_COMMUNITY): Payer: PPO

## 2017-08-10 VITALS — BP 150/100 | HR 101 | Ht 61.0 in | Wt 108.0 lb

## 2017-08-10 DIAGNOSIS — F17200 Nicotine dependence, unspecified, uncomplicated: Secondary | ICD-10-CM | POA: Diagnosis not present

## 2017-08-10 DIAGNOSIS — E43 Unspecified severe protein-calorie malnutrition: Secondary | ICD-10-CM

## 2017-08-10 DIAGNOSIS — R0602 Shortness of breath: Secondary | ICD-10-CM

## 2017-08-10 DIAGNOSIS — R000 Tachycardia, unspecified: Secondary | ICD-10-CM

## 2017-08-10 DIAGNOSIS — I420 Dilated cardiomyopathy: Secondary | ICD-10-CM

## 2017-08-10 DIAGNOSIS — R931 Abnormal findings on diagnostic imaging of heart and coronary circulation: Secondary | ICD-10-CM | POA: Insufficient documentation

## 2017-08-10 DIAGNOSIS — I714 Abdominal aortic aneurysm, without rupture, unspecified: Secondary | ICD-10-CM

## 2017-08-10 DIAGNOSIS — R0789 Other chest pain: Secondary | ICD-10-CM | POA: Diagnosis not present

## 2017-08-10 HISTORY — DX: Other chest pain: R07.89

## 2017-08-10 HISTORY — DX: Abnormal findings on diagnostic imaging of heart and coronary circulation: R93.1

## 2017-08-10 MED ORDER — NEBIVOLOL HCL 5 MG PO TABS: 5.0000 mg | ORAL_TABLET | Freq: Every day | ORAL | 0 refills | Status: DC

## 2017-08-10 MED ORDER — NEBIVOLOL HCL 5 MG PO TABS: 5.0000 mg | ORAL_TABLET | Freq: Every day | ORAL | 5 refills | Status: DC

## 2017-08-10 NOTE — Assessment & Plan Note (Addendum)
S/p EVAR -with atherosclerotic aortic disease, CAD is highly likely.  Would can reassess the last evaluation of AAA in the future, but stable for now.

## 2017-08-10 NOTE — Progress Notes (Signed)
PCP: [name], MD  Clinic Note: Chief Complaint  Patient presents with  . New Patient (Initial Visit)    Cardiomyopathy  . Shortness of Breath    HPI: Chelsea Baker is a 72 y.o. female with a PMH below who presents today for cardiology consultation.  She is being seen today at the request of [name], MD who saw her after recent hospitalization with diagnosis of right upper lobe malignant neoplasm.  She has a history of long-standing nicotine/tobacco dependence (history of 1  1/2 -2 PPD smoking for over 50 years) with COPD as well as hypertension, AAA and now recent diagnosis of right upper lobe squamous cell carcinoma of the lung (by recent biopsy January 17).  She now has a diagnosis of cardiomyopathy with ejection fraction of 35-40% by echocardiogram.  Ileana Roup was seen by Dr. [name] on January 29 -having been referred by Dr. Sloan Leiter ([facility]) -this was following a COPD exacerbation where chest CT showed 3 cm cavitary mass in the right upper lobe concerning for primary bronchogenic carcinoma. -->  Was discharged from the hospital on sublingual nitroglycerin.  Recent Hospitalizations:   January 14-20, 2018 --> 2-week history of cough, weight loss.  Chest CT showed concerning right upper lobe mass. -->  CT-guided biopsy on January 17 (squamous cell carcinoma of the lung).  2D echocardiogram checked showing significantly reduced EF.  Also diagnosed with SIADH (hyponatremia).  She returned to the emergency room on January 21 (the morning after discharge.  There is a do been some confusion about her discharge recommendations.  July 27, 2017: COPD exacerbation; shortness of breath but no chest pain or palpitations.  Simply weakness/fatigue and decreased appetite.  Studies Personally Reviewed - (if available, images/films reviewed: From Epic Chart or Care Everywhere)  Echocardiogram July 17, 2017: EF 35-40%.  Septal mid and basal inferior  wall hypokinesis.  Mildly dilated LV.  Mildly reduced EF.  GR 1 DD.  Calcified mitral annulus.    CT angiogram of chest January 2019: Atherosclerotic calcification of the aortic arch.  3.4 cm partially cavitary mass in the right upper lobe posterior segment.  Suspicious for primary bronchogenic neoplasm.  Myoview 2012: LV EF is 67%.  Normal Lexiscan Myoview with no ischemia and with normal LV systolic function.  Interval History: [name] presents here today for cardiology evaluation based on her EF being low.  She is in a wheelchair, essentially cachectic appearing in significant respiratory distress with what looks like hyperventilation episodes.  She can barely talk while gasping for breath.  She is essentially guppy breathing and not fully exhaling.  They makes the interview very difficult as well as examination. She and her daughter indicate that prior to her recent hospitalization she was doing fine.  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  She had no PND orthopnea.  However she then developed a "cold.  She was not feeling well so she went to the clinic to get a work note.  She was told that she need to go to the hospital when she went to the clinic, but when she did not do so she ended going back a week later and then was sent as an admission to the hospital.  During that hospitalization she then was diagnosed with lung cancer and she indicates  that now she has been extremely short of breath unable to catch her breath despite being on oxygen ever since. She now has frequent episodes of hyperventilation where she just cannot catch her breath she is struggling to breathe it happens a lot at nighttime when she is trying to sleep.  She feels that these may be panic attacks as suggested by her daughter.   She feels when this occurs, her heart rate goes fast she is breathing very fast she cannot catch her breath and cannot calm down.  She indicates that she had not not had a cigarette since the hospital stay, and does think that her coughing has improved.  She has not had any edema.  She sleeps with 2 pillows, but that is chronic.  No PND besides her panic attacks.  She does not currently have, nor has she had any symptoms of chest pain or pressure with rest or exertion.  She now gets short of breath just walking across the room.  She feels that her heart rate goes fast, but not irregular. When she gets very short of breath and starts hyperventilating, she does feel near syncopal, but has not had any syncope.  No TIA/amaurosis fugax symptoms. No melena, hematochezia, hematuria, or epstaxis. No claudication. She was started on a combination of carvedilol , Lisinopril and simvastatin while in the hospital.  Unfortunately, she stopped taking both the statin and the lisinopril because she felt fatigued and achy.  ROS: A comprehensive was performed. Review of Systems  Constitutional: Positive for malaise/fatigue and weight loss. Negative for chills and fever.  HENT: Negative for ear discharge.   Respiratory: Positive for cough (Although this seems to have improved), shortness of breath and wheezing.   Gastrointestinal: Positive for nausea and vomiting (Not as much is just simply nausea). Negative for blood in stool and melena.  Musculoskeletal: Negative for falls (Just very weak and has had near falls) and joint pain.  Neurological: Positive for dizziness (With hyperventilation) and weakness. Negative for focal weakness.  Psychiatric/Behavioral: The patient is nervous/anxious (Frequent panic attacks with hyperventilation) and has insomnia.   All other systems reviewed and are negative.  Epworth score is 11 indicating-: Slight chance of sleeping while sitting and reading or sitting quietly after lunch  (without alcohol).  High chance while watching TV, as passenger in the car for an hour without a break, lying down to rest in the afternoon. --Does feel excessively sleepy during the day.  Frequently wakes up with headaches and not feeling rested.  Has frequent panic attacks at night when she wakes up gasping for breath, but no true orthopnea or PND.  I have reviewed and (if needed) personally updated the patient's problem list, medications, allergies, past medical and surgical history, social and family history.   Past Medical History:  Diagnosis Date  . AAA (abdominal aortic aneurysm) ([place])   . Diverticulosis   . GERD (gastroesophageal reflux disease)   . Hiatal hernia   . Hypertension   . Hypokalemia   . Lumbar spondylolysis   . Lung cancer ([place])   . Lung tumor   . On home O2    2L N/C    Past Surgical History:  Procedure Laterality Date  . ABDOMINAL AORTIC ANEURYSM REPAIR  10/01/10   endostent repair  . CATARACT EXTRACTION, BILATERAL    . LUNG BIOPSY    . NM MYOVIEW LTD  2012   Normal Lexiscan Myoview with no ischemia or infarction.  Normal LV function.  EF  67%.  . TONSILLECTOMY    . TRANSTHORACIC ECHOCARDIOGRAM  07/17/2017   EF 35-40%.  Septal mid and basal inferior wall hypokinesis.  Mildly dilated LV.  Mildly reduced EF.  GR 1 DD.  Calcified mitral annulus.     Current Meds  Medication Sig  . acetaminophen (TYLENOL) 500 MG tablet Take 500-1,000 mg by mouth every 6 (six) hours as needed for mild pain, moderate pain or headache.  . albuterol (PROVENTIL HFA;VENTOLIN HFA) 108 (90 Base) MCG/ACT inhaler Inhale 2 puffs into the lungs every 4 (four) hours as needed for wheezing or shortness of breath.  . [DISCONTINUED] carvedilol (COREG) 3.125 MG tablet Take 1 tablet (3.125 mg total) by mouth 2 (two) times daily.  . [DISCONTINUED] ipratropium-albuterol (DUONEB) 0.5-2.5 (3) MG/3ML SOLN Take 3 mLs by nebulization every 4 (four) hours as needed. (Patient not taking: Reported on  08/12/2017)    No Known Allergies  Social History   Tobacco Use  . Smoking status: Former Smoker    Packs/day: 1.00    Years: 50.00    Pack years: 50.00    Types: Cigarettes    Last attempt to quit: 06/2017    Years since quitting: 0.1  . Smokeless tobacco: Never Used  Substance Use Topics  . Alcohol use: Yes    Comment: rarely  . Drug use: No   Social History   Social History Narrative  . Not on file    family history includes Coronary artery disease in her father and mother; Heart attack (age of onset: 54) in her father; Heart attack (age of onset: 43) in her mother; Heart disease in her maternal grandfather and maternal grandmother; Heart failure in her mother.  Wt Readings from Last 3 Encounters:  08/12/17 109 lb 9.6 oz (49.7 kg)  08/10/17 108 lb (49 kg)  08/03/17 111 lb (50.3 kg)    PHYSICAL EXAM BP (!) 150/100   Pulse (!) 101   Ht 5\' 1"  (1.549 m)   Wt 108 lb (49 kg)   BMI 20.41 kg/m  Physical Exam  Constitutional: She is oriented to person, place, and time. She appears distressed.  Essentially borderline cachectic woman who appears older than her stated age.  She is thin, frail.  She is in a wheelchair, too weak to walk.  HENT:  Head: Normocephalic and atraumatic.  Mouth/Throat: No oropharyngeal exudate.  Temporal wasting  Eyes: Conjunctivae and EOM are normal. Pupils are equal, round, and reactive to light. No scleral icterus (Arcus senilis).  Neck: JVD (Visible, but not grossly dilated) present. Carotid bruit is present (Cannot exclude bilateral bruit, however it may just simply be her breathing making it difficult here.).  Cardiovascular: Regular rhythm, S1 normal and S2 normal. Tachycardia present. PMI is not displaced (Is just hyperdynamic). Exam reveals distant heart sounds and decreased pulses (Bilateral pedal pulses are weak and quite thready.  Radial pulses are thready but palpable.). Exam reveals no gallop and no friction rub.  No murmur  heard. Pulmonary/Chest: She is in respiratory distress. She has wheezes. She exhibits tenderness (Along both sternal margins.).  Accessory muscle use for breathing.  Frequent episodes of gasping tachypneic breathing.  Clearly seems to be anxiety driven. Diffuse rhonchi, wheezing and crackles.  No obvious rales.  Abdominal: Soft. Bowel sounds are normal. She exhibits no distension. There is no tenderness. There is no rebound.  Musculoskeletal: Normal range of motion. She exhibits no edema.  Neurological: She is alert and oriented to person, place, and time. No cranial nerve deficit.  Skin: Skin is dry. No rash noted. There is pallor.  Psychiatric: She has a normal mood and affect. Her behavior is normal. Judgment and thought content normal.  Nursing note and vitals reviewed.    Adult ECG Report  Rate: 101;  Rhythm: sinus tachycardia and Likely LVH with repolarization changes.  Normal intervals, durations, voltage.;   Narrative Interpretation: No change from previous   Other studies Reviewed: Additional studies/ records that were reviewed today include:  Recent Labs: No recent lipids available Lab Results  Component Value Date   CREATININE 0.83 08/12/2017   BUN 12 08/12/2017   NA 135 (L) 08/12/2017   K 4.4 08/12/2017   CL 90 (L) 08/12/2017   CO2 36 (H) 08/12/2017    ASSESSMENT / PLAN: Problem List Items Addressed This Visit    AAA (abdominal aortic aneurysm) without rupture (HCC) - Primary (Chronic)    S/p EVAR -with atherosclerotic aortic disease, CAD is highly likely.  Would can reassess the last evaluation of AAA in the future, but stable for now.      Relevant Medications   nebivolol (BYSTOLIC) 5 MG tablet   Abnormal echocardiogram    Reduced ejection fraction with regional wall motion normality the patient with significant cardiac risk factors.  Need to exclude ischemic CAD, and need to exclude or confirm prior infarct.  Plan: Lexiscan Myoview.  She is not actively  wheezing, just has tachypneic panic attacks.      Chest discomfort    Chest discomfort she has is probably with coughing, but cannot exclude angina.  It is very difficult, because she is hypersensitive to symptoms at this point. Plan: We are working to exclude ischemic CAD with [employer].  This can determine if there is true anginal symptoms or simply musculoskeletal from chest wall pain.      Relevant Orders   EKG 12-Lead (Completed)   MYOCARDIAL PERFUSION IMAGING   Current every day smoker    She still smoking despite having just been diagnosed with lung cancer.  I did not go into this discussion today as it is clearly obvious she needs to stop      Dilated cardiomyopathy (HCC) (Chronic)    Reduced EF with regional wall motion abnormality on echo in a patient with significant family history for CAD, who is also a smoker with hypertension and hyperlipidemia.  Very high likelihood of having multivessel disease.  The echocardiogram would suggest significant RCA-RPDA disease.  We need to evaluate for ischemic CAD.  She is probably not a great candidate for CT angiogram likely because of the significant calcification making accurate assessment difficult.  I would like to see if there is evidence of prior infarct in the inferior wall which would be easier seen by my if abnormal, would probably need to consider potential timing for cardiac catheterization.  Plan: Lexiscan Myoview -exclude ischemia  will convert to beta 1 selective beta-blocker although carvedilol is a good option, with her significant respiratory issues I would like to beta-1 selectivity. -> Bystolic 5 mg   seems relatively euvolemic -hold off on diuretics (Lasix or spironolactone)   Once we see her responsiveness to beta-blocker, will then add ARB as opposed to ACE inhibitor to avoid pulmonary issues --probably would hold off on Entresto because of financial issues with her upcoming cancer treatments.      Relevant  Medications   nebivolol (BYSTOLIC) 5 MG tablet   Other Relevant Orders   MYOCARDIAL PERFUSION IMAGING   Protein-calorie malnutrition (HCC) (Chronic)  Not a great cardiac catheterization candidate, however if there is significant potential ischemic lesion, I think we would need to consider. She is trying to work on eating, but has been nauseated and not able to keep lots of foods down.  Poor appetite.      Shortness of breath (Chronic)    According to her and her daughter, she was doing fine until this last hospitalization.  I find that hard to believe based on the extent of lung disease that we now see in the EF.  Clearly with her lung disease she could be dyspneic, but EF of 35% is also quite likely culprit, I am just not sure of the chronicity of cardiomyopathy.  Evaluating for ischemic etiology.        Relevant Orders   EKG 12-Lead (Completed)   MYOCARDIAL PERFUSION IMAGING   Tachycardia    First-line treatment euvolemic his rate control.  Will use beta 1 selective beta-blocker (initial thought was bisoprolol, however this is a higher tier on her insurance then Bystolic.  We will therefore use Bystolic which is better for blood pressure anyway.      Relevant Orders   EKG 12-Lead (Completed)   MYOCARDIAL PERFUSION IMAGING      Current medicines are reviewed at length with the patient today. (+/- concerns) never started taking simvastatin, and stopped taking lisinopril. The following changes have been made:See below  Patient Instructions  MEDICATION INSTRUCTIONS  STOP CARVEDILOL    START BYSTOLIC 5 MG ONE TABLET DAILY   SCHEDULE [place]  Your physician has requested that you have a lexiscan myoview. For further information please visit [email or web address]. Please follow instruction sheet, as given.     Your physician recommends that you schedule a follow-up appointment in [place].    Studies Ordered:   Orders Placed This  Encounter  Procedures  . MYOCARDIAL PERFUSION IMAGING  . EKG 12-Lead      Glenetta Hew, M.D., M.S. Interventional Cardiologist   Pager # [phone] Phone # [phone] [address] [place], [place] [address]   Thank you for choosing [facility] at [facility]!!

## 2017-08-10 NOTE — Patient Instructions (Signed)
MEDICATION INSTRUCTIONS  STOP CARVEDILOL    START BYSTOLIC 5 MG ONE TABLET DAILY   SCHEDULE [place] [address]  Your physician has requested that you have a lexiscan myoview. For further information please visit [email or web address]. Please follow instruction sheet, as given.     Your physician recommends that you schedule a follow-up appointment in [place].

## 2017-08-11 ENCOUNTER — Ambulatory Visit (HOSPITAL_COMMUNITY): Payer: PPO

## 2017-08-11 ENCOUNTER — Ambulatory Visit (HOSPITAL_COMMUNITY)
Admission: RE | Admit: 2017-08-11 | Discharge: 2017-08-11 | Disposition: A | Discharge status: Home / Self Care | Payer: PPO | Source: Ambulatory Visit | Attending: Hematology | Admitting: Hematology

## 2017-08-11 DIAGNOSIS — C3490 Malignant neoplasm of unspecified part of unspecified bronchus or lung: Secondary | ICD-10-CM | POA: Diagnosis present

## 2017-08-11 DIAGNOSIS — C3411 Malignant neoplasm of upper lobe, right bronchus or lung: Secondary | ICD-10-CM | POA: Diagnosis not present

## 2017-08-11 LAB — GLUCOSE, CAPILLARY: Glucose-Capillary: 123 mg/dL — ABNORMAL HIGH (ref 65–99)

## 2017-08-11 MED ORDER — FLUDEOXYGLUCOSE F - 18 (FDG) INJECTION
5.3600 | Freq: Once | INTRAVENOUS | Status: AC | PRN
Start: 2017-08-11 — End: 2017-08-11
  Administered 2017-08-11: 5.36 via INTRAVENOUS

## 2017-08-11 NOTE — Progress Notes (Signed)
HEMATOLOGY/ONCOLOGY CONSULTATION NOTE  Date of Service: 08/12/2017  Patient Care Team: [name], MD as PCP - General (Hematology) Dannielle Karvonen, RN as [place] Management  CHIEF COMPLAINTS/PURPOSE OF CONSULTATION:   F/u for recently diagnosed squamous cell lung cancer   HISTORY OF PRESENTING ILLNESS:    Chelsea Baker is a wonderful 72 y.o. female who has been referred to [place] by Dr .Sloan Leiter, Henreitta Leber, MD  for evaluation and management of newly diagnosed Squmous cell carcinoma of the RUL.  Patient has a history of hypertension, nicotine dependence, GERD, hiatal hernia, AAA and presented with a 2-week history of cough, weight loss about 20 pounds in about 6 months and increasing shortness of breath over the last several days prior to admission.  In the ED the patient had a chest x-ray which showed a 3.4 cm Which showed a 3.4 cm right upper lobe mass which appeared cavitary.  Subsequent CT of the chest showed a 3.5 cm cavitary mass involving the right upper lobe concerning for a primary bronchogenic carcinoma.  She was also noted to have a 4 mm nodule which is indeterminate in the right lower lobe.  No evidence of metastatic disease noted in the chest or upper abdomen. No evidence of pulmonary embolism.  Patient subsequently had a CT-guided biopsy of the right upper lobe lung lesion on 07/15/2017.  Pathology revealed squamous cell carcinoma of the lung. She had other workup including an AFB smear which was negative.  Patient notes that she does not really have a primary care physician and has not had good medical follow-up and does not really see a doctor regularly. She has had a history of smoking 1/2-2 packs/day for 50 years.  No diagnosed COPD though likely has significant decline in lung function at baseline. Currently was short of breath despite lung cancer not involving a large part of the lung at this time and no evidence of PE.  Still  needing 2-3 L of oxygen by nasal cannula currently and endorses shortness of breath with minimal walking.  She notes that she lives with her son.   INTERVAL HISTORY   Chelsea Baker is here regarding her newly diagnosed lung cancer. The patient's last visit with [place] was on 07/27/17. The pt reports that she is doing well overall and has been feeling better since her last visit with [place]. She reports that she will be going to [facility] to see Dr. [place] Pink as a PCP on 08/25/17 who will continue to manage COPD and coordinate with her cardiologist.  She notes feeling frequently tired but notes this is most likely due to her heart conditions and COPD. She reports that her cardiologist has ordered a stress test and has taken her off of numerous meds that she had been taking except for keeping her on Coreg and Lisinopril.  She reports that she has quit smoking and has not used any patches or pills, and feels that she will never smoke again since being told she has lung cancer.  She reports using 1.5-2.0L of oxygen.  Of note since the patient last visit, the pt has had a PET Skull base to thigh on 08/11/17 revealing Hypermetabolic right upper lobe mass consistent with bronchogenic carcinoma. No definite signs of metastatic disease. Small indeterminate pulmonary nodules bilaterally with mild hypermetabolic activity on the left. Attention to these nodules on follow-up recommended. By this study, this disease is most consistent with stage 1B disease (T2aN0M0). The  pt has also had a Head CT completed on 08/04/17 with results revealing Negative unenhanced CT head.  Lab results today (08/12/17) of CBC, CMP is as follows: all values are WNL except for MCHC at 29.8, RDW at 14.8, Neutro Abs at 8.2k, Sodium at 135, Chloride at 90, CO2 at 36, Albumin at 3.4.  On review of systems, pt reports being tired, and denies any other symptoms.   MEDICAL HISTORY:  Past Medical History:  Diagnosis Date   AAA  (abdominal aortic aneurysm) (HCC)    Diverticulosis    GERD (gastroesophageal reflux disease)    Hiatal hernia    Hypertension    Hypokalemia    Lumbar spondylolysis    Lung cancer ([place])    Lung tumor    On home O2    2L N/C    SURGICAL HISTORY: Past Surgical History:  Procedure Laterality Date   ABDOMINAL AORTIC ANEURYSM REPAIR  10/01/10   endostent repair   CATARACT EXTRACTION, BILATERAL     LUNG BIOPSY     TONSILLECTOMY      SOCIAL HISTORY: Social History   Socioeconomic History   Marital status: Single    Spouse name: Not on file   Number of children: Not on file   Years of education: Not on file   Highest education level: Not on file  Social Needs   Financial resource strain: Not on file   Food insecurity - worry: Not on file   Food insecurity - inability: Not on file   Transportation needs - medical: Not on file   Transportation needs - non-medical: Not on file  Occupational History   Not on file  Tobacco Use   Smoking status: Former Smoker    Packs/day: 1.00    Years: 50.00    Pack years: 50.00    Types: Cigarettes    Last attempt to quit: 06/2017    Years since quitting: 0.1   Smokeless tobacco: Never Used  Substance and Sexual Activity   Alcohol use: Yes    Comment: rarely   Drug use: No   Sexual activity: Not Currently  Other Topics Concern   Not on file  Social History Narrative   Not on file    FAMILY HISTORY: Family History  Problem Relation Age of Onset   Heart disease Mother    Heart failure Mother    Heart disease Father    Coronary artery disease Father    Cancer Neg Hx     ALLERGIES:  has No Known Allergies.  MEDICATIONS:  Current Outpatient Medications  Medication Sig Dispense Refill   acetaminophen (TYLENOL) 500 MG tablet Take 500-1,000 mg by mouth every 6 (six) hours as needed for mild pain, moderate pain or headache.     albuterol (PROVENTIL HFA;VENTOLIN HFA) 108 (90 Base) MCG/ACT  inhaler Inhale 2 puffs into the lungs every 4 (four) hours as needed for wheezing or shortness of breath. 1 Inhaler 0   nebivolol (BYSTOLIC) 5 MG tablet Take 1 tablet (5 mg total) by mouth daily. 30 tablet 5   No current facility-administered medications for this visit.     REVIEW OF SYSTEMS:    .10 Point review of Systems was done is negative except as noted above.  PHYSICAL EXAMINATION: ECOG PERFORMANCE STATUS: 3 - Symptomatic, >50% confined to bed  . Vitals:   08/12/17 0835  BP: (!) 150/77  Pulse: (!) 115  Resp: 16  Temp: 98 F (36.7 C)  SpO2: 95%   Filed Weights  08/12/17 0835  Weight: 109 lb 9.6 oz (49.7 kg)   .Body mass index is 20.71 kg/m. NAD On [place] 2L/min O2 GENERAL:alert, in no acute distress and comfortable SKIN: no acute rashes, no significant lesions EYES: conjunctiva are pink and non-injected, sclera anicteric OROPHARYNX: MMM, no exudates, no oropharyngeal erythema or ulceration NECK: supple, no JVD LYMPH:  no palpable lymphadenopathy in the cervical, axillary or inguinal regions LUNGS: b/l decreased air entry and scattered rhonci HEART: regular rate & rhythm ABDOMEN:  normoactive bowel sounds , non tender, not distended. Extremity: no pedal edema PSYCH: alert & oriented x 3 with fluent speech NEURO: no focal motor/sensory deficits   LABORATORY DATA:  I have reviewed the data as listed  . CBC Latest Ref Rng & Units 08/12/2017 07/27/2017 07/19/2017  WBC 3.9 - 10.3 K/uL 10.3 10.3 -  Hemoglobin 12.0 - 15.0 g/dL - 13.5 13.6  Hematocrit 34.8 - 46.6 % 42.3 42.6 40.0  Platelets 145 - 400 K/uL 354 436(H) -    . CMP Latest Ref Rng & Units 08/12/2017 07/27/2017 07/19/2017  Glucose 70 - 140 mg/dL 140 123(H) 149(H)  BUN 7 - 26 mg/dL 12 11 6   Creatinine 0.60 - 1.10 mg/dL 0.83 0.64 0.80  Sodium 136 - 145 mmol/L 135(L) 131(L) 136  Potassium 3.5 - 5.1 mmol/L 4.4 3.9 3.3(L)  Chloride 98 - 109 mmol/L 90(L) 89(L) 91(L)  CO2 22 - 29 mmol/L 36(H) 32 -  Calcium 8.4  - 10.4 mg/dL 10.2 9.7 -  Total Protein 6.4 - 8.3 g/dL 7.2 7.7 -  Total Bilirubin 0.2 - 1.2 mg/dL 0.4 0.2(L) -  Alkaline Phos 40 - 150 U/L 95 87 -  AST 5 - 34 U/L 19 20 -  ALT 0 - 55 U/L 12 15 -    PATHOLOGY       RADIOGRAPHIC STUDIES: I have personally reviewed the radiological images as listed and agreed with the findings in the report. Dg Chest 2 View  Result Date: 07/27/2017 CLINICAL DATA:  Worsening short of breath.  History of lung cancer. EXAM: CHEST  2 VIEW COMPARISON:  07/19/2017 FINDINGS: The lungs are hyperinflated likely secondary to COPD. There is a 3.8 cm right upper lobe pulmonary mass. There is no pleural effusion or pneumothorax. The heart and mediastinal contours are unremarkable. There is thoracic aortic atherosclerosis. The osseous structures are unremarkable. IMPRESSION: 1. COPD. 2. No acute cardiopulmonary disease. 3. 3.8 cm right upper lobe mass most consistent with lung cancer. Electronically Signed   By: Kathreen Devoid   On: 07/27/2017 08:17   Dg Chest 2 View  Result Date: 07/19/2017 CLINICAL DATA:  Shortness of breath, wheezing, and chest congestion this evening. Previously diagnosed with right upper lobe lung mass. EXAM: CHEST  2 VIEW COMPARISON:  07/15/2017 FINDINGS: Normal heart size and pulmonary vascularity. Known right suprahilar mass lesion is obscured by overlying EKG lead and patch. The mass appears to be in the posterior right upper lung on the lateral view. No developing consolidation or airspace disease. No blunting of costophrenic angles. No pneumothorax. Calcification of the aorta. Degenerative changes in the spine. IMPRESSION: Known right upper lung mass is obscured by the EKG lead and patch. On the lateral view, the mass appears to be in the posterior region. No developing consolidation or pneumothorax. Electronically Signed   By: Lucienne Capers M.D.   On: 07/19/2017 02:22   Ct Head Wo Contrast  Result Date: 08/04/2017 CLINICAL DATA:  Lung cancer  staging. EXAM: CT HEAD WITHOUT CONTRAST  TECHNIQUE: Contiguous axial images were obtained from the base of the skull through the vertex without intravenous contrast. COMPARISON:  None. FINDINGS: Brain: No evidence of acute infarction, hemorrhage, hydrocephalus, extra-axial collection or mass lesion/mass effect. Vascular: Negative for vascular thrombosis Skull: Negative Sinuses/Orbits: Negative Other: None IMPRESSION: Negative unenhanced CT head. MRI brain without with contrast is the preferred test for screening for metastatic disease. CT head without and with contrast is less sensitive than MRI. CT head without contrast is suboptimal for evaluation of metastatic disease and small lesions could be missed. Electronically Signed   By: Franchot Gallo M.D.   On: 08/04/2017 14:07   Ct Angio Chest Pe W Or Wo Contrast  Result Date: 07/13/2017 CLINICAL DATA:  Shortness of breath, evaluate for PE. Cavitary right upper lobe mass. EXAM: CT ANGIOGRAPHY CHEST WITH CONTRAST TECHNIQUE: Multidetector CT imaging of the chest was performed using the standard protocol during bolus administration of intravenous contrast. Multiplanar CT image reconstructions and MIPs were obtained to evaluate the vascular anatomy. CONTRAST:  54mL ISOVUE-370 IOPAMIDOL (ISOVUE-370) INJECTION 76% COMPARISON:  CT chest dated 07/12/2017. FINDINGS: Cardiovascular: Satisfactory opacification of the bilateral pulmonary artery to the segmental level. No evidence of pulmonary embolism. No evidence of thoracic aortic aneurysm. Atherosclerotic calcifications of the aortic arch. The heart is normal in size.  No pericardial effusion. Otherwise, unchanged from recent CT yesterday. Mediastinum/Nodes: Small mediastinal lymph nodes measuring up to 7 mm short axis. No suspicious hilar or axillary lymphadenopathy. Visualized thyroid is unremarkable. Lungs/Pleura: 3.4 x 3.3 cm partially cavitary mass in the posterior right upper lobe (series 10/image 36), suspicious for  primary bronchogenic neoplasm. Underlying mild centrilobular emphysematous changes. 4 mm satellite nodule in the superior segment right lower lobe (series 10/image 61). Additional mild nodularity anteriorly in the left upper lobe (series 10/image 69), possibly infectious. No pleural effusion or pneumothorax. Upper Abdomen: Visualized upper abdomen is notable for an incompletely visualized abdominal aortic stent. Musculoskeletal: Mild degenerative changes of the visualized thoracolumbar spine. Review of the MIP images confirms the above findings. IMPRESSION: No evidence of pulmonary embolism. Otherwise unchanged from recent CT chest. 3.4 cm partially cavitary mass in the posterior right upper lobe, suspicious for primary bronchogenic neoplasm. Aortic Atherosclerosis (ICD10-I70.0) and Emphysema (ICD10-J43.9). Electronically Signed   By: Julian Hy M.D.   On: 07/13/2017 10:53   Ct Abdomen Pelvis W Contrast  Result Date: 07/17/2017 CLINICAL DATA:  Lung cancer evaluation. Evaluate for metastatic disease EXAM: CT ABDOMEN AND PELVIS WITH CONTRAST TECHNIQUE: Multidetector CT imaging of the abdomen and pelvis was performed using the standard protocol following bolus administration of intravenous contrast. CONTRAST:  52mL ISOVUE-300 IOPAMIDOL (ISOVUE-300) INJECTION 61%, 123mL ISOVUE-300 IOPAMIDOL (ISOVUE-300) INJECTION 61% COMPARISON:  CT 07/13/2017 FINDINGS: Lower chest: Lung bases are clear. Hepatobiliary: No focal hepatic lesion. No biliary duct dilatation. Gallbladder is normal. Common bile duct is normal. Pancreas: Pancreas is normal. No ductal dilatation. No pancreatic inflammation. Spleen: Normal spleen Adrenals/urinary tract: Adrenal glands and kidneys are normal. The ureters and bladder normal. Stomach/Bowel: Stomach, small bowel, appendix, and cecum are normal. The colon and rectosigmoid colon are normal. Vascular/Lymphatic: Abdominal aorta is normal caliber. Aortic stent graft noted. There is no  retroperitoneal or periportal lymphadenopathy. No pelvic lymphadenopathy. Reproductive: Post hysterectomy Other: No peritoneal disease Musculoskeletal: No aggressive osseous lesion. IMPRESSION: No evidence of metastatic disease in the abdomen pelvis. Electronically Signed   By: Suzy Bouchard M.D.   On: 07/17/2017 16:14   Nm Pet Image Initial (pi) Skull Base To Thigh  Result  Date: 08/11/2017 CLINICAL DATA:  Initial treatment strategy for non-small cell right upper lobe lung cancer. EXAM: NUCLEAR MEDICINE PET SKULL BASE TO THIGH TECHNIQUE: 5.36 mCi F-18 FDG was injected intravenously. Full-ring PET imaging was performed from the skull base to thigh after the radiotracer. CT data was obtained and used for attenuation correction and anatomic localization. FASTING BLOOD GLUCOSE:  Value: 123 mg/dl COMPARISON:  Chest CT 07/13/2017.  Abdominopelvic CT 07/17/2017. FINDINGS: NECK No hypermetabolic cervical lymph nodes are identified.There are no lesions of the pharyngeal mucosal space. Physiologic muscular activity noted bilaterally. CHEST There are no hypermetabolic mediastinal, hilar or axillary lymph nodes. The dominant, cavitary right upper lobe lesion is markedly hypermetabolic with an SUV max of 20.9. This measures 3.7 x 2.9 cm on image 16, similar to recent CTs. No other highly suspicious pulmonary activity. There is ill-defined metabolic activity in the lingula (SUV max 1.8), corresponding with an ill-defined 5 mm nodule on image 39. Tiny right lower lobe nodule on image 26 is stable. Atherosclerosis of the aorta, great vessels and coronary arteries again noted. ABDOMEN/PELVIS There is no hypermetabolic activity within the liver, adrenal glands, spleen or pancreas. There is no hypermetabolic nodal activity. Status post aorto bi-iliac stent grafting. SKELETON There is no hypermetabolic activity to suggest osseous metastatic disease. Cervicothoracic kyphosis. IMPRESSION: Hypermetabolic right upper lobe mass  consistent with bronchogenic carcinoma. No definite signs of metastatic disease. Small indeterminate pulmonary nodules bilaterally with mild hypermetabolic activity on the left. Attention to these nodules on follow-up recommended. By this study, this disease is most consistent with stage 1B disease (T2aN0M0). Electronically Signed   By: Richardean Sale M.D.   On: 08/11/2017 11:04   Ct Biopsy  Result Date: 07/15/2017 INDICATION: Right upper lobe lung mass EXAM: CT BIOPSY MEDICATIONS: None. ANESTHESIA/SEDATION: Fentanyl 50 mcg IV; Versed 1 mg IV Moderate Sedation Time:  11 minutes The patient was continuously monitored during the procedure by the interventional radiology nurse under my direct supervision. FLUOROSCOPY TIME:  Fluoroscopy Time:  minutes  seconds ( mGy). COMPLICATIONS: None immediate. PROCEDURE: Informed written consent was obtained from the patient after a thorough discussion of the procedural risks, benefits and alternatives. All questions were addressed. Maximal Sterile Barrier Technique was utilized including caps, mask, sterile gowns, sterile gloves, sterile drape, hand hygiene and skin antiseptic. A timeout was performed prior to the initiation of the procedure. Under CT guidance, a 17 gauge needle was inserted into the left upper lobe lung mass. Three cores were obtained. Two were placed in formalin. One was placed in saline for tissue culture and AFB analysis. Biosintry plug was deployed and the guide needle was removed. Post biopsy images demonstrate no pneumothorax. Patient tolerated the procedure well without complication. Vital sign monitoring by nursing staff during the procedure will continue as patient is in the special procedures unit for post procedure observation. FINDINGS: The images document guide needle placement within the right upper lobe lung mass. Post biopsy images demonstrate no hemorrhage. IMPRESSION: Successful CT-guided right upper lobe lung mass core biopsy.  Electronically Signed   By: Marybelle Killings M.D.   On: 07/15/2017 11:26   Dg Chest Port 1 View  Result Date: 07/15/2017 CLINICAL DATA:  Status post lung biopsy EXAM: PORTABLE CHEST 1 VIEW COMPARISON:  Chest x-ray of July 12, 2017 FINDINGS: The soft tissue mass in the posterior aspect of the right upper lobe is again demonstrated. There is no post biopsy pneumothorax or hemothorax. The left lung is well-expanded and clear. The heart and pulmonary  vascularity are normal. The mediastinum is normal in width. There is mild multilevel degenerative disc disease of the thoracic spine. IMPRESSION: No postprocedure complication following percutaneous biopsy of the right upper lobe mass. Electronically Signed   By: David  Martinique M.D.   On: 07/15/2017 10:01    ASSESSMENT & PLAN:  Chelsea Baker is a 72 y.o. female with    1) Newly diagnosed right upper lobe squamous cell carcinoma of the lung. Stage I Presenting with a cavitary RUL mass.  Biopsy proven to be squamous cell carcinoma. No overt mediastinal or hilar lymphadenopathy noted. PET/CT showed no overt metastatic disease CT head neg for mets. Patient declined MRI PLAN -discussed with patient her PET/CT, pathology and CT head findings consistent with early stage Squamous cell carcinoma of the lung -patient poor candidate for surgery and is currently O2 dependent 2L/min [place] -Good candidate for radiation therapy, radiation oncology - shall be proceeding with SBRT- most likely 5 fractions of radiation:  - Discussed that following this, the pt can likely expect a CT in another 8-12 weeks.  -We would like to see her again in 4 months -Dr. Ellyn Hack is her cardiologist and is optimizing her heart conditions -Not going to do chemotherapy at this point, but we will remain open to being consulted throughout radiation therapy as needed.  2) Dyspnea and rest and on minimal exertion. CT chest with no PE Likely significant COPD at baseline -  Needing 2-3 L  of oxygen by nasal cannula in hospital. and is currently using 2L/min [place] at home ECHO ef 35-40% PLAN  -Pulmonary function testing -severe obstruction -noted to have systolic cardiomyopathy - ischemic issues being ruled out per Dr Ellyn Hack. -ECHO noted  Decreased PT - ECHO - will need to see early optimize mx -optimize mx of COPD with PCP/pulmonologist.  3) Poor medical f/u. Past Medical History:  Diagnosis Date   AAA (abdominal aortic aneurysm) (HCC)    Diverticulosis    GERD (gastroesophageal reflux disease)    Hiatal hernia    Hypertension    Hypokalemia    Lumbar spondylolysis    Lung cancer ([place])    Lung tumor    On home O2    2L N/C   4) Protein calorie malnutrition -nutritional consultation  5) Heavy smoker 75-100 pk-yrs -Discussed smoking cessation-in details  -Pt reports today 08/12/17 that she has completely quit smoking and plans to never smoke again.   PLAN -Pt has obtained a PCP as of 08/12/17 and will establish care on 08/25/17 with [facility].    F/u with Radiation oncology for SBRT. Will defer rpt imaging post SBRT in 10-12 weeks to Radiation oncology F/u with PCP for other medical issues RTC with Dr [name] in 4 months with labs   All of the patients questions were answered with apparent satisfaction. The patient knows to call the clinic with any problems, questions or concerns.  . The total time spent in the appointment was 25 minutes and more than 50% was on counseling and direct patient cares.     Sullivan Lone MD [place] AAHIVMS [facility] Hematology/Oncology Physician [facility]  (Office):       [phone] (Work cell):  [phone] (Fax):           [phone]  This document serves as a record of services personally performed by Sullivan Lone, MD. It was created on his behalf by Baldwin Jamaica, a trained medical scribe. The creation of this record is based on  the scribe's personal observations and the provider's statements  to them.   .I have reviewed the above documentation for accuracy and completeness, and I agree with the above. [name] MD MS

## 2017-08-12 ENCOUNTER — Encounter: Payer: Self-pay | Admitting: Cardiology

## 2017-08-12 ENCOUNTER — Inpatient Hospital Stay: Payer: PPO | Attending: Hematology | Admitting: Hematology

## 2017-08-12 ENCOUNTER — Encounter: Payer: PPO | Admitting: Nutrition

## 2017-08-12 ENCOUNTER — Ambulatory Visit: Payer: Self-pay

## 2017-08-12 ENCOUNTER — Inpatient Hospital Stay: Payer: Self-pay | Admitting: Hematology

## 2017-08-12 ENCOUNTER — Telehealth: Payer: Self-pay

## 2017-08-12 ENCOUNTER — Inpatient Hospital Stay: Payer: PPO

## 2017-08-12 VITALS — BP 150/77 | HR 115 | Temp 98.0°F | Resp 16 | Ht 61.0 in | Wt 109.6 lb

## 2017-08-12 DIAGNOSIS — Q255 Atresia of pulmonary artery: Secondary | ICD-10-CM | POA: Diagnosis not present

## 2017-08-12 DIAGNOSIS — C3411 Malignant neoplasm of upper lobe, right bronchus or lung: Secondary | ICD-10-CM

## 2017-08-12 DIAGNOSIS — Z9981 Dependence on supplemental oxygen: Secondary | ICD-10-CM | POA: Diagnosis not present

## 2017-08-12 DIAGNOSIS — E876 Hypokalemia: Secondary | ICD-10-CM | POA: Insufficient documentation

## 2017-08-12 DIAGNOSIS — I10 Essential (primary) hypertension: Secondary | ICD-10-CM

## 2017-08-12 DIAGNOSIS — E46 Unspecified protein-calorie malnutrition: Secondary | ICD-10-CM | POA: Diagnosis not present

## 2017-08-12 DIAGNOSIS — R0600 Dyspnea, unspecified: Secondary | ICD-10-CM

## 2017-08-12 DIAGNOSIS — E43 Unspecified severe protein-calorie malnutrition: Secondary | ICD-10-CM

## 2017-08-12 LAB — CMP (CANCER CENTER ONLY)
ALBUMIN: 3.4 g/dL — AB (ref 3.5–5.0)
ALT: 12 U/L (ref 0–55)
AST: 19 U/L (ref 5–34)
Alkaline Phosphatase: 95 U/L (ref 40–150)
Anion gap: 9 (ref 3–11)
BUN: 12 mg/dL (ref 7–26)
CHLORIDE: 90 mmol/L — AB (ref 98–109)
CO2: 36 mmol/L — AB (ref 22–29)
CREATININE: 0.83 mg/dL (ref 0.60–1.10)
Calcium: 10.2 mg/dL (ref 8.4–10.4)
GFR, Est AFR Am: >60 mL/min (ref >60)
GFR, Estimated: >60 mL/min (ref >60)
GLUCOSE: 140 mg/dL (ref 70–140)
POTASSIUM: 4.4 mmol/L (ref 3.5–5.1)
Sodium: 135 mmol/L — ABNORMAL LOW (ref 136–145)
Total Bilirubin: 0.4 mg/dL (ref 0.2–1.2)
Total Protein: 7.2 g/dL (ref 6.4–8.3)

## 2017-08-12 LAB — CBC WITH DIFFERENTIAL (CANCER CENTER ONLY)
BASOS ABS: 0 10*3/uL (ref 0.0–0.1)
Basophils Relative: 0 %
EOS PCT: 5 %
Eosinophils Absolute: 0.5 10*3/uL (ref 0.0–0.5)
HEMATOCRIT: 42.3 % (ref 34.8–46.6)
Hemoglobin: 12.6 g/dL (ref 11.6–15.9)
LYMPHS ABS: 1 10*3/uL (ref 0.9–3.3)
Lymphocytes Relative: 10 %
MCH: 26.7 pg (ref 25.1–34.0)
MCHC: 29.8 g/dL — AB (ref 31.5–36.0)
MCV: 89.6 fL (ref 79.5–101.0)
MONO ABS: 0.6 10*3/uL (ref 0.1–0.9)
MONOS PCT: 6 %
NEUTROS ABS: 8.2 10*3/uL — AB (ref 1.5–6.5)
Neutrophils Relative %: 79 %
Platelet Count: 354 10*3/uL (ref 145–400)
RBC: 4.72 MIL/uL (ref 3.70–5.45)
RDW: 14.8 % — AB (ref 11.2–14.5)
WBC Count: 10.3 10*3/uL (ref 3.9–10.3)

## 2017-08-12 LAB — BRAIN NATRIURETIC PEPTIDE: B Natriuretic Peptide: 58.1 pg/mL (ref 0.0–100.0)

## 2017-08-12 NOTE — Telephone Encounter (Signed)
Printed patient avs and calender for upcoming appointments. Resscheduled her NUT for 2/25 patient was tired. Per 2/14 los

## 2017-08-12 NOTE — Assessment & Plan Note (Signed)
Reduced EF with regional wall motion abnormality on echo in a patient with significant family history for CAD, who is also a smoker with hypertension and hyperlipidemia.  Very high likelihood of having multivessel disease.  The echocardiogram would suggest significant RCA-RPDA disease.  We need to evaluate for ischemic CAD.  She is probably not a great candidate for CT angiogram likely because of the significant calcification making accurate assessment difficult.  I would like to see if there is evidence of prior infarct in the inferior wall which would be easier seen by my if abnormal, would probably need to consider potential timing for cardiac catheterization.  Plan: Lexiscan Myoview -exclude ischemia  will convert to beta 1 selective beta-blocker although carvedilol is a good option, with her significant respiratory issues I would like to beta-1 selectivity. -> Bystolic 5 mg   seems relatively euvolemic -hold off on diuretics (Lasix or spironolactone)   Once we see her responsiveness to beta-blocker, will then add ARB as opposed to ACE inhibitor to avoid pulmonary issues --probably would hold off on Entresto because of financial issues with her upcoming cancer treatments.

## 2017-08-13 ENCOUNTER — Other Ambulatory Visit: Payer: Self-pay

## 2017-08-13 ENCOUNTER — Telehealth (HOSPITAL_COMMUNITY): Payer: Self-pay

## 2017-08-13 NOTE — Assessment & Plan Note (Signed)
Not a great cardiac catheterization candidate, however if there is significant potential ischemic lesion, I think we would need to consider. She is trying to work on eating, but has been nauseated and not able to keep lots of foods down.  Poor appetite.

## 2017-08-13 NOTE — Assessment & Plan Note (Signed)
She still smoking despite having just been diagnosed with lung cancer.  I did not go into this discussion today as it is clearly obvious she needs to stop

## 2017-08-13 NOTE — Telephone Encounter (Signed)
Encounter complete. 

## 2017-08-13 NOTE — Patient Outreach (Signed)
[facility]) Care Management  08/13/2017  Chelsea Baker [birth year] [id number]   Transition of care  Referral date: 08/03/17 Referral source: [facility] utilization management referral Insurance: Health team advantage Attempt #1  Telephone call to patient regarding transition of care follow up . Unable to reach patient. HIPAA compliant voice message left with call back phone number.  PLAN; RNCM will attempt 2nd telephone call to patient within 3 business days.   Quinn Plowman RN,BSN,CCM [facility] Telephonic  [phone]        .

## 2017-08-13 NOTE — Assessment & Plan Note (Signed)
Chest discomfort she has is probably with coughing, but cannot exclude angina.  It is very difficult, because she is hypersensitive to symptoms at this point. Plan: We are working to exclude ischemic CAD with [employer].  This can determine if there is true anginal symptoms or simply musculoskeletal from chest wall pain.

## 2017-08-13 NOTE — Assessment & Plan Note (Signed)
First-line treatment euvolemic his rate control.  Will use beta 1 selective beta-blocker (initial thought was bisoprolol, however this is a higher tier on her insurance then Bystolic.  We will therefore use Bystolic which is better for blood pressure anyway.

## 2017-08-13 NOTE — Assessment & Plan Note (Signed)
Reduced ejection fraction with regional wall motion normality the patient with significant cardiac risk factors.  Need to exclude ischemic CAD, and need to exclude or confirm prior infarct.  Plan: Lexiscan Myoview.  She is not actively wheezing, just has tachypneic panic attacks.

## 2017-08-13 NOTE — Assessment & Plan Note (Signed)
According to her and her daughter, she was doing fine until this last hospitalization.  I find that hard to believe based on the extent of lung disease that we now see in the EF.  Clearly with her lung disease she could be dyspneic, but EF of 35% is also quite likely culprit, I am just not sure of the chronicity of cardiomyopathy.  Evaluating for ischemic etiology.

## 2017-08-16 ENCOUNTER — Other Ambulatory Visit: Payer: Self-pay

## 2017-08-16 ENCOUNTER — Telehealth: Payer: Self-pay | Admitting: Radiation Oncology

## 2017-08-16 NOTE — Telephone Encounter (Addendum)
LM for pt to review her PET and asked her to call us back.

## 2017-08-16 NOTE — Patient Outreach (Addendum)
[facility]) Care Management  08/16/2017  Chelsea Baker [birth year] [id number]   Transition of care  Referral date:08/03/17 Referral source:[facility] utilization management referral Insurance:Health team advantage Attempt #2  Second telephone call to patient regarding transition of care follow up . Unable to reach patient. HIPAA compliant voice message left with call back phone number.  PLAN; RNCM will send patient outreach letter to attempt contact.  Quinn Plowman RN,BSN,CCM [facility] Telephonic  [phone]

## 2017-08-18 ENCOUNTER — Ambulatory Visit (HOSPITAL_COMMUNITY)
Admission: RE | Admit: 2017-08-18 | Payer: PPO | Source: Ambulatory Visit | Attending: Cardiology | Admitting: Cardiology

## 2017-08-18 ENCOUNTER — Telehealth: Payer: Self-pay | Admitting: Hematology

## 2017-08-18 NOTE — Telephone Encounter (Signed)
Patient called to cancel 2/25

## 2017-08-19 ENCOUNTER — Telehealth: Payer: Self-pay | Admitting: Radiation Oncology

## 2017-08-19 NOTE — Telephone Encounter (Signed)
I spoke with the patient about her PET scan and plans to move forward with radiation treatment. I sent a message to simulation staff to coordinate simulation and treatment.

## 2017-08-23 ENCOUNTER — Encounter: Payer: PPO | Admitting: Nutrition

## 2017-08-24 ENCOUNTER — Ambulatory Visit
Admission: RE | Admit: 2017-08-24 | Discharge: 2017-08-24 | Disposition: A | Discharge status: Home / Self Care | Payer: PPO | Source: Ambulatory Visit | Attending: Radiation Oncology | Admitting: Radiation Oncology

## 2017-08-24 ENCOUNTER — Telehealth (HOSPITAL_COMMUNITY): Payer: Self-pay

## 2017-08-24 DIAGNOSIS — Z510 Encounter for antineoplastic radiation therapy: Secondary | ICD-10-CM | POA: Diagnosis not present

## 2017-08-24 DIAGNOSIS — C3411 Malignant neoplasm of upper lobe, right bronchus or lung: Secondary | ICD-10-CM

## 2017-08-24 DIAGNOSIS — Z87891 Personal history of nicotine dependence: Secondary | ICD-10-CM | POA: Diagnosis not present

## 2017-08-24 NOTE — Progress Notes (Signed)
Subjective:    Patient ID: Chelsea Baker, female    DOB: 11-15-1945, 72 y.o.   MRN: [id number]  HPI:  Chelsea Baker presents to establish as a new pt.  Chelsea Baker is a pleasant 72 year old female.  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 1  1/2 -2 PPD smoking for >50 years, and has not used tobacco >5 weeks. Jan 2019 Hospitalization- Presented to ED with 2 week hx of cough/wt loss/increasing dyspnea. CXR in ED revealed 3.4 cm RUL mass Subsequent CT showed 3.5 cm RUL mass concerning for primary bronchogenic carcinoma and 4 mm nodule in RLL- indeterminate. No evidence of metastasis. Bx + squamous cel carcinoma   Chelsea Baker is followed by Dr. Romie Jumper- Chelsea Baker will start radiation therapy next week.  Chelsea Baker is followed by Dr. Melynda Ripple for systolic CHF  Chelsea Baker has not had regular healthcare in years. 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 Chelsea Baker denies ETOH use Chelsea Baker reports drinking water all day and that her appetite is slowly increasing. Chelsea Baker reports extreme fatigue and insomnia. Chelsea Baker at [facility] during [place]   Patient Care Team    Relationship Specialty Notifications Start End  Mina Marble D, NP PCP - General Family Medicine  08/25/17   Dannielle Karvonen, [place] Management  Admissions 08/02/17     Patient Active Problem List   Diagnosis Date Noted  . Hypertension 08/25/2017  . Chest discomfort 08/10/2017  . Abnormal echocardiogram 08/10/2017  . Hypoxia   . Dilated cardiomyopathy ([place]) 07/17/2017  . Shortness of breath   . Current every day smoker   . Protein-calorie malnutrition ([place])   . Lung cancer ([place]) 07/12/2017  . Tachycardia 07/12/2017  . Hyponatremia 07/12/2017  . Cavitating mass in right upper lung lobe 07/12/2017  . AAA (abdominal aortic aneurysm) without rupture ([place]) 04/10/2014   . Aftercare following surgery of the circulatory system 04/10/2014  . Aftercare following surgery of the circulatory system, [place] 11/15/2012     Past Medical History:  Diagnosis Date  . AAA (abdominal aortic aneurysm) ([place])   . Diverticulosis   . GERD (gastroesophageal reflux disease)   . Hiatal hernia   . Hypertension   . Hypokalemia   . Lumbar spondylolysis   . Lung cancer ([place])   . Lung tumor   . On home O2    2L N/C     Past Surgical History:  Procedure Laterality Date  . ABDOMINAL AORTIC ANEURYSM REPAIR  10/01/10   endostent repair  . CATARACT EXTRACTION, BILATERAL    . LUNG BIOPSY    . NM MYOVIEW LTD  2012   Normal Lexiscan Myoview with no ischemia or infarction.  Normal LV function.  EF 67%.  . TONSILLECTOMY    . TRANSTHORACIC ECHOCARDIOGRAM  07/17/2017   EF 35-40%.  Septal mid and basal inferior wall hypokinesis.  Mildly dilated LV.  Mildly reduced EF.  GR 1 DD.  Calcified mitral annulus.      Family History  Problem Relation Age of Onset  . Heart failure Mother   . Heart attack Mother 3       In 75s - 1st MI  . Coronary artery disease Mother   . Coronary artery disease Father        Died at age 22  . Heart attack Father 59  . Heart disease Maternal Grandmother   .  Heart disease Maternal Grandfather   . Cancer Neg Hx      Social History   Substance and Sexual Activity  Drug Use No     Social History   Substance and Sexual Activity  Alcohol Use Yes   Comment: rarely     Social History   Tobacco Use  Smoking Status Former Smoker  . Packs/day: 1.00  . Years: 50.00  . Pack years: 50.00  . Types: Cigarettes  . Last attempt to quit: 06/2017  . Years since quitting: 0.1  Smokeless Tobacco Never Used     Outpatient Encounter Medications as of 08/25/2017  Medication Sig  . acetaminophen (TYLENOL) 500 MG tablet Take 500-1,000 mg by mouth every 6 (six) hours as needed for mild pain, moderate pain or headache.  . albuterol (PROVENTIL  HFA;VENTOLIN HFA) 108 (90 Base) MCG/ACT inhaler Inhale 2 puffs into the lungs every 4 (four) hours as needed for wheezing or shortness of breath.  Marland Kitchen ipratropium-albuterol (DUONEB) 0.5-2.5 (3) MG/3ML SOLN Take 3 mLs by nebulization every 4 (four) hours as needed.  . nebivolol (BYSTOLIC) 5 MG tablet Take 1 tablet (5 mg total) by mouth daily.  . [DISCONTINUED] albuterol (PROVENTIL HFA;VENTOLIN HFA) 108 (90 Base) MCG/ACT inhaler Inhale 2 puffs into the lungs every 4 (four) hours as needed for wheezing or shortness of breath.  . [DISCONTINUED] albuterol (PROVENTIL) (2.5 MG/3ML) 0.083% nebulizer solution Take 2.5 mg by nebulization every 6 (six) hours as needed for wheezing or shortness of breath.  . [DISCONTINUED] ipratropium-albuterol (DUONEB) 0.5-2.5 (3) MG/3ML SOLN Take 3 mLs by nebulization every 4 (four) hours as needed.   No facility-administered encounter medications on file as of 08/25/2017.     Allergies: Patient has no known allergies.  Body mass index is 20.29 kg/m.  Blood pressure (!) 144/79, pulse (!) 106, height 5\' 1"  (1.549 m), weight 107 lb 6.4 oz (48.7 kg), SpO2 91 %.   Review of Systems  Constitutional: Positive for activity change, appetite change, fatigue and unexpected weight change. Negative for chills, diaphoresis and fever.  Eyes: Negative for visual disturbance.  Respiratory: Positive for cough, chest tightness, shortness of breath and wheezing. Negative for stridor.   Cardiovascular: Negative for chest pain, palpitations and leg swelling.  Gastrointestinal: Negative for abdominal distention, abdominal pain, blood in stool, constipation, diarrhea, nausea and vomiting.  Endocrine: Negative for cold intolerance, heat intolerance, polydipsia, polyphagia and polyuria.  Genitourinary: Negative for difficulty urinating, flank pain and hematuria.  Skin: Negative for color change, pallor, rash and wound.  Neurological: Negative for dizziness and headaches.  Hematological: Does  not bruise/bleed easily.  Psychiatric/Behavioral: Positive for sleep disturbance. Negative for dysphoric mood. The patient is not nervous/anxious.        Objective:   Physical Exam  Constitutional: Chelsea Baker is oriented to person, place, and time. Chelsea Baker appears well-developed and well-nourished. Chelsea Baker appears distressed.  HENT:  Head: Normocephalic and atraumatic.  Right Ear: External ear normal.  Left Ear: External ear normal.  Cardiovascular: Regular rhythm, normal heart sounds and intact distal pulses. Tachycardia present.  No murmur heard. Pulmonary/Chest: No respiratory distress. Chelsea Baker has decreased breath sounds in the right upper field and the right middle field. Chelsea Baker has wheezes. Chelsea Baker has rhonchi. Chelsea Baker has no rales. Chelsea Baker exhibits no tenderness.  Accessory muscle use for breathing.  Frequent episodes of gasping tachypneic breathing.  Dyspnea on exertion Chelsea Baker ambulates safely/slowly Chelsea Baker was on 3L during OV  Neurological: Chelsea Baker is alert and oriented to person, place, and time.  Skin:  Skin is warm and dry. Chelsea Baker is not diaphoretic.  Dusky apperance  Psychiatric: Chelsea Baker has a normal mood and affect. Her behavior is normal. Judgment and thought content normal.  Nursing note and vitals reviewed.         Assessment & Plan:    1. Secondary hypertension   2. AAA (abdominal aortic aneurysm) without rupture ([place])   3. Dilated cardiomyopathy ([place])   4. Tachycardia   5. Shortness of breath   6. Malignant neoplasm of upper lobe of right lung (HCC)     AAA (abdominal aortic aneurysm) without rupture ([place]) Per cards/ Dr. Ellyn Hack 08/10/17 OV- stable  Dilated cardiomyopathy ([place]) Plan: Lexiscan Myoview -exclude ischemia  Currently on Bystolic 5 mg    Tachycardia HR elevated at OV 106 Currently on Bystolic 5mg  QD   Hypertension BP 144/79, HR 106 Currently on Bystolic 5mg  QD Followed by cards   Shortness of breath DOE during OV Hx of COPD? Advised to keep home O2 2-3L, no higher than  3L  Lung cancer [facility]) Jan 2019 Hospitalization- Presented to ED with 2 week hx of cough/wt loss/increasing dyspnea. CXR in ED revealed 3.4 cm RUL mass Subsequent CT showed 3.5 cm RUL mass concerning for primary bronchogenic carcinoma and 4 mm nodule in RLL- indeterminate. No evidence of metastasis. Bx + squamous cel carcinoma    Pt was in the office today for 40+ minutes, with over 50% time spent in face to face counseling of various medical concerns and in coordination of care   FOLLOW-UP:  Return in about 3 months (around 11/22/2017) for CPE, Fasting Labs.

## 2017-08-24 NOTE — Telephone Encounter (Signed)
Encounter complete. 

## 2017-08-25 ENCOUNTER — Encounter: Payer: Self-pay | Admitting: Adult Health

## 2017-08-25 ENCOUNTER — Ambulatory Visit (INDEPENDENT_AMBULATORY_CARE_PROVIDER_SITE_OTHER): Payer: PPO | Admitting: Adult Health

## 2017-08-25 VITALS — BP 144/79 | HR 106 | Ht 61.0 in | Wt 107.4 lb

## 2017-08-25 DIAGNOSIS — I714 Abdominal aortic aneurysm, without rupture, unspecified: Secondary | ICD-10-CM

## 2017-08-25 DIAGNOSIS — R0602 Shortness of breath: Secondary | ICD-10-CM

## 2017-08-25 DIAGNOSIS — R000 Tachycardia, unspecified: Secondary | ICD-10-CM

## 2017-08-25 DIAGNOSIS — C3411 Malignant neoplasm of upper lobe, right bronchus or lung: Secondary | ICD-10-CM

## 2017-08-25 DIAGNOSIS — I159 Secondary hypertension, unspecified: Secondary | ICD-10-CM

## 2017-08-25 DIAGNOSIS — I10 Essential (primary) hypertension: Secondary | ICD-10-CM | POA: Insufficient documentation

## 2017-08-25 DIAGNOSIS — I420 Dilated cardiomyopathy: Secondary | ICD-10-CM | POA: Diagnosis not present

## 2017-08-25 MED ORDER — IPRATROPIUM-ALBUTEROL 0.5-2.5 (3) MG/3ML IN SOLN: 3.0000 mL | RESPIRATORY_TRACT | 3 refills | Status: DC | PRN

## 2017-08-25 MED ORDER — ALBUTEROL SULFATE HFA 108 (90 BASE) MCG/ACT IN AERS: 2.0000 | INHALATION_SPRAY | RESPIRATORY_TRACT | 0 refills | Status: DC | PRN

## 2017-08-25 NOTE — Patient Instructions (Addendum)
Shortness of Breath, Adult Shortness of breath means you have trouble breathing. Your lungs are organs for breathing. Follow these instructions at home: Pay attention to any changes in your symptoms. Take these actions to help with your condition:  Do not smoke. Smoking can cause shortness of breath. If you need help to quit smoking, ask your doctor.  Avoid things that can make it harder to breathe, such as: ? Mold. ? Dust. ? Air pollution. ? Chemical smells. ? Things that can cause allergy symptoms (allergens), if you have allergies.  Keep your living space clean and free of mold and dust.  Rest as needed. Slowly return to your usual activities.  Take over-the-counter and prescription medicines, including oxygen and inhaled medicines, only as told by your doctor.  Keep all follow-up visits as told by your doctor. This is important.  Contact a doctor if:  Your condition does not get better as soon as expected.  You have a hard time doing your normal activities, even after you rest.  You have new symptoms. Get help right away if:  You have trouble breathing when you are resting.  You feel light-headed or you faint.  You have a cough that is not helped by medicines.  You cough up blood.  You have pain with breathing.  You have pain in your chest, arms, shoulders, or belly (abdomen).  You have a fever.  You cannot walk up stairs.  You cannot exercise the way you normally do. This information is not intended to replace advice given to you by your health care provider. Make sure you discuss any questions you have with your health care provider. Document Released: 12/02/2007 Document Revised: 07/02/2016 Document Reviewed: 07/02/2016 Elsevier Interactive Patient Education  2017 [place].   Hypertension Hypertension, commonly called high blood pressure, is when the force of blood pumping through the arteries is too strong. The arteries are the blood vessels that  carry blood from the heart throughout the body. Hypertension forces the heart to work harder to pump blood and may cause arteries to become narrow or stiff. Having untreated or uncontrolled hypertension can cause heart attacks, strokes, kidney disease, and other problems. A blood pressure reading consists of a higher number over a lower number. Ideally, your blood pressure should be below 120/80. The first ("top") number is called the systolic pressure. It is a measure of the pressure in your arteries as your heart beats. The second ("bottom") number is called the diastolic pressure. It is a measure of the pressure in your arteries as the heart relaxes. What are the causes? The cause of this condition is not known. What increases the risk? Some risk factors for high blood pressure are under your control. Others are not. Factors you can change  Smoking.  Having type 2 diabetes mellitus, high cholesterol, or both.  Not getting enough exercise or physical activity.  Being overweight.  Having too much fat, sugar, calories, or salt (sodium) in your diet.  Drinking too much alcohol. Factors that are difficult or impossible to change  Having chronic kidney disease.  Having a family history of high blood pressure.  Age. Risk increases with age.  Race. You may be at higher risk if you are African-American.  Gender. Men are at higher risk than women before age 42. After age 48, women are at higher risk than men.  Having obstructive sleep apnea.  Stress. What are the signs or symptoms? Extremely high blood pressure (hypertensive crisis) may cause:  Headache.  Anxiety.  Shortness of breath.  Nosebleed.  Nausea and vomiting.  Severe chest pain.  Jerky movements you cannot control (seizures).  How is this diagnosed? This condition is diagnosed by measuring your blood pressure while you are seated, with your arm resting on a surface. The cuff of the blood pressure monitor will be  placed directly against the skin of your upper arm at the level of your heart. It should be measured at least twice using the same arm. Certain conditions can cause a difference in blood pressure between your right and left arms. Certain factors can cause blood pressure readings to be lower or higher than normal (elevated) for a short period of time:  When your blood pressure is higher when you are in a health care provider's office than when you are at home, this is called white coat hypertension. Most people with this condition do not need medicines.  When your blood pressure is higher at home than when you are in a health care provider's office, this is called masked hypertension. Most people with this condition may need medicines to control blood pressure.  If you have a high blood pressure reading during one visit or you have normal blood pressure with other risk factors:  You may be asked to return on a different day to have your blood pressure checked again.  You may be asked to monitor your blood pressure at home for 1 week or longer.  If you are diagnosed with hypertension, you may have other blood or imaging tests to help your health care provider understand your overall risk for other conditions. How is this treated? This condition is treated by making healthy lifestyle changes, such as eating healthy foods, exercising more, and reducing your alcohol intake. Your health care provider may prescribe medicine if lifestyle changes are not enough to get your blood pressure under control, and if:  Your systolic blood pressure is above 130.  Your diastolic blood pressure is above 80.  Your personal target blood pressure may vary depending on your medical conditions, your age, and other factors. Follow these instructions at home: Eating and drinking  Eat a diet that is high in fiber and potassium, and low in sodium, added sugar, and fat. An example eating plan is called the DASH (Dietary  Approaches to Stop Hypertension) diet. To eat this way: ? Eat plenty of fresh fruits and vegetables. Try to fill half of your plate at each meal with fruits and vegetables. ? Eat whole grains, such as whole wheat pasta, brown rice, or whole grain bread. Fill about one quarter of your plate with whole grains. ? Eat or drink low-fat dairy products, such as skim milk or low-fat yogurt. ? Avoid fatty cuts of meat, processed or cured meats, and poultry with skin. Fill about one quarter of your plate with lean proteins, such as fish, chicken without skin, beans, eggs, and tofu. ? Avoid premade and processed foods. These tend to be higher in sodium, added sugar, and fat.  Reduce your daily sodium intake. Most people with hypertension should eat less than 1,500 mg of sodium a day.  Limit alcohol intake to no more than 1 drink a day for nonpregnant women and 2 drinks a day for men. One drink equals 12 oz of beer, 5 oz of wine, or 1 oz of hard liquor. Lifestyle  Work with your health care provider to maintain a healthy body weight or to lose weight. Ask what an ideal weight is for  you.  Get at least 30 minutes of exercise that causes your heart to beat faster (aerobic exercise) most days of the week. Activities may include walking, swimming, or biking.  Include exercise to strengthen your muscles (resistance exercise), such as pilates or lifting weights, as part of your weekly exercise routine. Try to do these types of exercises for 30 minutes at least 3 days a week.  Do not use any products that contain nicotine or tobacco, such as cigarettes and e-cigarettes. If you need help quitting, ask your health care provider.  Monitor your blood pressure at home as told by your health care provider.  Keep all follow-up visits as told by your health care provider. This is important. Medicines  Take over-the-counter and prescription medicines only as told by your health care provider. Follow directions  carefully. Blood pressure medicines must be taken as prescribed.  Do not skip doses of blood pressure medicine. Doing this puts you at risk for problems and can make the medicine less effective.  Ask your health care provider about side effects or reactions to medicines that you should watch for. Contact a health care provider if:  You think you are having a reaction to a medicine you are taking.  You have headaches that keep coming back (recurring).  You feel dizzy.  You have swelling in your ankles.  You have trouble with your vision. Get help right away if:  You develop a severe headache or confusion.  You have unusual weakness or numbness.  You feel faint.  You have severe pain in your chest or abdomen.  You vomit repeatedly.  You have trouble breathing. Summary  Hypertension is when the force of blood pumping through your arteries is too strong. If this condition is not controlled, it may put you at risk for serious complications.  Your personal target blood pressure may vary depending on your medical conditions, your age, and other factors. For most people, a normal blood pressure is less than 120/80.  Hypertension is treated with lifestyle changes, medicines, or a combination of both. Lifestyle changes include weight loss, eating a healthy, low-sodium diet, exercising more, and limiting alcohol. This information is not intended to replace advice given to you by your health care provider. Make sure you discuss any questions you have with your health care provider. Document Released: 06/15/2005 Document Revised: 05/13/2016 Document Reviewed: 05/13/2016 Elsevier Interactive Patient Education  2018 [place] not increase portable oxygen above 2 L Continue to drink water and follow heart healthy diet. Continue all medications as directed. Please follow-up with your various specialists as directed. Recommend complete physical with fasting labs in late Spring-  after radiation therapy completed. Please call clinic with any questions/concerns. WELCOME TO THE PRACTICE!

## 2017-08-25 NOTE — Assessment & Plan Note (Signed)
HR elevated at OV 106 Currently on Bystolic 5mg  QD

## 2017-08-25 NOTE — Assessment & Plan Note (Signed)
Per cards/ Dr. Ellyn Hack 08/10/17 OV- stable

## 2017-08-25 NOTE — Assessment & Plan Note (Signed)
DOE during [place] Hx of COPD? Advised to keep home O2 2-3L, no higher than 3L

## 2017-08-25 NOTE — Assessment & Plan Note (Signed)
BP 144/79, HR 106 Currently on Bystolic 5mg  QD Followed by cards

## 2017-08-25 NOTE — Assessment & Plan Note (Signed)
Plan: Lexiscan Myoview -exclude ischemia  Currently on Bystolic 5 mg

## 2017-08-25 NOTE — Assessment & Plan Note (Signed)
Jan 2019 Hospitalization- Presented to ED with 2 week hx of cough/wt loss/increasing dyspnea. CXR in ED revealed 3.4 cm RUL mass Subsequent CT showed 3.5 cm RUL mass concerning for primary bronchogenic carcinoma and 4 mm nodule in RLL- indeterminate. No evidence of metastasis. Bx + squamous cel carcinoma

## 2017-08-26 ENCOUNTER — Ambulatory Visit (HOSPITAL_COMMUNITY)
Admission: RE | Admit: 2017-08-26 | Discharge: 2017-08-26 | Disposition: A | Discharge status: Home / Self Care | Payer: PPO | Source: Ambulatory Visit | Attending: Cardiovascular Disease | Admitting: Cardiovascular Disease

## 2017-08-26 DIAGNOSIS — R0789 Other chest pain: Secondary | ICD-10-CM | POA: Diagnosis not present

## 2017-08-26 DIAGNOSIS — R000 Tachycardia, unspecified: Secondary | ICD-10-CM | POA: Diagnosis not present

## 2017-08-26 DIAGNOSIS — R0602 Shortness of breath: Secondary | ICD-10-CM | POA: Insufficient documentation

## 2017-08-26 DIAGNOSIS — I420 Dilated cardiomyopathy: Secondary | ICD-10-CM | POA: Diagnosis not present

## 2017-08-26 LAB — MYOCARDIAL PERFUSION IMAGING
CHL CUP RESTING HR STRESS: 89 {beats}/min
CSEPPHR: 111 {beats}/min
LV dias vol: 90 mL (ref 46–106)
LVSYSVOL: 47 mL
SDS: 3
SRS: 3
SSS: 6
TID: 1.15

## 2017-08-26 LAB — ACID FAST CULTURE WITH REFLEXED SENSITIVITIES (MYCOBACTERIA): Acid Fast Culture: NEGATIVE

## 2017-08-26 LAB — ACID FAST CULTURE WITH REFLEXED SENSITIVITIES: ACID FAST CULTURE - AFSCU3: NEGATIVE

## 2017-08-26 MED ORDER — TECHNETIUM TC 99M TETROFOSMIN IV KIT
10.5000 | PACK | Freq: Once | INTRAVENOUS | Status: AC | PRN
  Administered 2017-08-26: 10.5 via INTRAVENOUS
  Filled 2017-08-26: qty 11

## 2017-08-26 MED ORDER — REGADENOSON 0.4 MG/5ML IV SOLN
0.4000 mg | Freq: Once | INTRAVENOUS | Status: AC
  Administered 2017-08-26: 0.4 mg via INTRAVENOUS

## 2017-08-26 MED ORDER — AMINOPHYLLINE 25 MG/ML IV SOLN
75.0000 mg | Freq: Once | INTRAVENOUS | Status: AC
Start: 2017-08-26 — End: 2017-08-26
  Administered 2017-08-26: 75 mg via INTRAVENOUS

## 2017-08-26 MED ORDER — TECHNETIUM TC 99M TETROFOSMIN IV KIT
30.7000 | PACK | Freq: Once | INTRAVENOUS | Status: AC | PRN
  Administered 2017-08-26: 30.7 via INTRAVENOUS
  Filled 2017-08-26: qty 31

## 2017-08-27 ENCOUNTER — Other Ambulatory Visit: Payer: Self-pay

## 2017-08-27 ENCOUNTER — Ambulatory Visit: Payer: Self-pay

## 2017-08-27 DIAGNOSIS — Z510 Encounter for antineoplastic radiation therapy: Secondary | ICD-10-CM | POA: Diagnosis not present

## 2017-08-27 DIAGNOSIS — C3411 Malignant neoplasm of upper lobe, right bronchus or lung: Secondary | ICD-10-CM | POA: Diagnosis not present

## 2017-08-27 NOTE — Patient Outreach (Signed)
[facility]) Care Management  08/27/2017  SHERY WAUNEKA [birth year] [id number]   Transition of Care     Outreach attempt #3 to patient. No answer at present and voicemail message left. Multiple attempts to establish contact with patient without success. No response from letter mailed to patient. Case is being closed at this time.       Plan: RN CM will notify [facility] administrative assistant of case status. RN CM will send case closure letter to MD.   Enzo Montgomery, RN,BSN,CCM [place] Telephonic Care Management Coordinator Direct Phone: [phone] Toll Free: [phone] Fax: [phone]

## 2017-08-30 ENCOUNTER — Telehealth: Payer: Self-pay | Admitting: Cardiology

## 2017-08-30 ENCOUNTER — Telehealth: Payer: Self-pay | Admitting: *Deleted

## 2017-08-30 NOTE — Telephone Encounter (Addendum)
SPOKE TO PATIENT. RESULT GIVEN. SHE VERBALIZED UNDERSTANDING. PER PATIENT , REQUEST CANCELLED UPCOMING APPOINTMENT FOR FOLLOW UP.

## 2017-08-30 NOTE — Telephone Encounter (Signed)
LEFT DETAILED RESULT  MESSAGE ON SECURE VOICE MAIL OF PATIENT . PER DPR.  ANY QUESTION MAY CALL BACK .

## 2017-08-30 NOTE — Telephone Encounter (Signed)
Follow Up  Pt returning call for nurse with questions on her test results. Please call

## 2017-08-30 NOTE — Telephone Encounter (Signed)
-----   Message from Leonie Man, MD sent at 08/28/2017  2:36 PM EST ----- Good news.  The nuclear stress test does not show any evidence of heart artery disease.  The pump function looks relatively normal.  There does appear to be some mild artifact, but overall LOW RISK study.  Glenetta Hew, MD

## 2017-08-31 ENCOUNTER — Telehealth: Payer: Self-pay | Admitting: Cardiology

## 2017-08-31 ENCOUNTER — Telehealth: Payer: Self-pay | Admitting: Adult Health

## 2017-08-31 NOTE — Telephone Encounter (Signed)
FYI to provider she had stress test @ Cardiology & says results should be in Epic as she request department to forward them to PCP--- just wanted her doctor to review them for the record.  --glh

## 2017-08-31 NOTE — Telephone Encounter (Signed)
New Message:    Pt calling pertaining to conversation she had with Ivin Booty the other day about a medication. Patient hung up before telling me which medication she is pertaining to.

## 2017-08-31 NOTE — Telephone Encounter (Signed)
Patient states she can not afford  bystolic - the cost $70  Rx. Would like to know can she return  Original medication carvedilol or something else cheaper? Aware will defer to Dr Ellyn Hack.

## 2017-09-01 ENCOUNTER — Ambulatory Visit
Admission: RE | Admit: 2017-09-01 | Discharge: 2017-09-01 | Disposition: A | Discharge status: Home / Self Care | Payer: PPO | Source: Ambulatory Visit | Attending: Radiation Oncology | Admitting: Radiation Oncology

## 2017-09-01 DIAGNOSIS — Z510 Encounter for antineoplastic radiation therapy: Secondary | ICD-10-CM | POA: Diagnosis not present

## 2017-09-01 NOTE — Telephone Encounter (Signed)
Lets try Bisoprolol 10 mg or Toprol 50 mg.  Exline

## 2017-09-03 ENCOUNTER — Ambulatory Visit
Admission: RE | Admit: 2017-09-03 | Discharge: 2017-09-03 | Disposition: A | Discharge status: Home / Self Care | Payer: PPO | Source: Ambulatory Visit | Attending: Radiation Oncology | Admitting: Radiation Oncology

## 2017-09-03 DIAGNOSIS — Z510 Encounter for antineoplastic radiation therapy: Secondary | ICD-10-CM | POA: Diagnosis not present

## 2017-09-06 ENCOUNTER — Ambulatory Visit
Admission: RE | Admit: 2017-09-06 | Discharge: 2017-09-06 | Disposition: A | Discharge status: Home / Self Care | Payer: PPO | Source: Ambulatory Visit | Attending: Radiation Oncology | Admitting: Radiation Oncology

## 2017-09-06 DIAGNOSIS — Z510 Encounter for antineoplastic radiation therapy: Secondary | ICD-10-CM | POA: Diagnosis not present

## 2017-09-06 MED ORDER — METOPROLOL SUCCINATE ER 50 MG PO TB24: 50.0000 mg | ORAL_TABLET | Freq: Every day | ORAL | 3 refills | Status: DC

## 2017-09-06 NOTE — Telephone Encounter (Signed)
Spoke to patient -aware of the change to metoprolol succinate 50 mg daily. 90 day supply x 3

## 2017-09-07 ENCOUNTER — Ambulatory Visit: Payer: PPO | Admitting: Cardiology

## 2017-09-08 ENCOUNTER — Ambulatory Visit
Admission: RE | Admit: 2017-09-08 | Discharge: 2017-09-08 | Disposition: A | Discharge status: Home / Self Care | Payer: PPO | Source: Ambulatory Visit | Attending: Radiation Oncology | Admitting: Radiation Oncology

## 2017-09-08 DIAGNOSIS — Z510 Encounter for antineoplastic radiation therapy: Secondary | ICD-10-CM | POA: Diagnosis not present

## 2017-09-10 ENCOUNTER — Encounter: Payer: Self-pay | Admitting: Radiation Oncology

## 2017-09-10 ENCOUNTER — Ambulatory Visit
Admission: RE | Admit: 2017-09-10 | Discharge: 2017-09-10 | Disposition: A | Discharge status: Home / Self Care | Payer: PPO | Source: Ambulatory Visit | Attending: Radiation Oncology | Admitting: Radiation Oncology

## 2017-09-10 DIAGNOSIS — C3411 Malignant neoplasm of upper lobe, right bronchus or lung: Secondary | ICD-10-CM | POA: Diagnosis not present

## 2017-09-10 DIAGNOSIS — Z510 Encounter for antineoplastic radiation therapy: Secondary | ICD-10-CM | POA: Diagnosis not present

## 2017-09-15 ENCOUNTER — Other Ambulatory Visit: Payer: Self-pay

## 2017-09-15 ENCOUNTER — Emergency Department (HOSPITAL_COMMUNITY): Payer: PPO

## 2017-09-15 ENCOUNTER — Encounter (HOSPITAL_COMMUNITY): Payer: Self-pay | Admitting: Emergency Medicine

## 2017-09-15 ENCOUNTER — Emergency Department (HOSPITAL_COMMUNITY)
Admission: EM | Admit: 2017-09-15 | Discharge: 2017-09-16 | Disposition: A | Discharge status: Home / Self Care | Payer: PPO | Attending: Emergency Medicine | Admitting: Emergency Medicine

## 2017-09-15 DIAGNOSIS — R531 Weakness: Secondary | ICD-10-CM | POA: Insufficient documentation

## 2017-09-15 DIAGNOSIS — C3490 Malignant neoplasm of unspecified part of unspecified bronchus or lung: Secondary | ICD-10-CM | POA: Insufficient documentation

## 2017-09-15 DIAGNOSIS — R0600 Dyspnea, unspecified: Secondary | ICD-10-CM | POA: Diagnosis not present

## 2017-09-15 DIAGNOSIS — I471 Supraventricular tachycardia: Secondary | ICD-10-CM | POA: Insufficient documentation

## 2017-09-15 DIAGNOSIS — J111 Influenza due to unidentified influenza virus with other respiratory manifestations: Secondary | ICD-10-CM | POA: Diagnosis not present

## 2017-09-15 DIAGNOSIS — I10 Essential (primary) hypertension: Secondary | ICD-10-CM | POA: Diagnosis not present

## 2017-09-15 DIAGNOSIS — R0602 Shortness of breath: Secondary | ICD-10-CM | POA: Diagnosis not present

## 2017-09-15 DIAGNOSIS — M7918 Myalgia, other site: Secondary | ICD-10-CM | POA: Insufficient documentation

## 2017-09-15 DIAGNOSIS — Z87891 Personal history of nicotine dependence: Secondary | ICD-10-CM | POA: Diagnosis not present

## 2017-09-15 DIAGNOSIS — R69 Illness, unspecified: Secondary | ICD-10-CM

## 2017-09-15 DIAGNOSIS — R062 Wheezing: Secondary | ICD-10-CM | POA: Diagnosis not present

## 2017-09-15 LAB — CBC WITH DIFFERENTIAL/PLATELET
BASOS PCT: 0 %
Basophils Absolute: 0 10*3/uL (ref 0.0–0.1)
EOS ABS: 0.2 10*3/uL (ref 0.0–0.7)
Eosinophils Relative: 3 %
HCT: 39.4 % (ref 36.0–46.0)
HEMOGLOBIN: 11.9 g/dL — AB (ref 12.0–15.0)
Lymphocytes Relative: 13 %
Lymphs Abs: 1 10*3/uL (ref 0.7–4.0)
MCH: 26.6 pg (ref 26.0–34.0)
MCHC: 30.2 g/dL (ref 30.0–36.0)
MCV: 88.1 fL (ref 78.0–100.0)
MONOS PCT: 8 %
Monocytes Absolute: 0.6 10*3/uL (ref 0.1–1.0)
NEUTROS ABS: 5.8 10*3/uL (ref 1.7–7.7)
NEUTROS PCT: 76 %
Platelets: 426 10*3/uL — ABNORMAL HIGH (ref 150–400)
RBC: 4.47 MIL/uL (ref 3.87–5.11)
RDW: 14.7 % (ref 11.5–15.5)
WBC: 7.7 10*3/uL (ref 4.0–10.5)

## 2017-09-15 LAB — COMPREHENSIVE METABOLIC PANEL
ALK PHOS: 82 U/L (ref 38–126)
ALT: 12 U/L — AB (ref 14–54)
AST: 18 U/L (ref 15–41)
Albumin: 3.4 g/dL — ABNORMAL LOW (ref 3.5–5.0)
Anion gap: 11 (ref 5–15)
BUN: 15 mg/dL (ref 6–20)
CALCIUM: 9.3 mg/dL (ref 8.9–10.3)
CHLORIDE: 91 mmol/L — AB (ref 101–111)
CO2: 33 mmol/L — AB (ref 22–32)
CREATININE: 0.82 mg/dL (ref 0.44–1.00)
GFR calc non Af Amer: >60 mL/min (ref >60)
Glucose, Bld: 141 mg/dL — ABNORMAL HIGH (ref 65–99)
Potassium: 3.6 mmol/L (ref 3.5–5.1)
SODIUM: 135 mmol/L (ref 135–145)
Total Bilirubin: 0.4 mg/dL (ref 0.3–1.2)
Total Protein: 7.4 g/dL (ref 6.5–8.1)

## 2017-09-15 LAB — I-STAT CG4 LACTIC ACID, ED: LACTIC ACID, VENOUS: 0.87 mmol/L (ref 0.5–1.9)

## 2017-09-15 LAB — INFLUENZA PANEL BY PCR (TYPE A & B)
INFLAPCR: NEGATIVE
INFLBPCR: NEGATIVE

## 2017-09-15 LAB — I-STAT TROPONIN, ED: TROPONIN I, POC: 0 ng/mL (ref 0.00–0.08)

## 2017-09-15 MED ORDER — METOPROLOL TARTRATE 5 MG/5ML IV SOLN
5.0000 mg | Freq: Once | INTRAVENOUS | Status: AC
  Administered 2017-09-16: 5 mg via INTRAVENOUS
  Filled 2017-09-15: qty 5

## 2017-09-15 NOTE — ED Notes (Signed)
Bed: TD97 Expected date:  Expected time:  Means of arrival:  Comments: 72 yo F  Cancer pt

## 2017-09-15 NOTE — ED Triage Notes (Signed)
Pt from home called EMS with c/o generalized body ache on arrival EMS discovered pt SOB with generalized wheezing duoneb 5mg  / 0.5mg , and solumedrol 125 mg iv,  Zofran 4mg  iv, given prior to arrival. Enroute  Pt had 6 sec run of SVT that quickly resolved and on arrival  Pt was ST rate 120's

## 2017-09-15 NOTE — ED Provider Notes (Signed)
[facility] DEPT Provider Note   CSN: [id number] Arrival date & time: 09/15/17  2121     History   Chief Complaint Chief Complaint  Patient presents with  . Generalized Body Aches  . Shortness of Breath    HPI Chelsea Baker is a 72 y.o. female.  HPI She reports that she has been aching in multiple areas.  She has had generalized weakness.  No fever that she has documented.  She reports over the past few days she has noticed episodes where her heart is racing.  Patient is undergoing radiation therapy for lung cancer.  sHe denies lower extremity swelling.  No vomiting or diarrhea. Past Medical History:  Diagnosis Date  . AAA (abdominal aortic aneurysm) ([place])   . Diverticulosis   . GERD (gastroesophageal reflux disease)   . Hiatal hernia   . Hypertension   . Hypokalemia   . Lumbar spondylolysis   . Lung cancer ([place])   . Lung tumor   . On home O2    2L N/C    Patient Active Problem List   Diagnosis Date Noted  . Hypertension 08/25/2017  . Chest discomfort 08/10/2017  . Abnormal echocardiogram 08/10/2017  . Hypoxia   . Dilated cardiomyopathy ([place]) 07/17/2017  . Shortness of breath   . Current every day smoker   . Protein-calorie malnutrition ([place])   . Lung cancer ([place]) 07/12/2017  . Tachycardia 07/12/2017  . Hyponatremia 07/12/2017  . Cavitating mass in right upper lung lobe 07/12/2017  . AAA (abdominal aortic aneurysm) without rupture ([place]) 04/10/2014  . Aftercare following surgery of the circulatory system 04/10/2014  . Aftercare following surgery of the circulatory system, [place] 11/15/2012    Past Surgical History:  Procedure Laterality Date  . ABDOMINAL AORTIC ANEURYSM REPAIR  10/01/10   endostent repair  . CATARACT EXTRACTION, BILATERAL    . LUNG BIOPSY    . NM MYOVIEW LTD  2012   Normal Lexiscan Myoview with no ischemia or infarction.  Normal LV function.  EF 67%.  . TONSILLECTOMY    . TRANSTHORACIC ECHOCARDIOGRAM   07/17/2017   EF 35-40%.  Septal mid and basal inferior wall hypokinesis.  Mildly dilated LV.  Mildly reduced EF.  GR 1 DD.  Calcified mitral annulus.     OB History    No data available       Home Medications    Prior to Admission medications   Medication Sig Start Date End Date Taking? Authorizing Provider  acetaminophen (TYLENOL) 500 MG tablet Take 500-1,000 mg by mouth every 6 (six) hours as needed for mild pain, moderate pain or headache.   Yes [provider]  albuterol (PROVENTIL HFA;VENTOLIN HFA) 108 (90 Base) MCG/ACT inhaler Inhale 2 puffs into the lungs every 4 (four) hours as needed for wheezing or shortness of breath. 08/25/17  Yes Danford, Katy D, NP  ipratropium-albuterol (DUONEB) 0.5-2.5 (3) MG/3ML SOLN Take 3 mLs by nebulization every 4 (four) hours as needed. Patient taking differently: Take 3 mLs by nebulization every 4 (four) hours as needed (sob).  08/25/17  Yes Danford, Valetta Fuller D, NP  metoprolol succinate (TOPROL-XL) 50 MG 24 hr tablet Take 1 tablet (50 mg total) by mouth daily. Take with or immediately following a meal. 09/06/17 12/05/17  Leonie Man, MD    Family History Family History  Problem Relation Age of Onset  . Heart failure Mother   . Heart attack Mother 62       In 61s -  1st MI  . Coronary artery disease Mother   . Coronary artery disease Father        Died at age 48  . Heart attack Father 86  . Heart disease Maternal Grandmother   . Heart disease Maternal Grandfather   . Cancer Neg Hx     Social History Social History   Tobacco Use  . Smoking status: Former Smoker    Packs/day: 1.00    Years: 50.00    Pack years: 50.00    Types: Cigarettes    Last attempt to quit: 06/2017    Years since quitting: 0.2  . Smokeless tobacco: Never Used  Substance Use Topics  . Alcohol use: Yes    Comment: rarely  . Drug use: No     Allergies   Patient has no known allergies.   Review of Systems Review of Systems 10 Systems reviewed and  are negative for acute change except as noted in the HPI.   Physical Exam Updated Vital Signs BP 126/63   Pulse (!) 111   Temp 98.6 F (37 C) (Oral)   Resp (!) 24   SpO2 96%   Physical Exam  Constitutional: She is oriented to person, place, and time.  Is alert and appropriate.  Moderate increased work of breathing.  HENT:  Head: Normocephalic and atraumatic.  Mouth/Throat: Oropharynx is clear and moist.  Eyes: EOM are normal.  Cardiovascular:  Tachycardia.  No gross rub murmur gallop.  Pulmonary/Chest:  Moderate increased work of breathing.  Soft breath sounds.  Occasional expiratory wheeze.  Abdominal: Soft. She exhibits no distension. There is no tenderness. There is no guarding.  Musculoskeletal: Normal range of motion. She exhibits no edema or tenderness.  Neurological: She is alert and oriented to person, place, and time. No cranial nerve deficit. She exhibits normal muscle tone. Coordination normal.  Skin: Skin is warm and dry.  Psychiatric: She has a normal mood and affect.     ED Treatments / Results  Labs (all labs ordered are listed, but only abnormal results are displayed) Labs Reviewed  COMPREHENSIVE METABOLIC PANEL - Abnormal; Notable for the following components:      Result Value   Chloride 91 (*)    CO2 33 (*)    Glucose, Bld 141 (*)    Albumin 3.4 (*)    ALT 12 (*)    All other components within normal limits  CBC WITH DIFFERENTIAL/PLATELET - Abnormal; Notable for the following components:   Hemoglobin 11.9 (*)    Platelets 426 (*)    All other components within normal limits  CULTURE, BLOOD (ROUTINE X 2)  CULTURE, BLOOD (ROUTINE X 2)  URINE CULTURE  INFLUENZA PANEL BY PCR (TYPE A & B)  BRAIN NATRIURETIC PEPTIDE  URINALYSIS, ROUTINE W REFLEX MICROSCOPIC  I-STAT TROPONIN, ED  I-STAT CG4 LACTIC ACID, ED  I-STAT CG4 LACTIC ACID, ED    EKG  EKG Interpretation  Date/Time:  Wednesday September 15 2017 21:54:17 EDT Ventricular Rate:  202 PR  Interval:    QRS Duration: 80 QT Interval:  272 QTC Calculation: 499 R Axis:   63 Text Interpretation:  Supraventricular tachycardia Probable LVH with secondary repol abnrm SVT vs flutter Confirmed by Charlesetta Shanks [phone]) on 09/15/2017 11:05:17 PM       Radiology Dg Chest Port 1 View  Result Date: 09/15/2017 CLINICAL DATA:  Shortness of breath EXAM: PORTABLE CHEST 1 VIEW COMPARISON:  PET-CT 08/11/2017, radiograph 07/27/2017 FINDINGS: Hyperinflation. Right upper lobe lung mass. No acute  consolidation otherwise. Stable cardiomediastinal silhouette with aortic atherosclerosis. No pneumothorax. IMPRESSION: 1. Right upper lobe lung mass as before 2. Otherwise no acute interval changes. Electronically Signed   By: Donavan Foil M.D.   On: 09/15/2017 22:38    Procedures Procedures (including critical care time)  Medications Ordered in ED Medications  sodium chloride 0.9 % injection (not administered)  iopamidol (ISOVUE-370) 76 % injection (not administered)  iopamidol (ISOVUE-370) 76 % injection 100 mL (not administered)  metoprolol tartrate (LOPRESSOR) injection 5 mg (5 mg Intravenous Given 09/16/17 0005)     Initial Impression / Assessment and Plan / ED Course  I have reviewed the triage vital signs and the nursing notes.  Pertinent labs & imaging results that were available during my care of the patient were reviewed by me and considered in my medical decision making (see chart for details).      Final Clinical Impressions(s) / ED Diagnoses   Final diagnoses:  Malignant neoplasm of lung, unspecified laterality, unspecified part of lung (HCC)  Influenza-like illness  Paroxysmal SVT (supraventricular tachycardia) (HCC)  Shortness of breath  Patient presents with symptoms of generalized body aches and fatigue with increasing shortness of breath.  She has known history of lung cancer and is undergoing radiation therapy.  She was hypoxic and treated with nebulizer therapy by EMS.   On route she was having episodes of heart rates up to the 200s.  These were vacillating between sinus tachycardia in the 120s and suspected SVT in the 200s.  Patient's mental status remains clear.  She does have moderate increased work of breathing.  We will proceed with repeat CT study to rule out PE or other etiology for the patient's symptoms.  Lopressor 5 mg administered.  Patient had not taken her toprol today.  Dr. Roxanne Mins to review patient's PE study for final disposition.  ED Discharge Orders    None       Charlesetta Shanks, MD 09/16/17 Laureen Abrahams

## 2017-09-16 ENCOUNTER — Emergency Department (HOSPITAL_COMMUNITY): Payer: PPO

## 2017-09-16 ENCOUNTER — Encounter (HOSPITAL_COMMUNITY): Payer: Self-pay

## 2017-09-16 DIAGNOSIS — R062 Wheezing: Secondary | ICD-10-CM | POA: Diagnosis not present

## 2017-09-16 DIAGNOSIS — R0600 Dyspnea, unspecified: Secondary | ICD-10-CM | POA: Diagnosis not present

## 2017-09-16 MED ORDER — IOPAMIDOL (ISOVUE-370) INJECTION 76%
INTRAVENOUS | Status: AC
  Filled 2017-09-16: qty 100

## 2017-09-16 MED ORDER — SODIUM CHLORIDE 0.9 % IJ SOLN
INTRAMUSCULAR | Status: AC
  Filled 2017-09-16: qty 50

## 2017-09-16 MED ORDER — IOPAMIDOL (ISOVUE-370) INJECTION 76%
100.0000 mL | Freq: Once | INTRAVENOUS | Status: AC | PRN
  Administered 2017-09-16: 100 mL via INTRAVENOUS

## 2017-09-16 MED ORDER — OSELTAMIVIR PHOSPHATE 75 MG PO CAPS: 75.0000 mg | ORAL_CAPSULE | Freq: Two times a day (BID) | ORAL | 0 refills | Status: DC

## 2017-09-16 NOTE — ED Provider Notes (Signed)
Patient signed out to me pending CT angiogram of the chest to rule out pulmonary embolism.  She has known history of lung cancer and presented with a flulike illness and dyspnea.  Also, she had a episode of supraventricular tachycardia earlier in the evening.  She had been given a dose of metoprolol.  CT angiogram shows no evidence of pulmonary embolism.  Changes are present suggesting possible spread of primary lung malignancy.  No definite pneumonia.  She has not had any further arrhythmias and I feel she is safe for discharge.  Influenza PCR screen was negative, but she is started on oseltamivir given her compromised pulmonary status.  Otherwise, continue symptomatic treatment.  Return precautions discussed.  Results for orders placed or performed during the hospital encounter of 09/15/17  Comprehensive metabolic panel  Result Value Ref Range   Sodium 135 135 - 145 mmol/L   Potassium 3.6 3.5 - 5.1 mmol/L   Chloride 91 (L) 101 - 111 mmol/L   CO2 33 (H) 22 - 32 mmol/L   Glucose, Bld 141 (H) 65 - 99 mg/dL   BUN 15 6 - 20 mg/dL   Creatinine, Ser 0.82 0.44 - 1.00 mg/dL   Calcium 9.3 8.9 - 10.3 mg/dL   Total Protein 7.4 6.5 - 8.1 g/dL   Albumin 3.4 (L) 3.5 - 5.0 g/dL   AST 18 15 - 41 U/L   ALT 12 (L) 14 - 54 U/L   Alkaline Phosphatase 82 38 - 126 U/L   Total Bilirubin 0.4 0.3 - 1.2 mg/dL   GFR calc non Af Amer >60 >60 mL/min   GFR calc Af Amer >60 >60 mL/min   Anion gap 11 5 - 15  CBC with Differential  Result Value Ref Range   WBC 7.7 4.0 - 10.5 K/uL   RBC 4.47 3.87 - 5.11 MIL/uL   Hemoglobin 11.9 (L) 12.0 - 15.0 g/dL   HCT 39.4 36.0 - 46.0 %   MCV 88.1 78.0 - 100.0 fL   MCH 26.6 26.0 - 34.0 pg   MCHC 30.2 30.0 - 36.0 g/dL   RDW 14.7 11.5 - 15.5 %   Platelets 426 (H) 150 - 400 K/uL   Neutrophils Relative % 76 %   Neutro Abs 5.8 1.7 - 7.7 K/uL   Lymphocytes Relative 13 %   Lymphs Abs 1.0 0.7 - 4.0 K/uL   Monocytes Relative 8 %   Monocytes Absolute 0.6 0.1 - 1.0 K/uL   Eosinophils  Relative 3 %   Eosinophils Absolute 0.2 0.0 - 0.7 K/uL   Basophils Relative 0 %   Basophils Absolute 0.0 0.0 - 0.1 K/uL  Influenza panel by PCR (type A & B)  Result Value Ref Range   Influenza A By PCR NEGATIVE NEGATIVE   Influenza B By PCR NEGATIVE NEGATIVE  I-stat troponin, ED  Result Value Ref Range   Troponin i, poc 0.00 0.00 - 0.08 ng/mL   Comment 3          I-Stat CG4 Lactic Acid, ED  Result Value Ref Range   Lactic Acid, Venous 0.87 0.5 - 1.9 mmol/L   Ct Angio Chest Pe W/cm &/or Wo Cm  Result Date: 09/16/2017 CLINICAL DATA:  Dyspnea and wheezing with body aches. EXAM: CT ANGIOGRAPHY CHEST WITH CONTRAST TECHNIQUE: Multidetector CT imaging of the chest was performed using the standard protocol during bolus administration of intravenous contrast. Multiplanar CT image reconstructions and MIPs were obtained to evaluate the vascular anatomy. CONTRAST:  169mL ISOVUE-370 IOPAMIDOL (ISOVUE-370)  INJECTION 76% COMPARISON:  07/13/2017 CT, PET-CT 08/11/2017 FINDINGS: Cardiovascular: Satisfactory opacification of the pulmonary arteries to the segmental level. No evidence of pulmonary embolism. Normal heart size. No pericardial effusion. Stable mild aortic atherosclerosis without dissection or aneurysm. Ectasia along the ascending thoracic aorta to 3.7 cm. Mediastinum/Nodes: Mild retroclavicular extension of the thyroid gland without dominant mass. Mildly enlarged but nonpathologic sized mediastinal lymph nodes, the largest is precarinal at 8 mm. Enlarged left hilar lymph node measuring up to 12 mm short axis may be secondary to reactive adenopathy secondary to airspace opacities in the left upper lobe. Lungs/Pleura: Slightly larger partially cavitary neoplasm in the posterior segment of right upper lobe measuring 3.2 x 3.8 cm currently versus 3.4 x 3.3 cm previously at the same level. Appears slightly larger along its caudal aspect measuring 3.1 cm versus 2.7 cm previously. Stable superior segment right  lower lobe satellite nodule measuring 5 mm with subpleural atelectasis adjacent to it. New ground-glass opacities outlining centrilobular emphysematous changes in the left upper lobe are noted suspicious for possible alveolitis or pneumonitis however there does appear to be a subtle nodular opacity within this area of ground-glass attenuation measuring 9 x 5 mm, series 9, image 39 for which contralateral metastatic disease is not excluded. Upper Abdomen: No acute abnormality. Musculoskeletal: No aggressive osseous lesions. Review of the MIP images confirms the above findings. IMPRESSION: 1. Slightly larger partly cavitary mass in the posterior segment of right upper lobe since prior exam and specially along the caudal aspect. As measured at the same level of from prior exam this mass measures 3.2 x 3.8 cm versus 3.4 x 3.3 cm previously. Along its caudal aspect however it appears larger measuring up to 3.1 cm versus 2.7 cm previously. 2. Stable superior segment right lower lobe nodule measuring 5 mm. 3. New ground-glass opacities in the anterior and lateral aspect of the left upper lobe with probable reactive left hilar adenopathy. This may represent an area of alveolitis or pneumonitis. However, it should be noted that within the ground-glass opacity is a subtle nodular density series 9/39 measuring 9 x 5 mm that may represent contralateral metastatic disease. Ground-glass opacities may represent concomitant alveolitis or pneumonitis versus early pneumonia but also can be seen in early adenocarcinoma in situ. 4. No acute pulmonary embolus. Aortic Atherosclerosis (ICD10-I70.0) and Emphysema (ICD10-J43.9). Electronically Signed   By: Ashley Royalty M.D.   On: 09/16/2017 01:15   Dg Chest Port 1 View  Result Date: 09/15/2017 CLINICAL DATA:  Shortness of breath EXAM: PORTABLE CHEST 1 VIEW COMPARISON:  PET-CT 08/11/2017, radiograph 07/27/2017 FINDINGS: Hyperinflation. Right upper lobe lung mass. No acute consolidation  otherwise. Stable cardiomediastinal silhouette with aortic atherosclerosis. No pneumothorax. IMPRESSION: 1. Right upper lobe lung mass as before 2. Otherwise no acute interval changes. Electronically Signed   By: Donavan Foil M.D.   On: 16/03/9603 54:09      Delora Fuel, MD 81/19/14 [phone]

## 2017-09-16 NOTE — Discharge Instructions (Addendum)
Your CT scan did not show any signs of blood clots.  There is no evidence of pneumonia on x-ray or CT scan.  Please make sure to drink plenty of fluids.  Take acetaminophen or ibuprofen as needed for fever or aching.  Your swab for influenza a was negative, but there are false negatives with this test.  You are being given a prescription for oseltamivir to treat possible influenza.  Return to the emergency department if symptoms are getting worse.

## 2017-09-21 LAB — CULTURE, BLOOD (ROUTINE X 2)
CULTURE: NO GROWTH
Special Requests: ADEQUATE

## 2017-10-11 ENCOUNTER — Other Ambulatory Visit: Payer: Self-pay

## 2017-10-11 ENCOUNTER — Encounter: Payer: Self-pay | Admitting: Radiation Oncology

## 2017-10-11 ENCOUNTER — Ambulatory Visit
Admission: RE | Admit: 2017-10-11 | Discharge: 2017-10-11 | Disposition: A | Discharge status: Home / Self Care | Payer: PPO | Source: Ambulatory Visit | Attending: Radiation Oncology | Admitting: Radiation Oncology

## 2017-10-11 VITALS — BP 132/70 | HR 110 | Temp 98.2°F | Resp 20 | Ht 61.0 in | Wt 100.0 lb

## 2017-10-11 DIAGNOSIS — C3411 Malignant neoplasm of upper lobe, right bronchus or lung: Secondary | ICD-10-CM

## 2017-10-12 ENCOUNTER — Other Ambulatory Visit: Payer: Self-pay | Admitting: Radiation Oncology

## 2017-10-12 DIAGNOSIS — C3400 Malignant neoplasm of unspecified main bronchus: Secondary | ICD-10-CM

## 2017-10-13 NOTE — Progress Notes (Signed)
Radiation Oncology         [phone]) [phone] ________________________________  Name: Chelsea Baker MRN: [id number]  Date of Service: 10/11/2017 DOB: [birth year]  Post Treatment Note  CC: Esaw Grandchild, NP  [name], MD  Diagnosis:   Stage IB, cT2aN0M0, NSCLC, squamous cell carcinoma of the right upper lobe.  Interval Since Last Radiation:  5 weeks   09/01/17-09/10/17 SBRT Treatment:  Rt upper lobe lung treated to 60 Gy in 5 fractions  Narrative:  The patient returns today for routine follow-up. The patient has done well during radiotherapy and did not have any untoward side effects.                          On review of systems, the patient states she's doing really well. She is not having any shortness of breath, fevers, or chills. She denies any oxygen needs at this time and reports this has drastically improved since starting treatment.  ALLERGIES:  has No Known Allergies.  Meds: Current Outpatient Medications  Medication Sig Dispense Refill  . acetaminophen (TYLENOL) 500 MG tablet Take 500-1,000 mg by mouth every 6 (six) hours as needed for mild pain, moderate pain or headache.    . ipratropium-albuterol (DUONEB) 0.5-2.5 (3) MG/3ML SOLN Take 3 mLs by nebulization every 4 (four) hours as needed. (Patient taking differently: Take 3 mLs by nebulization every 4 (four) hours as needed (sob). ) 360 mL 3  . metoprolol succinate (TOPROL-XL) 50 MG 24 hr tablet Take 1 tablet (50 mg total) by mouth daily. Take with or immediately following a meal. 90 tablet 3  . albuterol (PROVENTIL HFA;VENTOLIN HFA) 108 (90 Base) MCG/ACT inhaler Inhale 2 puffs into the lungs every 4 (four) hours as needed for wheezing or shortness of breath. (Patient not taking: Reported on 10/11/2017) 1 Inhaler 0  . oseltamivir (TAMIFLU) 75 MG capsule Take 1 capsule (75 mg total) by mouth every 12 (twelve) hours. (Patient not taking: Reported on 10/11/2017) 10 capsule 0   No current facility-administered  medications for this encounter.     Physical Findings:  height is 5\' 1"  (1.549 m) and weight is 100 lb (45.4 kg). Her oral temperature is 98.2 F (36.8 C). Her blood pressure is 132/70 and her pulse is 110 (abnormal). Her respiration is 20 and oxygen saturation is 96%.  Pain Assessment Pain Score: 4  Pain Loc: Hip(Bilateral hips)/10 In general this is a well appearing caucasian female in no acute distress. She's alert and oriented x4 and appropriate throughout the examination. Cardiopulmonary assessment is negative for acute distress and she exhibits normal effort.   Lab Findings: Lab Results  Component Value Date   WBC 7.7 09/15/2017   HGB 11.9 (L) 09/15/2017   HCT 39.4 09/15/2017   MCV 88.1 09/15/2017   PLT 426 (H) 09/15/2017     Radiographic Findings: Ct Angio Chest Pe W/cm &/or Wo Cm  Result Date: 09/16/2017 CLINICAL DATA:  Dyspnea and wheezing with body aches. EXAM: CT ANGIOGRAPHY CHEST WITH CONTRAST TECHNIQUE: Multidetector CT imaging of the chest was performed using the standard protocol during bolus administration of intravenous contrast. Multiplanar CT image reconstructions and MIPs were obtained to evaluate the vascular anatomy. CONTRAST:  166mL ISOVUE-370 IOPAMIDOL (ISOVUE-370) INJECTION 76% COMPARISON:  07/13/2017 CT, PET-CT 08/11/2017 FINDINGS: Cardiovascular: Satisfactory opacification of the pulmonary arteries to the segmental level. No evidence of pulmonary embolism. Normal heart size. No pericardial effusion. Stable mild aortic atherosclerosis without dissection or aneurysm.  Ectasia along the ascending thoracic aorta to 3.7 cm. Mediastinum/Nodes: Mild retroclavicular extension of the thyroid gland without dominant mass. Mildly enlarged but nonpathologic sized mediastinal lymph nodes, the largest is precarinal at 8 mm. Enlarged left hilar lymph node measuring up to 12 mm short axis may be secondary to reactive adenopathy secondary to airspace opacities in the left upper lobe.  Lungs/Pleura: Slightly larger partially cavitary neoplasm in the posterior segment of right upper lobe measuring 3.2 x 3.8 cm currently versus 3.4 x 3.3 cm previously at the same level. Appears slightly larger along its caudal aspect measuring 3.1 cm versus 2.7 cm previously. Stable superior segment right lower lobe satellite nodule measuring 5 mm with subpleural atelectasis adjacent to it. New ground-glass opacities outlining centrilobular emphysematous changes in the left upper lobe are noted suspicious for possible alveolitis or pneumonitis however there does appear to be a subtle nodular opacity within this area of ground-glass attenuation measuring 9 x 5 mm, series 9, image 39 for which contralateral metastatic disease is not excluded. Upper Abdomen: No acute abnormality. Musculoskeletal: No aggressive osseous lesions. Review of the MIP images confirms the above findings. IMPRESSION: 1. Slightly larger partly cavitary mass in the posterior segment of right upper lobe since prior exam and specially along the caudal aspect. As measured at the same level of from prior exam this mass measures 3.2 x 3.8 cm versus 3.4 x 3.3 cm previously. Along its caudal aspect however it appears larger measuring up to 3.1 cm versus 2.7 cm previously. 2. Stable superior segment right lower lobe nodule measuring 5 mm. 3. New ground-glass opacities in the anterior and lateral aspect of the left upper lobe with probable reactive left hilar adenopathy. This may represent an area of alveolitis or pneumonitis. However, it should be noted that within the ground-glass opacity is a subtle nodular density series 9/39 measuring 9 x 5 mm that may represent contralateral metastatic disease. Ground-glass opacities may represent concomitant alveolitis or pneumonitis versus early pneumonia but also can be seen in early adenocarcinoma in situ. 4. No acute pulmonary embolus. Aortic Atherosclerosis (ICD10-I70.0) and Emphysema (ICD10-J43.9).  Electronically Signed   By: Ashley Royalty M.D.   On: 09/16/2017 01:15   Dg Chest Port 1 View  Result Date: 09/15/2017 CLINICAL DATA:  Shortness of breath EXAM: PORTABLE CHEST 1 VIEW COMPARISON:  PET-CT 08/11/2017, radiograph 07/27/2017 FINDINGS: Hyperinflation. Right upper lobe lung mass. No acute consolidation otherwise. Stable cardiomediastinal silhouette with aortic atherosclerosis. No pneumothorax. IMPRESSION: 1. Right upper lobe lung mass as before 2. Otherwise no acute interval changes. Electronically Signed   By: Donavan Foil M.D.   On: 09/15/2017 22:38    Impression/Plan: 1. Stage IB, cT2aN0M0, NSCLC, squamous cell carcinoma of the right upper lobe. The patient is ready to proceed with reimaging of her chest. She will also continue to follow up with Dr. [name]. I will review her CT imaging once this is performed by phone, and see her back as needed thereafter as she will follow with medical oncology.        Carola Rhine, PAC

## 2017-10-18 ENCOUNTER — Telehealth: Payer: Self-pay | Admitting: *Deleted

## 2017-10-18 NOTE — Telephone Encounter (Signed)
CALLED PATIENT TO INFORM OF CT FOR 10-21-17 - ARRIVAL TIME - 12:15 PM @ [facility], PT. TO HAVE WATER ONLY -4 HRS. PRIOR TO TEST, ALISON PERKINS TO FU WITH PATIENT BY PHONE FOR RESULTS, SPOKE WITH PATIENT AND SHE IS AWARE OF THIS TEST

## 2017-10-21 ENCOUNTER — Encounter (HOSPITAL_COMMUNITY): Payer: Self-pay

## 2017-10-21 ENCOUNTER — Ambulatory Visit (HOSPITAL_COMMUNITY)
Admission: RE | Admit: 2017-10-21 | Discharge: 2017-10-21 | Disposition: A | Discharge status: Home / Self Care | Payer: PPO | Source: Ambulatory Visit | Attending: Radiation Oncology | Admitting: Radiation Oncology

## 2017-10-21 DIAGNOSIS — R911 Solitary pulmonary nodule: Secondary | ICD-10-CM | POA: Insufficient documentation

## 2017-10-21 DIAGNOSIS — C3411 Malignant neoplasm of upper lobe, right bronchus or lung: Secondary | ICD-10-CM | POA: Diagnosis not present

## 2017-10-21 DIAGNOSIS — J432 Centrilobular emphysema: Secondary | ICD-10-CM | POA: Diagnosis not present

## 2017-10-21 DIAGNOSIS — I700 Atherosclerosis of aorta: Secondary | ICD-10-CM | POA: Insufficient documentation

## 2017-10-21 DIAGNOSIS — R918 Other nonspecific abnormal finding of lung field: Secondary | ICD-10-CM | POA: Insufficient documentation

## 2017-10-21 DIAGNOSIS — C3400 Malignant neoplasm of unspecified main bronchus: Secondary | ICD-10-CM | POA: Diagnosis not present

## 2017-10-21 DIAGNOSIS — J438 Other emphysema: Secondary | ICD-10-CM | POA: Diagnosis not present

## 2017-10-21 MED ORDER — IOHEXOL 300 MG/ML  SOLN
75.0000 mL | Freq: Once | INTRAMUSCULAR | Status: AC | PRN
  Administered 2017-10-21: 75 mL via INTRAVENOUS

## 2017-10-22 ENCOUNTER — Telehealth: Payer: Self-pay | Admitting: Radiation Oncology

## 2017-10-22 NOTE — Telephone Encounter (Signed)
LM for the patient to call me to review her CT imaging.

## 2017-11-10 NOTE — Progress Notes (Signed)
  Radiation Oncology         [phone]) [phone] ________________________________  Name: Chelsea Baker MRN: [id number]  Date: 08/24/2017  DOB: 1945-08-03  RESPIRATORY MOTION MANAGEMENT SIMULATION  NARRATIVE:  In order to account for effect of respiratory motion on target structures and other organs in the planning and delivery of radiotherapy, this patient underwent respiratory motion management simulation.  To accomplish this, when the patient was brought to the CT simulation planning suite, 4D respiratoy motion management CT images were obtained.  The CT images were loaded into the planning software.  Then, using a variety of tools including Cine, MIP, and standard views, the target volume and planning target volumes (PTV) were delineated.  Avoidance structures were contoured.  Treatment planning then occurred.  Dose volume histograms were generated and reviewed for each of the requested structure.  The resulting plan was carefully reviewed and approved today.   ------------------------------------------------  Jodelle Gross, MD, PhD

## 2017-11-10 NOTE — Progress Notes (Signed)
  Radiation Oncology         [phone]) [phone] ________________________________  Name: Chelsea Baker MRN: [id number]  Date: 09/10/2017  DOB: 24-Oct-1945  End of Treatment Note  Diagnosis:   Non-small cell lung cancer     Indication for treatment:  Curative       Radiation treatment dates:   09/01/2017 through 09/10/2017  Site/dose:   The tumor in the right lung was treated with a course of stereotactic body radiation treatment. The patient received 60 gy In 5 fractions at 12 G per fraction.  Narrative: The patient tolerated radiation treatment relatively well.   The patient did not have any signs of acute toxicity during treatment.  Plan: The patient has completed radiation treatment. The patient will return to radiation oncology clinic for routine followup in one month. I advised the patient to call or return sooner if they have any questions or concerns related to their recovery or treatment.   ------------------------------------------------  Jodelle Gross, MD, PhD

## 2017-11-10 NOTE — Addendum Note (Signed)
Encounter addended by: Kyung Rudd, MD on: 11/10/2017 6:48 PM  Actions taken: Sign clinical note

## 2017-11-10 NOTE — Progress Notes (Addendum)
[facility] Simulation and Treatment Planning Note   Name:  Chelsea Baker MRN: [id number]   Date: 11/10/2017  DOB: [birth year]  Status:outpatient    DIAGNOSIS:    ICD-10-CM   1. Malignant neoplasm of right upper lobe of lung ([place]) C34.11      CONSENT VERIFIED:yes   SET UP: Patient is setup supine   IMMOBILIZATION: The patient was immobilized using a customized Vac Loc bag/ blue bag and customized accuform device   NARRATIVE:The patient was brought to the [facility].  Identity was confirmed.  All relevant records and images related to the planned course of therapy were reviewed.  Then, the patient was positioned in a stable reproducible clinical set-up for radiation therapy. Abdominal compression was applied by me.  4D CT images were obtained and reproducible breathing pattern was confirmed. Free breathing CT images were obtained.  Skin markings were placed.  The CT images were loaded into the planning software where the target and avoidance structures were contoured.  The radiation prescription was entered and confirmed.    TREATMENT PLANNING NOTE:  Treatment planning then occurred. I have requested : IMRT planning.This treatment technique is medically necessary due to the high-dose of radiation delivered to the target region which is in close proximity to adjacent critical normal structures.  3 dimensional simulation is performed and dose volume histogram of the gross tumor volume, planning tumor volume and criticial normal structures including the spinal cord and lungs were analyzed and requested.  Special treatment procedure was performed due to high dose per fraction.  The patient will be monitored for increased risk of toxicity.  Daily imaging using cone beam CT will be used for target localization.  I anticipate that the patient will receive 60 Gy in 5 fractions to target volume. Further adjustments will be made based on  the planning process is necessary.  ------------------------------------------------  Jodelle Gross, MD, PhD

## 2017-11-11 ENCOUNTER — Encounter: Payer: Self-pay | Admitting: *Deleted

## 2017-11-15 ENCOUNTER — Encounter: Payer: Self-pay | Admitting: Radiation Oncology

## 2017-11-15 ENCOUNTER — Encounter: Payer: Self-pay | Admitting: Adult Health

## 2017-11-15 ENCOUNTER — Other Ambulatory Visit: Payer: Self-pay

## 2017-11-15 ENCOUNTER — Other Ambulatory Visit: Payer: PPO

## 2017-11-15 DIAGNOSIS — E871 Hypo-osmolality and hyponatremia: Secondary | ICD-10-CM

## 2017-11-15 DIAGNOSIS — I10 Essential (primary) hypertension: Secondary | ICD-10-CM | POA: Diagnosis not present

## 2017-11-15 DIAGNOSIS — Z0000 Encounter for general adult medical examination without abnormal findings: Secondary | ICD-10-CM

## 2017-11-16 ENCOUNTER — Other Ambulatory Visit: Payer: Self-pay | Admitting: Adult Health

## 2017-11-16 ENCOUNTER — Encounter: Payer: Self-pay | Admitting: *Deleted

## 2017-11-16 DIAGNOSIS — E785 Hyperlipidemia, unspecified: Secondary | ICD-10-CM

## 2017-11-16 DIAGNOSIS — Z79899 Other long term (current) drug therapy: Secondary | ICD-10-CM

## 2017-11-16 LAB — CBC WITH DIFFERENTIAL/PLATELET
BASOS ABS: 0 10*3/uL (ref 0.0–0.2)
BASOS: 1 %
EOS (ABSOLUTE): 0.1 10*3/uL (ref 0.0–0.4)
EOS: 3 %
HEMATOCRIT: 37.2 % (ref 34.0–46.6)
Hemoglobin: 11.6 g/dL (ref 11.1–15.9)
Immature Grans (Abs): 0 10*3/uL (ref 0.0–0.1)
Immature Granulocytes: 0 %
LYMPHS ABS: 1.1 10*3/uL (ref 0.7–3.1)
Lymphs: 29 %
MCH: 25.2 pg — AB (ref 26.6–33.0)
MCHC: 31.2 g/dL — ABNORMAL LOW (ref 31.5–35.7)
MCV: 81 fL (ref 79–97)
MONOCYTES: 8 %
MONOS ABS: 0.3 10*3/uL (ref 0.1–0.9)
Neutrophils Absolute: 2.3 10*3/uL (ref 1.4–7.0)
Neutrophils: 59 %
Platelets: 336 10*3/uL (ref 150–450)
RBC: 4.61 x10E6/uL (ref 3.77–5.28)
RDW: 15.8 % — AB (ref 12.3–15.4)
WBC: 3.9 10*3/uL (ref 3.4–10.8)

## 2017-11-16 LAB — COMPREHENSIVE METABOLIC PANEL
ALBUMIN: 4.1 g/dL (ref 3.5–4.8)
ALT: 15 IU/L (ref 0–32)
AST: 24 IU/L (ref 0–40)
Albumin/Globulin Ratio: 1.7 (ref 1.2–2.2)
Alkaline Phosphatase: 93 IU/L (ref 39–117)
BUN / CREAT RATIO: 16 (ref 12–28)
BUN: 13 mg/dL (ref 8–27)
Bilirubin Total: 0.2 mg/dL (ref 0.0–1.2)
CO2: 22 mmol/L (ref 20–29)
CREATININE: 0.79 mg/dL (ref 0.57–1.00)
Calcium: 9.6 mg/dL (ref 8.7–10.3)
Chloride: 98 mmol/L (ref 96–106)
GFR, EST AFRICAN AMERICAN: 86 mL/min/{1.73_m2} (ref >59)
GFR, EST NON AFRICAN AMERICAN: 75 mL/min/{1.73_m2} (ref >59)
GLOBULIN, TOTAL: 2.4 g/dL (ref 1.5–4.5)
GLUCOSE: 85 mg/dL (ref 65–99)
Potassium: 4.9 mmol/L (ref 3.5–5.2)
SODIUM: 136 mmol/L (ref 134–144)
TOTAL PROTEIN: 6.5 g/dL (ref 6.0–8.5)

## 2017-11-16 LAB — TSH: TSH: 1.77 u[IU]/mL (ref 0.450–4.500)

## 2017-11-16 LAB — LIPID PANEL
CHOL/HDL RATIO: 3.9 ratio (ref 0.0–4.4)
CHOLESTEROL TOTAL: 241 mg/dL — AB (ref 100–199)
HDL: 62 mg/dL (ref >39)
LDL Calculated: 132 mg/dL — ABNORMAL HIGH (ref 0–99)
TRIGLYCERIDES: 234 mg/dL — AB (ref 0–149)
VLDL Cholesterol Cal: 47 mg/dL — ABNORMAL HIGH (ref 5–40)

## 2017-11-16 LAB — HEMOGLOBIN A1C
Est. average glucose Bld gHb Est-mCnc: 114 mg/dL
Hgb A1c MFr Bld: 5.6 % (ref 4.8–5.6)

## 2017-11-16 MED ORDER — ATORVASTATIN CALCIUM 20 MG PO TABS: 20.0000 mg | ORAL_TABLET | Freq: Every day | ORAL | 3 refills | Status: DC

## 2017-11-16 NOTE — Progress Notes (Signed)
[id number] Work  release letter faxed to Chelsea Baker' employer. Chelsea Baker to make her aware that the letter was faxed to Chelsea Baker this morning.

## 2017-11-23 ENCOUNTER — Ambulatory Visit: Payer: Self-pay | Admitting: Adult Health

## 2017-11-25 ENCOUNTER — Encounter: Payer: Self-pay | Admitting: Adult Health

## 2017-11-30 ENCOUNTER — Telehealth: Payer: Self-pay | Admitting: Hematology

## 2017-11-30 NOTE — Telephone Encounter (Signed)
Called patient regarding 6/11

## 2017-12-06 NOTE — Progress Notes (Signed)
HEMATOLOGY/ONCOLOGY CONSULTATION NOTE  Date of Service: 12/07/2017  Patient Care Team: Esaw Grandchild, NP as PCP - General (Family Medicine)  CHIEF COMPLAINTS/PURPOSE OF CONSULTATION:   F/u for recently diagnosed squamous cell lung cancer   HISTORY OF PRESENTING ILLNESS:    Chelsea Baker is a wonderful 72 y.o. female who has been referred to [place] by Dr .Sloan Leiter, Henreitta Leber, MD  for evaluation and management of newly diagnosed Squmous cell carcinoma of the RUL.  Patient has a history of hypertension, nicotine dependence, GERD, hiatal hernia, AAA and presented with a 2-week history of cough, weight loss about 20 pounds in about 6 months and increasing shortness of breath over the last several days prior to admission.  In the ED the patient had a chest x-ray which showed a 3.4 cm Which showed a 3.4 cm right upper lobe mass which appeared cavitary.  Subsequent CT of the chest showed a 3.5 cm cavitary mass involving the right upper lobe concerning for a primary bronchogenic carcinoma.  She was also noted to have a 4 mm nodule which is indeterminate in the right lower lobe.  No evidence of metastatic disease noted in the chest or upper abdomen. No evidence of pulmonary embolism.  Patient subsequently had a CT-guided biopsy of the right upper lobe lung lesion on 07/15/2017.  Pathology revealed squamous cell carcinoma of the lung. She had other workup including an AFB smear which was negative.  Patient notes that she does not really have a primary care physician and has not had good medical follow-up and does not really see a doctor regularly. She has had a history of smoking 1/2-2 packs/day for 50 years.  No diagnosed COPD though likely has significant decline in lung function at baseline. Currently was short of breath despite lung cancer not involving a large part of the lung at this time and no evidence of PE.  Still needing 2-3 L of oxygen by nasal cannula currently and endorses  shortness of breath with minimal walking.  She notes that she lives with her son.   INTERVAL HISTORY   Chelsea Baker is here regarding her lung cancer. The patient's last visit with [place] was on 08/12/17. The pt reports that she is doing well overall.   The pt reports that she has continued with her smoking cessation since her last visit. She notes that her coughing has reduced and she uses a nebulizer as needed when she feels SOB. She adds that she uses a nebulizer once or twice a day. She walks about 5 miles a day at work and is planning on going to the gym soon. She adds that her appetite has increased and she has gained some weight.   Of note since the patient's last visit, pt has had CT Chest completed on 10/22/17 with results revealing 1. Interval decrease in size of right upper lobe nodule. Unchanged right lower lobe nodule. 2. Persistent 1 cm nodule within the lingula, potentially infectious/inflammatory in etiology. Recommend attention on follow-up. 3. Interval improvement/resolution of previously described patchy ground-glass opacities within the left lung, most compatible with resolved infectious/inflammatory process. 4. Aortic Atherosclerosis (ICD10-I70.0) and Emphysema (ICD10-J43.9).  Lab results today (12/07/17) of CBC, CMP, and Reticulocytes is as follows: all values are WNL except for MCHC at 30.9, RDW at 15.6, Total Bilirubin at <0.2.  On review of systems, pt reports good energy levels, increased appetite, reduced coughing, occasional SOB, and denies headaches, abdominal pains, leg swelling, and any other symptoms.  MEDICAL HISTORY:  Past Medical History:  Diagnosis Date  . AAA (abdominal aortic aneurysm) ([place])   . Diverticulosis   . GERD (gastroesophageal reflux disease)   . Hiatal hernia   . Hypertension   . Hypokalemia   . Lumbar spondylolysis   . Lung cancer ([place])   . Lung tumor   . On home O2    2L N/C    SURGICAL HISTORY: Past Surgical History:  Procedure  Laterality Date  . ABDOMINAL AORTIC ANEURYSM REPAIR  10/01/10   endostent repair  . CATARACT EXTRACTION, BILATERAL    . LUNG BIOPSY    . NM MYOVIEW LTD  2012   Normal Lexiscan Myoview with no ischemia or infarction.  Normal LV function.  EF 67%.  . TONSILLECTOMY    . TRANSTHORACIC ECHOCARDIOGRAM  07/17/2017   EF 35-40%.  Septal mid and basal inferior wall hypokinesis.  Mildly dilated LV.  Mildly reduced EF.  GR 1 DD.  Calcified mitral annulus.     SOCIAL HISTORY: Social History   Socioeconomic History  . Marital status: Single    Spouse name: Not on file  . Number of children: Not on file  . Years of education: Not on file  . Highest education level: Not on file  Occupational History  . Not on file  Social Needs  . Financial resource strain: Not on file  . Food insecurity:    Worry: Not on file    Inability: Not on file  . Transportation needs:    Medical: Not on file    Non-medical: Not on file  Tobacco Use  . Smoking status: Former Smoker    Packs/day: 1.00    Years: 50.00    Pack years: 50.00    Types: Cigarettes    Last attempt to quit: 06/2017    Years since quitting: 0.4  . Smokeless tobacco: Never Used  Substance and Sexual Activity  . Alcohol use: Yes    Comment: rarely  . Drug use: No  . Sexual activity: Not Currently  Lifestyle  . Physical activity:    Days per week: Not on file    Minutes per session: Not on file  . Stress: Not on file  Relationships  . Social connections:    Talks on phone: Not on file    Gets together: Not on file    Attends religious service: Not on file    Active member of club or organization: Not on file    Attends meetings of clubs or organizations: Not on file    Relationship status: Not on file  . Intimate partner violence:    Fear of current or ex partner: Not on file    Emotionally abused: Not on file    Physically abused: Not on file    Forced sexual activity: Not on file  Other Topics Concern  . Not on file    Social History Narrative  . Not on file    FAMILY HISTORY: Family History  Problem Relation Age of Onset  . Heart failure Mother   . Heart attack Mother 32       In 78s - 1st MI  . Coronary artery disease Mother   . Coronary artery disease Father        Died at age 49  . Heart attack Father 56  . Heart disease Maternal Grandmother   . Heart disease Maternal Grandfather   . Cancer Neg Hx     ALLERGIES:  has No Known Allergies.  MEDICATIONS:  Current Outpatient Medications  Medication Sig Dispense Refill  . acetaminophen (TYLENOL) 500 MG tablet Take 500-1,000 mg by mouth every 6 (six) hours as needed for mild pain, moderate pain or headache.    . albuterol (PROVENTIL HFA;VENTOLIN HFA) 108 (90 Base) MCG/ACT inhaler Inhale 2 puffs into the lungs every 4 (four) hours as needed for wheezing or shortness of breath. (Patient not taking: Reported on 10/11/2017) 1 Inhaler 0  . atorvastatin (LIPITOR) 20 MG tablet Take 1 tablet (20 mg total) by mouth daily. 90 tablet 3  . ipratropium-albuterol (DUONEB) 0.5-2.5 (3) MG/3ML SOLN Take 3 mLs by nebulization every 4 (four) hours as needed. (Patient taking differently: Take 3 mLs by nebulization every 4 (four) hours as needed (sob). ) 360 mL 3  . metoprolol succinate (TOPROL-XL) 50 MG 24 hr tablet Take 1 tablet (50 mg total) by mouth daily. Take with or immediately following a meal. 90 tablet 3  . oseltamivir (TAMIFLU) 75 MG capsule Take 1 capsule (75 mg total) by mouth every 12 (twelve) hours. (Patient not taking: Reported on 10/11/2017) 10 capsule 0   No current facility-administered medications for this visit.     REVIEW OF SYSTEMS:    A 10+ POINT REVIEW OF SYSTEMS WAS OBTAINED including neurology, dermatology, psychiatry, cardiac, respiratory, lymph, extremities, GI, GU, Musculoskeletal, constitutional, breasts, reproductive, HEENT.  All pertinent positives are noted in the HPI.  All others are negative.   PHYSICAL EXAMINATION: ECOG  PERFORMANCE STATUS: 3 - Symptomatic, >50% confined to bed  . Vitals:   12/07/17 1340  BP: (!) 146/92  Pulse: 84  Resp: 17  Temp: 98.4 F (36.9 C)  SpO2: 98%   Filed Weights   12/07/17 1340  Weight: 107 lb 14.4 oz (48.9 kg)   .Body mass index is 20.39 kg/m. NAD On [place] 2L/min O2 GENERAL:alert, in no acute distress and comfortable SKIN: no acute rashes, no significant lesions EYES: conjunctiva are pink and non-injected, sclera anicteric OROPHARYNX: MMM, no exudates, no oropharyngeal erythema or ulceration NECK: supple, no JVD LYMPH:  no palpable lymphadenopathy in the cervical, axillary or inguinal regions LUNGS: b/l decreased air entry and scattered rhonchi  HEART: regular rate & rhythm ABDOMEN:  normoactive bowel sounds , non tender, not distended. Extremity: no pedal edema PSYCH: alert & oriented x 3 with fluent speech NEURO: no focal motor/sensory deficits   LABORATORY DATA:  I have reviewed the data as listed  . CBC Latest Ref Rng & Units 12/07/2017 11/15/2017 09/15/2017  WBC 3.9 - 10.3 K/uL 5.8 3.9 7.7  Hemoglobin 11.6 - 15.9 g/dL 12.1 11.6 11.9(L)  Hematocrit 34.8 - 46.6 % 39.2 37.2 39.4  Platelets 145 - 400 K/uL 274 336 426(H)    . CMP Latest Ref Rng & Units 12/07/2017 11/15/2017 09/15/2017  Glucose 70 - 140 mg/dL 113 85 141(H)  BUN 7 - 26 mg/dL 16 13 15   Creatinine 0.60 - 1.10 mg/dL 0.87 0.79 0.82  Sodium 136 - 145 mmol/L 137 136 135  Potassium 3.5 - 5.1 mmol/L 3.9 4.9 3.6  Chloride 98 - 109 mmol/L 104 98 91(L)  CO2 22 - 29 mmol/L 26 22 33(H)  Calcium 8.4 - 10.4 mg/dL 9.7 9.6 9.3  Total Protein 6.4 - 8.3 g/dL 6.9 6.5 7.4  Total Bilirubin 0.2 - 1.2 mg/dL <0.2(L) 0.2 0.4  Alkaline Phos 40 - 150 U/L 104 93 82  AST 5 - 34 U/L 25 24 18   ALT 0 - 55 U/L 23 15 12(L)    PATHOLOGY  RADIOGRAPHIC STUDIES: I have personally reviewed the radiological images as listed and agreed with the findings in the report. No results found.  ASSESSMENT & PLAN:    Chelsea Baker is a 72 y.o. female with    1) Newly diagnosed right upper lobe squamous cell carcinoma of the lung. Stage I Presenting with a cavitary RUL mass.  Biopsy proven to be squamous cell carcinoma. No overt mediastinal or hilar lymphadenopathy noted. PET/CT showed no overt metastatic disease CT head neg for mets. Patient declined MRI PLAN -Discussed pt labwork today, 12/07/17; blood counts and chemistries are stable. -CT Chest 10/22/17 shows improvement in the right upper lobe mass after SRS -Will repeat lung function testing to rule out any obstructive elements -Will repeat CT Chest in August -Advised that pt continue staying active, hydrated, and well nourished -Will consider steroid inhaler pending lung function results -Will see pt back after CT Chest in August   2) Dyspnea and rest and on minimal exertion. - much improved CT chest with no PE  significant COPD at baseline -  Needing 2-3 L of oxygen by nasal cannula in hospital. and is currently using 2L/min [place] at home ECHO ef 35-40% PLAN  -Pulmonary function testing -severe obstruction - optimize rx -noted to have systolic cardiomyopathy - mx Dr Ellyn Hack. -optimize mx of COPD with PCP/pulmonologist.  3) Poor medical f/u. Past Medical History:  Diagnosis Date  . AAA (abdominal aortic aneurysm) ([place])   . Diverticulosis   . GERD (gastroesophageal reflux disease)   . Hiatal hernia   . Hypertension   . Hypokalemia   . Lumbar spondylolysis   . Lung cancer ([place])   . Lung tumor   . On home O2    2L N/C   4) Protein calorie malnutrition -nutritional consultation  5) Heavy smoker 75-100 pk-yrs -Discussed smoking cessation-in details  -Pt reports today 08/12/17 that she has completely quit smoking and plans to never smoke again.  -Pt has continued smoking cessation as of 12/07/17    CT chest in 12 weeks RTC with Dr [name] in 3 months with labs    All of the patients questions were answered with apparent  satisfaction. The patient knows to call the clinic with any problems, questions or concerns.  The total time spent in the appt was 25 minutes and more than 50% was on counseling and direct patient cares.     Sullivan Lone MD [place] AAHIVMS [facility] Hematology/Oncology Physician [facility]  (Office):       [phone] (Work cell):  [phone] (Fax):           [phone]  I, Baldwin [place], am acting as a scribe for Dr [name].   .I have reviewed the above documentation for accuracy and completeness, and I agree with the above. [name] MD

## 2017-12-07 ENCOUNTER — Telehealth: Payer: Self-pay | Admitting: Hematology

## 2017-12-07 ENCOUNTER — Inpatient Hospital Stay: Payer: PPO | Attending: Hematology | Admitting: Hematology

## 2017-12-07 ENCOUNTER — Other Ambulatory Visit: Payer: Self-pay

## 2017-12-07 ENCOUNTER — Ambulatory Visit: Payer: Self-pay | Admitting: Hematology

## 2017-12-07 ENCOUNTER — Inpatient Hospital Stay: Payer: PPO

## 2017-12-07 VITALS — BP 146/92 | HR 84 | Temp 98.4°F | Resp 17 | Ht 61.0 in | Wt 107.9 lb

## 2017-12-07 DIAGNOSIS — I10 Essential (primary) hypertension: Secondary | ICD-10-CM | POA: Insufficient documentation

## 2017-12-07 DIAGNOSIS — I429 Cardiomyopathy, unspecified: Secondary | ICD-10-CM

## 2017-12-07 DIAGNOSIS — C3411 Malignant neoplasm of upper lobe, right bronchus or lung: Secondary | ICD-10-CM | POA: Diagnosis not present

## 2017-12-07 DIAGNOSIS — K219 Gastro-esophageal reflux disease without esophagitis: Secondary | ICD-10-CM | POA: Insufficient documentation

## 2017-12-07 DIAGNOSIS — J449 Chronic obstructive pulmonary disease, unspecified: Secondary | ICD-10-CM

## 2017-12-07 LAB — CMP (CANCER CENTER ONLY)
ALT: 23 U/L (ref 0–55)
ANION GAP: 7 (ref 3–11)
AST: 25 U/L (ref 5–34)
Albumin: 3.9 g/dL (ref 3.5–5.0)
Alkaline Phosphatase: 104 U/L (ref 40–150)
BUN: 16 mg/dL (ref 7–26)
CO2: 26 mmol/L (ref 22–29)
Calcium: 9.7 mg/dL (ref 8.4–10.4)
Chloride: 104 mmol/L (ref 98–109)
Creatinine: 0.87 mg/dL (ref 0.60–1.10)
GFR, Est AFR Am: >60 mL/min (ref >60)
GFR, Estimated: >60 mL/min (ref >60)
GLUCOSE: 113 mg/dL (ref 70–140)
POTASSIUM: 3.9 mmol/L (ref 3.5–5.1)
Sodium: 137 mmol/L (ref 136–145)
Total Protein: 6.9 g/dL (ref 6.4–8.3)

## 2017-12-07 LAB — CBC WITH DIFFERENTIAL (CANCER CENTER ONLY)
BASOS ABS: 0 10*3/uL (ref 0.0–0.1)
Basophils Relative: 0 %
EOS PCT: 2 %
Eosinophils Absolute: 0.1 10*3/uL (ref 0.0–0.5)
HEMATOCRIT: 39.2 % (ref 34.8–46.6)
Hemoglobin: 12.1 g/dL (ref 11.6–15.9)
LYMPHS ABS: 1.3 10*3/uL (ref 0.9–3.3)
LYMPHS PCT: 23 %
MCH: 26.4 pg (ref 25.1–34.0)
MCHC: 30.9 g/dL — ABNORMAL LOW (ref 31.5–36.0)
MCV: 85.6 fL (ref 79.5–101.0)
MONO ABS: 0.5 10*3/uL (ref 0.1–0.9)
Monocytes Relative: 9 %
NEUTROS ABS: 3.8 10*3/uL (ref 1.5–6.5)
Neutrophils Relative %: 66 %
PLATELETS: 274 10*3/uL (ref 145–400)
RBC: 4.58 MIL/uL (ref 3.70–5.45)
RDW: 15.6 % — AB (ref 11.2–14.5)
WBC Count: 5.8 10*3/uL (ref 3.9–10.3)

## 2017-12-07 LAB — RETICULOCYTES
RBC.: 4.58 MIL/uL (ref 3.70–5.45)
RETIC COUNT ABSOLUTE: 45.8 10*3/uL (ref 33.7–90.7)
Retic Ct Pct: 1 % (ref 0.7–2.1)

## 2017-12-07 NOTE — Telephone Encounter (Signed)
Gave pt avs and calendar with appts per 6/11 los.

## 2017-12-21 ENCOUNTER — Ambulatory Visit (INDEPENDENT_AMBULATORY_CARE_PROVIDER_SITE_OTHER): Payer: PPO | Admitting: Adult Health

## 2017-12-21 ENCOUNTER — Encounter: Payer: Self-pay | Admitting: Adult Health

## 2017-12-21 VITALS — BP 158/92 | HR 75 | Ht 61.0 in | Wt 108.8 lb

## 2017-12-21 DIAGNOSIS — R000 Tachycardia, unspecified: Secondary | ICD-10-CM

## 2017-12-21 DIAGNOSIS — Z1231 Encounter for screening mammogram for malignant neoplasm of breast: Secondary | ICD-10-CM | POA: Diagnosis not present

## 2017-12-21 DIAGNOSIS — Z0000 Encounter for general adult medical examination without abnormal findings: Secondary | ICD-10-CM | POA: Diagnosis not present

## 2017-12-21 DIAGNOSIS — E785 Hyperlipidemia, unspecified: Secondary | ICD-10-CM | POA: Diagnosis not present

## 2017-12-21 DIAGNOSIS — I159 Secondary hypertension, unspecified: Secondary | ICD-10-CM | POA: Diagnosis not present

## 2017-12-21 DIAGNOSIS — Z1239 Encounter for other screening for malignant neoplasm of breast: Secondary | ICD-10-CM

## 2017-12-21 HISTORY — DX: Encounter for general adult medical examination without abnormal findings: Z00.00

## 2017-12-21 HISTORY — DX: Hyperlipidemia, unspecified: E78.5

## 2017-12-21 MED ORDER — IPRATROPIUM-ALBUTEROL 0.5-2.5 (3) MG/3ML IN SOLN: 3.0000 mL | RESPIRATORY_TRACT | 6 refills | Status: DC | PRN

## 2017-12-21 NOTE — Assessment & Plan Note (Signed)
The 10-year ASCVD risk score Mikey Bussing DC Brooke Bonito., et al., 2013) is: 26.3%   Values used to calculate the score:     Age: 72 years     Sex: Female     Is Non-Hispanic African American: No     Diabetic: No     Tobacco smoker: Yes     Systolic Blood Pressure: 548 mmHg     Is BP treated: No     HDL Cholesterol: 62 mg/dL     Total Cholesterol: 241 mg/dL  LDL- 140 She was started on atorvastatin 20 mg in May 2019 Per pt- Oncology instructed her to hold statin until f/u in August Advised to follow heart healthy diet and continue regular walking

## 2017-12-21 NOTE — Patient Instructions (Addendum)
Preventive Care for Adults, Female  A healthy lifestyle and preventive care can promote health and wellness. Preventive health guidelines for women include the following key practices.   A routine yearly physical is a good way to check with your health care provider about your health and preventive screening. It is a chance to share any concerns and updates on your health and to receive a thorough exam.   Visit your dentist for a routine exam and preventive care every 6 months. Brush your teeth twice a day and floss once a day. Good oral hygiene prevents tooth decay and gum disease.   The frequency of eye exams is based on your age, health, family medical history, use of contact lenses, and other factors. Follow your health care provider's recommendations for frequency of eye exams.   Eat a healthy diet. Foods like vegetables, fruits, whole grains, low-fat dairy products, and lean protein foods contain the nutrients you need without too many calories. Decrease your intake of foods high in solid fats, added sugars, and salt. Eat the right amount of calories for you.Get information about a proper diet from your health care provider, if necessary.   Regular physical exercise is one of the most important things you can do for your health. Most adults should get at least 150 minutes of moderate-intensity exercise (any activity that increases your heart rate and causes you to sweat) each week. In addition, most adults need muscle-strengthening exercises on 2 or more days a week.   Maintain a healthy weight. The body mass index (BMI) is a screening tool to identify possible weight problems. It provides an estimate of body fat based on height and weight. Your health care provider can find your BMI, and can help you achieve or maintain a healthy weight.For adults 20 years and older:   - A BMI below 18.5 is considered underweight.   - A BMI of 18.5 to 24.9 is normal.   - A BMI of 25 to 29.9 is  considered overweight.   - A BMI of 30 and above is considered obese.   Maintain normal blood lipids and cholesterol levels by exercising and minimizing your intake of trans and saturated fats.  Eat a balanced diet with plenty of fruit and vegetables. Blood tests for lipids and cholesterol should begin at age 20 and be repeated every 5 years minimum.  If your lipid or cholesterol levels are high, you are over 40, or you are at high risk for heart disease, you may need your cholesterol levels checked more frequently.Ongoing high lipid and cholesterol levels should be treated with medicines if diet and exercise are not working.   If you smoke, find out from your health care provider how to quit. If you do not use tobacco, do not start.   Lung cancer screening is recommended for adults aged 55-80 years who are at high risk for developing lung cancer because of a history of smoking. A yearly low-dose CT scan of the lungs is recommended for people who have at least a 30-pack-year history of smoking and are a current smoker or have quit within the past 15 years. A pack year of smoking is smoking an average of 1 pack of cigarettes a day for 1 year (for example: 1 pack a day for 30 years or 2 packs a day for 15 years). Yearly screening should continue until the smoker has stopped smoking for at least 15 years. Yearly screening should be stopped for people who develop a   health problem that would prevent them from having lung cancer treatment.   If you are pregnant, do not drink alcohol. If you are breastfeeding, be very cautious about drinking alcohol. If you are not pregnant and choose to drink alcohol, do not have more than 1 drink per day. One drink is considered to be 12 ounces (355 mL) of beer, 5 ounces (148 mL) of wine, or 1.5 ounces (44 mL) of liquor.   Avoid use of street drugs. Do not share needles with anyone. Ask for help if you need support or instructions about stopping the use of  drugs.   High blood pressure causes heart disease and increases the risk of stroke. Your blood pressure should be checked at least yearly.  Ongoing high blood pressure should be treated with medicines if weight loss and exercise do not work.   If you are 69-55 years old, ask your health care provider if you should take aspirin to prevent strokes.   Diabetes screening involves taking a blood sample to check your fasting blood sugar level. This should be done once every 3 years, after age 38, if you are within normal weight and without risk factors for diabetes. Testing should be considered at a younger age or be carried out more frequently if you are overweight and have at least 1 risk factor for diabetes.   Breast cancer screening is essential preventive care for women. You should practice "breast self-awareness."  This means understanding the normal appearance and feel of your breasts and may include breast self-examination.  Any changes detected, no matter how small, should be reported to a health care provider.  Women in their 80s and 30s should have a clinical breast exam (CBE) by a health care provider as part of a regular health exam every 1 to 3 years.  After age 66, women should have a CBE every year.  Starting at age 1, women should consider having a mammogram (breast X-ray test) every year.  Women who have a family history of breast cancer should talk to their health care provider about genetic screening.  Women at a high risk of breast cancer should talk to their health care providers about having an MRI and a mammogram every year.   -Breast cancer gene (BRCA)-related cancer risk assessment is recommended for women who have family members with BRCA-related cancers. BRCA-related cancers include breast, ovarian, tubal, and peritoneal cancers. Having family members with these cancers may be associated with an increased risk for harmful changes (mutations) in the breast cancer genes BRCA1 and  BRCA2. Results of the assessment will determine the need for genetic counseling and BRCA1 and BRCA2 testing.   The Pap test is a screening test for cervical cancer. A Pap test can show cell changes on the cervix that might become cervical cancer if left untreated. A Pap test is a procedure in which cells are obtained and examined from the lower end of the uterus (cervix).   - Women should have a Pap test starting at age 57.   - Between ages 90 and 70, Pap tests should be repeated every 2 years.   - Beginning at age 63, you should have a Pap test every 3 years as long as the past 3 Pap tests have been normal.   - Some women have medical problems that increase the chance of getting cervical cancer. Talk to your health care provider about these problems. It is especially important to talk to your health care provider if a  new problem develops soon after your last Pap test. In these cases, your health care provider may recommend more frequent screening and Pap tests.   - The above recommendations are the same for women who have or have not gotten the vaccine for human papillomavirus (HPV).   - If you had a hysterectomy for a problem that was not cancer or a condition that could lead to cancer, then you no longer need Pap tests. Even if you no longer need a Pap test, a regular exam is a good idea to make sure no other problems are starting.   - If you are between ages 36 and 66 years, and you have had normal Pap tests going back 10 years, you no longer need Pap tests. Even if you no longer need a Pap test, a regular exam is a good idea to make sure no other problems are starting.   - If you have had past treatment for cervical cancer or a condition that could lead to cancer, you need Pap tests and screening for cancer for at least 20 years after your treatment.   - If Pap tests have been discontinued, risk factors (such as a new sexual partner) need to be reassessed to determine if screening should  be resumed.   - The HPV test is an additional test that may be used for cervical cancer screening. The HPV test looks for the virus that can cause the cell changes on the cervix. The cells collected during the Pap test can be tested for HPV. The HPV test could be used to screen women aged 70 years and older, and should be used in women of any age who have unclear Pap test results. After the age of 67, women should have HPV testing at the same frequency as a Pap test.   Colorectal cancer can be detected and often prevented. Most routine colorectal cancer screening begins at the age of 57 years and continues through age 26 years. However, your health care provider may recommend screening at an earlier age if you have risk factors for colon cancer. On a yearly basis, your health care provider may provide home test kits to check for hidden blood in the stool.  Use of a small camera at the end of a tube, to directly examine the colon (sigmoidoscopy or colonoscopy), can detect the earliest forms of colorectal cancer. Talk to your health care provider about this at age 23, when routine screening begins. Direct exam of the colon should be repeated every 5 -10 years through age 49 years, unless early forms of pre-cancerous polyps or small growths are found.   People who are at an increased risk for hepatitis B should be screened for this virus. You are considered at high risk for hepatitis B if:  -You were born in a country where hepatitis B occurs often. Talk with your health care provider about which countries are considered high risk.  - Your parents were born in a high-risk country and you have not received a shot to protect against hepatitis B (hepatitis B vaccine).  - You have HIV or AIDS.  - You use needles to inject street drugs.  - You live with, or have sex with, someone who has Hepatitis B.  - You get hemodialysis treatment.  - You take certain medicines for conditions like cancer, organ  transplantation, and autoimmune conditions.   Hepatitis C blood testing is recommended for all people born from [birth year] through 1965 and any individual  with known risks for hepatitis C.   Practice safe sex. Use condoms and avoid high-risk sexual practices to reduce the spread of sexually transmitted infections (STIs). STIs include gonorrhea, chlamydia, syphilis, trichomonas, herpes, HPV, and human immunodeficiency virus (HIV). Herpes, HIV, and HPV are viral illnesses that have no cure. They can result in disability, cancer, and death. Sexually active women aged 25 years and younger should be checked for chlamydia. Older women with new or multiple partners should also be tested for chlamydia. Testing for other STIs is recommended if you are sexually active and at increased risk.   Osteoporosis is a disease in which the bones lose minerals and strength with aging. This can result in serious bone fractures or breaks. The risk of osteoporosis can be identified using a bone density scan. Women ages 65 years and over and women at risk for fractures or osteoporosis should discuss screening with their health care providers. Ask your health care provider whether you should take a calcium supplement or vitamin D to There are also several preventive steps women can take to avoid osteoporosis and resulting fractures or to keep osteoporosis from worsening. -->Recommendations include:  Eat a balanced diet high in fruits, vegetables, calcium, and vitamins.  Get enough calcium. The recommended total intake of is 1,200 mg daily; for best absorption, if taking supplements, divide doses into 250-500 mg doses throughout the day. Of the two types of calcium, calcium carbonate is best absorbed when taken with food but calcium citrate can be taken on an empty stomach.  Get enough vitamin D. NAMS and the National Osteoporosis Foundation recommend at least 1,000 IU per day for women age 50 and over who are at risk of vitamin D  deficiency. Vitamin D deficiency can be caused by inadequate sun exposure (for example, those who live in northern latitudes).  Avoid alcohol and smoking. Heavy alcohol intake (more than 7 drinks per week) increases the risk of falls and hip fracture and women smokers tend to lose bone more rapidly and have lower bone mass than nonsmokers. Stopping smoking is one of the most important changes women can make to improve their health and decrease risk for disease.  Be physically active every day. Weight-bearing exercise (for example, fast walking, hiking, jogging, and weight training) may strengthen bones or slow the rate of bone loss that comes with aging. Balancing and muscle-strengthening exercises can reduce the risk of falling and fracture.  Consider therapeutic medications. Currently, several types of effective drugs are available. Healthcare providers can recommend the type most appropriate for each woman.  Eliminate environmental factors that may contribute to accidents. Falls cause nearly 90% of all osteoporotic fractures, so reducing this risk is an important bone-health strategy. Measures include ample lighting, removing obstructions to walking, using nonskid rugs on floors, and placing mats and/or grab bars in showers.  Be aware of medication side effects. Some common medicines make bones weaker. These include a type of steroid drug called glucocorticoids used for arthritis and asthma, some antiseizure drugs, certain sleeping pills, treatments for endometriosis, and some cancer drugs. An overactive thyroid gland or using too much thyroid hormone for an underactive thyroid can also be a problem. If you are taking these medicines, talk to your doctor about what you can do to help protect your bones.reduce the rate of osteoporosis.    Menopause can be associated with physical symptoms and risks. Hormone replacement therapy is available to decrease symptoms and risks. You should talk to your  health care provider   about whether hormone replacement therapy is right for you.   Use sunscreen. Apply sunscreen liberally and repeatedly throughout the day. You should seek shade when your shadow is shorter than you. Protect yourself by wearing long sleeves, pants, a wide-brimmed hat, and sunglasses year round, whenever you are outdoors.   Once a month, do a whole body skin exam, using a mirror to look at the skin on your back. Tell your health care provider of new moles, moles that have irregular borders, moles that are larger than a pencil eraser, or moles that have changed in shape or color.   -Stay current with required vaccines (immunizations).   Influenza vaccine. All adults should be immunized every year.  Tetanus, diphtheria, and acellular pertussis (Td, Tdap) vaccine. Pregnant women should receive 1 dose of Tdap vaccine during each pregnancy. The dose should be obtained regardless of the length of time since the last dose. Immunization is preferred during the 27th 36th week of gestation. An adult who has not previously received Tdap or who does not know her vaccine status should receive 1 dose of Tdap. This initial dose should be followed by tetanus and diphtheria toxoids (Td) booster doses every 10 years. Adults with an unknown or incomplete history of completing a 3-dose immunization series with Td-containing vaccines should begin or complete a primary immunization series including a Tdap dose. Adults should receive a Td booster every 10 years.  Varicella vaccine. An adult without evidence of immunity to varicella should receive 2 doses or a second dose if she has previously received 1 dose. Pregnant females who do not have evidence of immunity should receive the first dose after pregnancy. This first dose should be obtained before leaving the health care facility. The second dose should be obtained 4 8 weeks after the first dose.  Human papillomavirus (HPV) vaccine. Females aged 13 26  years who have not received the vaccine previously should obtain the 3-dose series. The vaccine is not recommended for use in pregnant females. However, pregnancy testing is not needed before receiving a dose. If a female is found to be pregnant after receiving a dose, no treatment is needed. In that case, the remaining doses should be delayed until after the pregnancy. Immunization is recommended for any person with an immunocompromised condition through the age of 26 years if she did not get any or all doses earlier. During the 3-dose series, the second dose should be obtained 4 8 weeks after the first dose. The third dose should be obtained 24 weeks after the first dose and 16 weeks after the second dose.  Zoster vaccine. One dose is recommended for adults aged 60 years or older unless certain conditions are present.  Measles, mumps, and rubella (MMR) vaccine. Adults born before 1957 generally are considered immune to measles and mumps. Adults born in 1957 or later should have 1 or more doses of MMR vaccine unless there is a contraindication to the vaccine or there is laboratory evidence of immunity to each of the three diseases. A routine second dose of MMR vaccine should be obtained at least 28 days after the first dose for students attending postsecondary schools, health care workers, or international travelers. People who received inactivated measles vaccine or an unknown type of measles vaccine during 1963 1967 should receive 2 doses of MMR vaccine. People who received inactivated mumps vaccine or an unknown type of mumps vaccine before 1979 and are at high risk for mumps infection should consider immunization with 2 doses of   MMR vaccine. For females of childbearing age, rubella immunity should be determined. If there is no evidence of immunity, females who are not pregnant should be vaccinated. If there is no evidence of immunity, females who are pregnant should delay immunization until after pregnancy.  Unvaccinated health care workers born before [birth year] who lack laboratory evidence of measles, mumps, or rubella immunity or laboratory confirmation of disease should consider measles and mumps immunization with 2 doses of MMR vaccine or rubella immunization with 1 dose of MMR vaccine.  Pneumococcal 13-valent conjugate (PCV13) vaccine. When indicated, a person who is uncertain of her immunization history and has no record of immunization should receive the PCV13 vaccine. An adult aged 54 years or older who has certain medical conditions and has not been previously immunized should receive 1 dose of PCV13 vaccine. This PCV13 should be followed with a dose of pneumococcal polysaccharide (PPSV23) vaccine. The PPSV23 vaccine dose should be obtained at least 8 weeks after the dose of PCV13 vaccine. An adult aged 58 years or older who has certain medical conditions and previously received 1 or more doses of PPSV23 vaccine should receive 1 dose of PCV13. The PCV13 vaccine dose should be obtained 1 or more years after the last PPSV23 vaccine dose.  Pneumococcal polysaccharide (PPSV23) vaccine. When PCV13 is also indicated, PCV13 should be obtained first. All adults aged 58 years and older should be immunized. An adult younger than age 65 years who has certain medical conditions should be immunized. Any person who resides in a nursing home or long-term care facility should be immunized. An adult smoker should be immunized. People with an immunocompromised condition and certain other conditions should receive both PCV13 and PPSV23 vaccines. People with human immunodeficiency virus (HIV) infection should be immunized as soon as possible after diagnosis. Immunization during chemotherapy or radiation therapy should be avoided. Routine use of PPSV23 vaccine is not recommended for American Indians, [place] Natives, or people younger than 65 years unless there are medical conditions that require PPSV23 vaccine. When indicated,  people who have unknown immunization and have no record of immunization should receive PPSV23 vaccine. One-time revaccination 5 years after the first dose of PPSV23 is recommended for people aged 70 64 years who have chronic kidney failure, nephrotic syndrome, asplenia, or immunocompromised conditions. People who received 1 2 doses of PPSV23 before age 32 years should receive another dose of PPSV23 vaccine at age 96 years or later if at least 5 years have passed since the previous dose. Doses of PPSV23 are not needed for people immunized with PPSV23 at or after age 55 years.  Meningococcal vaccine. Adults with asplenia or persistent complement component deficiencies should receive 2 doses of quadrivalent meningococcal conjugate (MenACWY-D) vaccine. The doses should be obtained at least 2 months apart. Microbiologists working with certain meningococcal bacteria, [employer] recruits, people at risk during an outbreak, and people who travel to or live in countries with a high rate of meningitis should be immunized. A first-year college student up through age 58 years who is living in a residence hall should receive a dose if she did not receive a dose on or after her 16th birthday. Adults who have certain high-risk conditions should receive one or more doses of vaccine.  Hepatitis A vaccine. Adults who wish to be protected from this disease, have certain high-risk conditions, work with hepatitis A-infected animals, work in hepatitis A research labs, or travel to or work in countries with a high rate of hepatitis A should be  immunized. Adults who were previously unvaccinated and who anticipate close contact with an international adoptee during the first 60 days after arrival in the [place] from a country with a high rate of hepatitis A should be immunized.  Hepatitis B vaccine.  Adults who wish to be protected from this disease, have certain high-risk conditions, may be exposed to blood or other infectious  body fluids, are household contacts or sex partners of hepatitis B positive people, are clients or workers in certain care facilities, or travel to or work in countries with a high rate of hepatitis B should be immunized.  Haemophilus influenzae type b (Hib) vaccine. A previously unvaccinated person with asplenia or sickle cell disease or having a scheduled splenectomy should receive 1 dose of Hib vaccine. Regardless of previous immunization, a recipient of a hematopoietic stem cell transplant should receive a 3-dose series 6 12 months after her successful transplant. Hib vaccine is not recommended for adults with HIV infection.  Preventive Services / Frequency Ages 6 to 39years  Blood pressure check.** / Every 1 to 2 years.  Lipid and cholesterol check.** / Every 5 years beginning at age 39.  Clinical breast exam.** / Every 3 years for women in their 61s and 62s.  BRCA-related cancer risk assessment.** / For women who have family members with a BRCA-related cancer (breast, ovarian, tubal, or peritoneal cancers).  Pap test.** / Every 2 years from ages 47 through 85. Every 3 years starting at age 34 through age 12 or 74 with a history of 3 consecutive normal Pap tests.  HPV screening.** / Every 3 years from ages 46 through ages 43 to 54 with a history of 3 consecutive normal Pap tests.  Hepatitis C blood test.** / For any individual with known risks for hepatitis C.  Skin self-exam. / Monthly.  Influenza vaccine. / Every year.  Tetanus, diphtheria, and acellular pertussis (Tdap, Td) vaccine.** / Consult your health care provider. Pregnant women should receive 1 dose of Tdap vaccine during each pregnancy. 1 dose of Td every 10 years.  Varicella vaccine.** / Consult your health care provider. Pregnant females who do not have evidence of immunity should receive the first dose after pregnancy.  HPV vaccine. / 3 doses over 6 months, if 64 and younger. The vaccine is not recommended for use in  pregnant females. However, pregnancy testing is not needed before receiving a dose.  Measles, mumps, rubella (MMR) vaccine.** / You need at least 1 dose of MMR if you were born in 1957 or later. You may also need a 2nd dose. For females of childbearing age, rubella immunity should be determined. If there is no evidence of immunity, females who are not pregnant should be vaccinated. If there is no evidence of immunity, females who are pregnant should delay immunization until after pregnancy.  Pneumococcal 13-valent conjugate (PCV13) vaccine.** / Consult your health care provider.  Pneumococcal polysaccharide (PPSV23) vaccine.** / 1 to 2 doses if you smoke cigarettes or if you have certain conditions.  Meningococcal vaccine.** / 1 dose if you are age 71 to 37 years and a Market researcher living in a residence hall, or have one of several medical conditions, you need to get vaccinated against meningococcal disease. You may also need additional booster doses.  Hepatitis A vaccine.** / Consult your health care provider.  Hepatitis B vaccine.** / Consult your health care provider.  Haemophilus influenzae type b (Hib) vaccine.** / Consult your health care provider.  Ages 55 to 64years  Blood pressure check.** / Every 1 to 2 years.  Lipid and cholesterol check.** / Every 5 years beginning at age 20 years.  Lung cancer screening. / Every year if you are aged 55 80 years and have a 30-pack-year history of smoking and currently smoke or have quit within the past 15 years. Yearly screening is stopped once you have quit smoking for at least 15 years or develop a health problem that would prevent you from having lung cancer treatment.  Clinical breast exam.** / Every year after age 40 years.  BRCA-related cancer risk assessment.** / For women who have family members with a BRCA-related cancer (breast, ovarian, tubal, or peritoneal cancers).  Mammogram.** / Every year beginning at age 40  years and continuing for as long as you are in good health. Consult with your health care provider.  Pap test.** / Every 3 years starting at age 30 years through age 65 or 70 years with a history of 3 consecutive normal Pap tests.  HPV screening.** / Every 3 years from ages 30 years through ages 65 to 70 years with a history of 3 consecutive normal Pap tests.  Fecal occult blood test (FOBT) of stool. / Every year beginning at age 50 years and continuing until age 75 years. You may not need to do this test if you get a colonoscopy every 10 years.  Flexible sigmoidoscopy or colonoscopy.** / Every 5 years for a flexible sigmoidoscopy or every 10 years for a colonoscopy beginning at age 50 years and continuing until age 75 years.  Hepatitis C blood test.** / For all people born from 1945 through 1965 and any individual with known risks for hepatitis C.  Skin self-exam. / Monthly.  Influenza vaccine. / Every year.  Tetanus, diphtheria, and acellular pertussis (Tdap/Td) vaccine.** / Consult your health care provider. Pregnant women should receive 1 dose of Tdap vaccine during each pregnancy. 1 dose of Td every 10 years.  Varicella vaccine.** / Consult your health care provider. Pregnant females who do not have evidence of immunity should receive the first dose after pregnancy.  Zoster vaccine.** / 1 dose for adults aged 60 years or older.  Measles, mumps, rubella (MMR) vaccine.** / You need at least 1 dose of MMR if you were born in 1957 or later. You may also need a 2nd dose. For females of childbearing age, rubella immunity should be determined. If there is no evidence of immunity, females who are not pregnant should be vaccinated. If there is no evidence of immunity, females who are pregnant should delay immunization until after pregnancy.  Pneumococcal 13-valent conjugate (PCV13) vaccine.** / Consult your health care provider.  Pneumococcal polysaccharide (PPSV23) vaccine.** / 1 to 2 doses if  you smoke cigarettes or if you have certain conditions.  Meningococcal vaccine.** / Consult your health care provider.  Hepatitis A vaccine.** / Consult your health care provider.  Hepatitis B vaccine.** / Consult your health care provider.  Haemophilus influenzae type b (Hib) vaccine.** / Consult your health care provider.  Ages 65 years and over  Blood pressure check.** / Every 1 to 2 years.  Lipid and cholesterol check.** / Every 5 years beginning at age 20 years.  Lung cancer screening. / Every year if you are aged 55 80 years and have a 30-pack-year history of smoking and currently smoke or have quit within the past 15 years. Yearly screening is stopped once you have quit smoking for at least 15 years or develop a health problem that   would prevent you from having lung cancer treatment.  Clinical breast exam.** / Every year after age 103 years.  BRCA-related cancer risk assessment.** / For women who have family members with a BRCA-related cancer (breast, ovarian, tubal, or peritoneal cancers).  Mammogram.** / Every year beginning at age 36 years and continuing for as long as you are in good health. Consult with your health care provider.  Pap test.** / Every 3 years starting at age 5 years through age 85 or 10 years with 3 consecutive normal Pap tests. Testing can be stopped between 65 and 70 years with 3 consecutive normal Pap tests and no abnormal Pap or HPV tests in the past 10 years.  HPV screening.** / Every 3 years from ages 93 years through ages 70 or 45 years with a history of 3 consecutive normal Pap tests. Testing can be stopped between 65 and 70 years with 3 consecutive normal Pap tests and no abnormal Pap or HPV tests in the past 10 years.  Fecal occult blood test (FOBT) of stool. / Every year beginning at age 8 years and continuing until age 45 years. You may not need to do this test if you get a colonoscopy every 10 years.  Flexible sigmoidoscopy or colonoscopy.** /  Every 5 years for a flexible sigmoidoscopy or every 10 years for a colonoscopy beginning at age 69 years and continuing until age 68 years.  Hepatitis C blood test.** / For all people born from [birth year] through 1965 and any individual with known risks for hepatitis C.  Osteoporosis screening.** / A one-time screening for women ages 7 years and over and women at risk for fractures or osteoporosis.  Skin self-exam. / Monthly.  Influenza vaccine. / Every year.  Tetanus, diphtheria, and acellular pertussis (Tdap/Td) vaccine.** / 1 dose of Td every 10 years.  Varicella vaccine.** / Consult your health care provider.  Zoster vaccine.** / 1 dose for adults aged 5 years or older.  Pneumococcal 13-valent conjugate (PCV13) vaccine.** / Consult your health care provider.  Pneumococcal polysaccharide (PPSV23) vaccine.** / 1 dose for all adults aged 74 years and older.  Meningococcal vaccine.** / Consult your health care provider.  Hepatitis A vaccine.** / Consult your health care provider.  Hepatitis B vaccine.** / Consult your health care provider.  Haemophilus influenzae type b (Hib) vaccine.** / Consult your health care provider. ** Family history and personal history of risk and conditions may change your health care provider's recommendations. Document Released: 08/11/2001 Document Revised: 04/05/2013  [facility] Patient Information 2014 [place], [place].   EXERCISE AND DIET:  We recommended that you start or continue a regular exercise program for good health. Regular exercise means any activity that makes your heart beat faster and makes you sweat.  We recommend exercising at least 30 minutes per day at least 3 days a week, preferably 5.  We also recommend a diet low in fat and sugar / carbohydrates.  Inactivity, poor dietary choices and obesity can cause diabetes, heart attack, stroke, and kidney damage, among others.     ALCOHOL AND SMOKING:  Women should limit their alcohol intake to no  more than 7 drinks/beers/glasses of wine (combined, not each!) per week. Moderation of alcohol intake to this level decreases your risk of breast cancer and liver damage.  ( And of course, no recreational drugs are part of a healthy lifestyle.)  Also, you should not be smoking at all or even being exposed to second hand smoke. Most people know smoking can  cause cancer, and various heart and lung diseases, but did you know it also contributes to weakening of your bones?  Aging of your skin?  Yellowing of your teeth and nails?   CALCIUM AND VITAMIN D:  Adequate intake of calcium and Vitamin D are recommended.  The recommendations for exact amounts of these supplements seem to change often, but generally speaking 600 mg of calcium (either carbonate or citrate) and 800 units of Vitamin D per day seems prudent. Certain women may benefit from higher intake of Vitamin D.  If you are among these women, your doctor will have told you during your visit.     PAP SMEARS:  Pap smears, to check for cervical cancer or precancers,  have traditionally been done yearly, although recent scientific advances have shown that most women can have pap smears less often.  However, every woman still should have a physical exam from her gynecologist or primary care physician every year. It will include a breast check, inspection of the vulva and vagina to check for abnormal growths or skin changes, a visual exam of the cervix, and then an exam to evaluate the size and shape of the uterus and ovaries.  And after 72 years of age, a rectal exam is indicated to check for rectal cancers. We will also provide age appropriate advice regarding health maintenance, like when you should have certain vaccines, screening for sexually transmitted diseases, bone density testing, colonoscopy, mammograms, etc.    MAMMOGRAMS:  All women over 53 years old should have a yearly mammogram. Many facilities now offer a "3D" mammogram, which may cost  around $50 extra out of pocket. If possible,  we recommend you accept the option to have the 3D mammogram performed.  It both reduces the number of women who will be called back for extra views which then turn out to be normal, and it is better than the routine mammogram at detecting truly abnormal areas.     COLONOSCOPY:  Colonoscopy to screen for colon cancer is recommended for all women at age 67.  We know, you hate the idea of the prep.  We agree, BUT, having colon cancer and not knowing it is worse!!  Colon cancer so often starts as a polyp that can be seen and removed at colonscopy, which can quite literally save your life!  And if your first colonoscopy is normal and you have no family history of colon cancer, most women don't have to have it again for 10 years.  Once every ten years, you can do something that may end up saving your life, right?  We will be happy to help you get it scheduled when you are ready.  Be sure to check your insurance coverage so you understand how much it will cost.  It may be covered as a preventative service at no cost, but you should check your particular policy.    Please continue all medications as directed, please remember to take Metoprolol '50mg'$  once daily/  Continue to drink plenty of water and follow heart healthy diet. Continue with Oncology as directed. YOU ARE DOING GREAT!!! Follow-up in 8 months, sooner if needed.

## 2017-12-21 NOTE — Assessment & Plan Note (Signed)
Colonoscopy-declined Mammogram- ordered  Immunizations-UTD Continue to drink plenty of water and follow heart healthy diet. Continue with Oncology as directed. Follow-up in 8 months, sooner if needed.

## 2017-12-21 NOTE — Progress Notes (Signed)
Subjective:    Patient ID: Chelsea Baker, female    DOB: March 17, 1946, 72 y.o.   MRN: [id number]  HPI: 08/25/17 OV:  Chelsea Baker presents to establish as a new pt.  She is a pleasant 72 year old female.  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 1  1/2 -2 PPD smoking for >50 years, and has not used tobacco >5 weeks. Jan 2019 Hospitalization- Presented to ED with 2 week hx of cough/wt loss/increasing dyspnea. CXR in ED revealed 3.4 cm RUL mass Subsequent CT showed 3.5 cm RUL mass concerning for primary bronchogenic carcinoma and 4 mm nodule in RLL- indeterminate. No evidence of metastasis. Bx + squamous cel carcinoma   She is followed by Dr. Romie Jumper- she will start radiation therapy next week.  She is followed by Dr. Melynda Ripple for systolic CHF  She has not had regular healthcare in years. 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 She denies ETOH use She reports drinking water all day and that her appetite is slowly increasing. She reports extreme fatigue and insomnia. Son at [facility] during [place]  12/21/17 OV: Chelsea Baker presents for CPE She stopped tobacco use 08/12/17, 75-100 pk yr hx She has completed radiatio therapy March 2019-  CT Chest 10/22/17 shows improvement in the right upper lobe mass after [facility] Repeat CT scheduled for August 2019 She has not needed supplemental O2 since March 2019 Per pt Oncology advised her to hold Atorvastatin until her f/u in fall 2019 She continues to walk 5-6 miles/day and reports "feeling just great". She lives with her daughter, son-in-law and two grandchildren She reports inconsistent compliance with Metoprolol 50mg  QD- she estimates to take 3-4 times/week We discussed the importance of taking BB daily  Healthcare Maintenance: PAP-N/A Mammogram-order  placed Colonoscopy-declined Immunizations- UTD LDCT- not indicated due recent CT/PET scanning for current Lung Ca treatment  Patient Care Team    Relationship Specialty Notifications Start End  Jamesina Gaugh, Berna Spare, NP PCP - General Family Medicine  08/25/17     Patient Active Problem List   Diagnosis Date Noted  . Healthcare maintenance 12/21/2017  . Hyperlipidemia 12/21/2017  . Hypertension 08/25/2017  . Chest discomfort 08/10/2017  . Abnormal echocardiogram 08/10/2017  . Hypoxia   . Dilated cardiomyopathy ([place]) 07/17/2017  . Shortness of breath   . Protein-calorie malnutrition ([place])   . Malignant neoplasm of right upper lobe of lung ([place]) 07/12/2017  . Tachycardia 07/12/2017  . Hyponatremia 07/12/2017  . Cavitating mass in right upper lung lobe 07/12/2017  . AAA (abdominal aortic aneurysm) without rupture ([place]) 04/10/2014  . Aftercare following surgery of the circulatory system 04/10/2014  . Aftercare following surgery of the circulatory system, [place] 11/15/2012     Past Medical History:  Diagnosis Date  . AAA (abdominal aortic aneurysm) ([place])   . Diverticulosis   . GERD (gastroesophageal reflux disease)   . Hiatal hernia   . Hypertension   . Hypokalemia   . Lumbar spondylolysis   . Lung cancer ([place])   . Lung tumor   . On home O2    2L N/C     Past Surgical History:  Procedure Laterality Date  . ABDOMINAL AORTIC ANEURYSM REPAIR  10/01/10   endostent repair  . CATARACT EXTRACTION, BILATERAL    . LUNG BIOPSY    . NM MYOVIEW LTD  2012   Normal  [place] with no ischemia or infarction.  Normal LV function.  EF 67%.  . TONSILLECTOMY    . TRANSTHORACIC ECHOCARDIOGRAM  07/17/2017   EF 35-40%.  Septal mid and basal inferior wall hypokinesis.  Mildly dilated LV.  Mildly reduced EF.  GR 1 DD.  Calcified mitral annulus.      Family History  Problem Relation Age of Onset  . Heart failure Mother   . Heart attack Mother 78       In 53s - 1st MI  . Coronary artery  disease Mother   . Coronary artery disease Father        Died at age 17  . Heart attack Father 5  . Heart disease Maternal Grandmother   . Heart disease Maternal Grandfather   . Cancer Neg Hx      Social History   Substance and Sexual Activity  Drug Use No     Social History   Substance and Sexual Activity  Alcohol Use Yes   Comment: rarely     Social History   Tobacco Use  Smoking Status Former Smoker  . Packs/day: 1.00  . Years: 50.00  . Pack years: 50.00  . Types: Cigarettes  . Last attempt to quit: 06/2017  . Years since quitting: 0.4  Smokeless Tobacco Never Used     Outpatient Encounter Medications as of 12/21/2017  Medication Sig  . acetaminophen (TYLENOL) 500 MG tablet Take 500-1,000 mg by mouth every 6 (six) hours as needed for mild pain, moderate pain or headache.  . albuterol (PROVENTIL HFA;VENTOLIN HFA) 108 (90 Base) MCG/ACT inhaler Inhale 2 puffs into the lungs every 4 (four) hours as needed for wheezing or shortness of breath.  Marland Kitchen ipratropium-albuterol (DUONEB) 0.5-2.5 (3) MG/3ML SOLN Take 3 mLs by nebulization every 4 (four) hours as needed (sob).  . [DISCONTINUED] ipratropium-albuterol (DUONEB) 0.5-2.5 (3) MG/3ML SOLN Take 3 mLs by nebulization every 4 (four) hours as needed. (Patient taking differently: Take 3 mLs by nebulization every 4 (four) hours as needed (sob). )  . metoprolol succinate (TOPROL-XL) 50 MG 24 hr tablet Take 1 tablet (50 mg total) by mouth daily. Take with or immediately following a meal.  . [DISCONTINUED] atorvastatin (LIPITOR) 20 MG tablet Take 1 tablet (20 mg total) by mouth daily.  . [DISCONTINUED] oseltamivir (TAMIFLU) 75 MG capsule Take 1 capsule (75 mg total) by mouth every 12 (twelve) hours. (Patient not taking: Reported on 10/11/2017)   No facility-administered encounter medications on file as of 12/21/2017.     Allergies: Patient has no known allergies.  Body mass index is 20.56 kg/m.  Blood pressure (!) 158/92,  pulse 75, height 5\' 1"  (1.549 m), weight 108 lb 12.8 oz (49.4 kg), SpO2 93 %.  Review of Systems  Constitutional: Negative for activity change, appetite change, chills, diaphoresis, fatigue, fever and unexpected weight change.  Eyes: Negative for visual disturbance.  Respiratory: Negative for cough, chest tightness, shortness of breath, wheezing and stridor.   Cardiovascular: Negative for chest pain, palpitations and leg swelling.  Gastrointestinal: Negative for abdominal distention, abdominal pain, blood in stool, constipation, diarrhea, nausea and vomiting.  Endocrine: Negative for cold intolerance, heat intolerance, polydipsia, polyphagia and polyuria.  Genitourinary: Negative for difficulty urinating, flank pain and hematuria.  Skin: Negative for color change, pallor, rash and wound.  Neurological: Negative for dizziness and headaches.  Hematological: Does not bruise/bleed easily.  Psychiatric/Behavioral: Negative for dysphoric mood, hallucinations, self-injury, sleep disturbance and suicidal ideas. The patient is not nervous/anxious and is  not hyperactive.        Objective:   Physical Exam  Constitutional: She is oriented to person, place, and time. She appears well-developed and well-nourished. No distress.  HENT:  Head: Normocephalic and atraumatic.  Right Ear: External ear normal.  Left Ear: External ear normal.  Cardiovascular: Regular rhythm, normal heart sounds and intact distal pulses.  No murmur heard. Pulmonary/Chest: No respiratory distress. She has decreased breath sounds in the right middle field. She has no wheezes. She has no rhonchi. She has no rales. She exhibits no tenderness.  Neurological: She is alert and oriented to person, place, and time.  Skin: Skin is warm and dry. She is not diaphoretic.  Psychiatric: She has a normal mood and affect. Her behavior is normal. Judgment and thought content normal.  Nursing note and vitals reviewed.     Assessment & Plan:     1. Screening for breast cancer   2. Healthcare maintenance   3. Secondary hypertension   4. Tachycardia   5. Hyperlipidemia, unspecified hyperlipidemia type     Hypertension Both BP checks above goal She has only been taking Toprol XL 50mg  QD, 3-4 times week Discussed importance BB and taking rx at consistent time each day to improve compliance She denies acute cardiac sx's   Tachycardia BP 158/92 HR 75 Inconsistent use of Metoprolol 50mg  QD Encouraged to take at same everyday to improve med compliance  Healthcare maintenance Colonoscopy-declined Mammogram- ordered  Immunizations-UTD Continue to drink plenty of water and follow heart healthy diet. Continue with Oncology as directed. Follow-up in 8 months, sooner if needed.  Hyperlipidemia The 10-year ASCVD risk score Mikey Bussing DC Jr., et al., 2013) is: 26.3%   Values used to calculate the score:     Age: 39 years     Sex: Female     Is Non-Hispanic African American: No     Diabetic: No     Tobacco smoker: Yes     Systolic Blood Pressure: 101 mmHg     Is BP treated: No     HDL Cholesterol: 62 mg/dL     Total Cholesterol: 241 mg/dL  LDL- 140 She was started on atorvastatin 20 mg in May 2019 Per pt- Oncology instructed her to hold statin until f/u in August Advised to follow heart healthy diet and continue regular walking   FOLLOW-UP:  Return in about 8 months (around 08/23/2018) for Regular Follow Up.

## 2017-12-21 NOTE — Assessment & Plan Note (Signed)
BP 158/92 HR 75 Inconsistent use of Metoprolol 50mg  QD Encouraged to take at same everyday to improve med compliance

## 2017-12-21 NOTE — Assessment & Plan Note (Signed)
Both BP checks above goal She has only been taking Toprol XL 50mg  QD, 3-4 times week Discussed importance BB and taking rx at consistent time each day to improve compliance She denies acute cardiac sx's

## 2018-03-01 ENCOUNTER — Ambulatory Visit (HOSPITAL_COMMUNITY)
Admission: RE | Admit: 2018-03-01 | Discharge: 2018-03-01 | Disposition: A | Discharge status: Home / Self Care | Payer: PPO | Source: Ambulatory Visit | Attending: Hematology | Admitting: Hematology

## 2018-03-01 DIAGNOSIS — I428 Other cardiomyopathies: Secondary | ICD-10-CM | POA: Insufficient documentation

## 2018-03-01 DIAGNOSIS — R911 Solitary pulmonary nodule: Secondary | ICD-10-CM | POA: Diagnosis not present

## 2018-03-01 DIAGNOSIS — C3411 Malignant neoplasm of upper lobe, right bronchus or lung: Secondary | ICD-10-CM | POA: Diagnosis not present

## 2018-03-01 DIAGNOSIS — I700 Atherosclerosis of aorta: Secondary | ICD-10-CM | POA: Diagnosis not present

## 2018-03-08 ENCOUNTER — Inpatient Hospital Stay: Payer: PPO | Attending: Hematology | Admitting: Hematology

## 2018-03-08 ENCOUNTER — Inpatient Hospital Stay: Payer: PPO

## 2018-03-08 ENCOUNTER — Encounter: Payer: Self-pay | Admitting: Hematology

## 2018-03-08 ENCOUNTER — Telehealth: Payer: Self-pay | Admitting: Hematology

## 2018-03-08 VITALS — BP 146/83 | HR 83 | Temp 97.5°F | Resp 18 | Ht 61.0 in | Wt 116.5 lb

## 2018-03-08 DIAGNOSIS — R911 Solitary pulmonary nodule: Secondary | ICD-10-CM

## 2018-03-08 DIAGNOSIS — C3411 Malignant neoplasm of upper lobe, right bronchus or lung: Secondary | ICD-10-CM

## 2018-03-08 DIAGNOSIS — C3490 Malignant neoplasm of unspecified part of unspecified bronchus or lung: Secondary | ICD-10-CM

## 2018-03-08 LAB — CBC WITH DIFFERENTIAL/PLATELET
BASOS ABS: 0 10*3/uL (ref 0.0–0.1)
BASOS PCT: 1 %
EOS PCT: 2 %
Eosinophils Absolute: 0.1 10*3/uL (ref 0.0–0.5)
HEMATOCRIT: 39.8 % (ref 34.8–46.6)
Hemoglobin: 12.8 g/dL (ref 11.6–15.9)
Lymphocytes Relative: 24 %
Lymphs Abs: 1.2 10*3/uL (ref 0.9–3.3)
MCH: 27.9 pg (ref 25.1–34.0)
MCHC: 32.3 g/dL (ref 31.5–36.0)
MCV: 86.2 fL (ref 79.5–101.0)
MONO ABS: 0.4 10*3/uL (ref 0.1–0.9)
MONOS PCT: 8 %
Neutro Abs: 3.2 10*3/uL (ref 1.5–6.5)
Neutrophils Relative %: 65 %
PLATELETS: 298 10*3/uL (ref 145–400)
RBC: 4.61 MIL/uL (ref 3.70–5.45)
RDW: 14.8 % — AB (ref 11.2–14.5)
WBC: 4.9 10*3/uL (ref 3.9–10.3)

## 2018-03-08 LAB — CMP (CANCER CENTER ONLY)
ALBUMIN: 3.7 g/dL (ref 3.5–5.0)
ALT: 16 U/L (ref 0–44)
ANION GAP: 9 (ref 5–15)
AST: 28 U/L (ref 15–41)
Alkaline Phosphatase: 109 U/L (ref 38–126)
BILIRUBIN TOTAL: 0.2 mg/dL — AB (ref 0.3–1.2)
BUN: 13 mg/dL (ref 8–23)
CHLORIDE: 103 mmol/L (ref 98–111)
CO2: 25 mmol/L (ref 22–32)
Calcium: 9.4 mg/dL (ref 8.9–10.3)
Creatinine: 1.04 mg/dL — ABNORMAL HIGH (ref 0.44–1.00)
GFR, Est AFR Am: >60 mL/min (ref >60)
GFR, Estimated: 52 mL/min — ABNORMAL LOW (ref >60)
GLUCOSE: 134 mg/dL — AB (ref 70–99)
POTASSIUM: 4.3 mmol/L (ref 3.5–5.1)
Sodium: 137 mmol/L (ref 135–145)
TOTAL PROTEIN: 7.4 g/dL (ref 6.5–8.1)

## 2018-03-08 NOTE — Progress Notes (Signed)
HEMATOLOGY/ONCOLOGY CONSULTATION NOTE  Date of Service: 03/08/2018  Patient Care Team: Esaw Grandchild, NP as PCP - General (Family Medicine)  CHIEF COMPLAINTS/PURPOSE OF CONSULTATION:   F/u for recently diagnosed squamous cell lung cancer   HISTORY OF PRESENTING ILLNESS:    Chelsea Baker is a wonderful 72 y.o. female who has been referred to [place] by Dr .Sloan Leiter, Henreitta Leber, MD  for evaluation and management of newly diagnosed Squmous cell carcinoma of the RUL.  Patient has a history of hypertension, nicotine dependence, GERD, hiatal hernia, AAA and presented with a 2-week history of cough, weight loss about 20 pounds in about 6 months and increasing shortness of breath over the last several days prior to admission.  In the ED the patient had a chest x-ray which showed a 3.4 cm Which showed a 3.4 cm right upper lobe mass which appeared cavitary.  Subsequent CT of the chest showed a 3.5 cm cavitary mass involving the right upper lobe concerning for a primary bronchogenic carcinoma.  She was also noted to have a 4 mm nodule which is indeterminate in the right lower lobe.  No evidence of metastatic disease noted in the chest or upper abdomen. No evidence of pulmonary embolism.  Patient subsequently had a CT-guided biopsy of the right upper lobe lung lesion on 07/15/2017.  Pathology revealed squamous cell carcinoma of the lung. She had other workup including an AFB smear which was negative.  Patient notes that she does not really have a primary care physician and has not had good medical follow-up and does not really see a doctor regularly. She has had a history of smoking 1/2-2 packs/day for 50 years.  No diagnosed COPD though likely has significant decline in lung function at baseline. Currently was short of breath despite lung cancer not involving a large part of the lung at this time and no evidence of PE.  Still needing 2-3 L of oxygen by nasal cannula currently and endorses  shortness of breath with minimal walking.  She notes that she lives with her son.   INTERVAL HISTORY   Chelsea Baker is here regarding her lung cancer. The patient's last visit with [place] was on 12/07/17. The pt reports that she is doing well overall.   The pt reports that she has had good energy levels. She notes that she is a little SOB when walking around outside and continues to use her nebulizer about once a day and doesn't require using her inhaler. She notes that she has a mild cough every now and then, and denies any blood in phlegm. She works at [employer] and notes that the dust sometimes makes her cough.    She notes that she continues to eat very well and has gained 8 pounds in the interim, now to 116 pounds. She also notes that she has been walking about 6 miles a day at work.   Of note since the patient's last visit, pt has had CT Chest completed on 03/01/18 with results revealing Continued reduction in spiculated posterior right upper lobe pulmonary nodule. 2. Left upper lobe 1.6 cm spiculated pulmonary nodule is increased in size and density, compatible with malignancy, either a second primary bronchogenic carcinoma or contralateral metastasis. 3. No thoracic adenopathy.  Lab results today (03/08/18) of CBC w/diff, CMP is as follows: all values are WNL except for RDW at 14.8, Glucose at 134, Creatinine at 1.04, Total Bilirubin at 0.2, GFR at 52.  On review of systems,  pt reports occasional SOB, good energy levels, occasional cough, eating well, desired weight gain, staying active, moving her bowels well, and denies CP, blood in phlegm, new bone pains, abdominal pains, and any other symptoms.   MEDICAL HISTORY:  Past Medical History:  Diagnosis Date  . AAA (abdominal aortic aneurysm) ([place])   . Diverticulosis   . GERD (gastroesophageal reflux disease)   . Hiatal hernia   . Hypertension   . Hypokalemia   . Lumbar spondylolysis   . Lung cancer ([place])   . Lung tumor   . On home  O2    2L N/C    SURGICAL HISTORY: Past Surgical History:  Procedure Laterality Date  . ABDOMINAL AORTIC ANEURYSM REPAIR  10/01/10   endostent repair  . CATARACT EXTRACTION, BILATERAL    . LUNG BIOPSY    . NM MYOVIEW LTD  2012   Normal Lexiscan Myoview with no ischemia or infarction.  Normal LV function.  EF 67%.  . TONSILLECTOMY    . TRANSTHORACIC ECHOCARDIOGRAM  07/17/2017   EF 35-40%.  Septal mid and basal inferior wall hypokinesis.  Mildly dilated LV.  Mildly reduced EF.  GR 1 DD.  Calcified mitral annulus.     SOCIAL HISTORY: Social History   Socioeconomic History  . Marital status: Single    Spouse name: Not on file  . Number of children: Not on file  . Years of education: Not on file  . Highest education level: Not on file  Occupational History  . Not on file  Social Needs  . Financial resource strain: Not on file  . Food insecurity:    Worry: Not on file    Inability: Not on file  . Transportation needs:    Medical: Not on file    Non-medical: Not on file  Tobacco Use  . Smoking status: Former Smoker    Packs/day: 1.00    Years: 50.00    Pack years: 50.00    Types: Cigarettes    Last attempt to quit: 06/2017    Years since quitting: 0.6  . Smokeless tobacco: Never Used  Substance and Sexual Activity  . Alcohol use: Yes    Comment: rarely  . Drug use: No  . Sexual activity: Not Currently  Lifestyle  . Physical activity:    Days per week: Not on file    Minutes per session: Not on file  . Stress: Not on file  Relationships  . Social connections:    Talks on phone: Not on file    Gets together: Not on file    Attends religious service: Not on file    Active member of club or organization: Not on file    Attends meetings of clubs or organizations: Not on file    Relationship status: Not on file  . Intimate partner violence:    Fear of current or ex partner: Not on file    Emotionally abused: Not on file    Physically abused: Not on file    Forced  sexual activity: Not on file  Other Topics Concern  . Not on file  Social History Narrative  . Not on file    FAMILY HISTORY: Family History  Problem Relation Age of Onset  . Heart failure Mother   . Heart attack Mother 24       In 5s - 1st MI  . Coronary artery disease Mother   . Coronary artery disease Father        Died at age  12  . Heart attack Father 49  . Heart disease Maternal Grandmother   . Heart disease Maternal Grandfather   . Cancer Neg Hx     ALLERGIES:  has No Known Allergies.  MEDICATIONS:  Current Outpatient Medications  Medication Sig Dispense Refill  . acetaminophen (TYLENOL) 500 MG tablet Take 500-1,000 mg by mouth every 6 (six) hours as needed for mild pain, moderate pain or headache.    . albuterol (PROVENTIL HFA;VENTOLIN HFA) 108 (90 Base) MCG/ACT inhaler Inhale 2 puffs into the lungs every 4 (four) hours as needed for wheezing or shortness of breath. 1 Inhaler 0  . ipratropium-albuterol (DUONEB) 0.5-2.5 (3) MG/3ML SOLN Take 3 mLs by nebulization every 4 (four) hours as needed (sob). 3 mL 6  . metoprolol succinate (TOPROL-XL) 50 MG 24 hr tablet Take 1 tablet (50 mg total) by mouth daily. Take with or immediately following a meal. 90 tablet 3   No current facility-administered medications for this visit.     REVIEW OF SYSTEMS:    A 10+ POINT REVIEW OF SYSTEMS WAS OBTAINED including neurology, dermatology, psychiatry, cardiac, respiratory, lymph, extremities, GI, GU, Musculoskeletal, constitutional, breasts, reproductive, HEENT.  All pertinent positives are noted in the HPI.  All others are negative.   PHYSICAL EXAMINATION: ECOG PERFORMANCE STATUS: 3 - Symptomatic, >50% confined to bed  . Vitals:   03/08/18 1314  BP: (!) 146/83  Pulse: 83  Resp: 18  Temp: (!) 97.5 F (36.4 C)  SpO2: 97%   Filed Weights   03/08/18 1314  Weight: 116 lb 8 oz (52.8 kg)   .Body mass index is 22.01 kg/m. NAD On [place] 2L/min O2  GENERAL:alert, in no acute  distress and comfortable SKIN: no acute rashes, no significant lesions EYES: conjunctiva are pink and non-injected, sclera anicteric OROPHARYNX: MMM, no exudates, no oropharyngeal erythema or ulceration NECK: supple, no JVD LYMPH:  no palpable lymphadenopathy in the cervical, axillary or inguinal regions LUNGS: b/l decreased air entry and scattered rhonchi HEART: regular rate & rhythm ABDOMEN:  normoactive bowel sounds , non tender, not distended. No palpable hepatosplenomegaly.  Extremity: no pedal edema PSYCH: alert & oriented x 3 with fluent speech NEURO: no focal motor/sensory deficits   LABORATORY DATA:  I have reviewed the data as listed  . CBC Latest Ref Rng & Units 03/08/2018 12/07/2017 11/15/2017  WBC 3.9 - 10.3 K/uL 4.9 5.8 3.9  Hemoglobin 11.6 - 15.9 g/dL 12.8 12.1 11.6  Hematocrit 34.8 - 46.6 % 39.8 39.2 37.2  Platelets 145 - 400 K/uL 298 274 336    . CMP Latest Ref Rng & Units 03/08/2018 12/07/2017 11/15/2017  Glucose 70 - 99 mg/dL 134(H) 113 85  BUN 8 - 23 mg/dL 13 16 13   Creatinine 0.44 - 1.00 mg/dL 1.04(H) 0.87 0.79  Sodium 135 - 145 mmol/L 137 137 136  Potassium 3.5 - 5.1 mmol/L 4.3 3.9 4.9  Chloride 98 - 111 mmol/L 103 104 98  CO2 22 - 32 mmol/L 25 26 22   Calcium 8.9 - 10.3 mg/dL 9.4 9.7 9.6  Total Protein 6.5 - 8.1 g/dL 7.4 6.9 6.5  Total Bilirubin 0.3 - 1.2 mg/dL 0.2(L) <0.2(L) 0.2  Alkaline Phos 38 - 126 U/L 109 104 93  AST 15 - 41 U/L 28 25 24   ALT 0 - 44 U/L 16 23 15     PATHOLOGY       RADIOGRAPHIC STUDIES: I have personally reviewed the radiological images as listed and agreed with the findings in the report. Ct  Chest Wo Contrast  Result Date: 03/01/2018 CLINICAL DATA:  Right upper lobe stage I non-small cell lung cancer completed radiation therapy 09/10/2017. Restaging. EXAM: CT CHEST WITHOUT CONTRAST TECHNIQUE: Multidetector CT imaging of the chest was performed following the standard protocol without IV contrast. COMPARISON:  10/21/2017 chest  CT. FINDINGS: Cardiovascular: Normal heart size. No significant pericardial effusion/thickening. Left main and 3 vessel coronary atherosclerosis. Atherosclerotic nonaneurysmal thoracic aorta. Normal caliber pulmonary arteries. Mediastinum/Nodes: No discrete thyroid nodules. Unremarkable esophagus. No pathologically enlarged axillary, mediastinal or hilar lymph nodes, noting limited sensitivity for the detection of hilar adenopathy on this noncontrast study. Lungs/Pleura: No pneumothorax. No pleural effusion. Mild centrilobular emphysema with mild diffuse bronchial wall thickening. Spiculated posterior right upper lobe 2.3 x 1.5 cm pulmonary nodule (series 5/image 38), previously 2.5 x 1.9 cm, mildly decreased. Superior segment right lower lobe 0.4 cm solid pulmonary nodule (series 5/image 55), stable. Left upper lobe spiculated 1.6 cm solid pulmonary nodule (series 5/image 74), increased in size from 1.0 cm and significantly increased in density. No acute consolidative airspace disease or new significant pulmonary nodules. Upper abdomen: Partially visualized stent graft in the abdominal aorta. Musculoskeletal: No aggressive appearing focal osseous lesions. Marked thoracic spondylosis. IMPRESSION: 1. Continued reduction in spiculated posterior right upper lobe pulmonary nodule. 2. Left upper lobe 1.6 cm spiculated pulmonary nodule is increased in size and density, compatible with malignancy, either a second primary bronchogenic carcinoma or contralateral metastasis. 3. No thoracic adenopathy. Aortic Atherosclerosis (ICD10-I70.0) and Emphysema (ICD10-J43.9). Electronically Signed   By: Ilona Sorrel M.D.   On: 03/01/2018 17:20    ASSESSMENT & PLAN:  Chelsea Baker is a 72 y.o. female with    1) h/o right upper lobe squamous cell carcinoma of the lung. Stage I Presenting with a cavitary RUL mass.  Biopsy proven to be squamous cell carcinoma. No overt mediastinal or hilar lymphadenopathy noted. PET/CT  showed no overt metastatic disease CT head neg for mets. Patient declined MRI CT Chest 10/22/17 shows improvement in the right upper lobe mass after [facility]  Patient was treatment with SRS and lesions improved/stabilized  2) Now with Left upper lobe 1.6 cm spiculated pulmonary nodule- increasing in size.  PLAN: -Discussed pt labwork today, 03/08/18; blood counts and chemistries are stable  -Discussed the 03/01/18 CT Chest which revealed Continued reduction in spiculated posterior right upper lobe pulmonary nodule. 2. Left upper lobe 1.6 cm spiculated pulmonary nodule is increased in size and density, compatible with malignancy, either a second primary bronchogenic carcinoma or contralateral metastasis. 3. No thoracic adenopathy.  -Discussed that new nodule mostly concerning for a new primary neoplasm, as no lymph nodes were obviously involved in chest -Would like to repeat PET/CT to rule out any mets  -Advised that the pt return to care with Dr. Lisbeth Renshaw in [place] and will place a referral as well -Recommended that the pt receive a flu vaccine this year -Will see the pt back in 3 weeks    2) Dyspnea and rest and on minimal exertion. - much improved CT chest with no PE  significant COPD at baseline -  Needing 2-3 L of oxygen by nasal cannula in hospital. and is currently using 2L/min [place] at home ECHO ef 35-40% -Pulmonary function testing -severe obstruction - optimize rx -noted to have systolic cardiomyopathy - mx Dr Ellyn Hack. -optimize mx of COPD with PCP/pulmonologist.  3) Poor medical f/u. Past Medical History:  Diagnosis Date  . AAA (abdominal aortic aneurysm) ([place])   . Diverticulosis   .  GERD (gastroesophageal reflux disease)   . Hiatal hernia   . Hypertension   . Hypokalemia   . Lumbar spondylolysis   . Lung cancer ([place])   . Lung tumor   . On home O2    2L N/C   4) Protein calorie malnutrition -nutritional consultation  5) Heavy smoker 75-100 pk-yrs -Pt reported on 08/12/17  that she has completely quit smoking and plans to never smoke again.  -Pt has continued smoking cessation as of 03/08/18 and was encouraged to keep up her good work.    PET/CT in 1 weeks Refer to radiation oncology Dr Lisbeth Renshaw for new LUL lung nodule concerning for lung cancer for consideration of SRS.  RTC with Dr [name] in 3 weeks    All of the patients questions were answered with apparent satisfaction. The patient knows to call the clinic with any problems, questions or concerns.  The total time spent in the appt was 25 minutes and more than 50% was on counseling and direct patient cares.     Sullivan Lone MD MS AAHIVMS [facility] Hematology/Oncology Physician [facility]  (Office):       [phone] (Work cell):  [phone] (Fax):           [phone]  I, Baldwin [place], am acting as a scribe for Dr. [name]  .I have reviewed the above documentation for accuracy and completeness, and I agree with the above. [name] MD

## 2018-03-08 NOTE — Telephone Encounter (Signed)
Gave pt avs and calendar  °

## 2018-03-09 ENCOUNTER — Encounter: Payer: Self-pay | Admitting: Radiation Oncology

## 2018-03-17 NOTE — Progress Notes (Signed)
Thoracic Location of Tumor / Histology: Squamous cell carcinoma of the RUL  Patient presented with s 2-week history of cough, weight loss of about 20 pounds in 6 monthsand increasing shortness of breath. Patient presented to a walk in clinic thinking she had the flu. She was evaluated and instructed to go to the ER. Patient did not go to ER as order. Patient presented to ER several days later after she didn't get to feeling any better.   Biopsies revealed:   07/15/17 Diagnosis Lung, needle/core biopsy(ies), RUL - SQUAMOUS CELL CARCINOMA.  Tobacco/Marijuana/Snuff/ETOH use: smoked 1-2 ppd for 50 years, quit 06/2017.  Rare ETOH use.  Past/Anticipated interventions by cardiothoracic surgery, if any: Patient is a poor surgical candidate due to oxygen needs and smoking history.  Past/Anticipated interventions by medical oncology, if any:Dr. [name]  IMPRESSION:03-18-18  1. Intense metabolic activity associated with enlarging LEFT upper lobe pulmonary nodule is most suggestive primary bronchogenic carcinoma. 2. Mild to moderate activity associated treated pulmonary nodule in the RIGHT upper lobe with associated moderate metabolic activity in adjacent chest wall favored post radiation change / inflammation. 3. Stable additional RIGHT lower lobe pulmonary nodule. 4. No new pulmonary nodules evident. 5. No evidence of metastatic mediastinal adenopathy or distant disease.   Electronically Signed   By: Suzy Bouchard M.D.   On: 03/18/2018 17:12   03-01-18 CT Chest WO Contrast  IMPRESSION: 1. Continued reduction in spiculated posterior right upper lobe pulmonary nodule. 2. Left upper lobe 1.6 cm spiculated pulmonary nodule is increased in size and density, compatible with malignancy, either a second primary bronchogenic carcinoma or contralateral metastasis. 3. No thoracic adenopathy.  Aortic Atherosclerosis (ICD10-I70.0) and Emphysema (ICD10-J43.9).   Electronically  Signed By: Ilona Sorrel M.D. On: 03/01/2018 17:20    Signs/Symptoms  Weight changes, if any: Has gained 9 lbs. Since June 2019  Respiratory complaints, if any: SOB when walking around outside, mild cough  at night,continues to use her nebulizer about once a day and doesn't require using her inhaler  Hemoptysis, if any:No  Pain issues, if DJM:EQAS hip 4/10 taking Tylenol    SAFETY ISSUES:  Prior radiation? 09-01-17-09-10-17 RUL  Pacemaker/ICD?No   Possible current pregnancy?No  Is the patient on methotrexate? No  Current Complaints / other details:  72 year old female. Single. Works full time at [employer].          Wt Readings from Last 3 Encounters:  03/22/18 117 lb 9.6 oz (53.3 kg)  03/08/18 116 lb 8 oz (52.8 kg)  12/21/17 108 lb 12.8 oz (49.4 kg)  BP (!) 162/90 (BP Location: Right Arm, Patient Position: Sitting) Comment (BP Location): Has not been taking her BP med every day  Pulse 78   Temp 97.6 F (36.4 C) (Oral)   Ht 5\' 1"  (1.549 m)   Wt 117 lb 9.6 oz (53.3 kg)   SpO2 94%   BMI 22.22 kg/m

## 2018-03-18 ENCOUNTER — Ambulatory Visit (HOSPITAL_COMMUNITY)
Admission: RE | Admit: 2018-03-18 | Discharge: 2018-03-18 | Disposition: A | Discharge status: Home / Self Care | Payer: PPO | Source: Ambulatory Visit | Attending: Hematology | Admitting: Hematology

## 2018-03-18 DIAGNOSIS — C3490 Malignant neoplasm of unspecified part of unspecified bronchus or lung: Secondary | ICD-10-CM | POA: Insufficient documentation

## 2018-03-18 DIAGNOSIS — R911 Solitary pulmonary nodule: Secondary | ICD-10-CM | POA: Diagnosis not present

## 2018-03-18 LAB — GLUCOSE, CAPILLARY: Glucose-Capillary: 89 mg/dL (ref 70–99)

## 2018-03-18 MED ORDER — FLUDEOXYGLUCOSE F - 18 (FDG) INJECTION
6.3000 | Freq: Once | INTRAVENOUS | Status: AC | PRN
  Administered 2018-03-18: 6.3 via INTRAVENOUS

## 2018-03-22 ENCOUNTER — Ambulatory Visit
Admission: RE | Admit: 2018-03-22 | Discharge: 2018-03-22 | Disposition: A | Discharge status: Home / Self Care | Payer: PPO | Source: Ambulatory Visit | Attending: Radiation Oncology | Admitting: Radiation Oncology

## 2018-03-22 ENCOUNTER — Encounter: Payer: Self-pay | Admitting: Radiation Oncology

## 2018-03-22 ENCOUNTER — Other Ambulatory Visit: Payer: Self-pay

## 2018-03-22 VITALS — BP 162/90 | HR 78 | Temp 97.6°F | Ht 61.0 in | Wt 117.6 lb

## 2018-03-22 DIAGNOSIS — R918 Other nonspecific abnormal finding of lung field: Secondary | ICD-10-CM | POA: Diagnosis not present

## 2018-03-22 DIAGNOSIS — M25552 Pain in left hip: Secondary | ICD-10-CM | POA: Diagnosis not present

## 2018-03-22 DIAGNOSIS — Z87891 Personal history of nicotine dependence: Secondary | ICD-10-CM | POA: Insufficient documentation

## 2018-03-22 DIAGNOSIS — Z923 Personal history of irradiation: Secondary | ICD-10-CM | POA: Insufficient documentation

## 2018-03-22 DIAGNOSIS — Z79899 Other long term (current) drug therapy: Secondary | ICD-10-CM | POA: Insufficient documentation

## 2018-03-22 DIAGNOSIS — Z9981 Dependence on supplemental oxygen: Secondary | ICD-10-CM | POA: Diagnosis not present

## 2018-03-22 DIAGNOSIS — C3412 Malignant neoplasm of upper lobe, left bronchus or lung: Secondary | ICD-10-CM | POA: Insufficient documentation

## 2018-03-22 DIAGNOSIS — C3411 Malignant neoplasm of upper lobe, right bronchus or lung: Secondary | ICD-10-CM

## 2018-03-22 DIAGNOSIS — Z85118 Personal history of other malignant neoplasm of bronchus and lung: Secondary | ICD-10-CM | POA: Diagnosis not present

## 2018-03-22 DIAGNOSIS — I10 Essential (primary) hypertension: Secondary | ICD-10-CM | POA: Diagnosis not present

## 2018-03-22 HISTORY — DX: Malignant neoplasm of upper lobe, left bronchus or lung: C34.12

## 2018-03-22 NOTE — Addendum Note (Signed)
Encounter addended by: Hayden Pedro, PA-C on: 03/22/2018 1:33 PM  Actions taken: Sign clinical note

## 2018-03-22 NOTE — Progress Notes (Addendum)
Radiation Oncology         [phone]) [phone] ________________________________  Name: [place] MRN: [id number]  Date of Service: 03/22/2018 DOB: [birth year]  Reconsult Note  CC: [place] Grandchild, NP  Chelsea Genera, MD  Diagnosis:   Stage IB, cT2aN0M0, NSCLC, squamous cell carcinoma of the right upper lobe with a new putative Stage IA2, cT1bN0M0 NSCLC of the LUL.  Interval Since Last Radiation:  6 months  09/01/17-09/10/17 SBRT Treatment:  Rt upper lobe lung treated to 60 Gy in 5 fractions  Narrative:  [name] is a pleasant 72 y.o. female with a known history of stage Ib, cT2aN0M0 NSCLC, squamous cell carcinoma of the right upper lobe.  She was originally seen in February 2019 after cough and fever evaluation led to imaging detecting a 3.5 cm mass in the right upper lobe and a 4 mm lesion in little right lower lobe.  There was an area in the lingula that was approximately 1 cm at that time and no adenopathy was noted.  CT-guided biopsy during her work-up in January revealed a squamous cell carcinoma of the right upper lobe lesion.  Her metastatic work-up was also negative.  She was counseled on the rationale for area tactic body radiotherapy which she completed in 5 fractions to the right upper lobe on 09/10/2017.  She was followed with posttreatment imaging in April which showed improvement of the treated location as well as stability of her right lower lobe lesion and lingular lesion.  She had repeat CT imaging of the chest on 03/01/2018 under the care of Dr. [name].  There was improvement in the right upper lobe lesion, stability of the right lower lobe lesion, but progressive change in the lingula measuring 16 mm.  This extended into the left upper lobe.  On PET imaging on 03/18/2018 hypermetabolism with an SUV of 15 was noted in the left upper lobe lesion, low-level resolution SUV in the 4 range was noted in the previously treated right upper lobe lesion, and no hypermetabolism was noted in  the right lower lobe or in the at mediastinal nodes.  She comes today to discuss options of SBRT to the left upper lobe lesion.          On review of systems, the patient reports that she is doing well overall. 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 She denies any chest pain, shortness of breath, cough, fevers, chills, night sweats, unintended weight changes. She denies any bowel or bladder disturbances, and denies abdominal pain, nausea or vomiting. She continues to have hip pain, but denies any new musculoskeletal or joint aches or pains, new skin lesions or concerns. A complete review of systems is obtained and is otherwise negative.  Past Medical History:  Past Medical History:  Diagnosis Date  . AAA (abdominal aortic aneurysm) ([place])   . Diverticulosis   . GERD (gastroesophageal reflux disease)   . Hiatal hernia   . Hypertension   . Hypokalemia   . Lumbar spondylolysis   . Lung cancer ([place])   . Lung tumor   . On home O2    2L N/C    Past Surgical History: Past Surgical History:  Procedure Laterality Date  . ABDOMINAL AORTIC ANEURYSM REPAIR  10/01/10   endostent repair  . CATARACT EXTRACTION, BILATERAL    . LUNG BIOPSY    . NM MYOVIEW LTD  2012   Normal Lexiscan  Myoview with no ischemia or infarction.  Normal LV function.  EF 67%.  . TONSILLECTOMY    . TRANSTHORACIC ECHOCARDIOGRAM  07/17/2017   EF 35-40%.  Septal mid and basal inferior wall hypokinesis.  Mildly dilated LV.  Mildly reduced EF.  GR 1 DD.  Calcified mitral annulus.     Social History:  Social History   Socioeconomic History  . Marital status: Single    Spouse name: Not on file  . Number of children: Not on file  . Years of education: Not on file  . Highest education level: Not on file  Occupational History  . Not on file  Social Needs  . Financial resource strain: Not on file  . Food insecurity:    Worry: Not on file    Inability:  Not on file  . Transportation needs:    Medical: Not on file    Non-medical: Not on file  Tobacco Use  . Smoking status: Former Smoker    Packs/day: 1.00    Years: 50.00    Pack years: 50.00    Types: Cigarettes    Last attempt to quit: 06/2017    Years since quitting: 0.7  . Smokeless tobacco: Never Used  Substance and Sexual Activity  . Alcohol use: Yes    Comment: rarely  . Drug use: No  . Sexual activity: Not Currently  Lifestyle  . Physical activity:    Days per week: Not on file    Minutes per session: Not on file  . Stress: Not on file  Relationships  . Social connections:    Talks on phone: Not on file    Gets together: Not on file    Attends religious service: Not on file    Active member of club or organization: Not on file    Attends meetings of clubs or organizations: Not on file    Relationship status: Not on file  . Intimate partner violence:    Fear of current or ex partner: Not on file    Emotionally abused: Not on file    Physically abused: Not on file    Forced sexual activity: Not on file  Other Topics Concern  . Not on file  Social History Narrative  . Not on file  The patient is single and lives in [place].  Family History: Family History  Problem Relation Age of Onset  . Heart failure Mother   . Heart attack Mother 19       In 67s - 1st MI  . Coronary artery disease Mother   . Coronary artery disease Father        Died at age 6  . Heart attack Father 8  . Heart disease Maternal Grandmother   . Heart disease Maternal Grandfather   . Cancer Neg Hx      ALLERGIES:  has No Known Allergies.  Meds: Current Outpatient Medications  Medication Sig Dispense Refill  . acetaminophen (TYLENOL) 500 MG tablet Take 500-1,000 mg by mouth every 6 (six) hours as needed for mild pain, moderate pain or headache.    . ipratropium-albuterol (DUONEB) 0.5-2.5 (3) MG/3ML SOLN Take 3 mLs by nebulization every 4 (four) hours as needed (sob). 3 mL 6  .  metoprolol succinate (TOPROL-XL) 50 MG 24 hr tablet Take 1 tablet (50 mg total) by mouth daily. Take with or immediately following a meal. 90 tablet 3  . albuterol (PROVENTIL HFA;VENTOLIN HFA) 108 (90 Base) MCG/ACT inhaler Inhale 2 puffs into the lungs every  4 (four) hours as needed for wheezing or shortness of breath. (Patient not taking: Reported on 03/22/2018) 1 Inhaler 0   No current facility-administered medications for this encounter.     Physical Findings:  height is 5\' 1"  (1.549 m) and weight is 117 lb 9.6 oz (53.3 kg). Her oral temperature is 97.6 F (36.4 C). Her blood pressure is 162/90 (abnormal) and her pulse is 78. Her oxygen saturation is 94%.  Pain Assessment Pain Score: 4  Pain Frequency: Intermittent Pain Loc: Hip(Left hip)/10 In general this is a well appearing caucasian female in no acute distress. She's alert and oriented x4 and appropriate throughout the examination. Cardiopulmonary assessment is negative for acute distress and she exhibits normal effort.   Lab Findings: Lab Results  Component Value Date   WBC 4.9 03/08/2018   HGB 12.8 03/08/2018   HCT 39.8 03/08/2018   MCV 86.2 03/08/2018   PLT 298 03/08/2018     Radiographic Findings: Ct Chest Wo Contrast  Result Date: 03/01/2018 CLINICAL DATA:  Right upper lobe stage I non-small cell lung cancer completed radiation therapy 09/10/2017. Restaging. EXAM: CT CHEST WITHOUT CONTRAST TECHNIQUE: Multidetector CT imaging of the chest was performed following the standard protocol without IV contrast. COMPARISON:  10/21/2017 chest CT. FINDINGS: Cardiovascular: Normal heart size. No significant pericardial effusion/thickening. Left main and 3 vessel coronary atherosclerosis. Atherosclerotic nonaneurysmal thoracic aorta. Normal caliber pulmonary arteries. Mediastinum/Nodes: No discrete thyroid nodules. Unremarkable esophagus. No pathologically enlarged axillary, mediastinal or hilar lymph nodes, noting limited sensitivity for  the detection of hilar adenopathy on this noncontrast study. Lungs/Pleura: No pneumothorax. No pleural effusion. Mild centrilobular emphysema with mild diffuse bronchial wall thickening. Spiculated posterior right upper lobe 2.3 x 1.5 cm pulmonary nodule (series 5/image 38), previously 2.5 x 1.9 cm, mildly decreased. Superior segment right lower lobe 0.4 cm solid pulmonary nodule (series 5/image 55), stable. Left upper lobe spiculated 1.6 cm solid pulmonary nodule (series 5/image 74), increased in size from 1.0 cm and significantly increased in density. No acute consolidative airspace disease or new significant pulmonary nodules. Upper abdomen: Partially visualized stent graft in the abdominal aorta. Musculoskeletal: No aggressive appearing focal osseous lesions. Marked thoracic spondylosis. IMPRESSION: 1. Continued reduction in spiculated posterior right upper lobe pulmonary nodule. 2. Left upper lobe 1.6 cm spiculated pulmonary nodule is increased in size and density, compatible with malignancy, either a second primary bronchogenic carcinoma or contralateral metastasis. 3. No thoracic adenopathy. Aortic Atherosclerosis (ICD10-I70.0) and Emphysema (ICD10-J43.9). Electronically Signed   By: Ilona Sorrel M.D.   On: 03/01/2018 17:20   Nm Pet Image Restag (ps) Skull Base To Thigh  Result Date: 03/18/2018 CLINICAL DATA:  Subsequent treatment strategy for bronchogenic carcinoma. Non-small cell lung cancer. Squamous cell carcinoma EXAM: NUCLEAR MEDICINE PET SKULL BASE TO THIGH TECHNIQUE: 6.2 mCi F-18 FDG was injected intravenously. Full-ring PET imaging was performed from the skull base to thigh after the radiotracer. CT data was obtained and used for attenuation correction and anatomic localization. Fasting blood glucose: 89 mg/dl COMPARISON:  None. FINDINGS: Mediastinal blood pool activity: SUV max 2.1 NECK: No hypermetabolic lymph nodes in the neck. Incidental CT findings: none CHEST: The enlarging nodule in the  LEFT upper lobe now measuring 15 mm (image 31/8) and has intense metabolic activity for size with SUV max equal 15.9. No additional hypermetabolic LEFT lung nodules. The treated nodule in the RIGHT upper lobe has mild metabolic activity with SUV max equal 3.5. Additionally there is metabolic activity diffusely within the chest wall and pleura  adjacent to this treated nodule most consistent with post radiation inflammation (SUV max equal 5.0). Small nodule adjacent to the treatment site in the RIGHT lower lobe measures 6 mm (image 54/4) and not changed from comparison exam. No hypermetabolic mediastinal lymph nodes. No new pulmonary nodules evident within the LEFT or RIGHT lung. Incidental CT findings: none ABDOMEN/PELVIS: No abnormal hypermetabolic activity within the liver, pancreas, adrenal glands, or spleen. No hypermetabolic lymph nodes in the abdomen or pelvis. Incidental CT findings: Abdominal aortic stent graft noted without complication. SKELETON: No aggressive osseous lesion. Incidental CT findings: none IMPRESSION: 1. Intense metabolic activity associated with enlarging LEFT upper lobe pulmonary nodule is most suggestive primary bronchogenic carcinoma. 2. Mild to moderate activity associated treated pulmonary nodule in the RIGHT upper lobe with associated moderate metabolic activity in adjacent chest wall favored post radiation change / inflammation. 3. Stable additional RIGHT lower lobe pulmonary nodule. 4. No new pulmonary nodules evident. 5. No evidence of metastatic mediastinal adenopathy or distant disease. Electronically Signed   By: Suzy Bouchard M.D.   On: 03/18/2018 17:12    Impression/Plan: 1. Putative Stage IA2, cT1bN0M0, NSCLC of the LUL. Dr. Lisbeth Renshaw discusses the imaging findings and concerns regarding the LUL that appears to be another early cancer. Given the high SUV, we do not feel that she requires tissue sampling to confirm this suspicion. We discussed the risks, benefits, short, and  long term effects of radiotherapy, and the patient is interested in proceeding. Dr. Lisbeth Renshaw discusses the delivery and logistics of radiotherapy and anticipates a course of 3 fractions to the LUL. She is in agreement with this plan. Our staff will contact her for simulation.  2. Stage IB, cT2aN0M0, NSCLC, squamous cell carcinoma of the right upper lobe. The patient's prior treatment site is looking good overall and will be followed with subsequent imaging following her treatment for the LUL mass.  3. RLL lesion. This was read as overall stable on her CT and PET. We will follow this expectantly as well.  4. Hip pain. She will follow up with her PCP for management of this.  In a visit lasting 35 minutes, greater than 50% of the time was spent face to face discussing her case, and coordinating the patient's care.  The above documentation reflects my direct findings during this shared patient visit. Please see the separate note by Dr. Lisbeth Renshaw on this date for the remainder of the patient's plan of care.     Carola Rhine, PAC

## 2018-03-22 NOTE — Addendum Note (Signed)
Encounter addended by: Malena Edman, RN on: 03/22/2018 1:46 PM  Actions taken: Charge Capture section accepted

## 2018-03-23 ENCOUNTER — Ambulatory Visit: Payer: PPO

## 2018-03-28 NOTE — Progress Notes (Signed)
HEMATOLOGY/ONCOLOGY CONSULTATION NOTE  Date of Service: 03/29/2018  Patient Care Team: Esaw Grandchild, NP as PCP - General (Family Medicine)  CHIEF COMPLAINTS/PURPOSE OF CONSULTATION:   F/u for recently diagnosed squamous cell lung cancer   HISTORY OF PRESENTING ILLNESS:    Chelsea Baker is a wonderful 72 y.o. female who has been referred to [place] by Dr .Sloan Leiter, Henreitta Leber, MD  for evaluation and management of newly diagnosed Squmous cell carcinoma of the RUL.  Patient has a history of hypertension, nicotine dependence, GERD, hiatal hernia, AAA and presented with a 2-week history of cough, weight loss about 20 pounds in about 6 months and increasing shortness of breath over the last several days prior to admission.  In the ED the patient had a chest x-ray which showed a 3.4 cm Which showed a 3.4 cm right upper lobe mass which appeared cavitary.  Subsequent CT of the chest showed a 3.5 cm cavitary mass involving the right upper lobe concerning for a primary bronchogenic carcinoma.  She was also noted to have a 4 mm nodule which is indeterminate in the right lower lobe.  No evidence of metastatic disease noted in the chest or upper abdomen. No evidence of pulmonary embolism.  Patient subsequently had a CT-guided biopsy of the right upper lobe lung lesion on 07/15/2017.  Pathology revealed squamous cell carcinoma of the lung. She had other workup including an AFB smear which was negative.  Patient notes that she does not really have a primary care physician and has not had good medical follow-up and does not really see a doctor regularly. She has had a history of smoking 1/2-2 packs/day for 50 years.  No diagnosed COPD though likely has significant decline in lung function at baseline. Currently was short of breath despite lung cancer not involving a large part of the lung at this time and no evidence of PE.  Still needing 2-3 L of oxygen by nasal cannula currently and endorses  shortness of breath with minimal walking.  She notes that she lives with her son.   INTERVAL HISTORY   Chelsea Baker is here regarding her lung cancer. The patient's last visit with [place] was on 03/08/18. The pt reports that she is doing well overall.   The pt reports that she will receive three daily treatment of SBRT next week with Dr. Lisbeth Renshaw in [place]. She has been using her Nebulizer once a day and notes that her breathing has been stable and denies chest pains. She denies any constitutional symptoms and denies any new concerns since her last visit.   Of note since the patient's last visit, pt has had a PET/CT completed on 03/18/18 with results revealing Intense metabolic activity associated with enlarging LEFT upper lobe pulmonary nodule is most suggestive primary bronchogenic Carcinoma. 2. Mild to moderate activity associated treated pulmonary nodule in the RIGHT upper lobe with associated moderate metabolic activity in adjacent chest wall favored post radiation change inflammation. 3. Stable additional RIGHT lower lobe pulmonary nodule. 4. No new pulmonary nodules evident. 5. No evidence of metastatic mediastinal adenopathy or distant disease.  On review of systems, pt reports stable breathing, stable energy levels, and denies leg swelling, abdominal pains, CP, and any other symptoms.   MEDICAL HISTORY:  Past Medical History:  Diagnosis Date  . AAA (abdominal aortic aneurysm) ([place])   . Diverticulosis   . GERD (gastroesophageal reflux disease)   . Hiatal hernia   . Hypertension   . Hypokalemia   .  Lumbar spondylolysis   . Lung cancer ([place])   . Lung tumor   . On home O2    2L N/C    SURGICAL HISTORY: Past Surgical History:  Procedure Laterality Date  . ABDOMINAL AORTIC ANEURYSM REPAIR  10/01/10   endostent repair  . CATARACT EXTRACTION, BILATERAL    . LUNG BIOPSY    . NM MYOVIEW LTD  2012   Normal Lexiscan Myoview with no ischemia or infarction.  Normal LV function.  EF 67%.   . TONSILLECTOMY    . TRANSTHORACIC ECHOCARDIOGRAM  07/17/2017   EF 35-40%.  Septal mid and basal inferior wall hypokinesis.  Mildly dilated LV.  Mildly reduced EF.  GR 1 DD.  Calcified mitral annulus.     SOCIAL HISTORY: Social History   Socioeconomic History  . Marital status: Single    Spouse name: Not on file  . Number of children: Not on file  . Years of education: Not on file  . Highest education level: Not on file  Occupational History  . Not on file  Social Needs  . Financial resource strain: Not on file  . Food insecurity:    Worry: Not on file    Inability: Not on file  . Transportation needs:    Medical: Not on file    Non-medical: Not on file  Tobacco Use  . Smoking status: Former Smoker    Packs/day: 1.00    Years: 50.00    Pack years: 50.00    Types: Cigarettes    Last attempt to quit: 06/2017    Years since quitting: 0.7  . Smokeless tobacco: Never Used  Substance and Sexual Activity  . Alcohol use: Yes    Comment: rarely  . Drug use: No  . Sexual activity: Not Currently  Lifestyle  . Physical activity:    Days per week: Not on file    Minutes per session: Not on file  . Stress: Not on file  Relationships  . Social connections:    Talks on phone: Not on file    Gets together: Not on file    Attends religious service: Not on file    Active member of club or organization: Not on file    Attends meetings of clubs or organizations: Not on file    Relationship status: Not on file  . Intimate partner violence:    Fear of current or ex partner: Not on file    Emotionally abused: Not on file    Physically abused: Not on file    Forced sexual activity: Not on file  Other Topics Concern  . Not on file  Social History Narrative  . Not on file    FAMILY HISTORY: Family History  Problem Relation Age of Onset  . Heart failure Mother   . Heart attack Mother 42       In 71s - 1st MI  . Coronary artery disease Mother   . Coronary artery disease  Father        Died at age 24  . Heart attack Father 33  . Heart disease Maternal Grandmother   . Heart disease Maternal Grandfather   . Cancer Neg Hx     ALLERGIES:  has No Known Allergies.  MEDICATIONS:  Current Outpatient Medications  Medication Sig Dispense Refill  . acetaminophen (TYLENOL) 500 MG tablet Take 500-1,000 mg by mouth every 6 (six) hours as needed for mild pain, moderate pain or headache.    . albuterol (PROVENTIL HFA;VENTOLIN HFA)  108 (90 Base) MCG/ACT inhaler Inhale 2 puffs into the lungs every 4 (four) hours as needed for wheezing or shortness of breath. (Patient not taking: Reported on 03/22/2018) 1 Inhaler 0  . ipratropium-albuterol (DUONEB) 0.5-2.5 (3) MG/3ML SOLN Take 3 mLs by nebulization every 4 (four) hours as needed (sob). 3 mL 6  . metoprolol succinate (TOPROL-XL) 50 MG 24 hr tablet Take 1 tablet (50 mg total) by mouth daily. Take with or immediately following a meal. 90 tablet 3   No current facility-administered medications for this visit.     REVIEW OF SYSTEMS:    A 10+ POINT REVIEW OF SYSTEMS WAS OBTAINED including neurology, dermatology, psychiatry, cardiac, respiratory, lymph, extremities, GI, GU, Musculoskeletal, constitutional, breasts, reproductive, HEENT.  All pertinent positives are noted in the HPI.  All others are negative.   PHYSICAL EXAMINATION: ECOG PERFORMANCE STATUS: 3 - Symptomatic, >50% confined to bed  Vitals:   03/29/18 1508  BP: (!) 154/80  Pulse: 83  Resp: 18  Temp: (!) 97.5 F (36.4 C)  SpO2: 97%   Filed Weights   03/29/18 1508  Weight: 116 lb 11.2 oz (52.9 kg)   .Body mass index is 22.05 kg/m. NAD On [place] 2L/min O2  GENERAL:alert, in no acute distress and comfortable SKIN: no acute rashes, no significant lesions EYES: conjunctiva are pink and non-injected, sclera anicteric OROPHARYNX: MMM, no exudates, no oropharyngeal erythema or ulceration NECK: supple, no JVD LYMPH:  no palpable lymphadenopathy in the cervical,  axillary or inguinal regions LUNGS: b/l decreased air entry and scattered rhonchi  HEART: regular rate & rhythm ABDOMEN:  normoactive bowel sounds , non tender, not distended. No palpable hepatosplenomegaly.  Extremity: no pedal edema PSYCH: alert & oriented x 3 with fluent speech NEURO: no focal motor/sensory deficits    LABORATORY DATA:  I have reviewed the data as listed  . CBC Latest Ref Rng & Units 03/08/2018 12/07/2017 11/15/2017  WBC 3.9 - 10.3 K/uL 4.9 5.8 3.9  Hemoglobin 11.6 - 15.9 g/dL 12.8 12.1 11.6  Hematocrit 34.8 - 46.6 % 39.8 39.2 37.2  Platelets 145 - 400 K/uL 298 274 336    . CMP Latest Ref Rng & Units 03/08/2018 12/07/2017 11/15/2017  Glucose 70 - 99 mg/dL 134(H) 113 85  BUN 8 - 23 mg/dL 13 16 13   Creatinine 0.44 - 1.00 mg/dL 1.04(H) 0.87 0.79  Sodium 135 - 145 mmol/L 137 137 136  Potassium 3.5 - 5.1 mmol/L 4.3 3.9 4.9  Chloride 98 - 111 mmol/L 103 104 98  CO2 22 - 32 mmol/L 25 26 22   Calcium 8.9 - 10.3 mg/dL 9.4 9.7 9.6  Total Protein 6.5 - 8.1 g/dL 7.4 6.9 6.5  Total Bilirubin 0.3 - 1.2 mg/dL 0.2(L) <0.2(L) 0.2  Alkaline Phos 38 - 126 U/L 109 104 93  AST 15 - 41 U/L 28 25 24   ALT 0 - 44 U/L 16 23 15     PATHOLOGY       RADIOGRAPHIC STUDIES: I have personally reviewed the radiological images as listed and agreed with the findings in the report. Ct Chest Wo Contrast  Result Date: 03/01/2018 CLINICAL DATA:  Right upper lobe stage I non-small cell lung cancer completed radiation therapy 09/10/2017. Restaging. EXAM: CT CHEST WITHOUT CONTRAST TECHNIQUE: Multidetector CT imaging of the chest was performed following the standard protocol without IV contrast. COMPARISON:  10/21/2017 chest CT. FINDINGS: Cardiovascular: Normal heart size. No significant pericardial effusion/thickening. Left main and 3 vessel coronary atherosclerosis. Atherosclerotic nonaneurysmal thoracic aorta. Normal caliber pulmonary  arteries. Mediastinum/Nodes: No discrete thyroid nodules.  Unremarkable esophagus. No pathologically enlarged axillary, mediastinal or hilar lymph nodes, noting limited sensitivity for the detection of hilar adenopathy on this noncontrast study. Lungs/Pleura: No pneumothorax. No pleural effusion. Mild centrilobular emphysema with mild diffuse bronchial wall thickening. Spiculated posterior right upper lobe 2.3 x 1.5 cm pulmonary nodule (series 5/image 38), previously 2.5 x 1.9 cm, mildly decreased. Superior segment right lower lobe 0.4 cm solid pulmonary nodule (series 5/image 55), stable. Left upper lobe spiculated 1.6 cm solid pulmonary nodule (series 5/image 74), increased in size from 1.0 cm and significantly increased in density. No acute consolidative airspace disease or new significant pulmonary nodules. Upper abdomen: Partially visualized stent graft in the abdominal aorta. Musculoskeletal: No aggressive appearing focal osseous lesions. Marked thoracic spondylosis. IMPRESSION: 1. Continued reduction in spiculated posterior right upper lobe pulmonary nodule. 2. Left upper lobe 1.6 cm spiculated pulmonary nodule is increased in size and density, compatible with malignancy, either a second primary bronchogenic carcinoma or contralateral metastasis. 3. No thoracic adenopathy. Aortic Atherosclerosis (ICD10-I70.0) and Emphysema (ICD10-J43.9). Electronically Signed   By: Ilona Sorrel M.D.   On: 03/01/2018 17:20   Nm Pet Image Restag (ps) Skull Base To Thigh  Result Date: 03/18/2018 CLINICAL DATA:  Subsequent treatment strategy for bronchogenic carcinoma. Non-small cell lung cancer. Squamous cell carcinoma EXAM: NUCLEAR MEDICINE PET SKULL BASE TO THIGH TECHNIQUE: 6.2 mCi F-18 FDG was injected intravenously. Full-ring PET imaging was performed from the skull base to thigh after the radiotracer. CT data was obtained and used for attenuation correction and anatomic localization. Fasting blood glucose: 89 mg/dl COMPARISON:  None. FINDINGS: Mediastinal blood pool activity:  SUV max 2.1 NECK: No hypermetabolic lymph nodes in the neck. Incidental CT findings: none CHEST: The enlarging nodule in the LEFT upper lobe now measuring 15 mm (image 31/8) and has intense metabolic activity for size with SUV max equal 15.9. No additional hypermetabolic LEFT lung nodules. The treated nodule in the RIGHT upper lobe has mild metabolic activity with SUV max equal 3.5. Additionally there is metabolic activity diffusely within the chest wall and pleura adjacent to this treated nodule most consistent with post radiation inflammation (SUV max equal 5.0). Small nodule adjacent to the treatment site in the RIGHT lower lobe measures 6 mm (image 54/4) and not changed from comparison exam. No hypermetabolic mediastinal lymph nodes. No new pulmonary nodules evident within the LEFT or RIGHT lung. Incidental CT findings: none ABDOMEN/PELVIS: No abnormal hypermetabolic activity within the liver, pancreas, adrenal glands, or spleen. No hypermetabolic lymph nodes in the abdomen or pelvis. Incidental CT findings: Abdominal aortic stent graft noted without complication. SKELETON: No aggressive osseous lesion. Incidental CT findings: none IMPRESSION: 1. Intense metabolic activity associated with enlarging LEFT upper lobe pulmonary nodule is most suggestive primary bronchogenic carcinoma. 2. Mild to moderate activity associated treated pulmonary nodule in the RIGHT upper lobe with associated moderate metabolic activity in adjacent chest wall favored post radiation change / inflammation. 3. Stable additional RIGHT lower lobe pulmonary nodule. 4. No new pulmonary nodules evident. 5. No evidence of metastatic mediastinal adenopathy or distant disease. Electronically Signed   By: Suzy Bouchard M.D.   On: 03/18/2018 17:12    ASSESSMENT & PLAN:   CHELISE HANGER is a 72 y.o. female with    1) h/o right upper lobe squamous cell carcinoma of the lung. Stage I Presenting with a cavitary RUL mass.  Biopsy proven to  be squamous cell carcinoma. No overt mediastinal or hilar lymphadenopathy  noted. PET/CT showed no overt metastatic disease CT head neg for mets. Patient declined MRI CT Chest 10/22/17 shows improvement in the right upper lobe mass after [facility]  Patient was treatment with SRS and lesions improved/stabilized  03/01/18 CT Chest revealed Continued reduction in spiculated posterior right upper lobe pulmonary nodule. 2. Left upper lobe 1.6 cm spiculated pulmonary nodule is increased in size and density, compatible with malignancy, either a second primary bronchogenic carcinoma or contralateral metastasis. 3. No thoracic adenopathy.  2) Now with Left upper lobe 1.6 cm spiculated pulmonary nodule- increasing in size.  PLAN: -Recommended that the pt receive a flu vaccine this year -Discussed the 03/18/18 PET/CT which revealed Intense metabolic activity associated with enlarging LEFT upper lobe pulmonary nodule is most suggestive primary bronchogenic Carcinoma. 2. Mild to moderate activity associated treated pulmonary nodule in the RIGHT upper lobe with associated moderate metabolic activity in adjacent chest wall favored post radiation change inflammation. 3. Stable additional RIGHT lower lobe pulmonary nodule. 4. No new pulmonary nodules evident. 5. No evidence of metastatic mediastinal adenopathy or distant disease. -Discussed that her newly identified Left enlarging nodule is suspected to be a new primary, and would be considered a new Stage I tumor. -Continue follow up with Dr. Lisbeth Renshaw in [place] for SBRT -Will repeat CT Chest in 3 months -Recommend PCP Feliz Beam, NP consider referring patient to pulmonology to establish care for mx of COPD -Will see the pt back in 3 months   2) Dyspnea and rest and on minimal exertion. - much improved CT chest with no PE  significant COPD at baseline -  Needing 2-3 L of oxygen by nasal cannula in hospital. and is currently using 2L/min [place] at home ECHO ef  35-40% -Pulmonary function testing -severe obstruction - optimize rx -noted to have systolic cardiomyopathy - mx Dr Ellyn Hack. -optimize mx of COPD with PCP/pulmonologist.  3) Poor medical f/u. Past Medical History:  Diagnosis Date  . AAA (abdominal aortic aneurysm) ([place])   . Diverticulosis   . GERD (gastroesophageal reflux disease)   . Hiatal hernia   . Hypertension   . Hypokalemia   . Lumbar spondylolysis   . Lung cancer ([place])   . Lung tumor   . On home O2    2L N/C   4) Protein calorie malnutrition -nutritional consultation  5) Heavy smoker 75-100 pk-yrs -Pt reported on 08/12/17 that she has completely quit smoking and plans to never smoke again.  -Pt has continued smoking cessation as of 03/08/18 and was encouraged to keep up her good work.   CT chest in 15 weeks RTC with Dr [name] in 16 weeks with labs   All of the patients questions were answered with apparent satisfaction. The patient knows to call the clinic with any problems, questions or concerns.  The total time spent in the appt was 25 minutes and more than 50% was on counseling and direct patient cares.     Sullivan Lone MD MS AAHIVMS [facility] Hematology/Oncology Physician [facility]  (Office):       [phone] (Work cell):  [phone] (Fax):           [phone]  I, Baldwin [place], am acting as a scribe for Dr. [name]  .I have reviewed the above documentation for accuracy and completeness, and I agree with the above. [name] MD

## 2018-03-29 ENCOUNTER — Ambulatory Visit
Admission: RE | Admit: 2018-03-29 | Discharge: 2018-03-29 | Disposition: A | Discharge status: Home / Self Care | Payer: PPO | Source: Ambulatory Visit | Attending: Radiation Oncology | Admitting: Radiation Oncology

## 2018-03-29 ENCOUNTER — Telehealth: Payer: Self-pay | Admitting: Hematology

## 2018-03-29 ENCOUNTER — Inpatient Hospital Stay: Payer: PPO | Attending: Hematology | Admitting: Hematology

## 2018-03-29 VITALS — BP 154/80 | HR 83 | Temp 97.5°F | Resp 18 | Ht 61.0 in | Wt 116.7 lb

## 2018-03-29 DIAGNOSIS — C3411 Malignant neoplasm of upper lobe, right bronchus or lung: Secondary | ICD-10-CM | POA: Insufficient documentation

## 2018-03-29 DIAGNOSIS — E46 Unspecified protein-calorie malnutrition: Secondary | ICD-10-CM

## 2018-03-29 DIAGNOSIS — C3490 Malignant neoplasm of unspecified part of unspecified bronchus or lung: Secondary | ICD-10-CM

## 2018-03-29 DIAGNOSIS — Z510 Encounter for antineoplastic radiation therapy: Secondary | ICD-10-CM | POA: Diagnosis not present

## 2018-03-29 DIAGNOSIS — C3412 Malignant neoplasm of upper lobe, left bronchus or lung: Secondary | ICD-10-CM | POA: Insufficient documentation

## 2018-03-29 NOTE — Telephone Encounter (Signed)
Appts scheduled AVS/Calendar printed per 10/1 los

## 2018-04-01 DIAGNOSIS — C3412 Malignant neoplasm of upper lobe, left bronchus or lung: Secondary | ICD-10-CM | POA: Diagnosis not present

## 2018-04-01 DIAGNOSIS — Z510 Encounter for antineoplastic radiation therapy: Secondary | ICD-10-CM | POA: Diagnosis not present

## 2018-04-01 DIAGNOSIS — C3411 Malignant neoplasm of upper lobe, right bronchus or lung: Secondary | ICD-10-CM | POA: Diagnosis not present

## 2018-04-06 ENCOUNTER — Ambulatory Visit
Admission: RE | Admit: 2018-04-06 | Discharge: 2018-04-06 | Disposition: A | Discharge status: Home / Self Care | Payer: PPO | Source: Ambulatory Visit | Attending: Radiation Oncology | Admitting: Radiation Oncology

## 2018-04-06 DIAGNOSIS — Z510 Encounter for antineoplastic radiation therapy: Secondary | ICD-10-CM | POA: Diagnosis not present

## 2018-04-08 ENCOUNTER — Ambulatory Visit
Admission: RE | Admit: 2018-04-08 | Discharge: 2018-04-08 | Disposition: A | Discharge status: Home / Self Care | Payer: PPO | Source: Ambulatory Visit | Attending: Radiation Oncology | Admitting: Radiation Oncology

## 2018-04-08 DIAGNOSIS — Z510 Encounter for antineoplastic radiation therapy: Secondary | ICD-10-CM | POA: Diagnosis not present

## 2018-04-11 ENCOUNTER — Ambulatory Visit: Payer: PPO | Admitting: Radiation Oncology

## 2018-04-13 ENCOUNTER — Ambulatory Visit
Admission: RE | Admit: 2018-04-13 | Discharge: 2018-04-13 | Disposition: A | Discharge status: Home / Self Care | Payer: PPO | Source: Ambulatory Visit | Attending: Radiation Oncology | Admitting: Radiation Oncology

## 2018-04-13 ENCOUNTER — Encounter: Payer: Self-pay | Admitting: Radiation Oncology

## 2018-04-13 DIAGNOSIS — C3412 Malignant neoplasm of upper lobe, left bronchus or lung: Secondary | ICD-10-CM | POA: Diagnosis not present

## 2018-04-13 DIAGNOSIS — C3411 Malignant neoplasm of upper lobe, right bronchus or lung: Secondary | ICD-10-CM | POA: Diagnosis not present

## 2018-04-13 DIAGNOSIS — Z510 Encounter for antineoplastic radiation therapy: Secondary | ICD-10-CM | POA: Diagnosis not present

## 2018-04-21 NOTE — Progress Notes (Signed)
  Radiation Oncology         [phone] ________________________________  Name: Chelsea Baker MRN: [id number]  Date: 04/13/2018  DOB: February 05, 1946  End of Treatment Note  Diagnosis:   72 y.o. female with Putative Stage IA2, cT1bN0M0, NSCLC of the LUL  Indication for treatment:  Curative       Radiation treatment dates:   04/06/2018, 04/08/2018, 04/13/2018   Site/dose:   The tumor in the LUL was treated with a course of stereotactic body radiation treatment. The patient received 54 Gy in 3 fractions at 18 Gy per fraction.  Beams/energy:   SBRT/SRT-VMAT // 6X-FFF Photon  Narrative: The patient tolerated radiation treatment relatively well.   The patient did not have any signs of acute toxicity during treatment.  Plan: The patient has completed radiation treatment. The patient will return to radiation oncology clinic for routine followup in one month. I advised the patient to call or return sooner if they have any questions or concerns related to their recovery or treatment.   ------------------------------------------------  Jodelle Gross, MD, PhD  This document serves as a record of services personally performed by Kyung Rudd, MD. It was created on his behalf by Rae Lips, a trained medical scribe. The creation of this record is based on the scribe's personal observations and the provider's statements to them. This document has been checked and approved by the attending provider.

## 2018-05-11 NOTE — Progress Notes (Signed)
[facility] Simulation and Treatment Planning Note   Name:  Chelsea Baker MRN: [id number]   Date: 05/11/2018  DOB: 06/05/1946  Status:outpatient    DIAGNOSIS:    ICD-10-CM   1. Malignant neoplasm of upper lobe of left lung ([place]) C34.12   2. Malignant neoplasm of right upper lobe of lung ([facility]) C34.11      CONSENT VERIFIED:yes   SET UP: Patient is setup supine   IMMOBILIZATION: The patient was immobilized using a customized Vac Loc bag/ blue bag and customized accuform device   NARRATIVE:The patient was brought to the [facility].  Identity was confirmed.  All relevant records and images related to the planned course of therapy were reviewed.  Then, the patient was positioned in a stable reproducible clinical set-up for radiation therapy. Abdominal compression was applied by me.  4D CT images were obtained and reproducible breathing pattern was confirmed. Free breathing CT images were obtained.  Skin markings were placed.  The CT images were loaded into the planning software where the target and avoidance structures were contoured.  The radiation prescription was entered and confirmed.    TREATMENT PLANNING NOTE:  Treatment planning then occurred. I have requested : IMRT planning.This treatment technique is medically necessary due to the high-dose of radiation delivered to the target region which is in close proximity to adjacent critical normal structures.  3 dimensional simulation is performed and dose volume histogram of the gross tumor volume, planning tumor volume and criticial normal structures including the spinal cord and lungs were analyzed and requested.  Special treatment procedure was performed due to high dose per fraction.  The patient will be monitored for increased risk of toxicity.  Daily imaging using cone beam CT will be used for target localization.  I anticipate that the patient will receive 54 Gy in 3  fractions to target volume. Further adjustments will be made based on the planning process is necessary.  ------------------------------------------------  Jodelle Gross, MD, PhD

## 2018-05-16 ENCOUNTER — Other Ambulatory Visit: Payer: Self-pay

## 2018-05-16 ENCOUNTER — Encounter: Payer: Self-pay | Admitting: Radiation Oncology

## 2018-05-16 ENCOUNTER — Ambulatory Visit
Admission: RE | Admit: 2018-05-16 | Discharge: 2018-05-16 | Disposition: A | Discharge status: Home / Self Care | Payer: PPO | Source: Ambulatory Visit | Attending: Radiation Oncology | Admitting: Radiation Oncology

## 2018-05-16 VITALS — BP 154/72 | HR 103 | Temp 98.0°F | Resp 16 | Ht 61.0 in | Wt 120.0 lb

## 2018-05-16 DIAGNOSIS — I10 Essential (primary) hypertension: Secondary | ICD-10-CM | POA: Insufficient documentation

## 2018-05-16 DIAGNOSIS — C3411 Malignant neoplasm of upper lobe, right bronchus or lung: Secondary | ICD-10-CM | POA: Diagnosis not present

## 2018-05-16 DIAGNOSIS — Z79899 Other long term (current) drug therapy: Secondary | ICD-10-CM | POA: Insufficient documentation

## 2018-05-16 DIAGNOSIS — C3412 Malignant neoplasm of upper lobe, left bronchus or lung: Secondary | ICD-10-CM | POA: Insufficient documentation

## 2018-05-16 NOTE — Progress Notes (Signed)
Radiation Oncology         [phone]) [phone] ________________________________  Name: Chelsea Baker MRN: [id number]  Date of Service: 05/16/2018 DOB: Oct 21, 1945  Reconsult Note  CC: Esaw Grandchild, NP  [name], MD  Diagnosis:   Stage IB, cT2aN0M0, NSCLC, squamous cell carcinoma of the right upper lobe with a new putative Stage IA2, cT1bN0M0 NSCLC of the LUL.  Interval Since Last Radiation:  1 month  04/06/18-04/13/18 SBRT Treatment: LUL tumor was treated to 54 Gy in 3 fractions  09/01/17-09/10/17 SBRT Treatment:  RUL tumor treated to 60 Gy in 5 fractions  Narrative:  Chelsea Baker is a pleasant 72 y.o. female with a known history of stage Ib, cT2aN0M0 NSCLC, squamous cell carcinoma of the right upper lobe.  She was originally seen in February 2019 after cough and fever evaluation led to imaging detecting a 3.5 cm mass in the right upper lobe and a 4 mm lesion in little right lower lobe.  There was an area in the lingula that was approximately 1 cm at that time and no adenopathy was noted.  CT-guided biopsy during her work-up in January revealed a squamous cell carcinoma of the right upper lobe lesion.  Her metastatic work-up was also negative.  She was counseled on the rationale for area tactic body radiotherapy which she completed in 5 fractions to the right upper lobe on 09/10/2017.  She was followed with posttreatment imaging in April which showed improvement of the treated location as well as stability of her right lower lobe lesion and lingular lesion.  She had repeat CT imaging of the chest on 03/01/2018 under the care of Dr. [name].  There was improvement in the right upper lobe lesion, stability of the right lower lobe lesion, but progressive change in the lingula measuring 16 mm.  This extended into the left upper lobe.  On PET imaging on 03/18/2018 hypermetabolism with an SUV of 15 was noted in the left upper lobe lesion, low-level resolution SUV in the 4 range was noted in the  previously treated right upper lobe lesion, and no hypermetabolism was noted in the right lower lobe or in the at mediastinal nodes.  She completed 3 fractions of SBRT to the left upper lobe lesion and comes today for follow up.          On review of systems, the patient reports that she is doing well overall. She reports occasional cough and feeling stuffy when working. She is sensitive to perfumes and fragrances. She denies any chest pain, shortness of breath, cough, fevers, chills, night sweats, unintended weight changes. She denies any bowel or bladder disturbances, and denies abdominal pain, nausea or vomiting. She continues to have left hip pain and has not been seen for this yet. She denies any new musculoskeletal or joint aches or pains, new skin lesions or concerns. A complete review of systems is obtained and is otherwise negative.   ALLERGIES:  has No Known Allergies.  Meds: Current Outpatient Medications  Medication Sig Dispense Refill  . acetaminophen (TYLENOL) 500 MG tablet Take 500-1,000 mg by mouth every 6 (six) hours as needed for mild pain, moderate pain or headache.    . albuterol (PROVENTIL HFA;VENTOLIN HFA) 108 (90 Base) MCG/ACT inhaler Inhale 2 puffs into the lungs every 4 (four) hours as needed for wheezing or shortness of breath. 1 Inhaler 0  . metoprolol succinate (TOPROL-XL) 50 MG 24 hr tablet Take 1 tablet (50 mg total) by mouth daily. Take with  or immediately following a meal. 90 tablet 3  . ipratropium-albuterol (DUONEB) 0.5-2.5 (3) MG/3ML SOLN Take 3 mLs by nebulization every 4 (four) hours as needed (sob). (Patient not taking: Reported on 05/16/2018) 3 mL 6   No current facility-administered medications for this encounter.     Physical Findings:  height is 5\' 1"  (1.549 m) and weight is 120 lb (54.4 kg). Her oral temperature is 98 F (36.7 C). Her blood pressure is 154/72 (abnormal) and her pulse is 103 (abnormal). Her respiration is 16 and oxygen saturation is 94%.   Pain Assessment Pain Score: 0-No pain/10 In general this is a well appearing caucasian female in no acute distress. She's alert and oriented x4 and appropriate throughout the examination. Cardiopulmonary assessment is negative for acute distress and she exhibits normal effort with RRR, no C/R/M noted, and clear to ausculation bilaterally.   Lab Findings: Lab Results  Component Value Date   WBC 4.9 03/08/2018   HGB 12.8 03/08/2018   HCT 39.8 03/08/2018   MCV 86.2 03/08/2018   PLT 298 03/08/2018     Radiographic Findings: No results found.  Impression/Plan: 1. Putative Stage IA2, cT1bN0M0, NSCLC of the LUL. The patient is doing well since completion of her radiotherapy. She will be seeing Dr. [name] in January for her post treatment CT scan. We would be happy to follow her as well, but anticipate that she will be sent back to [place] as needed. 2. Stage IB, cT2aN0M0, NSCLC, squamous cell carcinoma of the right upper lobe. The patient's prior treatment site was stable prior to treatment of her left lung, and will be followed with subsequent imaging following her treatment for the LUL mass.  3. RLL lesion. This was read as overall stable on her CT and PET. This will be followed expectantly. 4. Left hip pain and hypertension. I encouraged the patient to follow up with her PCP regarding these issues. She will call this week.       Carola Rhine, PAC

## 2018-06-07 NOTE — Progress Notes (Signed)
Subjective:    Patient ID: Chelsea Baker, female    DOB: May 04, 1946, 72 y.o.   MRN: [id number]  HPI:  Ms. Daughdrill presents with productive cough (thick clear/yellow mucus), clear nasal drainage and extreme fatigue that has been worsening the last 1.5 weeks. She reports 2 weeks ago "I think I had the flu" with the following sx's- nausea without vomiting, diarrhea, HA, nasal drainage, chills, night sweats, and fever. She has been using Albuterol, Duoneb, and Ibuprofen- last dose of NSAID was yesterday, current temp 98.8 f She denies CP/palpitations or increase in baseline dyspnea  Of Note- Sept 2019 CT :  03/01/18 with results revealing Continued reduction in spiculated posterior right upper lobe pulmonary nodule. 2. Left upper lobe 1.6 cm spiculated pulmonary nodule is increased in size and density, compatible with malignancy, either a second primary bronchogenic carcinoma or contralateral metastasis. She underwent three radiation treatments Oct 2019, she will have repeat CT Jan 2020 and f/u with Oncology afterwards  Patient Care Team    Relationship Specialty Notifications Start End  Esaw Grandchild, NP PCP - General Family Medicine  08/25/17     Patient Active Problem List   Diagnosis Date Noted  . Malignant neoplasm of upper lobe of left lung ([place]) 03/22/2018  . Healthcare maintenance 12/21/2017  . Hyperlipidemia 12/21/2017  . Hypertension 08/25/2017  . Chest discomfort 08/10/2017  . Abnormal echocardiogram 08/10/2017  . Hypoxia   . Dilated cardiomyopathy ([place]) 07/17/2017  . Shortness of breath   . Protein-calorie malnutrition ([place])   . Malignant neoplasm of upper lobe of right lung ([place]) 07/12/2017  . Tachycardia 07/12/2017  . Hyponatremia 07/12/2017  . Cavitating mass in right upper lung lobe 07/12/2017  . AAA (abdominal aortic aneurysm) without rupture ([place]) 04/10/2014  . Aftercare following surgery of the circulatory system 04/10/2014  . Aftercare following surgery of the  circulatory system, [place] 11/15/2012     Past Medical History:  Diagnosis Date  . AAA (abdominal aortic aneurysm) ([place])   . Diverticulosis   . GERD (gastroesophageal reflux disease)   . Hiatal hernia   . Hypertension   . Hypokalemia   . Lumbar spondylolysis   . Lung cancer ([place])   . Lung tumor   . On home O2    2L N/C     Past Surgical History:  Procedure Laterality Date  . ABDOMINAL AORTIC ANEURYSM REPAIR  10/01/10   endostent repair  . CATARACT EXTRACTION, BILATERAL    . LUNG BIOPSY    . NM MYOVIEW LTD  2012   Normal Lexiscan Myoview with no ischemia or infarction.  Normal LV function.  EF 67%.  . TONSILLECTOMY    . TRANSTHORACIC ECHOCARDIOGRAM  07/17/2017   EF 35-40%.  Septal mid and basal inferior wall hypokinesis.  Mildly dilated LV.  Mildly reduced EF.  GR 1 DD.  Calcified mitral annulus.      Family History  Problem Relation Age of Onset  . Heart failure Mother   . Heart attack Mother 76       In 49s - 1st MI  . Coronary artery disease Mother   . Coronary artery disease Father        Died at age 59  . Heart attack Father 56  . Heart disease Maternal Grandmother   . Heart disease Maternal Grandfather   . Cancer Neg Hx      Social History   Substance and Sexual Activity  Drug Use No     Social History  Substance and Sexual Activity  Alcohol Use Yes   Comment: rarely     Social History   Tobacco Use  Smoking Status Former Smoker  . Packs/day: 1.00  . Years: 50.00  . Pack years: 50.00  . Types: Cigarettes  . Last attempt to quit: 06/2017  . Years since quitting: 0.9  Smokeless Tobacco Never Used     Outpatient Encounter Medications as of 06/08/2018  Medication Sig  . albuterol (PROVENTIL HFA;VENTOLIN HFA) 108 (90 Base) MCG/ACT inhaler Inhale 2 puffs into the lungs every 4 (four) hours as needed for wheezing or shortness of breath.  Marland Kitchen ibuprofen (ADVIL,MOTRIN) 200 MG tablet Take 400 mg by mouth every 6 (six) hours as needed.  Marland Kitchen  ipratropium-albuterol (DUONEB) 0.5-2.5 (3) MG/3ML SOLN Take 3 mLs by nebulization every 4 (four) hours as needed (sob).  . metoprolol succinate (TOPROL-XL) 50 MG 24 hr tablet Take 1 tablet (50 mg total) by mouth daily. Take with or immediately following a meal.  . azithromycin (ZITHROMAX) 250 MG tablet 2 tabs day one. 1 tab days two- five  . benzonatate (TESSALON) 200 MG capsule Take 1 capsule (200 mg total) by mouth 2 (two) times daily as needed for cough.  . [DISCONTINUED] acetaminophen (TYLENOL) 500 MG tablet Take 500-1,000 mg by mouth every 6 (six) hours as needed for mild pain, moderate pain or headache.  . [DISCONTINUED] chlorpheniramine-HYDROcodone (TUSSIONEX) 10-8 MG/5ML SUER Take 5 mLs by mouth every 12 (twelve) hours as needed.   No facility-administered encounter medications on file as of 06/08/2018.     Allergies: Patient has no known allergies.  Body mass index is 22.86 kg/m.  Blood pressure (!) 144/84, pulse 98, temperature 98.8 F (37.1 C), temperature source Oral, height 5\' 1"  (1.549 m), weight 121 lb (54.9 kg), SpO2 95 %.  Review of Systems  Constitutional: Positive for fatigue. Negative for activity change, appetite change, chills, diaphoresis, fever and unexpected weight change.  HENT: Positive for congestion and postnasal drip. Negative for ear pain, sore throat and voice change.   Eyes: Negative for visual disturbance.  Respiratory: Positive for cough and shortness of breath. Negative for chest tightness and wheezing.   Cardiovascular: Negative for chest pain, palpitations and leg swelling.  Gastrointestinal: Negative for abdominal distention, blood in stool, constipation, diarrhea, nausea and vomiting.  Endocrine: Negative for cold intolerance, heat intolerance, polydipsia, polyphagia and polyuria.  Neurological: Negative for dizziness and headaches.  Psychiatric/Behavioral: Positive for sleep disturbance.       Objective:   Physical Exam  Constitutional: She  is oriented to person, place, and time. She appears well-developed and well-nourished. No distress.  HENT:  Head: Normocephalic and atraumatic.  Right Ear: External ear normal. Tympanic membrane is not erythematous and not bulging. No decreased hearing is noted.  Left Ear: External ear normal. Tympanic membrane is not erythematous and not bulging. No decreased hearing is noted.  Nose: Mucosal edema and rhinorrhea present. Right sinus exhibits no maxillary sinus tenderness and no frontal sinus tenderness. Left sinus exhibits no maxillary sinus tenderness and no frontal sinus tenderness.  Mouth/Throat: Oropharynx is clear and moist and mucous membranes are normal. Abnormal dentition. No posterior oropharyngeal edema or posterior oropharyngeal erythema. Tonsils are 0 on the right. Tonsils are 0 on the left.  Eyes: Pupils are equal, round, and reactive to light. Conjunctivae and EOM are normal.  Neck: Normal range of motion. Neck supple.  Cardiovascular: Normal rate, regular rhythm, normal heart sounds and intact distal pulses.  No murmur heard. Pulmonary/Chest:  Effort normal and breath sounds normal. No stridor. No respiratory distress. She has no decreased breath sounds. She has no wheezes. She has no rhonchi. She has no rales. She exhibits no tenderness.  Lymphadenopathy:    She has no cervical adenopathy.  Neurological: She is alert and oriented to person, place, and time.  Skin: Skin is warm and dry. Capillary refill takes less than 2 seconds. No rash noted. She is not diaphoretic. No erythema. No pallor.  Psychiatric: She has a normal mood and affect. Her behavior is normal. Judgment and thought content normal.  Nursing note and vitals reviewed.     Assessment & Plan:   1. Shortness of breath   2. Malignant neoplasm of upper lobe of right lung ([facility])     Shortness of breath Please take Azithromycin as directed. Please use Tessalon as needed. Use ProAir and Duoneb as needed- unable to  add steroid to Neb. Referral to Pulmonologist placed. If symptoms persist after antibiotic completed, call clinic. Please schedule follow-up in 6 months with fasting labs.  FOLLOW-UP:  Return in about 3 months (around 09/07/2018).

## 2018-06-08 ENCOUNTER — Encounter: Payer: Self-pay | Admitting: Adult Health

## 2018-06-08 ENCOUNTER — Ambulatory Visit (INDEPENDENT_AMBULATORY_CARE_PROVIDER_SITE_OTHER): Payer: PPO | Admitting: Adult Health

## 2018-06-08 VITALS — BP 144/84 | HR 98 | Temp 98.8°F | Ht 61.0 in | Wt 121.0 lb

## 2018-06-08 DIAGNOSIS — C3411 Malignant neoplasm of upper lobe, right bronchus or lung: Secondary | ICD-10-CM

## 2018-06-08 DIAGNOSIS — R0602 Shortness of breath: Secondary | ICD-10-CM | POA: Diagnosis not present

## 2018-06-08 MED ORDER — AZITHROMYCIN 250 MG PO TABS: ORAL_TABLET | ORAL | 0 refills | Status: DC

## 2018-06-08 MED ORDER — HYDROCOD POLST-CPM POLST ER 10-8 MG/5ML PO SUER: 5.0000 mL | Freq: Two times a day (BID) | ORAL | 0 refills | Status: DC | PRN

## 2018-06-08 MED ORDER — BENZONATATE 200 MG PO CAPS: 200.0000 mg | ORAL_CAPSULE | Freq: Two times a day (BID) | ORAL | 0 refills | Status: DC | PRN

## 2018-06-08 NOTE — Assessment & Plan Note (Signed)
Please take Azithromycin as directed. Please use Tessalon as needed. Use ProAir and Duoneb as needed- unable to add steroid to Neb. Referral to Pulmonologist placed. If symptoms persist after antibiotic completed, call clinic. Please schedule follow-up in 6 months with fasting labs.

## 2018-06-08 NOTE — Patient Instructions (Addendum)
Acute Bronchitis, Adult Acute bronchitis is sudden (acute) swelling of the air tubes (bronchi) in the lungs. Acute bronchitis causes these tubes to fill with mucus, which can make it hard to breathe. It can also cause coughing or wheezing. In adults, acute bronchitis usually goes away within 2 weeks. A cough caused by bronchitis may last up to 3 weeks. Smoking, allergies, and asthma can make the condition worse. Repeated episodes of bronchitis may cause further lung problems, such as chronic obstructive pulmonary disease (COPD). What are the causes? This condition can be caused by germs and by substances that irritate the lungs, including:  Cold and flu viruses. This condition is most often caused by the same virus that causes a cold.  Bacteria.  Exposure to tobacco smoke, dust, fumes, and air pollution.  What increases the risk? This condition is more likely to develop in people who:  Have close contact with someone with acute bronchitis.  Are exposed to lung irritants, such as tobacco smoke, dust, fumes, and vapors.  Have a weak immune system.  Have a respiratory condition such as asthma.  What are the signs or symptoms? Symptoms of this condition include:  A cough.  Coughing up clear, yellow, or green mucus.  Wheezing.  Chest congestion.  Shortness of breath.  A fever.  Body aches.  Chills.  A sore throat.  How is this diagnosed? This condition is usually diagnosed with a physical exam. During the exam, your health care provider may order tests, such as chest X-rays, to rule out other conditions. He or she may also:  Test a sample of your mucus for bacterial infection.  Check the level of oxygen in your blood. This is done to check for pneumonia.  Do a chest X-ray or lung function testing to rule out pneumonia and other conditions.  Perform blood tests.  Your health care provider will also ask about your symptoms and medical history. How is this  treated? Most cases of acute bronchitis clear up over time without treatment. Your health care provider may recommend:  Drinking more fluids. Drinking more makes your mucus thinner, which may make it easier to breathe.  Taking a medicine for a fever or cough.  Taking an antibiotic medicine.  Using an inhaler to help improve shortness of breath and to control a cough.  Using a cool mist vaporizer or humidifier to make it easier to breathe.  Follow these instructions at home: Medicines  Take over-the-counter and prescription medicines only as told by your health care provider.  If you were prescribed an antibiotic, take it as told by your health care provider. Do not stop taking the antibiotic even if you start to feel better. General instructions  Get plenty of rest.  Drink enough fluids to keep your urine clear or pale yellow.  Avoid smoking and secondhand smoke. Exposure to cigarette smoke or irritating chemicals will make bronchitis worse. If you smoke and you need help quitting, ask your health care provider. Quitting smoking will help your lungs heal faster.  Use an inhaler, cool mist vaporizer, or humidifier as told by your health care provider.  Keep all follow-up visits as told by your health care provider. This is important. How is this prevented? To lower your risk of getting this condition again:  Wash your hands often with soap and water. If soap and water are not available, use hand sanitizer.  Avoid contact with people who have cold symptoms.  Try not to touch your hands to your   mouth, nose, or eyes.  Make sure to get the flu shot every year.  Contact a health care provider if:  Your symptoms do not improve in 2 weeks of treatment. Get help right away if:  You cough up blood.  You have chest pain.  You have severe shortness of breath.  You become dehydrated.  You faint or keep feeling like you are going to faint.  You keep vomiting.  You have a  severe headache.  Your fever or chills gets worse. This information is not intended to replace advice given to you by your health care provider. Make sure you discuss any questions you have with your health care provider. Document Released: 07/23/2004 Document Revised: 01/08/2016 Document Reviewed: 12/04/2015 Elsevier Interactive Patient Education  2018 [employer].  Please take Azithromycin as directed. Please use Tessalon as needed. Use ProAir and Duoneb as needed- unable to add steroid to Neb. Referral to Pulmonologist placed. If symptoms persist after antibiotic completed, call clinic. Please schedule follow-up in 6 months with fasting labs. NICE TO SEE YOU!

## 2018-07-05 ENCOUNTER — Ambulatory Visit (INDEPENDENT_AMBULATORY_CARE_PROVIDER_SITE_OTHER): Payer: PPO | Admitting: Pulmonary Disease

## 2018-07-05 ENCOUNTER — Encounter: Payer: Self-pay | Admitting: Pulmonary Disease

## 2018-07-05 VITALS — BP 120/64 | HR 104 | Ht 61.0 in | Wt 120.6 lb

## 2018-07-05 DIAGNOSIS — J441 Chronic obstructive pulmonary disease with (acute) exacerbation: Secondary | ICD-10-CM

## 2018-07-05 MED ORDER — ALBUTEROL SULFATE HFA 108 (90 BASE) MCG/ACT IN AERS: 2.0000 | INHALATION_SPRAY | RESPIRATORY_TRACT | 3 refills | Status: DC | PRN

## 2018-07-05 MED ORDER — PREDNISONE 10 MG PO TABS: 10.0000 mg | ORAL_TABLET | Freq: Two times a day (BID) | ORAL | 0 refills | Status: AC

## 2018-07-05 MED ORDER — DOXYCYCLINE HYCLATE 100 MG PO TABS: 100.0000 mg | ORAL_TABLET | Freq: Two times a day (BID) | ORAL | 0 refills | Status: AC

## 2018-07-05 NOTE — Progress Notes (Signed)
Chelsea Baker    [id number]    [birth year]  Primary Care Physician:Danford, Berna Spare, NP  Referring Physician: Esaw Grandchild, NP [place], [place] [address]  Chief complaint:   Cough, occasional shortness of breath History of multiple lung nodules  HPI:  Patient with a history of obstructive lung disease, significant smoking history Diagnosed with squamous cell lung cancer in 2019 for which she received radiation treatments A new nodule was found in the left upper lobe-received radiation treatments PET scan did show activity in the left upper lobe nodule in October 2019 She has a follow-up CT scan scheduled for January 2020  Denies any chest pains or chest discomfort Has been coughing more than usual Bringing up some clear secretions, no hemoptysis Weight has been stable, though did lose weight recently when she had a more significant exacerbation of her symptoms  She uses albuterol, has a nebulizer that she uses as needed  She is very active, able to walk on a regular basis She still works, she states she walks almost 6 miles a day    Pets: 3 dogs Occupation: Works in Scientist, research (medical) Exposures: Smoking history: Quit in 2019  Outpatient Encounter Medications as of 73/12/2018  Medication Sig  . albuterol (PROVENTIL HFA;VENTOLIN HFA) 108 (90 Base) MCG/ACT inhaler Inhale 2 puffs into the lungs every 4 (four) hours as needed for wheezing or shortness of breath.  Marland Kitchen ibuprofen (ADVIL,MOTRIN) 200 MG tablet Take 400 mg by mouth every 6 (six) hours as needed.  Marland Kitchen ipratropium-albuterol (DUONEB) 0.5-2.5 (3) MG/3ML SOLN Take 3 mLs by nebulization every 4 (four) hours as needed (sob).  . metoprolol succinate (TOPROL-XL) 50 MG 24 hr tablet Take 1 tablet (50 mg total) by mouth daily. Take with or immediately following a meal.  . [DISCONTINUED] azithromycin (ZITHROMAX) 250 MG tablet 2 tabs day one. 1 tab days two- five (Patient not taking: Reported on 07/05/2018)  .  [DISCONTINUED] benzonatate (TESSALON) 200 MG capsule Take 1 capsule (200 mg total) by mouth 2 (two) times daily as needed for cough. (Patient not taking: Reported on 07/05/2018)   No facility-administered encounter medications on file as of 73/12/2018.     Allergies as of 07/05/2018  . (No Known Allergies)    Past Medical History:  Diagnosis Date  . AAA (abdominal aortic aneurysm) ([place])   . Diverticulosis   . GERD (gastroesophageal reflux disease)   . Hiatal hernia   . Hypertension   . Hypokalemia   . Lumbar spondylolysis   . Lung cancer ([place])   . Lung tumor   . On home O2    2L N/C    Past Surgical History:  Procedure Laterality Date  . ABDOMINAL AORTIC ANEURYSM REPAIR  10/01/10   endostent repair  . CATARACT EXTRACTION, BILATERAL    . LUNG BIOPSY    . NM MYOVIEW LTD  2012   Normal Lexiscan Myoview with no ischemia or infarction.  Normal LV function.  EF 67%.  . TONSILLECTOMY    . TRANSTHORACIC ECHOCARDIOGRAM  07/17/2017   EF 35-40%.  Septal mid and basal inferior wall hypokinesis.  Mildly dilated LV.  Mildly reduced EF.  GR 1 DD.  Calcified mitral annulus.     Family History  Problem Relation Age of Onset  . Heart failure Mother   . Heart attack Mother 79       In 68s - 1st MI  . Coronary artery disease Mother   .  Coronary artery disease Father        Died at age 74  . Heart attack Father 4  . Heart disease Maternal Grandmother   . Heart disease Maternal Grandfather   . Cancer Neg Hx     Social History   Socioeconomic History  . Marital status: Single    Spouse name: Not on file  . Number of children: Not on file  . Years of education: Not on file  . Highest education level: Not on file  Occupational History  . Not on file  Social Needs  . Financial resource strain: Not on file  . Food insecurity:    Worry: Not on file    Inability: Not on file  . Transportation needs:    Medical: Not on file    Non-medical: Not on file  Tobacco Use  . Smoking  status: Former Smoker    Packs/day: 1.00    Years: 50.00    Pack years: 50.00    Types: Cigarettes    Last attempt to quit: 06/2017    Years since quitting: 1.0  . Smokeless tobacco: Never Used  Substance and Sexual Activity  . Alcohol use: Yes    Comment: rarely  . Drug use: No  . Sexual activity: Not Currently  Lifestyle  . Physical activity:    Days per week: Not on file    Minutes per session: Not on file  . Stress: Not on file  Relationships  . Social connections:    Talks on phone: Not on file    Gets together: Not on file    Attends religious service: Not on file    Active member of club or organization: Not on file    Attends meetings of clubs or organizations: Not on file    Relationship status: Not on file  . Intimate partner violence:    Fear of current or ex partner: No    Emotionally abused: No    Physically abused: No    Forced sexual activity: No  Other Topics Concern  . Not on file  Social History Narrative  . Not on file    Review of Systems  Constitutional: Positive for fatigue.  HENT: Negative.   Eyes: Negative.   Respiratory: Positive for cough. Negative for apnea and shortness of breath.   Cardiovascular: Negative.   Gastrointestinal: Negative.   Endocrine: Negative.   Genitourinary: Negative.     Vitals:   07/05/18 0903  BP: 120/64  Pulse: (!) 104  SpO2: 96%     Physical Exam  Constitutional: She is oriented to person, place, and time. She appears well-developed and well-nourished.  HENT:  Head: Normocephalic and atraumatic.  Eyes: Pupils are equal, round, and reactive to light. Conjunctivae and EOM are normal. Right eye exhibits no discharge. Left eye exhibits no discharge.  Neck: Normal range of motion. Neck supple. No tracheal deviation present. No thyromegaly present.  Cardiovascular: Normal rate.  Pulmonary/Chest: Effort normal and breath sounds normal. No respiratory distress. She has no wheezes. She has no rales. She exhibits  no tenderness.  Abdominal: Soft. Bowel sounds are normal. She exhibits no distension.  Musculoskeletal: Normal range of motion.        General: No edema.  Neurological: She is alert and oriented to person, place, and time. No cranial nerve deficit.  Skin: Skin is warm and dry.   Data Reviewed: CT scan April 2019, September 2019, PET scan October 2019 reviewed with the patient-films reviewed  Assessment:  Chronic obstructive pulmonary disease -Severity is unclear at present as she is not had a PFT in the past, an attempt in the past was not successful as she was having a lot of symptoms at the time -She continues on albuterol MDI and DuoNeb as needed -Appears to be exacerbated at the present time with increasing cough, some shortness of breath, increased phlegm production  COPD exacerbation  History of lung cancer -Has had 2 courses of radiation treatments-initially to right upper lobe lesion with a diagnosis of squamous cell lung cancer -Second course of radiation treatment was for the left upper lobe nodule-based on increase in size and increased density -Follow-up CT scan scheduled for January 2020 -Continues to follow-up with oncology  Reformed smoker  History of hypertension Lumbar spondylosis    Plan/Recommendations:  Continue nebulization treatments with DuoNeb Albuterol MDI as needed We will give her a course of doxycycline 100 p.o. twice daily for 10 days Prednisone 10 p.o. twice daily for 5 days  We will obtain a pulmonary function study We will follow-up on repeat CT scan scheduled for January 2020  I will see her back in the office in 3 months She is encouraged to call with any significant concerns     Sherrilyn Rist MD [facility] 07/05/2018, 9:13 AM  CC: Esaw Grandchild, NP

## 2018-07-05 NOTE — Patient Instructions (Signed)
History of COPD Right upper lobe lung nodule treated with radiation treatments Left upper lobe nodule-status post radiation treatments  We will follow-up with your CT scan scheduled for this January  I will give you a course of doxycycline 100 p.o. twice daily for 10 days Prednisone 10 p.o. twice daily for 5 days  We will get a breathing study when you feel more comfortable and you are not coughing as much  I will see you back in the office in about 3 months Call with significant concerns  Continue to use your breathing treatments as you are at present

## 2018-07-12 ENCOUNTER — Ambulatory Visit (HOSPITAL_COMMUNITY)
Admission: RE | Admit: 2018-07-12 | Discharge: 2018-07-12 | Disposition: A | Discharge status: Home / Self Care | Payer: PPO | Source: Ambulatory Visit | Attending: Hematology | Admitting: Hematology

## 2018-07-12 DIAGNOSIS — C3490 Malignant neoplasm of unspecified part of unspecified bronchus or lung: Secondary | ICD-10-CM | POA: Diagnosis not present

## 2018-07-12 DIAGNOSIS — C3411 Malignant neoplasm of upper lobe, right bronchus or lung: Secondary | ICD-10-CM | POA: Diagnosis not present

## 2018-07-12 DIAGNOSIS — C3412 Malignant neoplasm of upper lobe, left bronchus or lung: Secondary | ICD-10-CM | POA: Diagnosis not present

## 2018-07-15 ENCOUNTER — Telehealth: Payer: Self-pay | Admitting: Adult Health

## 2018-07-15 NOTE — Telephone Encounter (Signed)
Patient called just wanted to let provider know she saw the [facility] specialist and that is treatment is working well , she is feeling much better.  -- Forwarding message as an Pharmacist, hospital.  --glh

## 2018-07-18 NOTE — Progress Notes (Signed)
HEMATOLOGY/ONCOLOGY CLINIC NOTE  Date of Service: 07/19/2018  Patient Care Team: Esaw Grandchild, NP as PCP - General (Family Medicine)  CHIEF COMPLAINTS/PURPOSE OF CONSULTATION:   F/u for recurent squamous cell lung cancer   HISTORY OF PRESENTING ILLNESS:    Chelsea Baker is a wonderful 73 y.o. female who has been referred to [place] by Dr .Sloan Leiter, Henreitta Leber, MD  for evaluation and management of newly diagnosed Squmous cell carcinoma of the RUL.  Patient has a history of hypertension, nicotine dependence, GERD, hiatal hernia, AAA and presented with a 2-week history of cough, weight loss about 20 pounds in about 6 months and increasing shortness of breath over the last several days prior to admission.  In the ED the patient had a chest x-ray which showed a 3.4 cm Which showed a 3.4 cm right upper lobe mass which appeared cavitary.  Subsequent CT of the chest showed a 3.5 cm cavitary mass involving the right upper lobe concerning for a primary bronchogenic carcinoma.  She was also noted to have a 4 mm nodule which is indeterminate in the right lower lobe.  No evidence of metastatic disease noted in the chest or upper abdomen. No evidence of pulmonary embolism.  Patient subsequently had a CT-guided biopsy of the right upper lobe lung lesion on 07/15/2017.  Pathology revealed squamous cell carcinoma of the lung. She had other workup including an AFB smear which was negative.  Patient notes that she does not really have a primary care physician and has not had good medical follow-up and does not really see a doctor regularly. She has had a history of smoking 1/2-2 packs/day for 50 years.  No diagnosed COPD though likely has significant decline in lung function at baseline. Currently was short of breath despite lung cancer not involving a large part of the lung at this time and no evidence of PE.  Still needing 2-3 L of oxygen by nasal cannula currently and endorses shortness of  breath with minimal walking.  She notes that she lives with her son.   INTERVAL HISTORY   Chelsea Baker is here regarding her lung cancer. The patient's last visit with [place] was on 03/29/18. The pt reports that she is doing well overall.   In the interim, the pt pursued SBRT wit my colleague Dr. Lisbeth Renshaw who treated the pt with 54 Gy over 3 fractions between 04/06/18 and 04/13/18.  The pt reports that she has not developed any new concerns in the interim. She notes that she had to run a couple days ago to chase her dogs for about 10 minutes without feeling exceptionally out of breath. She also denies SOB at this time and hasn't needed to use oxygen since last spring 2019.   The pt uses albuterol as needed, and a nebulizer once in a while. She also established care with Dr. Ander Slade in pulmonology in the interim and was treated with Doxycycline and Prednisone with successful relief of her phlegm production and cough. She presently denies any concerns for infection.   The pt endorses good energy levels and and has had stable weight.   Of note since the patient's last visit, pt has had a CT Chest completed on 07/12/18 with results revealing 2.2 x 1.2 cm irregular/spiculated posterior right upper lobe nodule, grossly unchanged. 5 x 10 mm left upper lobe nodule, decreased. Aortic Atherosclerosis and Emphysema.  Lab results today (07/19/18) of CBC w/diff and CMP is as follows: all values are WNL  except for HGB at 11.6, Albumin at 3.3.  On review of systems, pt reports breathing well, good energy levels, eating well, stable weight, and denies SOB, concerns for infection, cough, abdominal pains, leg swelling, and any other symptoms.   MEDICAL HISTORY:  Past Medical History:  Diagnosis Date  . AAA (abdominal aortic aneurysm) ([place])   . Diverticulosis   . GERD (gastroesophageal reflux disease)   . Hiatal hernia   . Hypertension   . Hypokalemia   . Lumbar spondylolysis   . Lung cancer ([place])   . Lung  tumor   . On home O2    2L N/C    SURGICAL HISTORY: Past Surgical History:  Procedure Laterality Date  . ABDOMINAL AORTIC ANEURYSM REPAIR  10/01/10   endostent repair  . CATARACT EXTRACTION, BILATERAL    . LUNG BIOPSY    . NM MYOVIEW LTD  2012   Normal Lexiscan Myoview with no ischemia or infarction.  Normal LV function.  EF 67%.  . TONSILLECTOMY    . TRANSTHORACIC ECHOCARDIOGRAM  07/17/2017   EF 35-40%.  Septal mid and basal inferior wall hypokinesis.  Mildly dilated LV.  Mildly reduced EF.  GR 1 DD.  Calcified mitral annulus.     SOCIAL HISTORY: Social History   Socioeconomic History  . Marital status: Single    Spouse name: Not on file  . Number of children: Not on file  . Years of education: Not on file  . Highest education level: Not on file  Occupational History  . Not on file  Social Needs  . Financial resource strain: Not on file  . Food insecurity:    Worry: Not on file    Inability: Not on file  . Transportation needs:    Medical: Not on file    Non-medical: Not on file  Tobacco Use  . Smoking status: Former Smoker    Packs/day: 1.00    Years: 50.00    Pack years: 50.00    Types: Cigarettes    Last attempt to quit: 06/2017    Years since quitting: 1.0  . Smokeless tobacco: Never Used  Substance and Sexual Activity  . Alcohol use: Yes    Comment: rarely  . Drug use: No  . Sexual activity: Not Currently  Lifestyle  . Physical activity:    Days per week: Not on file    Minutes per session: Not on file  . Stress: Not on file  Relationships  . Social connections:    Talks on phone: Not on file    Gets together: Not on file    Attends religious service: Not on file    Active member of club or organization: Not on file    Attends meetings of clubs or organizations: Not on file    Relationship status: Not on file  . Intimate partner violence:    Fear of current or ex partner: No    Emotionally abused: No    Physically abused: No    Forced sexual  activity: No  Other Topics Concern  . Not on file  Social History Narrative  . Not on file    FAMILY HISTORY: Family History  Problem Relation Age of Onset  . Heart failure Mother   . Heart attack Mother 106       In 56s - 1st MI  . Coronary artery disease Mother   . Coronary artery disease Father        Died at age 78  . Heart  attack Father 34  . Heart disease Maternal Grandmother   . Heart disease Maternal Grandfather   . Cancer Neg Hx     ALLERGIES:  has No Known Allergies.  MEDICATIONS:  Current Outpatient Medications  Medication Sig Dispense Refill  . albuterol (PROVENTIL HFA;VENTOLIN HFA) 108 (90 Base) MCG/ACT inhaler Inhale 2 puffs into the lungs every 4 (four) hours as needed for wheezing or shortness of breath. 1 Inhaler 3  . ibuprofen (ADVIL,MOTRIN) 200 MG tablet Take 400 mg by mouth every 6 (six) hours as needed.    Marland Kitchen ipratropium-albuterol (DUONEB) 0.5-2.5 (3) MG/3ML SOLN Take 3 mLs by nebulization every 4 (four) hours as needed (sob). 3 mL 6  . metoprolol succinate (TOPROL-XL) 50 MG 24 hr tablet Take 1 tablet (50 mg total) by mouth daily. Take with or immediately following a meal. 90 tablet 3   No current facility-administered medications for this visit.     REVIEW OF SYSTEMS:    A 10+ POINT REVIEW OF SYSTEMS WAS OBTAINED including neurology, dermatology, psychiatry, cardiac, respiratory, lymph, extremities, GI, GU, Musculoskeletal, constitutional, breasts, reproductive, HEENT.  All pertinent positives are noted in the HPI.  All others are negative.   PHYSICAL EXAMINATION: ECOG PERFORMANCE STATUS: 3 - Symptomatic, >50% confined to bed  Vitals:   07/19/18 1231  BP: (!) 165/123  Pulse: (!) 104  Resp: 18  Temp: 97.7 F (36.5 C)  SpO2: 96%   Filed Weights   07/19/18 1231  Weight: 118 lb 3.2 oz (53.6 kg)   .Body mass index is 22.33 kg/m. NAD  GENERAL:alert, in no acute distress and comfortable SKIN: no acute rashes, no significant lesions EYES:  conjunctiva are pink and non-injected, sclera anicteric OROPHARYNX: MMM, no exudates, no oropharyngeal erythema or ulceration NECK: supple, no JVD LYMPH:  no palpable lymphadenopathy in the cervical, axillary or inguinal regions LUNGS: clear to auscultation b/l with normal respiratory effort HEART: regular rate & rhythm ABDOMEN:  normoactive bowel sounds , non tender, not distended. No palpable hepatosplenomegaly.  Extremity: no pedal edema PSYCH: alert & oriented x 3 with fluent speech NEURO: no focal motor/sensory deficits   LABORATORY DATA:  I have reviewed the data as listed  . CBC Latest Ref Rng & Units 07/19/2018 03/08/2018 12/07/2017  WBC 4.0 - 10.5 K/uL 7.2 4.9 5.8  Hemoglobin 12.0 - 15.0 g/dL 11.6(L) 12.8 12.1  Hematocrit 36.0 - 46.0 % 38.2 39.8 39.2  Platelets 150 - 400 K/uL 329 298 274    . CMP Latest Ref Rng & Units 07/19/2018 03/08/2018 12/07/2017  Glucose 70 - 99 mg/dL 95 134(H) 113  BUN 8 - 23 mg/dL 9 13 16   Creatinine 0.44 - 1.00 mg/dL 0.90 1.04(H) 0.87  Sodium 135 - 145 mmol/L 136 137 137  Potassium 3.5 - 5.1 mmol/L 4.3 4.3 3.9  Chloride 98 - 111 mmol/L 100 103 104  CO2 22 - 32 mmol/L 26 25 26   Calcium 8.9 - 10.3 mg/dL 9.7 9.4 9.7  Total Protein 6.5 - 8.1 g/dL 7.3 7.4 6.9  Total Bilirubin 0.3 - 1.2 mg/dL 0.3 0.2(L) <0.2(L)  Alkaline Phos 38 - 126 U/L 102 109 104  AST 15 - 41 U/L 17 28 25   ALT 0 - 44 U/L 11 16 23     PATHOLOGY       RADIOGRAPHIC STUDIES: I have personally reviewed the radiological images as listed and agreed with the findings in the report. Ct Chest Wo Contrast  Result Date: 07/12/2018 CLINICAL DATA:  Bilateral upper lobe lung cancer,  status post radiation therapy EXAM: CT CHEST WITHOUT CONTRAST TECHNIQUE: Multidetector CT imaging of the chest was performed following the standard protocol without IV contrast. COMPARISON:  PET-CT dated 03/18/2018.  CT chest dated 03/01/2018. FINDINGS: Cardiovascular: Heart is normal in size.  No pericardial  effusion. No evidence of thoracic aortic aneurysm. Atherosclerotic calcifications of the aortic arch. Coronary atherosclerosis of the LAD and right coronary artery. Mediastinum/Nodes: No suspicious mediastinal lymphadenopathy. Visualized thyroid is unremarkable. Lungs/Pleura: 2.2 x 1.2 cm irregular/spiculated posterior right upper lobe nodule (series 5/image 45), previously 2.0 x 1.1 cm on prior PET and 2.3 x 1.5 cm on prior CT, overall grossly unchanged. 5 x 10 mm left upper lobe nodule (series 5/image 76), previously 12 x 15 mm on PET and 12 x 16 mm on CT, decreased. Additional 4 mm nodule in the superior segment right lower lobe (series 5/image 61), unchanged. Mild centrilobular and paraseptal emphysematous changes, upper lobe predominant. No focal consolidation. No pleural effusion or pneumothorax. Upper Abdomen: Visualized upper abdomen is notable for vascular calcifications and an aortic stent, incompletely visualized. Musculoskeletal: Degenerative changes of the visualized thoracolumbar spine. IMPRESSION: 2.2 x 1.2 cm irregular/spiculated posterior right upper lobe nodule, grossly unchanged. 5 x 10 mm left upper lobe nodule, decreased. Aortic Atherosclerosis (ICD10-I70.0) and Emphysema (ICD10-J43.9). Electronically Signed   By: Julian Hy M.D.   On: 07/12/2018 19:28    ASSESSMENT & PLAN:   Chelsea Baker is a 73 y.o. female with   1) h/o right upper lobe squamous cell carcinoma of the lung. Stage I Presenting with a cavitary RUL mass.  Biopsy proven to be squamous cell carcinoma. No overt mediastinal or hilar lymphadenopathy noted. PET/CT showed no overt metastatic disease CT head neg for mets. Patient declined MRI S/p SBRT 60 Gy in 5 fractions 09/01/17 to 09/10/17  CT Chest 10/22/17 shows improvement in the right upper lobe mass after [facility]  03/01/18 CT Chest revealed Continued reduction in spiculated posterior right upper lobe pulmonary nodule. 2. Left upper lobe 1.6 cm spiculated  pulmonary nodule is increased in size and density, compatible with malignancy, either a second primary bronchogenic carcinoma or contralateral metastasis. 3. No thoracic adenopathy.  03/18/18 PET/CT revealed Intense metabolic activity associated with enlarging LEFT upper lobe pulmonary nodule is most suggestive primary bronchogenic Carcinoma. 2. Mild to moderate activity associated treated pulmonary nodule in the RIGHT upper lobe with associated moderate metabolic activity in adjacent chest wall favored post radiation change inflammation. 3. Stable additional RIGHT lower lobe pulmonary nodule. 4. No new pulmonary nodules evident. 5. No evidence of metastatic mediastinal adenopathy or distant disease.  2) Now with Left upper lobe 1.6 cm spiculated pulmonary nodule- increasing in size. Seen on the 03/18/18 PET/CT as noted above. Newly identified Left enlarging nodule is suspected to be a new primary, and would be considered a new Stage I tumor S/p 54 Gy in 3 fractions 04/06/18 to 04/13/18  PLAN: -Discussed pt labwork today, 07/19/18; blood counts and chemistries are stable  -Discussed the 07/12/18 CT Chest which revealed 2.2 x 1.2 cm irregular/spiculated posterior right upper lobe nodule, grossly unchanged. 5 x 10 mm left upper lobe nodule, decreased. Aortic Atherosclerosis and Emphysema. -Discussed that the CT imaging reveals stability and some improvement of her two lung nodules . No overt new actionable lung findings. Will continue to need to monitor these closely. -Will repeat CT in 6 months prior to clinic visit. -f/u radiation oncology as per their recommendations.  -Recommended that the pt followup with PCP to  receive yearly flu shot and q5 yr Prevnar and pneumovax  -Will see the pt back in 6 months  3) Dyspnea and rest and on minimal exertion. - much improved CT chest with no PE  significant COPD at baseline -  Needing 2-3 L of oxygen by nasal cannula in hospital. and is now off oxygen ECHO ef  35-40% -Pulmonary function testing -severe obstruction - optimize rx -noted to have systolic cardiomyopathy - mx Dr Ellyn Hack. -Continue follow up with Dr. Sherrilyn Rist in Pulmonology   4) Poor medical f/u. Past Medical History:  Diagnosis Date  . AAA (abdominal aortic aneurysm) ([place])   . Diverticulosis   . GERD (gastroesophageal reflux disease)   . Hiatal hernia   . Hypertension   . Hypokalemia   . Lumbar spondylolysis   . Lung cancer ([place])   . Lung tumor   . On home O2    2L N/C   4) Protein calorie malnutrition -nutritional consultation  5) Heavy smoker 75-100 pk-yrs -Pt reported on 08/12/17 that she has completely quit smoking and plans to never smoke again.  -Pt has continued smoking cessation as of 03/08/18 and was encouraged to keep up her good work.   CT chest in 24 weeks RTC with Dr [name] in 6 months with labs   All of the patients questions were answered with apparent satisfaction. The patient knows to call the clinic with any problems, questions or concerns.  The total time spent in the appt was 25 minutes and more than 50% was on counseling and direct patient cares.    Sullivan Lone MD [place] AAHIVMS [facility] Hematology/Oncology Physician [facility]  (Office):       [phone] (Work cell):  [phone] (Fax):           [phone]  I, Baldwin [place], am acting as a scribe for Dr. Sullivan Lone.   .I have reviewed the above documentation for accuracy and completeness, and I agree with the above. [name] MD

## 2018-07-18 NOTE — Telephone Encounter (Signed)
FYI only.

## 2018-07-19 ENCOUNTER — Inpatient Hospital Stay (HOSPITAL_BASED_OUTPATIENT_CLINIC_OR_DEPARTMENT_OTHER): Payer: PPO | Admitting: Hematology

## 2018-07-19 ENCOUNTER — Telehealth: Payer: Self-pay | Admitting: Hematology

## 2018-07-19 ENCOUNTER — Inpatient Hospital Stay: Payer: PPO | Attending: Hematology

## 2018-07-19 VITALS — BP 165/123 | HR 104 | Temp 97.7°F | Resp 18 | Ht 61.0 in | Wt 118.2 lb

## 2018-07-19 DIAGNOSIS — C3411 Malignant neoplasm of upper lobe, right bronchus or lung: Secondary | ICD-10-CM

## 2018-07-19 DIAGNOSIS — R911 Solitary pulmonary nodule: Secondary | ICD-10-CM

## 2018-07-19 DIAGNOSIS — C3490 Malignant neoplasm of unspecified part of unspecified bronchus or lung: Secondary | ICD-10-CM

## 2018-07-19 DIAGNOSIS — J449 Chronic obstructive pulmonary disease, unspecified: Secondary | ICD-10-CM

## 2018-07-19 LAB — CBC WITH DIFFERENTIAL/PLATELET
Abs Immature Granulocytes: 0.02 10*3/uL (ref 0.00–0.07)
BASOS PCT: 1 %
Basophils Absolute: 0.1 10*3/uL (ref 0.0–0.1)
Eosinophils Absolute: 0.1 10*3/uL (ref 0.0–0.5)
Eosinophils Relative: 1 %
HCT: 38.2 % (ref 36.0–46.0)
Hemoglobin: 11.6 g/dL — ABNORMAL LOW (ref 12.0–15.0)
Immature Granulocytes: 0 %
Lymphocytes Relative: 15 %
Lymphs Abs: 1.1 10*3/uL (ref 0.7–4.0)
MCH: 27 pg (ref 26.0–34.0)
MCHC: 30.4 g/dL (ref 30.0–36.0)
MCV: 88.8 fL (ref 80.0–100.0)
Monocytes Absolute: 0.6 10*3/uL (ref 0.1–1.0)
Monocytes Relative: 9 %
Neutro Abs: 5.4 10*3/uL (ref 1.7–7.7)
Neutrophils Relative %: 74 %
Platelets: 329 10*3/uL (ref 150–400)
RBC: 4.3 MIL/uL (ref 3.87–5.11)
RDW: 14.3 % (ref 11.5–15.5)
WBC: 7.2 10*3/uL (ref 4.0–10.5)
nRBC: 0 % (ref 0.0–0.2)

## 2018-07-19 LAB — CMP (CANCER CENTER ONLY)
ALT: 11 U/L (ref 0–44)
AST: 17 U/L (ref 15–41)
Albumin: 3.3 g/dL — ABNORMAL LOW (ref 3.5–5.0)
Alkaline Phosphatase: 102 U/L (ref 38–126)
Anion gap: 10 (ref 5–15)
BUN: 9 mg/dL (ref 8–23)
CO2: 26 mmol/L (ref 22–32)
CREATININE: 0.9 mg/dL (ref 0.44–1.00)
Calcium: 9.7 mg/dL (ref 8.9–10.3)
Chloride: 100 mmol/L (ref 98–111)
GFR, Est AFR Am: >60 mL/min (ref >60)
GFR, Estimated: >60 mL/min (ref >60)
Glucose, Bld: 95 mg/dL (ref 70–99)
Potassium: 4.3 mmol/L (ref 3.5–5.1)
SODIUM: 136 mmol/L (ref 135–145)
Total Bilirubin: 0.3 mg/dL (ref 0.3–1.2)
Total Protein: 7.3 g/dL (ref 6.5–8.1)

## 2018-07-19 NOTE — Patient Instructions (Signed)
Thank you for choosing [facility] to provide your oncology and hematology care.  To afford each patient quality time with our providers, please arrive 30 minutes before your scheduled appointment time.  If you arrive late for your appointment, you may be asked to reschedule.  We strive to give you quality time with our providers, and arriving late affects you and other patients whose appointments are after yours.    If you are a no show for multiple scheduled visits, you may be dismissed from the clinic at the providers discretion.     Again, thank you for choosing [facility], our hope is that these requests will decrease the amount of time that you wait before being seen by our physicians.  ______________________________________________________________________   Should you have questions after your visit to the [facility], please contact our office at [phone] between the hours of 8:30 and 4:30 p.m.    Voicemails left after 4:30p.m will not be returned until the following business day.     For prescription refill requests, please have your pharmacy contact us directly.  Please also try to allow 48 hours for prescription requests.     Please contact the scheduling department for questions regarding scheduling.  For scheduling of procedures such as PET scans, CT scans, MRI, Ultrasound, etc please contact central scheduling at [phone].     Resources For Cancer Patients and Caregivers:    Oncolink.org:  A wonderful resource for patients and healthcare providers for information regarding your disease, ways to tract your treatment, what to expect, etc.      American Cancer Society:  800-227-2345  Can help patients locate various types of support and financial assistance   Cancer Care: 1-800-813-HOPE (4673) Provides financial assistance, online support groups, medication/co-pay assistance.     [place] DSS:  336-641-3447 Where to apply  for food stamps, Medicaid, and utility assistance   Medicare Rights Center: 800-333-4114 Helps people with Medicare understand their rights and benefits, navigate the Medicare system, and secure the quality healthcare they deserve   SCAT: 336-333-6589 [place] [employer]'s shared-ride transportation service for eligible riders who have a disability that prevents them from riding the fixed route bus.     For additional information on assistance programs please contact our social worker:   Abigail Elmore:  [phone]  

## 2018-07-19 NOTE — Telephone Encounter (Signed)
Scheduled appt per 1/21 los - gave patient AVS and calender per los.

## 2018-07-26 ENCOUNTER — Ambulatory Visit (INDEPENDENT_AMBULATORY_CARE_PROVIDER_SITE_OTHER): Payer: PPO | Admitting: Pulmonary Disease

## 2018-07-26 DIAGNOSIS — J441 Chronic obstructive pulmonary disease with (acute) exacerbation: Secondary | ICD-10-CM

## 2018-07-26 LAB — PULMONARY FUNCTION TEST
DL/VA % pred: 74 %
DL/VA: 3.29 ml/min/mmHg/L
DLCO unc % pred: 52 %
DLCO unc: 10.64 ml/min/mmHg
FEF 25-75 Post: 0.77 L/sec
FEF 25-75 Pre: 0.49 L/sec
FEF2575-%Change-Post: 58 %
FEF2575-%PRED-POST: 47 %
FEF2575-%Pred-Pre: 29 %
FEV1-%Change-Post: 12 %
FEV1-%Pred-Post: 58 %
FEV1-%Pred-Pre: 51 %
FEV1-Post: 1.12 L
FEV1-Pre: 1 L
FEV1FVC-%Change-Post: 4 %
FEV1FVC-%Pred-Pre: 85 %
FEV6-%Change-Post: 9 %
FEV6-%PRED-POST: 68 %
FEV6-%Pred-Pre: 62 %
FEV6-Post: 1.66 L
FEV6-Pre: 1.52 L
FEV6FVC-%CHANGE-POST: 1 %
FEV6FVC-%Pred-Post: 105 %
FEV6FVC-%Pred-Pre: 103 %
FVC-%Change-Post: 7 %
FVC-%Pred-Post: 64 %
FVC-%Pred-Pre: 60 %
FVC-Post: 1.66 L
FVC-Pre: 1.55 L
POST FEV1/FVC RATIO: 67 %
Post FEV6/FVC ratio: 100 %
Pre FEV1/FVC ratio: 64 %
Pre FEV6/FVC Ratio: 98 %
RV % pred: 130 %
RV: 2.72 L
TLC % pred: 92 %
TLC: 4.28 L

## 2018-07-26 NOTE — Progress Notes (Signed)
PFT done today. 

## 2018-10-24 ENCOUNTER — Ambulatory Visit: Payer: Self-pay | Admitting: Pulmonary Disease

## 2018-11-21 ENCOUNTER — Other Ambulatory Visit: Payer: Self-pay | Admitting: Cardiology

## 2018-12-01 ENCOUNTER — Telehealth: Payer: Self-pay | Admitting: Adult Health

## 2018-12-01 ENCOUNTER — Other Ambulatory Visit: Payer: Self-pay

## 2018-12-01 ENCOUNTER — Other Ambulatory Visit (INDEPENDENT_AMBULATORY_CARE_PROVIDER_SITE_OTHER): Payer: PPO

## 2018-12-01 DIAGNOSIS — I159 Secondary hypertension, unspecified: Secondary | ICD-10-CM | POA: Diagnosis not present

## 2018-12-01 DIAGNOSIS — E785 Hyperlipidemia, unspecified: Secondary | ICD-10-CM

## 2018-12-01 DIAGNOSIS — Z0000 Encounter for general adult medical examination without abnormal findings: Secondary | ICD-10-CM | POA: Diagnosis not present

## 2018-12-01 NOTE — Telephone Encounter (Signed)
Good Evening Chelsea Baker, I reviewed all my notes on this pt and I have never Rx'd eye gtt's for her. I would need at min WebEx to rx to her. Please call her Monday and have her schedule Thanks! Valetta Fuller

## 2018-12-01 NOTE — Telephone Encounter (Signed)
Patient came into the office for labs and upon check out requested I send a refill request for antibiotic eye drops that she is almost out of (patient could not rmember the exact name of the drops she used). I advised patient I did not see anything on her med list for anything like that but would still forward a refill auth to PCP for review. Patient is coming back into our office for a f/u with PCP on 12/19/18

## 2018-12-01 NOTE — Telephone Encounter (Signed)
Please advise if patient needs OV for stye on the eye. MPulliam, CMA/RT(R)

## 2018-12-02 LAB — CBC WITH DIFFERENTIAL/PLATELET
Basophils Absolute: 0 10*3/uL (ref 0.0–0.2)
Basos: 1 %
EOS (ABSOLUTE): 0.3 10*3/uL (ref 0.0–0.4)
Eos: 5 %
Hematocrit: 37.6 % (ref 34.0–46.6)
Hemoglobin: 12.6 g/dL (ref 11.1–15.9)
Immature Grans (Abs): 0 10*3/uL (ref 0.0–0.1)
Immature Granulocytes: 0 %
Lymphocytes Absolute: 0.9 10*3/uL (ref 0.7–3.1)
Lymphs: 14 %
MCH: 26.8 pg (ref 26.6–33.0)
MCHC: 33.5 g/dL (ref 31.5–35.7)
MCV: 80 fL (ref 79–97)
Monocytes Absolute: 0.6 10*3/uL (ref 0.1–0.9)
Monocytes: 9 %
Neutrophils Absolute: 4.6 10*3/uL (ref 1.4–7.0)
Neutrophils: 71 %
Platelets: 331 10*3/uL (ref 150–450)
RBC: 4.7 x10E6/uL (ref 3.77–5.28)
RDW: 13.5 % (ref 11.7–15.4)
WBC: 6.5 10*3/uL (ref 3.4–10.8)

## 2018-12-02 LAB — COMPREHENSIVE METABOLIC PANEL
ALT: 13 IU/L (ref 0–32)
AST: 22 IU/L (ref 0–40)
Albumin/Globulin Ratio: 1.6 (ref 1.2–2.2)
Albumin: 4.4 g/dL (ref 3.7–4.7)
Alkaline Phosphatase: 94 IU/L (ref 39–117)
BUN/Creatinine Ratio: 16 (ref 12–28)
BUN: 16 mg/dL (ref 8–27)
Bilirubin Total: 0.3 mg/dL (ref 0.0–1.2)
CO2: 25 mmol/L (ref 20–29)
Calcium: 9.8 mg/dL (ref 8.7–10.3)
Chloride: 95 mmol/L — ABNORMAL LOW (ref 96–106)
Creatinine, Ser: 1.03 mg/dL — ABNORMAL HIGH (ref 0.57–1.00)
GFR calc Af Amer: 62 mL/min/{1.73_m2} (ref >59)
GFR calc non Af Amer: 54 mL/min/{1.73_m2} — ABNORMAL LOW (ref >59)
Globulin, Total: 2.7 g/dL (ref 1.5–4.5)
Glucose: 99 mg/dL (ref 65–99)
Potassium: 4.8 mmol/L (ref 3.5–5.2)
Sodium: 135 mmol/L (ref 134–144)
Total Protein: 7.1 g/dL (ref 6.0–8.5)

## 2018-12-02 LAB — LIPID PANEL
Chol/HDL Ratio: 5.1 ratio — ABNORMAL HIGH (ref 0.0–4.4)
Cholesterol, Total: 249 mg/dL — ABNORMAL HIGH (ref 100–199)
HDL: 49 mg/dL (ref >39)
LDL Calculated: 149 mg/dL — ABNORMAL HIGH (ref 0–99)
Triglycerides: 253 mg/dL — ABNORMAL HIGH (ref 0–149)
VLDL Cholesterol Cal: 51 mg/dL — ABNORMAL HIGH (ref 5–40)

## 2018-12-02 LAB — TSH: TSH: 1.7 u[IU]/mL (ref 0.450–4.500)

## 2018-12-02 LAB — HEMOGLOBIN A1C
Est. average glucose Bld gHb Est-mCnc: 123 mg/dL
Hgb A1c MFr Bld: 5.9 % — ABNORMAL HIGH (ref 4.8–5.6)

## 2018-12-05 NOTE — Telephone Encounter (Signed)
Called to notify the patient she states that she can wait until appointment with [facility] on 12/19/2018. MPulliam, CMA/RT(R)

## 2018-12-08 ENCOUNTER — Other Ambulatory Visit: Payer: Self-pay | Admitting: Adult Health

## 2018-12-12 ENCOUNTER — Encounter: Payer: Self-pay | Admitting: Adult Health

## 2018-12-19 ENCOUNTER — Encounter: Payer: Self-pay | Admitting: Adult Health

## 2018-12-19 ENCOUNTER — Other Ambulatory Visit: Payer: Self-pay

## 2018-12-19 ENCOUNTER — Telehealth: Payer: Self-pay | Admitting: Pulmonary Disease

## 2018-12-19 ENCOUNTER — Ambulatory Visit (INDEPENDENT_AMBULATORY_CARE_PROVIDER_SITE_OTHER): Payer: PPO | Admitting: Adult Health

## 2018-12-19 VITALS — BP 164/84 | HR 104 | Temp 98.3°F | Ht 61.0 in | Wt 130.8 lb

## 2018-12-19 DIAGNOSIS — E785 Hyperlipidemia, unspecified: Secondary | ICD-10-CM | POA: Diagnosis not present

## 2018-12-19 DIAGNOSIS — E7800 Pure hypercholesterolemia, unspecified: Secondary | ICD-10-CM | POA: Diagnosis not present

## 2018-12-19 DIAGNOSIS — I159 Secondary hypertension, unspecified: Secondary | ICD-10-CM | POA: Diagnosis not present

## 2018-12-19 DIAGNOSIS — Z0000 Encounter for general adult medical examination without abnormal findings: Secondary | ICD-10-CM | POA: Diagnosis not present

## 2018-12-19 MED ORDER — IPRATROPIUM-ALBUTEROL 0.5-2.5 (3) MG/3ML IN SOLN: 3.0000 mL | RESPIRATORY_TRACT | 0 refills | Status: DC | PRN

## 2018-12-19 MED ORDER — PREDNISONE 10 MG PO TABS: 20.0000 mg | ORAL_TABLET | Freq: Every day | ORAL | 0 refills | Status: AC

## 2018-12-19 NOTE — Addendum Note (Signed)
Addended by: Valerie Salts on: 12/19/2018 04:37 PM   Modules accepted: Orders

## 2018-12-19 NOTE — Assessment & Plan Note (Addendum)
BP and HR above goal 164/84, HR 104 She has not taken Metoprolol Succinate 50mg  in >48 hrs Please take your medications EVERY day.  Your blood pressure and heart rate were both above goal today. Also recommend obtain home BP machine that takes reading from upper arm- check several times/week.

## 2018-12-19 NOTE — Progress Notes (Signed)
Subjective:    Patient ID: Chelsea Baker, female    DOB: 17-Nov-1945, 73 y.o.   MRN: [id number]  HPI:   Chelsea Baker is here for 6 month f/u: HLD, HTN, Chronic Dyspnea, AAA (without rupture). She has several acute complaints- 1) Increase in dyspnea.  Per pt, she has been unable to schedule OV with her Pulmonologist due to "they are all working in the hospital". We will reach out to Dr. Judson Roch office. She is able to walk her dogs 15-20 mins/day. She reports warm/humid weather worsens her SOB. She has remained tobacco free- GREAT 2) Bil hearing loss r/t "cloggeg ears".  She reports intermittent clear/yellow nasal drainage, denies increase in cough. She denies sore throat/fever/N/V/D. She has been intermittently using OTC Benadryl during the day- advised her to use non-sedating OTC antihistamines. Increase fluids. 3) Elevated BP/HR, she does not check her BP/HR at home. She reports very inconsistent use of BB, maybe 3 times/week. Discussed the importance of keeping her BP and HR well controlled and taking Toprol XL 50mg  DAILY. She has not seen Cards/Dr. Ellyn Hack since 07/2017 4) Reviewed recent labs- per pt, she was not fasting at time of specimen collection 12/01/2018: TSH-WH, 1.700  CBC-WNL  CMP-stable, GFR 54, LFTs- WNL  A1c-5.9, bump from 5.6 last year- needs to decrease CHO/sugar  The 10-year ASCVD risk score Mikey Bussing DC Brooke Bonito., et al., 2013) is: 28.1%  Values used to calculate the score:   Age: 70 years   Sex: Female   Is Non-Hispanic African American: No   Diabetic: No   Tobacco smoker: No   Systolic Blood Pressure: 976 mmHg   Is BP treated: Yes   HDL Cholesterol: 49 mg/dL   Total Cholesterol: 249 mg/dL  LDL-149  Discussed diet modifications and will recheck lipid panel at f/u in 4 months- she is aware to come fasting  Last Oncology OV- 07/19/2018 -Discussed pt labwork today, 07/19/18; blood counts and chemistries are stable  -Discussed the 07/12/18  CT Chest which revealed 2.2 x 1.2 cm irregular/spiculated posterior right upper lobe nodule, grossly unchanged. 5 x 10 mm left upper lobe nodule, decreased. Aortic Atherosclerosis and Emphysema. -Discussed that the CT imaging reveals stability and some improvement of her two lung nodules . No overt new actionable lung findings. Will continue to need to monitor these closely. -Will repeat CT in 6 months prior to clinic visit. -f/u radiation oncology as per their recommendations.  -Recommended that the pt followup with PCP to receive yearly flu shot and q5 yr Prevnar and pneumovax  -Will see the pt back in 6 months  3) Dyspnea and rest and on minimal exertion. - much improved CT chest with no PE  significant COPD at baseline -  Needing 2-3 L of oxygen by nasal cannula in hospital.and is now off oxygen ECHO ef 35-40% -Pulmonary function testing -severe obstruction - optimize rx -noted to have systolic cardiomyopathy - mx Dr Ellyn Hack. -Continue follow up with Dr. Sherrilyn Rist in Pulmonology   Last Pulmonology OV- 07/05/2018 Continue nebulization treatments with DuoNeb Albuterol MDI as needed We will give her a course of doxycycline 100 p.o. twice daily for 10 days Prednisone 10 p.o. twice daily for 5 days  We will obtain a pulmonary function study We will follow-up on repeat CT scan scheduled for January 2020  I will see her back in the office in 3 months She is encouraged to call with any significant concerns   Patient Care Team    Relationship Specialty Notifications  Start End  Esaw Grandchild, NP PCP - General Family Medicine  08/25/17     Patient Active Problem List   Diagnosis Date Noted  . Malignant neoplasm of upper lobe of left lung ([place]) 03/22/2018  . Healthcare maintenance 12/21/2017  . Hyperlipidemia 12/21/2017  . Hypertension 08/25/2017  . Chest discomfort 08/10/2017  . Abnormal echocardiogram 08/10/2017  . Hypoxia   . Dilated cardiomyopathy ([place]) 07/17/2017  .  Shortness of breath   . Protein-calorie malnutrition ([place])   . Malignant neoplasm of upper lobe of right lung ([place]) 07/12/2017  . Tachycardia 07/12/2017  . Hyponatremia 07/12/2017  . Cavitating mass in right upper lung lobe 07/12/2017  . AAA (abdominal aortic aneurysm) without rupture ([place]) 04/10/2014  . Aftercare following surgery of the circulatory system 04/10/2014  . Aftercare following surgery of the circulatory system, [place] 11/15/2012     Past Medical History:  Diagnosis Date  . AAA (abdominal aortic aneurysm) ([place])   . Diverticulosis   . GERD (gastroesophageal reflux disease)   . Hiatal hernia   . Hypertension   . Hypokalemia   . Lumbar spondylolysis   . Lung cancer ([place])   . Lung tumor   . On home O2    2L N/C     Past Surgical History:  Procedure Laterality Date  . ABDOMINAL AORTIC ANEURYSM REPAIR  10/01/10   endostent repair  . CATARACT EXTRACTION, BILATERAL    . LUNG BIOPSY    . NM MYOVIEW LTD  2012   Normal Lexiscan Myoview with no ischemia or infarction.  Normal LV function.  EF 67%.  . TONSILLECTOMY    . TRANSTHORACIC ECHOCARDIOGRAM  07/17/2017   EF 35-40%.  Septal mid and basal inferior wall hypokinesis.  Mildly dilated LV.  Mildly reduced EF.  GR 1 DD.  Calcified mitral annulus.      Family History  Problem Relation Age of Onset  . Heart failure Mother   . Heart attack Mother 55       In 65s - 1st MI  . Coronary artery disease Mother   . Coronary artery disease Father        Died at age 55  . Heart attack Father 90  . Heart disease Maternal Grandmother   . Heart disease Maternal Grandfather   . Cancer Neg Hx      Social History   Substance and Sexual Activity  Drug Use No     Social History   Substance and Sexual Activity  Alcohol Use Yes   Comment: rarely     Social History   Tobacco Use  Smoking Status Former Smoker  . Packs/day: 1.00  . Years: 50.00  . Pack years: 50.00  . Types: Cigarettes  . Quit date: 06/2017  . Years  since quitting: 1.4  Smokeless Tobacco Never Used     Outpatient Encounter Medications as of 12/19/2018  Medication Sig  . acetaminophen (TYLENOL) 500 MG tablet Take 500 mg by mouth every 6 (six) hours as needed.  Marland Kitchen albuterol (PROVENTIL HFA;VENTOLIN HFA) 108 (90 Base) MCG/ACT inhaler Inhale 2 puffs into the lungs every 4 (four) hours as needed for wheezing or shortness of breath.  . metoprolol succinate (TOPROL-XL) 50 MG 24 hr tablet TAKE 1 TABLET (50 MG TOTAL) BY MOUTH DAILY. TAKE WITH OR IMMEDIATELY FOLLOWING A MEAL.  . [DISCONTINUED] ipratropium-albuterol (DUONEB) 0.5-2.5 (3) MG/3ML SOLN TAKE 3 MLS BY NEBULIZATION EVERY 4 HOURS AS NEEDED (SOB).  . [DISCONTINUED] ibuprofen (ADVIL,MOTRIN) 200 MG tablet Take 400  mg by mouth every 6 (six) hours as needed.   No facility-administered encounter medications on file as of 12/19/2018.     Allergies: Patient has no known allergies.  Body mass index is 24.71 kg/m.  Blood pressure (!) 164/84, pulse (!) 104, temperature 98.3 F (36.8 C), temperature source Oral, height 5\' 1"  (1.549 m), weight 130 lb 12.8 oz (59.3 kg), SpO2 95 %.     Review of Systems  Constitutional: Positive for fatigue. Negative for activity change, appetite change, chills, diaphoresis, fever and unexpected weight change.  HENT: Positive for congestion, hearing loss and postnasal drip. Negative for ear discharge, ear pain, sinus pressure, sinus pain, sore throat, trouble swallowing and voice change.   Eyes: Negative for visual disturbance.  Respiratory: Positive for cough, shortness of breath and wheezing. Negative for chest tightness and stridor.   Cardiovascular: Negative for chest pain, palpitations and leg swelling.  Gastrointestinal: Negative for abdominal pain, blood in stool, constipation, diarrhea and vomiting.  Endocrine: Negative for cold intolerance, heat intolerance, polydipsia, polyphagia and polyuria.  Genitourinary: Negative for difficulty urinating and flank  pain.  Musculoskeletal: Negative for arthralgias, back pain, gait problem, joint swelling, myalgias, neck pain and neck stiffness.  Neurological: Negative for dizziness and headaches.  Hematological: Negative for adenopathy. Does not bruise/bleed easily.  Psychiatric/Behavioral: Negative for agitation, behavioral problems, confusion, decreased concentration, dysphoric mood, hallucinations, self-injury, sleep disturbance and suicidal ideas. The patient is not nervous/anxious and is not hyperactive.        Objective:   Physical Exam Constitutional:      General: She is not in acute distress.    Appearance: Normal appearance. She is normal weight. She is not ill-appearing, toxic-appearing or diaphoretic.  HENT:     Head: Normocephalic and atraumatic.     Right Ear: Decreased hearing noted. No drainage, swelling or tenderness. There is impacted cerumen. Tympanic membrane is bulging. Tympanic membrane is not erythematous.     Left Ear: Decreased hearing noted. No drainage, swelling or tenderness. There is impacted cerumen. Tympanic membrane is bulging. Tympanic membrane is not erythematous.     Nose: Rhinorrhea present.     Right Turbinates: Swollen.     Left Turbinates: Swollen.     Right Sinus: No maxillary sinus tenderness or frontal sinus tenderness.     Left Sinus: No maxillary sinus tenderness or frontal sinus tenderness.     Mouth/Throat:     Pharynx: Posterior oropharyngeal erythema present. No oropharyngeal exudate.  Eyes:     Extraocular Movements: Extraocular movements intact.     Conjunctiva/sclera: Conjunctivae normal.     Pupils: Pupils are equal, round, and reactive to light.  Neck:     Musculoskeletal: Normal range of motion. No muscular tenderness.  Cardiovascular:     Rate and Rhythm: Tachycardia present.     Pulses: Normal pulses.     Heart sounds: Normal heart sounds. No murmur. No friction rub. No gallop.   Pulmonary:     Breath sounds: Examination of the  right-upper field reveals wheezing. Examination of the left-upper field reveals wheezing. Examination of the right-middle field reveals wheezing. Examination of the left-middle field reveals wheezing. Wheezing present. No decreased breath sounds, rhonchi or rales.  Skin:    General: Skin is warm and dry.     Capillary Refill: Capillary refill takes less than 2 seconds.  Neurological:     Mental Status: She is alert and oriented to person, place, and time.  Psychiatric:        Mood  and Affect: Mood normal.        Behavior: Behavior normal.        Thought Content: Thought content normal.        Judgment: Judgment normal.       Assessment & Plan:   1. Elevated LDL cholesterol level   2. Healthcare maintenance   3. Secondary hypertension   4. Hyperlipidemia, unspecified hyperlipidemia type     Healthcare maintenance 1) Follow Mediterranean diet. Reduce saturated fat/sugar/carbohydrates in diet. 2) Continue to walk daily as tolerated. 3) Follow-up with Pulmonology, someone from that office will call you today schedule an appt. 4) Recommend OTC Allegra, once daily for the next week.  If your eyes do not feel better in one week, call clinic and I will send in an antibiotic. 5) Continue follow-up with Oncology. 6) Please schedule follow-up here in four months, make a morning appt and come fasting- will recheck cholesterol panel. 7) Please take your medications EVERY day.  Your blood pressure and heart rate were both above goal today. 8) Social distance and wear a mask when in public.  Hypertension BP and HR above goal 164/84, HR 104 She has not taken Metoprolol Succinate 50mg  in >48 hrs Please take your medications EVERY day.  Your blood pressure and heart rate were both above goal today. Also recommend obtain home BP machine that takes reading from upper arm- check several times/week.   Hyperlipidemia The 10-year ASCVD risk score Mikey Bussing DC Jr., et al., 2013) is: 27.8%   Values used  to calculate the score:     Age: 22 years     Sex: Female     Is Non-Hispanic African American: No     Diabetic: No     Tobacco smoker: No     Systolic Blood Pressure: 536 mmHg     Is BP treated: Yes     HDL Cholesterol: 49 mg/dL     Total Cholesterol: 249 mg/dL  LDL-149 She was non-fasting at time of specimen collection Discussed diet modifications Will recheck in 4 months- come fasting    FOLLOW-UP:  Return in about 4 months (around 04/20/2019) for HTN, Hypercholestermia, Fasting Labs.

## 2018-12-19 NOTE — Assessment & Plan Note (Signed)
1) Follow Mediterranean diet. Reduce saturated fat/sugar/carbohydrates in diet. 2) Continue to walk daily as tolerated. 3) Follow-up with Pulmonology, someone from that office will call you today schedule an appt. 4) Recommend OTC Allegra, once daily for the next week.  If your eyes do not feel better in one week, call clinic and I will send in an antibiotic. 5) Continue follow-up with Oncology. 6) Please schedule follow-up here in four months, make a morning appt and come fasting- will recheck cholesterol panel. 7) Please take your medications EVERY day.  Your blood pressure and heart rate were both above goal today. 8) Social distance and wear a mask when in public.

## 2018-12-19 NOTE — Telephone Encounter (Addendum)
Spoke with patient. She is aware that Chelsea Baker has called in the prednisone. She stated that she will call back if she is not feeling better after the prednisone. She also asked for a refill on her DuoNeb. Advised her that I would send in a refill.   Nothing further needed at time of call.

## 2018-12-19 NOTE — Telephone Encounter (Signed)
Primary Pulmonologist: AO Last office visit and with whom: 07/05/2018 with AO What do we see them for (pulmonary problems): COPD Last OV assessment/plan: Assessment:  Chronic obstructive pulmonary disease -Severity is unclear at present as she is not had a PFT in the past, an attempt in the past was not successful as she was having a lot of symptoms at the time -She continues on albuterol MDI and DuoNeb as needed -Appears to be exacerbated at the present time with increasing cough, some shortness of breath, increased phlegm production  COPD exacerbation  History of lung cancer -Has had 2 courses of radiation treatments-initially to right upper lobe lesion with a diagnosis of squamous cell lung cancer -Second course of radiation treatment was for the left upper lobe nodule-based on increase in size and increased density -Follow-up CT scan scheduled for January 2020 -Continues to follow-up with oncology  Reformed smoker  History of hypertension Lumbar spondylosis    Plan/Recommendations:  Continue nebulization treatments with DuoNeb Albuterol MDI as needed We will give her a course of doxycycline 100 p.o. twice daily for 10 days Prednisone 10 p.o. twice daily for 5 days  We will obtain a pulmonary function study We will follow-up on repeat CT scan scheduled for January 2020  I will see her back in the office in 3 months She is encouraged to call with any significant concerns  Was appointment offered to patient (explain)?  Pt wants recommendations   Reason for call: Called and spoke with pt who has complaints of SOB which she states is worse than usual. Pt states her breathing has been worse since the weather became warmer but could not tell me exactly how long it has been worse. Pt states she has also been coughing more often and is coughing up clear mucus.  Pt has had to use nebulizer at least three times daily and has also had to use her rescue inhaler at least twice  a day.  Pt denies any complaints of fever and states her temp today at PCP was 98.3. pt states her O2 sats have been good as she was satting at 95% and pt is not on any O2.  Pt is wanting recommendations to help with her symptoms. Tonya, please advise on this for pt. Thanks!

## 2018-12-19 NOTE — Assessment & Plan Note (Signed)
The 10-year ASCVD risk score Mikey Bussing DC Brooke Bonito., et al., 2013) is: 27.8%   Values used to calculate the score:     Age: 73 years     Sex: Female     Is Non-Hispanic African American: No     Diabetic: No     Tobacco smoker: No     Systolic Blood Pressure: 381 mmHg     Is BP treated: Yes     HDL Cholesterol: 49 mg/dL     Total Cholesterol: 249 mg/dL  LDL-149 She was non-fasting at time of specimen collection Discussed diet modifications Will recheck in 4 months- come fasting

## 2018-12-19 NOTE — Patient Instructions (Signed)
Mediterranean Diet A Mediterranean diet refers to food and lifestyle choices that are based on the traditions of countries located on the [employer]. This way of eating has been shown to help prevent certain conditions and improve outcomes for people who have chronic diseases, like kidney disease and heart disease. What are tips for following this plan? Lifestyle  Cook and eat meals together with your family, when possible.  Drink enough fluid to keep your urine clear or pale yellow.  Be physically active every day. This includes: ? Aerobic exercise like running or swimming. ? Leisure activities like gardening, walking, or housework.  Get 7-8 hours of sleep each night.  If recommended by your health care provider, drink red wine in moderation. This means 1 glass a day for nonpregnant women and 2 glasses a day for men. A glass of wine equals 5 oz (150 mL). Reading food labels   Check the serving size of packaged foods. For foods such as rice and pasta, the serving size refers to the amount of cooked product, not dry.  Check the total fat in packaged foods. Avoid foods that have saturated fat or trans fats.  Check the ingredients list for added sugars, such as corn syrup. Shopping  At the grocery store, buy most of your food from the areas near the walls of the store. This includes: ? Fresh fruits and vegetables (produce). ? Grains, beans, nuts, and seeds. Some of these may be available in unpackaged forms or large amounts (in bulk). ? Fresh seafood. ? Poultry and eggs. ? Low-fat dairy products.  Buy whole ingredients instead of prepackaged foods.  Buy fresh fruits and vegetables in-season from local farmers markets.  Buy frozen fruits and vegetables in resealable bags.  If you do not have access to quality fresh seafood, buy precooked frozen shrimp or canned fish, such as tuna, salmon, or sardines.  Buy small amounts of raw or cooked vegetables, salads, or olives from  the deli or salad bar at your store.  Stock your pantry so you always have certain foods on hand, such as olive oil, canned tuna, canned tomatoes, rice, pasta, and beans. Cooking  Cook foods with extra-virgin olive oil instead of using butter or other vegetable oils.  Have meat as a side dish, and have vegetables or grains as your main dish. This means having meat in small portions or adding small amounts of meat to foods like pasta or stew.  Use beans or vegetables instead of meat in common dishes like chili or lasagna.  Experiment with different cooking methods. Try roasting or broiling vegetables instead of steaming or sauteing them.  Add frozen vegetables to soups, stews, pasta, or rice.  Add nuts or seeds for added healthy fat at each meal. You can add these to yogurt, salads, or vegetable dishes.  Marinate fish or vegetables using olive oil, lemon juice, garlic, and fresh herbs. Meal planning   Plan to eat 1 vegetarian meal one day each week. Try to work up to 2 vegetarian meals, if possible.  Eat seafood 2 or more times a week.  Have healthy snacks readily available, such as: ? Vegetable sticks with hummus. ? [place] yogurt. ? Fruit and nut trail mix.  Eat balanced meals throughout the week. This includes: ? Fruit: 2-3 servings a day ? Vegetables: 4-5 servings a day ? Low-fat dairy: 2 servings a day ? Fish, poultry, or lean meat: 1 serving a day ? Beans and legumes: 2 or more servings a week ?  Nuts and seeds: 1-2 servings a day ? Whole grains: 6-8 servings a day ? Extra-virgin olive oil: 3-4 servings a day  Limit red meat and sweets to only a few servings a month What are my food choices?  Mediterranean diet ? Recommended ? Grains: Whole-grain pasta. Brown rice. Bulgar wheat. Polenta. Couscous. Whole-wheat bread. [place]. ? Vegetables: Artichokes. Beets. Broccoli. Cabbage. Carrots. Eggplant. Green beans. Chard. Kale. Spinach. Onions. Leeks. Peas. Squash.  Tomatoes. Peppers. Radishes. ? Fruits: Apples. Apricots. Avocado. Berries. Bananas. Cherries. Dates. Figs. Grapes. Lemons. Melon. Oranges. Peaches. Plums. Pomegranate. ? Meats and other protein foods: Beans. Almonds. Sunflower seeds. Pine nuts. Peanuts. [place]. Salmon. Scallops. Shrimp. [place]. Tilapia. Clams. Oysters. Eggs. ? Dairy: Low-fat milk. Cheese. Greek yogurt. ? Beverages: Water. Red wine. Herbal tea. ? Fats and oils: Extra virgin olive oil. Avocado oil. Grape seed oil. ? Sweets and desserts: [place] yogurt with honey. Baked apples. Poached pears. Trail mix. ? Seasoning and other foods: Basil. Cilantro. Coriander. Cumin. Mint. Parsley. Sage. Rosemary. Tarragon. Garlic. Oregano. Thyme. Pepper. Balsalmic vinegar. Tahini. Hummus. Tomato sauce. Olives. Mushrooms. ? Limit these ? Grains: Prepackaged pasta or rice dishes. Prepackaged cereal with added sugar. ? Vegetables: Deep fried potatoes (french fries). ? Fruits: Fruit canned in syrup. ? Meats and other protein foods: Beef. Pork. Lamb. Poultry with skin. Hot dogs. [place] Salines. ? Dairy: Ice cream. Sour cream. Whole milk. ? Beverages: Juice. Sugar-sweetened soft drinks. Beer. Liquor and spirits. ? Fats and oils: Butter. Canola oil. Vegetable oil. Beef fat (tallow). Lard. ? Sweets and desserts: Cookies. Cakes. Pies. Candy. ? Seasoning and other foods: Mayonnaise. Premade sauces and marinades. ? The items listed may not be a complete list. Talk with your dietitian about what dietary choices are right for you. Summary  The Mediterranean diet includes both food and lifestyle choices.  Eat a variety of fresh fruits and vegetables, beans, nuts, seeds, and whole grains.  Limit the amount of red meat and sweets that you eat.  Talk with your health care provider about whether it is safe for you to drink red wine in moderation. This means 1 glass a day for nonpregnant women and 2 glasses a day for men. A glass of wine equals 5 oz (150 mL). This information  is not intended to replace advice given to you by your health care provider. Make sure you discuss any questions you have with your health care provider. Document Released: 02/06/2016 Document Revised: 03/10/2016 Document Reviewed: 02/06/2016 Elsevier Interactive Patient Education  2019 [employer].   1) Follow Mediterranean diet. Reduce saturated fat/sugar/carbohydrates in diet. 2) Continue to walk daily as tolerated. 3) Follow-up with Pulmonology, someone from that office will call you today schedule an appt. 4) Recommend OTC Allegra, once daily for the next week.  If your eyes do not feel better in one week, call clinic and I will send in an antibiotic. 5) Continue follow-up with Oncology. 6) Please schedule follow-up here in four months, make a morning appt and come fasting- will recheck cholesterol panel. 7) Please take your medications EVERY day.  Your blood pressure and heart rate were both above goal today. 8) Social distance and wear a mask when in public. STAY SAFE!

## 2018-12-19 NOTE — Telephone Encounter (Signed)
Good to hear that her temperature and oxygen level has been stable. I have ordered prednisone. If this does not help she will need an e-visit. Thanks.

## 2019-01-10 ENCOUNTER — Encounter (HOSPITAL_COMMUNITY): Payer: Self-pay

## 2019-01-10 ENCOUNTER — Other Ambulatory Visit: Payer: Self-pay

## 2019-01-10 ENCOUNTER — Ambulatory Visit (HOSPITAL_COMMUNITY)
Admission: RE | Admit: 2019-01-10 | Discharge: 2019-01-10 | Disposition: A | Discharge status: Home / Self Care | Payer: PPO | Source: Ambulatory Visit | Attending: Hematology | Admitting: Hematology

## 2019-01-10 DIAGNOSIS — C3490 Malignant neoplasm of unspecified part of unspecified bronchus or lung: Secondary | ICD-10-CM | POA: Diagnosis not present

## 2019-01-10 DIAGNOSIS — R911 Solitary pulmonary nodule: Secondary | ICD-10-CM | POA: Insufficient documentation

## 2019-01-12 ENCOUNTER — Encounter: Payer: Self-pay | Admitting: Pulmonary Disease

## 2019-01-12 ENCOUNTER — Other Ambulatory Visit: Payer: Self-pay

## 2019-01-12 ENCOUNTER — Ambulatory Visit (INDEPENDENT_AMBULATORY_CARE_PROVIDER_SITE_OTHER): Payer: PPO | Admitting: Pulmonary Disease

## 2019-01-12 VITALS — BP 138/82 | HR 91 | Temp 98.4°F | Ht 61.5 in | Wt 126.4 lb

## 2019-01-12 DIAGNOSIS — R0602 Shortness of breath: Secondary | ICD-10-CM | POA: Diagnosis not present

## 2019-01-12 MED ORDER — BUDESONIDE 0.5 MG/2ML IN SUSP: 0.5000 mg | Freq: Two times a day (BID) | RESPIRATORY_TRACT | 5 refills | Status: AC

## 2019-01-12 NOTE — Progress Notes (Signed)
Chelsea Baker    [id number]    08-04-1945  Primary Care Physician:Danford, Berna Spare, NP  Referring Physician: Esaw Grandchild, NP [place],  [place] [id number]  Chief complaint:   Shortness of breath with activity No chest pains or chest discomfort  HPI:  History of chronic obstructive pulmonary disease Diagnosis squamous cell lung cancer in 2019 Did receive radiation treatments Has had radiation treatments to multiple nodules  Being followed up radiologically  No chest pain or discomfort No increased cough No increased secretions Appetite is stable Weight is stable  Uses albuterol and a nebulizer Feels the nebulizer helps a lot more/a lot better than using the MDI She uses albuterol, has a nebulizer that she uses as needed  Pets: 3 dogs Occupation: Works in Scientist, research (medical) Exposures: Smoking history: Quit in 2019  Outpatient Encounter Medications as of 01/12/2019  Medication Sig  . acetaminophen (TYLENOL) 500 MG tablet Take 500 mg by mouth every 6 (six) hours as needed.  Marland Kitchen albuterol (PROVENTIL HFA;VENTOLIN HFA) 108 (90 Base) MCG/ACT inhaler Inhale 2 puffs into the lungs every 4 (four) hours as needed for wheezing or shortness of breath.  Marland Kitchen ipratropium-albuterol (DUONEB) 0.5-2.5 (3) MG/3ML SOLN Take 3 mLs by nebulization every 4 (four) hours as needed.  . metoprolol succinate (TOPROL-XL) 50 MG 24 hr tablet TAKE 1 TABLET (50 MG TOTAL) BY MOUTH DAILY. TAKE WITH OR IMMEDIATELY FOLLOWING A MEAL.   No facility-administered encounter medications on file as of 01/12/2019.     Allergies as of 01/12/2019  . (No Known Allergies)    Past Medical History:  Diagnosis Date  . AAA (abdominal aortic aneurysm) ([place])   . Diverticulosis   . GERD (gastroesophageal reflux disease)   . Hiatal hernia   . Hypertension   . Hypokalemia   . Lumbar spondylolysis   . Lung cancer ([place]) dx'd 2019  . Lung tumor   . On home O2    2L N/C    Past Surgical History:   Procedure Laterality Date  . ABDOMINAL AORTIC ANEURYSM REPAIR  10/01/10   endostent repair  . CATARACT EXTRACTION, BILATERAL    . LUNG BIOPSY    . NM MYOVIEW LTD  2012   Normal Lexiscan Myoview with no ischemia or infarction.  Normal LV function.  EF 67%.  . TONSILLECTOMY    . TRANSTHORACIC ECHOCARDIOGRAM  07/17/2017   EF 35-40%.  Septal mid and basal inferior wall hypokinesis.  Mildly dilated LV.  Mildly reduced EF.  GR 1 DD.  Calcified mitral annulus.     Family History  Problem Relation Age of Onset  . Heart failure Mother   . Heart attack Mother 51       In 77s - 1st MI  . Coronary artery disease Mother   . Coronary artery disease Father        Died at age 48  . Heart attack Father 95  . Heart disease Maternal Grandmother   . Heart disease Maternal Grandfather   . Cancer Neg Hx     Social History   Socioeconomic History  . Marital status: Single    Spouse name: Not on file  . Number of children: Not on file  . Years of education: Not on file  . Highest education level: Not on file  Occupational History  . Not on file  Social Needs  . Financial resource strain: Not on file  . Food insecurity  Worry: Not on file    Inability: Not on file  . Transportation needs    Medical: Not on file    Non-medical: Not on file  Tobacco Use  . Smoking status: Former Smoker    Packs/day: 1.00    Years: 50.00    Pack years: 50.00    Types: Cigarettes    Quit date: 06/2017    Years since quitting: 1.5  . Smokeless tobacco: Never Used  Substance and Sexual Activity  . Alcohol use: Yes    Comment: rarely  . Drug use: No  . Sexual activity: Not Currently  Lifestyle  . Physical activity    Days per week: Not on file    Minutes per session: Not on file  . Stress: Not on file  Relationships  . Social Herbalist on phone: Not on file    Gets together: Not on file    Attends religious service: Not on file    Active member of club or organization: Not on file     Attends meetings of clubs or organizations: Not on file    Relationship status: Not on file  . Intimate partner violence    Fear of current or ex partner: No    Emotionally abused: No    Physically abused: No    Forced sexual activity: No  Other Topics Concern  . Not on file  Social History Narrative  . Not on file    Review of Systems  Constitutional: Negative for fatigue.  HENT: Negative.   Eyes: Negative.   Respiratory: Positive for cough and shortness of breath. Negative for apnea.   Cardiovascular: Negative.   Gastrointestinal: Negative.   Endocrine: Negative.   Genitourinary: Negative.   Musculoskeletal: Negative.     Vitals:   01/12/19 1340  BP: 138/82  Pulse: 91  Temp: 98.4 F (36.9 C)  SpO2: 98%     Physical Exam  Constitutional: She is oriented to person, place, and time. She appears well-developed and well-nourished.  HENT:  Head: Normocephalic and atraumatic.  Neck: Normal range of motion. Neck supple. No tracheal deviation present. No thyromegaly present.  Cardiovascular: Normal rate and regular rhythm.  Pulmonary/Chest: Effort normal and breath sounds normal. No respiratory distress. She has no wheezes.  Abdominal: Soft. Bowel sounds are normal. She exhibits no distension. There is no abdominal tenderness.  Musculoskeletal: Normal range of motion.        General: No edema.  Neurological: She is alert and oriented to person, place, and time. No cranial nerve deficit.  Skin: Skin is warm and dry.   Data Reviewed: CT scan April 2019, September 2019, PET scan October 2019 reviewed with the patient-films reviewed CT scan of the chest performed July 2020-stable findings, new lesion left lung may be related to radiation changes-needs radiological follow-up  Assessment:  Chronic obstructive pulmonary disease -PFT did indicate moderately severe obstruction -Continues to use albuterol MDI and DuoNeb as needed -Drives a whole lot better benefit from using  the nebulizer -Shortness of breath with activity appears to be stable -Less active recently with the restrictions   -Plan is to add Pulmicort based of off significant bronchodilator response on PFT  History of lung cancer Abnormal CT scan of the chest -Plan is to follow-up CT scan findings -Findings of new nodule may be related to radiation changes -Needs close radiological follow-up  .  She continues to follow with oncology as well -Encouraged to continue to stay very active  Reformed smoker  History of hypertension Lumbar spondylosis    Plan/Recommendations:  Continue nebulization treatments with DuoNeb Add Pulmicort Continue albuterol MDI as needed  Radiological follow-up of new left nodule/scarring  See her back in the office in about 6 months Encouraged to call with any significant concerns   Sherrilyn Rist MD [facility] 01/12/2019, 1:53 PM  CC: Esaw Grandchild, NP

## 2019-01-12 NOTE — Patient Instructions (Signed)
Chronic obstructive pulmonary disease Cancer, status post radiation treatments  We will add another medication to your nebulizer It is a steroid and will help keep in your breathing tubes open  We will see you back in the office in 6 months Call with significant concerns

## 2019-01-17 ENCOUNTER — Telehealth: Payer: Self-pay | Admitting: Hematology

## 2019-01-17 ENCOUNTER — Inpatient Hospital Stay: Payer: PPO | Attending: Hematology | Admitting: Hematology

## 2019-01-17 ENCOUNTER — Other Ambulatory Visit: Payer: Self-pay

## 2019-01-17 ENCOUNTER — Inpatient Hospital Stay: Payer: PPO

## 2019-01-17 VITALS — BP 142/86 | HR 83 | Temp 98.9°F | Resp 18 | Ht 61.5 in | Wt 127.4 lb

## 2019-01-17 DIAGNOSIS — J449 Chronic obstructive pulmonary disease, unspecified: Secondary | ICD-10-CM | POA: Insufficient documentation

## 2019-01-17 DIAGNOSIS — C3411 Malignant neoplasm of upper lobe, right bronchus or lung: Secondary | ICD-10-CM | POA: Diagnosis not present

## 2019-01-17 DIAGNOSIS — E46 Unspecified protein-calorie malnutrition: Secondary | ICD-10-CM | POA: Diagnosis not present

## 2019-01-17 DIAGNOSIS — R0600 Dyspnea, unspecified: Secondary | ICD-10-CM | POA: Diagnosis not present

## 2019-01-17 DIAGNOSIS — C3490 Malignant neoplasm of unspecified part of unspecified bronchus or lung: Secondary | ICD-10-CM

## 2019-01-17 DIAGNOSIS — R911 Solitary pulmonary nodule: Secondary | ICD-10-CM | POA: Diagnosis not present

## 2019-01-17 LAB — CMP (CANCER CENTER ONLY)
ALT: 16 U/L (ref 0–44)
AST: 24 U/L (ref 15–41)
Albumin: 4 g/dL (ref 3.5–5.0)
Alkaline Phosphatase: 80 U/L (ref 38–126)
Anion gap: 10 (ref 5–15)
BUN: 13 mg/dL (ref 8–23)
CO2: 25 mmol/L (ref 22–32)
Calcium: 9.7 mg/dL (ref 8.9–10.3)
Chloride: 102 mmol/L (ref 98–111)
Creatinine: 1.07 mg/dL — ABNORMAL HIGH (ref 0.44–1.00)
GFR, Est AFR Am: 60 mL/min — ABNORMAL LOW (ref >60)
GFR, Estimated: 51 mL/min — ABNORMAL LOW (ref >60)
Glucose, Bld: 101 mg/dL — ABNORMAL HIGH (ref 70–99)
Potassium: 5 mmol/L (ref 3.5–5.1)
Sodium: 137 mmol/L (ref 135–145)
Total Bilirubin: 0.3 mg/dL (ref 0.3–1.2)
Total Protein: 7.3 g/dL (ref 6.5–8.1)

## 2019-01-17 LAB — CBC WITH DIFFERENTIAL/PLATELET
Abs Immature Granulocytes: 0.01 10*3/uL (ref 0.00–0.07)
Basophils Absolute: 0 10*3/uL (ref 0.0–0.1)
Basophils Relative: 1 %
Eosinophils Absolute: 0.2 10*3/uL (ref 0.0–0.5)
Eosinophils Relative: 3 %
HCT: 41.7 % (ref 36.0–46.0)
Hemoglobin: 12.6 g/dL (ref 12.0–15.0)
Immature Granulocytes: 0 %
Lymphocytes Relative: 20 %
Lymphs Abs: 1.2 10*3/uL (ref 0.7–4.0)
MCH: 26.3 pg (ref 26.0–34.0)
MCHC: 30.2 g/dL (ref 30.0–36.0)
MCV: 86.9 fL (ref 80.0–100.0)
Monocytes Absolute: 0.5 10*3/uL (ref 0.1–1.0)
Monocytes Relative: 8 %
Neutro Abs: 4.1 10*3/uL (ref 1.7–7.7)
Neutrophils Relative %: 68 %
Platelets: 291 10*3/uL (ref 150–400)
RBC: 4.8 MIL/uL (ref 3.87–5.11)
RDW: 14.7 % (ref 11.5–15.5)
WBC: 6.1 10*3/uL (ref 4.0–10.5)
nRBC: 0 % (ref 0.0–0.2)

## 2019-01-17 NOTE — Telephone Encounter (Signed)
Scheduled appt per 7/21 los. ° °Printed and mailed appt calendar. °

## 2019-01-17 NOTE — Progress Notes (Signed)
HEMATOLOGY/ONCOLOGY CLINIC NOTE  Date of Service: 01/17/2019  Patient Care Team: Esaw Grandchild, NP as PCP - General (Family Medicine)  CHIEF COMPLAINTS/PURPOSE OF CONSULTATION:   F/u for recurent squamous cell lung cancer   HISTORY OF PRESENTING ILLNESS:    Chelsea Baker is a wonderful 73 y.o. female who has been referred to [place] by Dr .Sloan Leiter, Henreitta Leber, MD  for evaluation and management of newly diagnosed Squmous cell carcinoma of the RUL.  Patient has a history of hypertension, nicotine dependence, GERD, hiatal hernia, AAA and presented with a 2-week history of cough, weight loss about 20 pounds in about 6 months and increasing shortness of breath over the last several days prior to admission.  In the ED the patient had a chest x-ray which showed a 3.4 cm Which showed a 3.4 cm right upper lobe mass which appeared cavitary.  Subsequent CT of the chest showed a 3.5 cm cavitary mass involving the right upper lobe concerning for a primary bronchogenic carcinoma.  She was also noted to have a 4 mm nodule which is indeterminate in the right lower lobe.  No evidence of metastatic disease noted in the chest or upper abdomen. No evidence of pulmonary embolism.  Patient subsequently had a CT-guided biopsy of the right upper lobe lung lesion on 07/15/2017.  Pathology revealed squamous cell carcinoma of the lung. She had other workup including an AFB smear which was negative.  Patient notes that she does not really have a primary care physician and has not had good medical follow-up and does not really see a doctor regularly. She has had a history of smoking 1/2-2 packs/day for 50 years.  No diagnosed COPD though likely has significant decline in lung function at baseline. Currently was short of breath despite lung cancer not involving a large part of the lung at this time and no evidence of PE.  Still needing 2-3 L of oxygen by nasal cannula currently and endorses shortness of  breath with minimal walking.  She notes that she lives with her son.   INTERVAL HISTORY   Chelsea Baker is here regarding her lung cancer. The patient's last visit with [place] was on 07/19/2018. The pt reports that she is doing well overall.  The pt reports that she continues to go out and eat 2 to 3 times per week despite concerns for the pandemic.   Of note since the patient's last visit, pt has had a chest CT without contrast completed on 01/10/2019 with results revealing Decrease in size of lingular nodule with a arc like band of radiation change within the lingula. Minimal nodularity remains. New lingular nodule more inferiorly is not clearly seen on prior and may represent atelectasis. Recommend attention on follow-up. Decreased size of peripheral nodule in the RIGHT upper lobe along the pleural surface also with evidence of radiation change surrounding the nodule. No mediastinal adenopathy.  Lab results today (01/17/19) of CBC w/diff and CMP is as follows: all values are WNL except for glucose at 101, creatinine at 1.07, GFR non af at 51, and GFR AFR at 60.  On review of systems, pt denies fevers, chills, night sweats, weight loss, abdominal pain, or leg swelling, and any other symptoms.    MEDICAL HISTORY:  Past Medical History:  Diagnosis Date   AAA (abdominal aortic aneurysm) (HCC)    Diverticulosis    GERD (gastroesophageal reflux disease)    Hiatal hernia    Hypertension    Hypokalemia  Lumbar spondylolysis    Lung cancer ([place]) dx'd 2019   Lung tumor    On home O2    2L N/C    SURGICAL HISTORY: Past Surgical History:  Procedure Laterality Date   ABDOMINAL AORTIC ANEURYSM REPAIR  10/01/10   endostent repair   CATARACT EXTRACTION, BILATERAL     LUNG BIOPSY     NM MYOVIEW LTD  2012   Normal Lexiscan Myoview with no ischemia or infarction.  Normal LV function.  EF 67%.   TONSILLECTOMY     TRANSTHORACIC ECHOCARDIOGRAM  07/17/2017   EF 35-40%.   Septal mid and basal inferior wall hypokinesis.  Mildly dilated LV.  Mildly reduced EF.  GR 1 DD.  Calcified mitral annulus.     SOCIAL HISTORY: Social History   Socioeconomic History   Marital status: Single    Spouse name: Not on file   Number of children: Not on file   Years of education: Not on file   Highest education level: Not on file  Occupational History   Not on file  Social Needs   Financial resource strain: Not on file   Food insecurity    Worry: Not on file    Inability: Not on file   Transportation needs    Medical: Not on file    Non-medical: Not on file  Tobacco Use   Smoking status: Former Smoker    Packs/day: 1.00    Years: 50.00    Pack years: 50.00    Types: Cigarettes    Quit date: 06/2017    Years since quitting: 1.5   Smokeless tobacco: Never Used  Substance and Sexual Activity   Alcohol use: Yes    Comment: rarely   Drug use: No   Sexual activity: Not Currently  Lifestyle   Physical activity    Days per week: Not on file    Minutes per session: Not on file   Stress: Not on file  Relationships   Social connections    Talks on phone: Not on file    Gets together: Not on file    Attends religious service: Not on file    Active member of club or organization: Not on file    Attends meetings of clubs or organizations: Not on file    Relationship status: Not on file   Intimate partner violence    Fear of current or ex partner: No    Emotionally abused: No    Physically abused: No    Forced sexual activity: No  Other Topics Concern   Not on file  Social History Narrative   Not on file    FAMILY HISTORY: Family History  Problem Relation Age of Onset   Heart failure Mother    Heart attack Mother 16       In 55s - 1st MI   Coronary artery disease Mother    Coronary artery disease Father        Died at age 46   Heart attack Father 53   Heart disease Maternal Grandmother    Heart disease Maternal Grandfather     Cancer Neg Hx     ALLERGIES:  has No Known Allergies.  MEDICATIONS:  Current Outpatient Medications  Medication Sig Dispense Refill   acetaminophen (TYLENOL) 500 MG tablet Take 500 mg by mouth every 6 (six) hours as needed.     albuterol (PROVENTIL HFA;VENTOLIN HFA) 108 (90 Base) MCG/ACT inhaler Inhale 2 puffs into the lungs every 4 (four) hours as  needed for wheezing or shortness of breath. 1 Inhaler 3   budesonide (PULMICORT) 0.5 MG/2ML nebulizer solution Take 2 mLs (0.5 mg total) by nebulization 2 (two) times daily. 120 mL 5   ipratropium-albuterol (DUONEB) 0.5-2.5 (3) MG/3ML SOLN Take 3 mLs by nebulization every 4 (four) hours as needed. 90 mL 0   metoprolol succinate (TOPROL-XL) 50 MG 24 hr tablet TAKE 1 TABLET (50 MG TOTAL) BY MOUTH DAILY. TAKE WITH OR IMMEDIATELY FOLLOWING A MEAL. 90 tablet 1   No current facility-administered medications for this visit.     REVIEW OF SYSTEMS:   A 10+ POINT REVIEW OF SYSTEMS WAS OBTAINED including neurology, dermatology, psychiatry, cardiac, respiratory, lymph, extremities, GI, GU, Musculoskeletal, constitutional, breasts, reproductive, HEENT.  All pertinent positives are noted in the HPI.  All others are negative.     PHYSICAL EXAMINATION: ECOG PERFORMANCE STATUS: 3 - Symptomatic, >50% confined to bed  Vitals:   01/17/19 1326  BP: (!) 142/86  Pulse: 83  Resp: 18  Temp: 98.9 F (37.2 C)  SpO2: 95%   Filed Weights   01/17/19 1326  Weight: 127 lb 6.4 oz (57.8 kg)   .Body mass index is 23.68 kg/m. NAD  GENERAL:alert, in no acute distress and comfortable SKIN: no acute rashes, no significant lesions EYES: conjunctiva are pink and non-injected, sclera anicteric OROPHARYNX: MMM, no exudates, no oropharyngeal erythema or ulceration NECK: supple, no JVD LYMPH:  no palpable lymphadenopathy in the cervical, axillary or inguinal regions LUNGS: clear to auscultation b/l with normal respiratory effort HEART: regular rate &  rhythm ABDOMEN:  normoactive bowel sounds , non tender, not distended. No palpable hepatosplenomegaly.  Extremity: no pedal edema PSYCH: alert & oriented x 3 with fluent speech NEURO: no focal motor/sensory deficits    LABORATORY DATA:  I have reviewed the data as listed  . CBC Latest Ref Rng & Units 01/17/2019 12/01/2018 07/19/2018  WBC 4.0 - 10.5 K/uL 6.1 6.5 7.2  Hemoglobin 12.0 - 15.0 g/dL 12.6 12.6 11.6(L)  Hematocrit 36.0 - 46.0 % 41.7 37.6 38.2  Platelets 150 - 400 K/uL 291 331 329    . CMP Latest Ref Rng & Units 01/17/2019 12/01/2018 07/19/2018  Glucose 70 - 99 mg/dL 101(H) 99 95  BUN 8 - 23 mg/dL 13 16 9   Creatinine 0.44 - 1.00 mg/dL 1.07(H) 1.03(H) 0.90  Sodium 135 - 145 mmol/L 137 135 136  Potassium 3.5 - 5.1 mmol/L 5.0 4.8 4.3  Chloride 98 - 111 mmol/L 102 95(L) 100  CO2 22 - 32 mmol/L 25 25 26   Calcium 8.9 - 10.3 mg/dL 9.7 9.8 9.7  Total Protein 6.5 - 8.1 g/dL 7.3 7.1 7.3  Total Bilirubin 0.3 - 1.2 mg/dL 0.3 0.3 0.3  Alkaline Phos 38 - 126 U/L 80 94 102  AST 15 - 41 U/L 24 22 17   ALT 0 - 44 U/L 16 13 11     PATHOLOGY       RADIOGRAPHIC STUDIES: I have personally reviewed the radiological images as listed and agreed with the findings in the report. Ct Chest Wo Contrast  Result Date: 01/10/2019 CLINICAL DATA:  Lung ca; xrt comp 03/2018; chronic cough ; SOBLung cancer, non-small cell, staging Lung nodule, >=1cm recurrent non small cell lung cancer s/p SBRT for RUL and LUL nodules with in the last 12 months. monitor for lung cancer progression and new lung lesions EXAM: CT CHEST WITHOUT CONTRAST TECHNIQUE: Multidetector CT imaging of the chest was performed following the standard protocol without IV contrast. COMPARISON:  CT July 12, 2018 FINDINGS: Cardiovascular: Coronary artery calcification and aortic atherosclerotic calcification. Mediastinum/Nodes: No axillary or supraclavicular adenopathy. No mediastinal hilar adenopathy. No pericardial effusion. Lungs/Pleura:  Nodular thickening in the posterior aspect of the RIGHT upper lobe with peripheral radiation change in the adjacent lung parenchyma. The largest nodular lesion measures 1.5 x 1.1 cm compared to 2.2 x 1.2 cm. There is pleural thickening adjacent to these nodular lesions. Angular nodule in the lingula measures 5 mm (image 84/7) this nodule is new from prior. More superior there previously described 1 cm nodule is less well-defined there is a band of parenchymal fibrotic change indicating radiation parenchymal injury Upper Abdomen: No acute abnormality. Musculoskeletal: No chest wall mass or suspicious bone lesions identified. IMPRESSION: 1. Decrease in size of lingular nodule with a arc like band of radiation change within the lingula. Minimal nodularity remains. 2. New lingular nodule more inferiorly is not clearly seen on prior and may represent atelectasis. Recommend attention on follow-up. 3. Decreased size of peripheral nodule in the RIGHT upper lobe along the pleural surface also with evidence of radiation change surrounding the nodule. 4. No mediastinal adenopathy. Electronically Signed   By: Suzy Bouchard M.D.   On: 01/10/2019 19:46    ASSESSMENT & PLAN:   Chelsea Baker is a 73 y.o. female with   1) h/o right upper lobe squamous cell carcinoma of the lung. Stage I Presenting with a cavitary RUL mass.  Biopsy proven to be squamous cell carcinoma. No overt mediastinal or hilar lymphadenopathy noted. PET/CT showed no overt metastatic disease CT head neg for mets. Patient declined MRI S/p SBRT 60 Gy in 5 fractions 09/01/17 to 09/10/17  CT Chest 10/22/17 shows improvement in the right upper lobe mass after [facility]  03/01/18 CT Chest revealed Continued reduction in spiculated posterior right upper lobe pulmonary nodule. 2. Left upper lobe 1.6 cm spiculated pulmonary nodule is increased in size and density, compatible with malignancy, either a second primary bronchogenic carcinoma or contralateral  metastasis. 3. No thoracic adenopathy.  03/18/18 PET/CT revealed Intense metabolic activity associated with enlarging LEFT upper lobe pulmonary nodule is most suggestive primary bronchogenic Carcinoma. 2. Mild to moderate activity associated treated pulmonary nodule in the RIGHT upper lobe with associated moderate metabolic activity in adjacent chest wall favored post radiation change inflammation. 3. Stable additional RIGHT lower lobe pulmonary nodule. 4. No new pulmonary nodules evident. 5. No evidence of metastatic mediastinal adenopathy or distant disease.  07/12/18 CT Chest which revealed 2.2 x 1.2 cm irregular/spiculated posterior right upper lobe nodule, grossly unchanged. 5 x 10 mm left upper lobe nodule, decreased. Aortic Atherosclerosis and Emphysema.  2) Now with Left upper lobe 1.6 cm spiculated pulmonary nodule- increasing in size. Seen on the 03/18/18 PET/CT as noted above. Newly identified Left enlarging nodule is suspected to be a new primary, and would be considered a new Stage I tumor S/p 54 Gy in 3 fractions 04/06/18 to 04/13/18  3) Dyspnea and rest and on minimal exertion. - much improved CT chest with no PE  significant COPD at baseline -  Needing 2-3 L of oxygen by nasal cannula in hospital. and is now off oxygen ECHO ef 35-40% -Pulmonary function testing -severe obstruction - optimize rx -noted to have systolic cardiomyopathy - mx Dr Ellyn Hack. -Continue follow up with Dr. Sherrilyn Rist in Pulmonology   4) Poor medical f/u. Past Medical History:  Diagnosis Date   AAA (abdominal aortic aneurysm) (HCC)    Diverticulosis    GERD (  gastroesophageal reflux disease)    Hiatal hernia    Hypertension    Hypokalemia    Lumbar spondylolysis    Lung cancer ([place]) dx'd 2019   Lung tumor    On home O2    2L N/C   4) Protein calorie malnutrition -nutritional consultation  5) Heavy smoker 75-100 pk-yrs -Pt reported on 08/12/17 that she has completely quit smoking  and plans to never smoke again.  -Pt has continued smoking cessation as of 03/08/18 and was encouraged to keep up her good work.   PLAN: -Discussed pt labwork today, 01/17/19; all values are WNL except for glucose at 101, creatinine at 1.07, GFR non af at 51, and GFR AFR at 60. -Discussed 01/10/2019 chest CT with results revealing Decrease in size of lingular nodule with a arc like band of radiation change within the lingula. Minimal nodularity remains. New lingular nodule more inferiorly is not clearly seen on prior and may represent atelectasis. Recommend attention on follow-up. Decreased size of peripheral nodule in the RIGHT upper lobe along the pleural surface also with evidence of radiation change surrounding the nodule. No mediastinal adenopathy. -Discussed follow up with PCP -Discussed the importance of social distancing during the pandemic   FOLLOW UP: - CT chest without contrast in 16 weeks -RTC with Dr [name] in 4 months with labs    All of the patients questions were answered with apparent satisfaction. The patient knows to call the clinic with any problems, questions or concerns.  The total time spent in the appt was 25 minutes and more than 50% was on counseling and direct patient cares.    Sullivan Lone MD MS AAHIVMS [facility] Hematology/Oncology Physician [facility]  (Office):       [phone] (Work cell):  [phone] (Fax):           [phone]  I, Jacqualyn Posey, am acting as a scribe for Dr. Sullivan Lone.   .I have reviewed the above documentation for accuracy and completeness, and I agree with the above. [name] MD

## 2019-04-20 ENCOUNTER — Ambulatory Visit: Payer: PPO | Admitting: Adult Health

## 2019-05-09 ENCOUNTER — Ambulatory Visit (HOSPITAL_COMMUNITY): Payer: PPO

## 2019-05-11 ENCOUNTER — Telehealth: Payer: Self-pay | Admitting: Hematology

## 2019-05-11 NOTE — Telephone Encounter (Signed)
[place] [facility] 11/20 lab/fu rescheduled. Lab moved ti 11/17 with ct and f/u moved to 12/3. Confirmed with patient.

## 2019-05-16 ENCOUNTER — Encounter (HOSPITAL_COMMUNITY): Payer: Self-pay

## 2019-05-16 ENCOUNTER — Other Ambulatory Visit: Payer: Self-pay

## 2019-05-16 ENCOUNTER — Ambulatory Visit (HOSPITAL_COMMUNITY)
Admission: RE | Admit: 2019-05-16 | Discharge: 2019-05-16 | Disposition: A | Discharge status: Home / Self Care | Payer: PPO | Source: Ambulatory Visit | Attending: Hematology | Admitting: Hematology

## 2019-05-16 ENCOUNTER — Inpatient Hospital Stay: Payer: PPO | Attending: Hematology

## 2019-05-16 DIAGNOSIS — C3411 Malignant neoplasm of upper lobe, right bronchus or lung: Secondary | ICD-10-CM

## 2019-05-16 DIAGNOSIS — R911 Solitary pulmonary nodule: Secondary | ICD-10-CM | POA: Insufficient documentation

## 2019-05-16 DIAGNOSIS — R0602 Shortness of breath: Secondary | ICD-10-CM | POA: Diagnosis not present

## 2019-05-16 LAB — CBC WITH DIFFERENTIAL/PLATELET
Abs Immature Granulocytes: 0.01 10*3/uL (ref 0.00–0.07)
Basophils Absolute: 0 10*3/uL (ref 0.0–0.1)
Basophils Relative: 1 %
Eosinophils Absolute: 0.2 10*3/uL (ref 0.0–0.5)
Eosinophils Relative: 3 %
HCT: 41.5 % (ref 36.0–46.0)
Hemoglobin: 12.7 g/dL (ref 12.0–15.0)
Immature Granulocytes: 0 %
Lymphocytes Relative: 23 %
Lymphs Abs: 1.3 10*3/uL (ref 0.7–4.0)
MCH: 27.2 pg (ref 26.0–34.0)
MCHC: 30.6 g/dL (ref 30.0–36.0)
MCV: 88.9 fL (ref 80.0–100.0)
Monocytes Absolute: 0.5 10*3/uL (ref 0.1–1.0)
Monocytes Relative: 9 %
Neutro Abs: 3.7 10*3/uL (ref 1.7–7.7)
Neutrophils Relative %: 64 %
Platelets: 297 10*3/uL (ref 150–400)
RBC: 4.67 MIL/uL (ref 3.87–5.11)
RDW: 14.6 % (ref 11.5–15.5)
WBC: 5.8 10*3/uL (ref 4.0–10.5)
nRBC: 0 % (ref 0.0–0.2)

## 2019-05-16 LAB — CMP (CANCER CENTER ONLY)
ALT: 17 U/L (ref 0–44)
AST: 21 U/L (ref 15–41)
Albumin: 4.1 g/dL (ref 3.5–5.0)
Alkaline Phosphatase: 76 U/L (ref 38–126)
Anion gap: 12 (ref 5–15)
BUN: 24 mg/dL — ABNORMAL HIGH (ref 8–23)
CO2: 25 mmol/L (ref 22–32)
Calcium: 9.5 mg/dL (ref 8.9–10.3)
Chloride: 100 mmol/L (ref 98–111)
Creatinine: 1.2 mg/dL — ABNORMAL HIGH (ref 0.44–1.00)
GFR, Est AFR Am: 52 mL/min — ABNORMAL LOW (ref >60)
GFR, Estimated: 45 mL/min — ABNORMAL LOW (ref >60)
Glucose, Bld: 89 mg/dL (ref 70–99)
Potassium: 4.7 mmol/L (ref 3.5–5.1)
Sodium: 137 mmol/L (ref 135–145)
Total Bilirubin: <0.2 mg/dL — ABNORMAL LOW (ref 0.3–1.2)
Total Protein: 7.5 g/dL (ref 6.5–8.1)

## 2019-05-19 ENCOUNTER — Ambulatory Visit: Payer: PPO | Admitting: Hematology

## 2019-05-19 ENCOUNTER — Other Ambulatory Visit: Payer: PPO

## 2019-06-01 ENCOUNTER — Inpatient Hospital Stay: Payer: PPO | Attending: Hematology | Admitting: Hematology

## 2019-06-01 DIAGNOSIS — R911 Solitary pulmonary nodule: Secondary | ICD-10-CM | POA: Diagnosis not present

## 2019-06-01 DIAGNOSIS — C3492 Malignant neoplasm of unspecified part of left bronchus or lung: Secondary | ICD-10-CM

## 2019-06-01 NOTE — Progress Notes (Signed)
HEMATOLOGY/ONCOLOGY CLINIC NOTE  Date of Service: 06/01/2019  Patient Care Team: Esaw Grandchild, NP as PCP - General (Family Medicine)  CHIEF COMPLAINTS/PURPOSE OF CONSULTATION:   F/u for recurent squamous cell lung cancer   HISTORY OF PRESENTING ILLNESS:    Chelsea Baker is a wonderful 73 y.o. female who has been referred to [place] by Dr .Sloan Leiter, Henreitta Leber, MD  for evaluation and management of newly diagnosed Squmous cell carcinoma of the RUL.  Patient has a history of hypertension, nicotine dependence, GERD, hiatal hernia, AAA and presented with a 2-week history of cough, weight loss about 20 pounds in about 6 months and increasing shortness of breath over the last several days prior to admission.  In the ED the patient had a chest x-ray which showed a 3.4 cm Which showed a 3.4 cm right upper lobe mass which appeared cavitary.  Subsequent CT of the chest showed a 3.5 cm cavitary mass involving the right upper lobe concerning for a primary bronchogenic carcinoma.  She was also noted to have a 4 mm nodule which is indeterminate in the right lower lobe.  No evidence of metastatic disease noted in the chest or upper abdomen. No evidence of pulmonary embolism.  Patient subsequently had a CT-guided biopsy of the right upper lobe lung lesion on 07/15/2017.  Pathology revealed squamous cell carcinoma of the lung. She had other workup including an AFB smear which was negative.  Patient notes that she does not really have a primary care physician and has not had good medical follow-up and does not really see a doctor regularly. She has had a history of smoking 1/2-2 packs/day for 50 years.  No diagnosed COPD though likely has significant decline in lung function at baseline. Currently was short of breath despite lung cancer not involving a large part of the lung at this time and no evidence of PE.  Still needing 2-3 L of oxygen by nasal cannula currently and endorses shortness of  breath with minimal walking.  She notes that she lives with her son.   INTERVAL HISTORY   I connected with Chelsea Baker on 06/01/19 at 12:20 PM EST by TELEHEALTH and verified that I am speaking with the correct person using two identifiers.   I discussed the limitations, risks, security and privacy concerns of performing an evaluation and management service by telemedicine and the availability of in-person appointments. I also discussed with the patient that there may be a patient responsible charge related to this service. The patient expressed understanding and agreed to proceed.   Other persons participating in the visit and their role in the encounter: [place]   Patient's location: HOME  Provider's location: [facility]   Chief Complaint: F/u for recurent squamous cell lung cancer.  Chelsea Baker is here regarding her lung cancer. The patient's last visit with [place] was on 01/17/2019. The pt reports that she is doing well overall.  The pt reports she feels great.  She still has some shortness of breath. She has an inhaler and nebulizer that she uses.  Of note since the patient's last visit, pt has had CT Chest Wo Contrast (Accession [id number]) completed on 05/16/2019 with results revealing "Radiation changes in the posterior right upper lobe and left upper lobe/lingula.10 mm lingular nodule, progressive, suspicious for recurrence.Aortic Atherosclerosis (ICD10-I70.0) and Emphysema (ICD10-J43.9)."  Of note since the patient's last visit, pt has had CT Chest Wo Contrast (Accession [id number]) completed on 01/10/2019 with results revealing "  1. Decrease in size of lingular nodule with a arc like band of radiation change within the lingula. Minimal nodularity remains. 2. New lingular nodule more inferiorly is not clearly seen on prior and may represent atelectasis. Recommend attention on follow-up. 3. Decreased size of peripheral nodule in the RIGHT upper lobe along the  pleural surface also with evidence of radiation change surrounding the nodule. 4. No mediastinal adenopathy.  Lab results today (05/16/2019) of CBC w/diff and CMP is as follows: all values are WNL except for BUN at 24, Creatinine at 1.20, Total bilirubin at <0.2, GFR Est Non Af Am at 45, GFR Est AFR AM at 52.  On review of systems, pt reports healthy weight gain and denies any major changes in her health and any other symptoms.    MEDICAL HISTORY:  Past Medical History:  Diagnosis Date  . AAA (abdominal aortic aneurysm) ([place])   . Diverticulosis   . GERD (gastroesophageal reflux disease)   . Hiatal hernia   . Hypertension   . Hypokalemia   . Lumbar spondylolysis   . Lung cancer ([place]) dx'd 2019  . Lung tumor   . On home O2    2L N/C    SURGICAL HISTORY: Past Surgical History:  Procedure Laterality Date  . ABDOMINAL AORTIC ANEURYSM REPAIR  10/01/10   endostent repair  . CATARACT EXTRACTION, BILATERAL    . LUNG BIOPSY    . NM MYOVIEW LTD  2012   Normal Lexiscan Myoview with no ischemia or infarction.  Normal LV function.  EF 67%.  . TONSILLECTOMY    . TRANSTHORACIC ECHOCARDIOGRAM  07/17/2017   EF 35-40%.  Septal mid and basal inferior wall hypokinesis.  Mildly dilated LV.  Mildly reduced EF.  GR 1 DD.  Calcified mitral annulus.     SOCIAL HISTORY: Social History   Socioeconomic History  . Marital status: Single    Spouse name: Not on file  . Number of children: Not on file  . Years of education: Not on file  . Highest education level: Not on file  Occupational History  . Not on file  Social Needs  . Financial resource strain: Not on file  . Food insecurity    Worry: Not on file    Inability: Not on file  . Transportation needs    Medical: Not on file    Non-medical: Not on file  Tobacco Use  . Smoking status: Former Smoker    Packs/day: 1.00    Years: 50.00    Pack years: 50.00    Types: Cigarettes    Quit date: 06/2017    Years since quitting: 1.9  .  Smokeless tobacco: Never Used  Substance and Sexual Activity  . Alcohol use: Yes    Comment: rarely  . Drug use: No  . Sexual activity: Not Currently  Lifestyle  . Physical activity    Days per week: Not on file    Minutes per session: Not on file  . Stress: Not on file  Relationships  . Social Herbalist on phone: Not on file    Gets together: Not on file    Attends religious service: Not on file    Active member of club or organization: Not on file    Attends meetings of clubs or organizations: Not on file    Relationship status: Not on file  . Intimate partner violence    Fear of current or ex partner: No    Emotionally abused: No  Physically abused: No    Forced sexual activity: No  Other Topics Concern  . Not on file  Social History Narrative  . Not on file    FAMILY HISTORY: Family History  Problem Relation Age of Onset  . Heart failure Mother   . Heart attack Mother 37       In 6s - 1st MI  . Coronary artery disease Mother   . Coronary artery disease Father        Died at age 10  . Heart attack Father 42  . Heart disease Maternal Grandmother   . Heart disease Maternal Grandfather   . Cancer Neg Hx     ALLERGIES:  has No Known Allergies.  MEDICATIONS:  Current Outpatient Medications  Medication Sig Dispense Refill  . acetaminophen (TYLENOL) 500 MG tablet Take 500 mg by mouth every 6 (six) hours as needed.    Marland Kitchen albuterol (PROVENTIL HFA;VENTOLIN HFA) 108 (90 Base) MCG/ACT inhaler Inhale 2 puffs into the lungs every 4 (four) hours as needed for wheezing or shortness of breath. 1 Inhaler 3  . budesonide (PULMICORT) 0.5 MG/2ML nebulizer solution Take 2 mLs (0.5 mg total) by nebulization 2 (two) times daily. 120 mL 5  . ipratropium-albuterol (DUONEB) 0.5-2.5 (3) MG/3ML SOLN Take 3 mLs by nebulization every 4 (four) hours as needed. 90 mL 0  . metoprolol succinate (TOPROL-XL) 50 MG 24 hr tablet TAKE 1 TABLET (50 MG TOTAL) BY MOUTH DAILY. TAKE WITH OR  IMMEDIATELY FOLLOWING A MEAL. 90 tablet 1   No current facility-administered medications for this visit.     REVIEW OF SYSTEMS:   A 10+ POINT REVIEW OF SYSTEMS WAS OBTAINED A 10+ POINT REVIEW OF SYSTEMS WAS OBTAINED including neurology, dermatology, psychiatry, cardiac, respiratory, lymph, extremities, GI, GU, Musculoskeletal, constitutional, breasts, reproductive, HEENT.  All pertinent positives are noted in the HPI.  All others are negative.      PHYSICAL EXAMINATION: There were no vitals filed for this visit. Wt Readings from Last 3 Encounters:  01/17/19 127 lb 6.4 oz (57.8 kg)  01/12/19 126 lb 6.4 oz (57.3 kg)  12/19/18 130 lb 12.8 oz (59.3 kg)   There is no height or weight on file to calculate BMI.    Telehealth Visit 06/01/19   LABORATORY DATA:  I have reviewed the data as listed  . CBC Latest Ref Rng & Units 05/16/2019 01/17/2019 12/01/2018  WBC 4.0 - 10.5 K/uL 5.8 6.1 6.5  Hemoglobin 12.0 - 15.0 g/dL 12.7 12.6 12.6  Hematocrit 36.0 - 46.0 % 41.5 41.7 37.6  Platelets 150 - 400 K/uL 297 291 331    . CMP Latest Ref Rng & Units 05/16/2019 01/17/2019 12/01/2018  Glucose 70 - 99 mg/dL 89 101(H) 99  BUN 8 - 23 mg/dL 24(H) 13 16  Creatinine 0.44 - 1.00 mg/dL 1.20(H) 1.07(H) 1.03(H)  Sodium 135 - 145 mmol/L 137 137 135  Potassium 3.5 - 5.1 mmol/L 4.7 5.0 4.8  Chloride 98 - 111 mmol/L 100 102 95(L)  CO2 22 - 32 mmol/L 25 25 25   Calcium 8.9 - 10.3 mg/dL 9.5 9.7 9.8  Total Protein 6.5 - 8.1 g/dL 7.5 7.3 7.1  Total Bilirubin 0.3 - 1.2 mg/dL <0.2(L) 0.3 0.3  Alkaline Phos 38 - 126 U/L 76 80 94  AST 15 - 41 U/L 21 24 22   ALT 0 - 44 U/L 17 16 13     PATHOLOGY      RADIOGRAPHIC STUDIES: I have personally reviewed the radiological images as listed  and agreed with the findings in the report. Ct Chest Wo Contrast  Result Date: 05/16/2019 CLINICAL DATA:  Lung cancer, diagnosed 2019, XRT complete. Chronic cough and shortness of breath. EXAM: CT CHEST WITHOUT CONTRAST  TECHNIQUE: Multidetector CT imaging of the chest was performed following the standard protocol without IV contrast. COMPARISON:  None. FINDINGS: Cardiovascular: The heart is normal in size. No pericardial effusion. No evidence of thoracic aortic aneurysm. Ectasia of the ascending thoracic aorta, measuring 3.6 cm. Atherosclerotic calcifications of the aortic arch. Three vessel coronary atherosclerosis. Mediastinum/Nodes: No suspicious mediastinal lymphadenopathy. Visualized thyroid is unremarkable. Lungs/Pleura: Radiation changes in the posterior right upper lobe. Platelike radiation changes in the left upper lobe/lingula. 9 x 10 mm lingular nodule (series 5/image 83), previously 5 mm, suspicious for recurrence. Mild centrilobular and paraseptal emphysematous changes, upper lung predominant. No focal consolidation. No pleural effusion or pneumothorax. Upper Abdomen: Visualized upper abdomen is grossly unremarkable, noting vascular calcifications. Musculoskeletal: Degenerative changes of the visualized thoracolumbar spine. IMPRESSION: Radiation changes in the posterior right upper lobe and left upper lobe/lingula. 10 mm lingular nodule, progressive, suspicious for recurrence. Aortic Atherosclerosis (ICD10-I70.0) and Emphysema (ICD10-J43.9). Electronically Signed   By: Julian Hy M.D.   On: 05/16/2019 15:49    ASSESSMENT & PLAN:   Chelsea Baker is a 73 y.o. female with   1) h/o right upper lobe squamous cell carcinoma of the lung. Stage I Presenting with a cavitary RUL mass.  Biopsy proven to be squamous cell carcinoma. No overt mediastinal or hilar lymphadenopathy noted. PET/CT showed no overt metastatic disease CT head neg for mets. Patient declined MRI S/p SBRT 60 Gy in 5 fractions 09/01/17 to 09/10/17  CT Chest 10/22/17 shows improvement in the right upper lobe mass after [facility]  03/01/18 CT Chest revealed Continued reduction in spiculated posterior right upper lobe pulmonary nodule. 2. Left upper  lobe 1.6 cm spiculated pulmonary nodule is increased in size and density, compatible with malignancy, either a second primary bronchogenic carcinoma or contralateral metastasis. 3. No thoracic adenopathy.  03/18/18 PET/CT revealed Intense metabolic activity associated with enlarging LEFT upper lobe pulmonary nodule is most suggestive primary bronchogenic Carcinoma. 2. Mild to moderate activity associated treated pulmonary nodule in the RIGHT upper lobe with associated moderate metabolic activity in adjacent chest wall favored post radiation change inflammation. 3. Stable additional RIGHT lower lobe pulmonary nodule. 4. No new pulmonary nodules evident. 5. No evidence of metastatic mediastinal adenopathy or distant disease.  07/12/18 CT Chest which revealed 2.2 x 1.2 cm irregular/spiculated posterior right upper lobe nodule, grossly unchanged. 5 x 10 mm left upper lobe nodule, decreased. Aortic Atherosclerosis and Emphysema.  2) Now with Left upper lobe 1.6 cm spiculated pulmonary nodule- increasing in size. Seen on the 03/18/18 PET/CT as noted above. Newly identified Left enlarging nodule is suspected to be a new primary, and would be considered a new Stage I tumor S/p 54 Gy in 3 fractions 04/06/18 to 04/13/18  3) Dyspnea and rest and on minimal exertion. - much improved CT chest with no PE  significant COPD at baseline -  Needing 2-3 L of oxygen by nasal cannula in hospital. and is now off oxygen ECHO ef 35-40% -Pulmonary function testing -severe obstruction - optimize rx -noted to have systolic cardiomyopathy - mx Dr Ellyn Hack. -Continue follow up with Dr. Sherrilyn Rist in Pulmonology   4) Poor medical f/u. Past Medical History:  Diagnosis Date  . AAA (abdominal aortic aneurysm) ([place])   . Diverticulosis   . GERD (  gastroesophageal reflux disease)   . Hiatal hernia   . Hypertension   . Hypokalemia   . Lumbar spondylolysis   . Lung cancer ([place]) dx'd 2019  . Lung tumor   . On home O2     2L N/C   4) Protein calorie malnutrition -nutritional consultation  5) Heavy smoker 75-100 pk-yrs -Pt reported on 08/12/17 that she has completely quit smoking and plans to never smoke again.  -Pt has continued smoking cessation as of 03/08/18 and was encouraged to keep up her good work.   PLAN: -Discussed pt labwork today, 05/16/2019;  all values are WNL except for BUN at 24, Creatinine at 1.20, Total bilirubin at <0.2, GFR Est Non Af Am at 45, GFR Est AFR AM at 52. -Blood Counts are normal -Blood chemistries are normal, pt is slightly dehydrated.  -Discussed CT Chest Wo Contrast (Accession [id number]) completed on 05/16/2019 with results revealing "Radiation changes in the posterior right upper lobe and left upper lobe/lingula.10 mm lingular nodule, progressive, suspicious for recurrence.Aortic Atherosclerosis (ICD10-I70.0) and Emphysema (ICD10-J43.9)." -Discussed that pt needs a PET scan to determine further treatment , pt would like to schedule PET scan as soon as possible. -Follow up with Dr. Lisbeth Renshaw in radiation oncology for consideration of ?SBRT for enlarging concerning lung nodule in the lingula based on PET charateristics.  FOLLOW UP: PET/CT in 1 week F/u with Dr Lisbeth Renshaw in radiation oncology in 2 weeks CT chest in 24 weeks with labs  RTC with Dr [name] in 6 months  The total time spent in the appt was 25 minutes and more than 50% was on counseling and direct patient cares.  All of the patient's questions were answered with apparent satisfaction. The patient knows to call the clinic with any problems, questions or concerns.    Sullivan Lone MD [place] AAHIVMS [facility] Hematology/Oncology Physician [facility]  (Office):       [phone] (Work cell):  [phone] (Fax):           [phone] I, Scot Dock, am acting as a scribe for Dr. Sullivan Lone.   .I have reviewed the above documentation for accuracy and completeness, and I agree with the above. [name] MD

## 2019-06-02 ENCOUNTER — Telehealth: Payer: Self-pay | Admitting: Radiation Oncology

## 2019-06-02 NOTE — Telephone Encounter (Signed)
New message:   LVM for patient to return call to schedule appt from referral received

## 2019-06-08 ENCOUNTER — Ambulatory Visit (HOSPITAL_COMMUNITY)
Admission: RE | Admit: 2019-06-08 | Discharge: 2019-06-08 | Disposition: A | Discharge status: Home / Self Care | Payer: PPO | Source: Ambulatory Visit | Attending: Hematology | Admitting: Hematology

## 2019-06-08 ENCOUNTER — Other Ambulatory Visit: Payer: Self-pay

## 2019-06-08 DIAGNOSIS — I2510 Atherosclerotic heart disease of native coronary artery without angina pectoris: Secondary | ICD-10-CM | POA: Diagnosis not present

## 2019-06-08 DIAGNOSIS — I700 Atherosclerosis of aorta: Secondary | ICD-10-CM | POA: Diagnosis not present

## 2019-06-08 DIAGNOSIS — C3490 Malignant neoplasm of unspecified part of unspecified bronchus or lung: Secondary | ICD-10-CM | POA: Diagnosis not present

## 2019-06-08 DIAGNOSIS — R911 Solitary pulmonary nodule: Secondary | ICD-10-CM | POA: Diagnosis not present

## 2019-06-08 DIAGNOSIS — K5730 Diverticulosis of large intestine without perforation or abscess without bleeding: Secondary | ICD-10-CM | POA: Diagnosis not present

## 2019-06-08 DIAGNOSIS — N8189 Other female genital prolapse: Secondary | ICD-10-CM | POA: Diagnosis not present

## 2019-06-08 LAB — GLUCOSE, CAPILLARY: Glucose-Capillary: 96 mg/dL (ref 70–99)

## 2019-06-08 MED ORDER — FLUDEOXYGLUCOSE F - 18 (FDG) INJECTION
6.3000 | Freq: Once | INTRAVENOUS | Status: AC | PRN
  Administered 2019-06-08: 12:00:00 6.3 via INTRAVENOUS

## 2019-06-13 ENCOUNTER — Other Ambulatory Visit: Payer: Self-pay

## 2019-06-13 ENCOUNTER — Ambulatory Visit
Admission: RE | Admit: 2019-06-13 | Discharge: 2019-06-13 | Disposition: A | Discharge status: Home / Self Care | Payer: PPO | Source: Ambulatory Visit | Attending: Radiation Oncology | Admitting: Radiation Oncology

## 2019-06-13 DIAGNOSIS — Z85118 Personal history of other malignant neoplasm of bronchus and lung: Secondary | ICD-10-CM | POA: Diagnosis not present

## 2019-06-13 DIAGNOSIS — C3412 Malignant neoplasm of upper lobe, left bronchus or lung: Secondary | ICD-10-CM

## 2019-06-13 DIAGNOSIS — R918 Other nonspecific abnormal finding of lung field: Secondary | ICD-10-CM | POA: Diagnosis not present

## 2019-06-13 DIAGNOSIS — C3411 Malignant neoplasm of upper lobe, right bronchus or lung: Secondary | ICD-10-CM

## 2019-06-13 DIAGNOSIS — Z923 Personal history of irradiation: Secondary | ICD-10-CM | POA: Diagnosis not present

## 2019-06-13 DIAGNOSIS — Z87892 Personal history of anaphylaxis: Secondary | ICD-10-CM | POA: Diagnosis not present

## 2019-06-13 NOTE — Progress Notes (Addendum)
Radiation Oncology         [phone] ________________________________  Outpatient Follow Up - Conducted via telephone due to current COVID-19 concerns for limiting patient exposure  I spoke with the patient to conduct this consult visit via telephone to spare the patient unnecessary potential exposure in the healthcare setting during the current COVID-19 pandemic. The patient was notified in advance and was offered a [place] meeting to allow for face to face communication but unfortunately reported that they did not have the appropriate resources/technology to support such a visit and instead preferred to proceed with a telephone visit.   ________________________________  Name: Chelsea Baker MRN: [id number]  Date of Service: 06/13/2019 DOB: Jan 29, 1946  Reconsult Note  CC: Chelsea Grandchild, NP  Chelsea Genera, MD  Diagnosis:   Stage IB, cT2aN0M0, NSCLC, squamous cell carcinoma of the right upper lobe with a putative Stage IA2, cT1bN0M0 NSCLC of the LUL.  Interval Since Last Radiation:  14 months  04/06/18-04/13/18 SBRT Treatment: LUL tumor was treated to 54 Gy in 3 fractions  09/01/17-09/10/17 SBRT Treatment:  RUL tumor treated to 60 Gy in 5 fractions  Narrative:  Chelsea Baker is a pleasant 73 y.o. female with a known history of stage Ib, cT2aN0M0 NSCLC, squamous cell carcinoma of the right upper lobe and a putative Stage IA2, cT1bN0M0 NSCLC of the LUL.  She was originally seen in February 2019 after cough and fever evaluation led to imaging detecting a 3.5 cm mass in the right upper lobe and a 4 mm lesion in little right lower lobe.  There was an area in the lingula that was approximately 1 cm at that time and no adenopathy was noted.  CT-guided biopsy during her work-up in January revealed a squamous cell carcinoma of the right upper lobe lesion.  Her metastatic work-up was also negative.  She was counseled on the rationale for area tactic body radiotherapy which she completed in 5  fractions to the right upper lobe on 09/10/2017.  She was followed with posttreatment imaging in April 2019 which showed improvement of the treated location as well as stability of her right lower lobe lesion and lingular lesion.  She had repeat CT imaging of the chest on 03/01/2018 under the care of Chelsea Baker.  There was improvement in the right upper lobe lesion, stability of the right lower lobe lesion, but progressive change in the lingula measuring 16 mm.  This extended into the left upper lobe.  On PET imaging on 03/18/2018 hypermetabolism with an SUV of 15 was noted in the left upper lobe lesion, low-level resolution SUV in the 4 range was noted in the previously treated right upper lobe lesion, and no hypermetabolism was noted in the right lower lobe or in the at mediastinal nodes.  She completed 3 fractions of SBRT to the left upper lobe lesion and has been in on a surveillance CT schedule. A CT of the chest on 01/10/2019 showed a deep decrease in the size of the lingular nodule with an arc-like band of radiation change minimal nodularity remains.  A new lingular nodule more inferiorly that was not clearly seen on prior was identified and was recommended to be followed closely.  Decreased size in the peripheral nodule in the right upper lobe was also seen.  A repeat scan on 05/16/2019 revealed progressive change along the lingula now measuring 9 x 10 mm, previously 5 mm and postradiation changes were seen within the right upper and left upper lobes.  A PET scan on 06/08/2019 revealed a lingular nodule now measuring 1.1 x 1 cm with an SUV of 11.1 with some increased scarring surrounding the nodule and faint nodularity at the anterior margin.  Stable band of airspace opacity in the right upper lobe was seen with low level metabolism of 3.4, and subtle nodularity in the right lower lobe did not reveal any metabolic activity measuring 3 x 4 mm.  Persistent coronary and atherosclerotic vascular disease was identified.   No adenopathy was identified.  Given these findings she is contacted today to discuss the rationale for another course of stereotactic body radiotherapy.           On review of systems, the patient reports that she is doing well overall. She reports she has been doing quite well.  She does have sensitivity to smells, and this can bring on episodes of shortness of breath or sometimes coughing spells.  She states that her breathing is worsened with exertion, but she is no longer using oxygen.  She continues to see Chelsea Baker in pulmonary medicine regularly. She denies any chest pain, shortness of breath, cough, fevers, chills, night sweats, unintended weight changes, but she has been able to gain a few pounds in the last 6 months.. She denies any bowel or bladder disturbances, and denies abdominal pain, nausea or vomiting. She denies any new musculoskeletal or joint aches or pains, new skin lesions or concerns. A complete review of systems is obtained and is otherwise negative.   PAST MEDICAL HISTORY:  Past Medical History:  Diagnosis Date  . AAA (abdominal aortic aneurysm) ([place])   . Diverticulosis   . GERD (gastroesophageal reflux disease)   . Hiatal hernia   . Hypertension   . Hypokalemia   . Lumbar spondylolysis   . Lung cancer ([place]) dx'd 2019  . Lung tumor   . On home O2    2L N/C    PAST SURGICAL HISTORY: Past Surgical History:  Procedure Laterality Date  . ABDOMINAL AORTIC ANEURYSM REPAIR  10/01/10   endostent repair  . CATARACT EXTRACTION, BILATERAL    . LUNG BIOPSY    . NM MYOVIEW LTD  2012   Normal Lexiscan Myoview with no ischemia or infarction.  Normal LV function.  EF 67%.  . TONSILLECTOMY    . TRANSTHORACIC ECHOCARDIOGRAM  07/17/2017   EF 35-40%.  Septal mid and basal inferior wall hypokinesis.  Mildly dilated LV.  Mildly reduced EF.  GR 1 DD.  Calcified mitral annulus.     PAST SOCIAL HISTORY:  Social History   Socioeconomic History  . Marital status: Single     Spouse name: Not on file  . Number of children: Not on file  . Years of education: Not on file  . Highest education level: Not on file  Occupational History  . Not on file  Tobacco Use  . Smoking status: Former Smoker    Packs/day: 1.00    Years: 50.00    Pack years: 50.00    Types: Cigarettes    Quit date: 06/2017    Years since quitting: 1.9  . Smokeless tobacco: Never Used  Substance and Sexual Activity  . Alcohol use: Yes    Comment: rarely  . Drug use: No  . Sexual activity: Not Currently  Other Topics Concern  . Not on file  Social History Narrative  . Not on file   Social Determinants of Health   Financial Resource Strain:   . Difficulty of Paying Living  Expenses: Not on file  Food Insecurity:   . Worried About Charity fundraiser in the Last Year: Not on file  . Ran Out of Food in the Last Year: Not on file  Transportation Needs:   . Lack of Transportation (Medical): Not on file  . Lack of Transportation (Non-Medical): Not on file  Physical Activity:   . Days of Exercise per Week: Not on file  . Minutes of Exercise per Session: Not on file  Stress:   . Feeling of Stress : Not on file  Social Connections:   . Frequency of Communication with Friends and Family: Not on file  . Frequency of Social Gatherings with Friends and Family: Not on file  . Attends Religious Services: Not on file  . Active Member of Clubs or Organizations: Not on file  . Attends Archivist Meetings: Not on file  . Marital Status: Not on file  Intimate Partner Violence:   . Fear of Current or Ex-Partner: Not on file  . Emotionally Abused: Not on file  . Physically Abused: Not on file  . Sexually Abused: Not on file    PAST FAMILY HISTORY: Family History  Problem Relation Age of Onset  . Heart failure Mother   . Heart attack Mother 91       In 38s - 1st MI  . Coronary artery disease Mother   . Coronary artery disease Father        Died at age 16  . Heart attack Father  58  . Heart disease Maternal Grandmother   . Heart disease Maternal Grandfather   . Cancer Neg Hx     MEDICATIONS  Current Outpatient Medications  Medication Sig Dispense Refill  . acetaminophen (TYLENOL) 500 MG tablet Take 500 mg by mouth every 6 (six) hours as needed.    Marland Kitchen albuterol (PROVENTIL HFA;VENTOLIN HFA) 108 (90 Base) MCG/ACT inhaler Inhale 2 puffs into the lungs every 4 (four) hours as needed for wheezing or shortness of breath. 1 Inhaler 3  . budesonide (PULMICORT) 0.5 MG/2ML nebulizer solution Take 2 mLs (0.5 mg total) by nebulization 2 (two) times daily. 120 mL 5  . ipratropium-albuterol (DUONEB) 0.5-2.5 (3) MG/3ML SOLN Take 3 mLs by nebulization every 4 (four) hours as needed. 90 mL 0  . metoprolol succinate (TOPROL-XL) 50 MG 24 hr tablet TAKE 1 TABLET (50 MG TOTAL) BY MOUTH DAILY. TAKE WITH OR IMMEDIATELY FOLLOWING A MEAL. 90 tablet 1   No current facility-administered medications for this encounter.    ALLERGIES: No Known Allergies  Physical Findings: Unable to examine due to encounter type  Lab Findings: Lab Results  Component Value Date   WBC 5.8 05/16/2019   HGB 12.7 05/16/2019   HCT 41.5 05/16/2019   MCV 88.9 05/16/2019   PLT 297 05/16/2019     Radiographic Findings: CT Chest Wo Contrast  Result Date: 05/16/2019 CLINICAL DATA:  Lung cancer, diagnosed 2019, XRT complete. Chronic cough and shortness of breath. EXAM: CT CHEST WITHOUT CONTRAST TECHNIQUE: Multidetector CT imaging of the chest was performed following the standard protocol without IV contrast. COMPARISON:  None. FINDINGS: Cardiovascular: The heart is normal in size. No pericardial effusion. No evidence of thoracic aortic aneurysm. Ectasia of the ascending thoracic aorta, measuring 3.6 cm. Atherosclerotic calcifications of the aortic arch. Three vessel coronary atherosclerosis. Mediastinum/Nodes: No suspicious mediastinal lymphadenopathy. Visualized thyroid is unremarkable. Lungs/Pleura: Radiation  changes in the posterior right upper lobe. Platelike radiation changes in the left upper lobe/lingula. 9 x  10 mm lingular nodule (series 5/image 83), previously 5 mm, suspicious for recurrence. Mild centrilobular and paraseptal emphysematous changes, upper lung predominant. No focal consolidation. No pleural effusion or pneumothorax. Upper Abdomen: Visualized upper abdomen is grossly unremarkable, noting vascular calcifications. Musculoskeletal: Degenerative changes of the visualized thoracolumbar spine. IMPRESSION: Radiation changes in the posterior right upper lobe and left upper lobe/lingula. 10 mm lingular nodule, progressive, suspicious for recurrence. Aortic Atherosclerosis (ICD10-I70.0) and Emphysema (ICD10-J43.9). Electronically Signed   By: Julian Hy M.D.   On: 05/16/2019 15:49   NM PET Image Restag (PS) Skull Base To Thigh  Result Date: 06/08/2019 CLINICAL DATA:  Subsequent treatment strategy for lung cancer. EXAM: NUCLEAR MEDICINE PET SKULL BASE TO THIGH TECHNIQUE: 6.3 mCi F-18 FDG was injected intravenously. Full-ring PET imaging was performed from the skull base to thigh after the radiotracer. CT data was obtained and used for attenuation correction and anatomic localization. Fasting blood glucose: 96 mg/dl COMPARISON:  Serve multiple CT chest from 05/16/2019 and PET-CT from 03/18/2018 FINDINGS: Mediastinal blood pool activity: SUV max 2.5 Liver activity: SUV max NA NECK: New accentuated activity along the anterior tongue region is likely physiologic. Incidental CT findings: none CHEST: The lingular nodule measures 1.1 by 1.0 cm on image 36/8 (formerly 1.0 by 0.9 cm on 05/16/2019) and has maximum SUV of 11.1 (formerly 15.9 on the PET-CT from 03/18/2018). There is some increased scarring or atelectasis surround the nodule along with some faint additional nodularity along its anterior margin for example on 36 through 37 of series 8. Stable band of airspace opacity posteriorly in the right  upper lobe with low-grade metabolic activity, maximum SUV 3.4, previously 3.5. The degree of airspace opacity in this vicinity is stable from 05/16/2019 but increased from 03/18/2018. Subtle additional nodularity in the right lower lobe his stay stable and demonstrates no accentuated metabolic activity. This includes a 3 by 4 mm right lower lobe nodule. Incidental CT findings: Coronary, aortic arch, and branch vessel atherosclerotic vascular disease. ABDOMEN/PELVIS: No significant abnormal hypermetabolic activity in this region. Incidental CT findings: Aorta bi-iliac stent graft. Sigmoid colon diverticulosis. Suspected pelvic floor laxity. SKELETON: No significant abnormal hypermetabolic activity in this region. Incidental CT findings: none IMPRESSION: 1. Hypermetabolic lingular nodule measuring 1.1 by 1.0 cm, maximum SUV 11.1 (formerly 15.9 on the PET-CT from 03/18/2018). 2. Stable airspace opacity posteriorly in the right upper lobe compared to recent CT examination, maximum SUV 3.4, previously 3.5. This likely represents activity related to prior radiation therapy. 3. Stable minimal nodularity in the superior segment right lower lobe is not hypermetabolic but below sensitive PET-CT size thresholds and may warrant surveillance. 4. Other imaging findings of potential clinical significance: Aortic Atherosclerosis (ICD10-I70.0). Coronary atherosclerosis. Sigmoid colon diverticulosis. Pelvic floor laxity. Electronically Signed   By: Van Clines M.D.   On: 06/08/2019 14:55    Impression/Plan: 1.  Putative Stage IA2, cT1bN0M0 NSCLC of the lingula. Dr. Lisbeth Renshaw discusses the patient's course and recent imaging.  He discusses that the location is in an amenable place to consider an additional curative dose of stereotactic body radiotherapy.  This appears to be a new primary though it is close in proximity to the site that was previously treated in the lingula, and that there may be some overlap in her prior  treatment plan.  We discussed the risks, benefits, short, and long term effects of radiotherapy, and the patient is interested in proceeding. Dr. Lisbeth Renshaw discusses the delivery and logistics of radiotherapy and anticipates a course of 5 fractions of  treatment given the possible overlap from her prior therapy.  She will proceed with simulation tomorrow at 10:30 AM.  At that time she will signed written consent to proceed. 2. Stage IB, cT2aN0M0, NSCLC, squamous cell carcinoma of the right upper lobe as well as a Putative Stage IA2, cT1bN0M0, NSCLC of the lingula extending into the LUL The patient's prior treatment sites remained stable and will continue to be followed with routine surveillance. 3. RLL lesion. This was read as overall stable on her CT and PET. This will be followed expectantly.  Given current concerns for patient exposure during the COVID-19 pandemic, this encounter was conducted via telephone.  The patient has given verbal consent for this type of encounter. The time spent during this encounter was 25 minutes and 50% of that time was spent in the coordination of her care. The attendants for this meeting include Dr. Lisbeth Renshaw, Shona Simpson, [facility] and Ileana Roup  During the encounter, Dr. Lisbeth Renshaw and Shona Simpson [facility] were located at [facility] Radiation Oncology Department.  AMBRA HAVERSTICK  was located at home.   The above documentation reflects my direct findings during this shared patient visit. Please see the separate note by Dr. Lisbeth Renshaw on this date for the remainder of the patient's plan of care.     Carola Rhine, PAC

## 2019-06-14 IMAGING — CR DG CHEST 2V
2 series · 2 of 2 positions shown · non-contrast
Comparison: 10/12/2010 radiograph

CLINICAL DATA: Productive cough, fatigue and short of breath

EXAM:
CHEST  2 VIEW

[w chest pa]
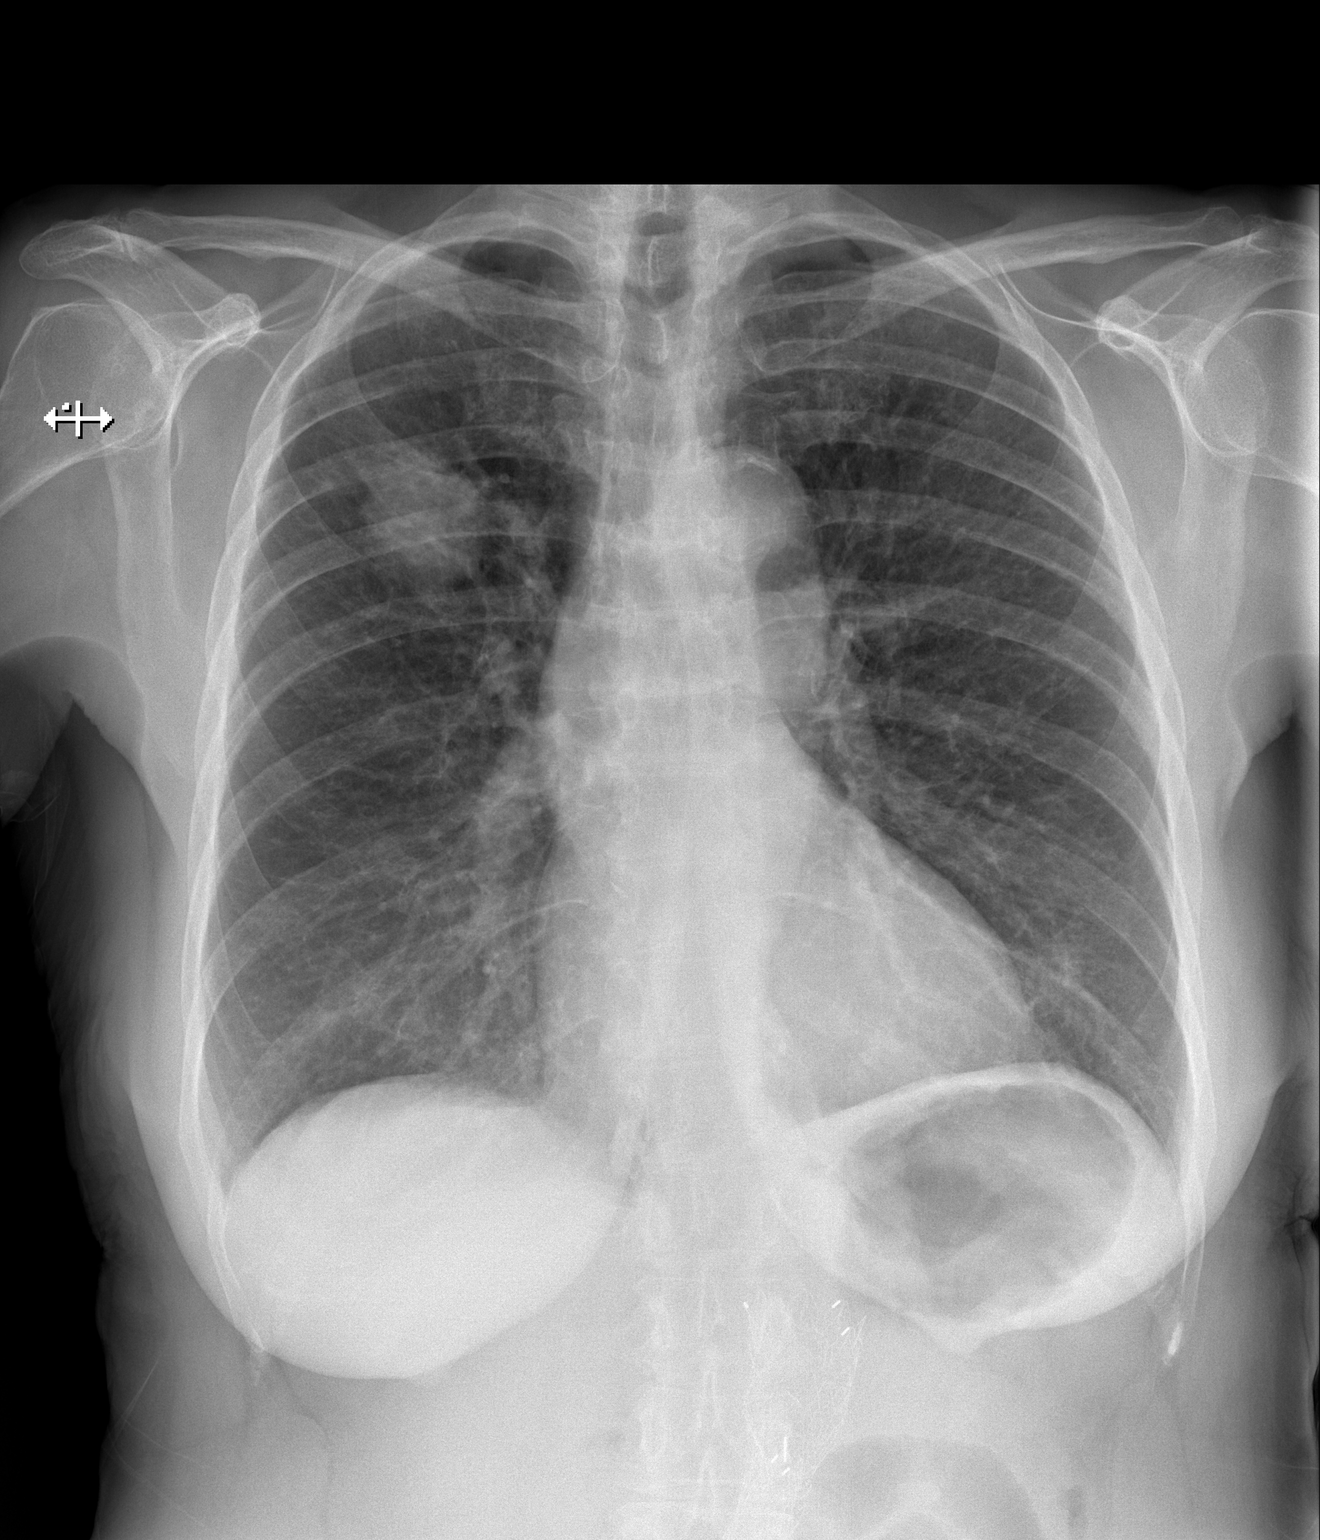

[w chest lat]
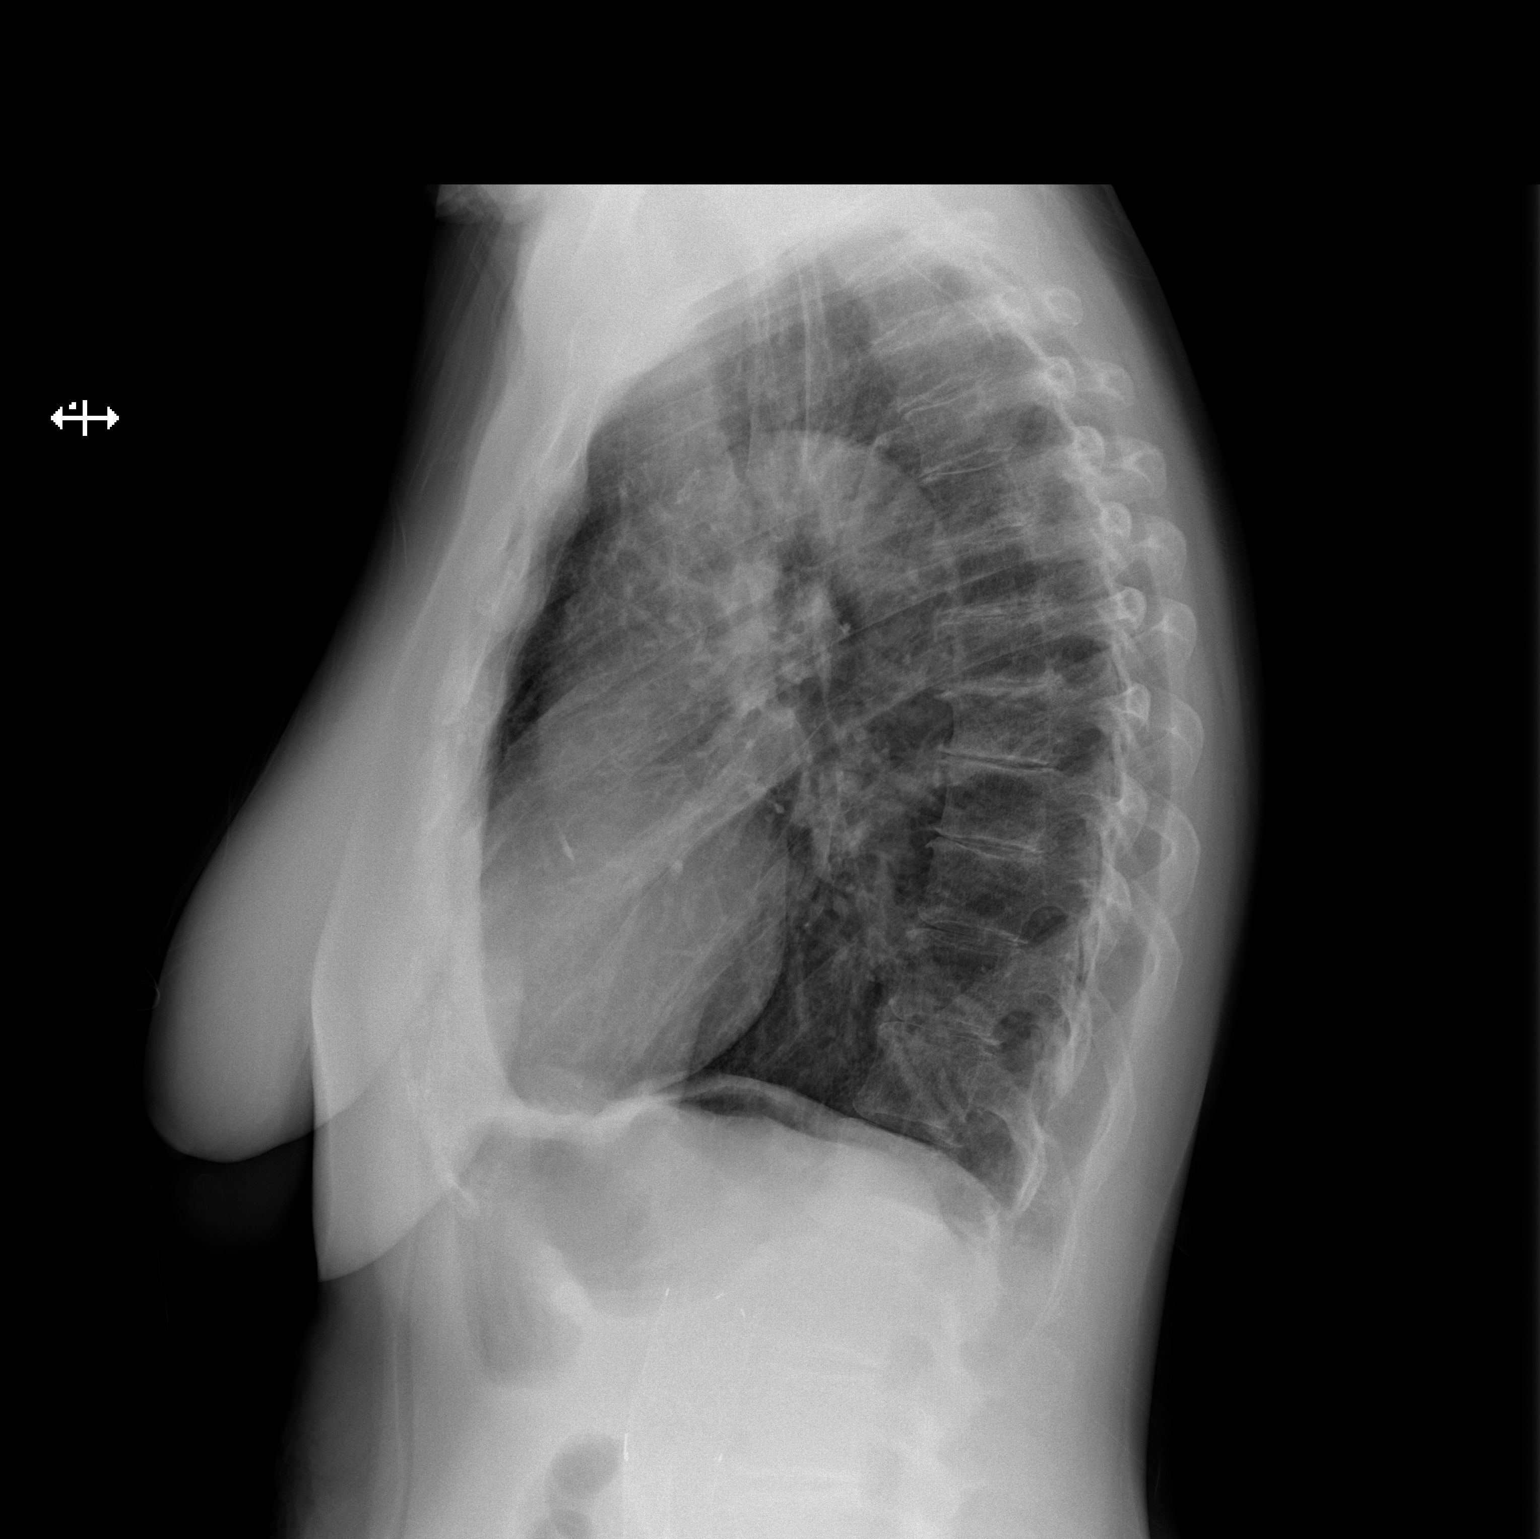

[2 of 2 positions shown; findings below may reference images not displayed]

FINDINGS: Approximate 3.4 cm right upper lobe lung mass, possibly with
cavitation on the lateral view. No pleural effusion. Normal heart
size. Aortic atherosclerosis. No pneumothorax. Partially visualized
distal aortic stent.
IMPRESSION: 1. Approximate 3.4 cm right upper lobe lung mass, possibly cavitary.
Recommend CT chest for further evaluation.

## 2019-06-15 ENCOUNTER — Other Ambulatory Visit: Payer: Self-pay

## 2019-06-15 ENCOUNTER — Ambulatory Visit
Admission: RE | Admit: 2019-06-15 | Discharge: 2019-06-15 | Disposition: A | Discharge status: Home / Self Care | Payer: PPO | Source: Ambulatory Visit | Attending: Radiation Oncology | Admitting: Radiation Oncology

## 2019-06-15 ENCOUNTER — Ambulatory Visit: Payer: PPO | Admitting: Radiation Oncology

## 2019-06-15 DIAGNOSIS — C3411 Malignant neoplasm of upper lobe, right bronchus or lung: Secondary | ICD-10-CM | POA: Diagnosis not present

## 2019-06-15 DIAGNOSIS — C3412 Malignant neoplasm of upper lobe, left bronchus or lung: Secondary | ICD-10-CM | POA: Diagnosis not present

## 2019-06-21 ENCOUNTER — Ambulatory Visit: Payer: PPO | Admitting: Radiation Oncology

## 2019-06-27 DIAGNOSIS — C3411 Malignant neoplasm of upper lobe, right bronchus or lung: Secondary | ICD-10-CM | POA: Diagnosis not present

## 2019-06-27 DIAGNOSIS — C3412 Malignant neoplasm of upper lobe, left bronchus or lung: Secondary | ICD-10-CM | POA: Diagnosis not present

## 2019-06-28 DIAGNOSIS — C3411 Malignant neoplasm of upper lobe, right bronchus or lung: Secondary | ICD-10-CM | POA: Diagnosis not present

## 2019-06-28 DIAGNOSIS — C3412 Malignant neoplasm of upper lobe, left bronchus or lung: Secondary | ICD-10-CM | POA: Diagnosis not present

## 2019-07-04 ENCOUNTER — Ambulatory Visit
Admission: RE | Admit: 2019-07-04 | Discharge: 2019-07-04 | Disposition: A | Discharge status: Home / Self Care | Payer: PPO | Source: Ambulatory Visit | Attending: Radiation Oncology | Admitting: Radiation Oncology

## 2019-07-04 ENCOUNTER — Other Ambulatory Visit: Payer: Self-pay

## 2019-07-04 DIAGNOSIS — C3412 Malignant neoplasm of upper lobe, left bronchus or lung: Secondary | ICD-10-CM | POA: Diagnosis not present

## 2019-07-04 DIAGNOSIS — Z510 Encounter for antineoplastic radiation therapy: Secondary | ICD-10-CM | POA: Insufficient documentation

## 2019-07-06 ENCOUNTER — Ambulatory Visit
Admission: RE | Admit: 2019-07-06 | Discharge: 2019-07-06 | Disposition: A | Discharge status: Home / Self Care | Payer: PPO | Source: Ambulatory Visit | Attending: Radiation Oncology | Admitting: Radiation Oncology

## 2019-07-06 ENCOUNTER — Other Ambulatory Visit: Payer: Self-pay

## 2019-07-06 DIAGNOSIS — Z510 Encounter for antineoplastic radiation therapy: Secondary | ICD-10-CM | POA: Diagnosis not present

## 2019-07-10 ENCOUNTER — Other Ambulatory Visit: Payer: Self-pay

## 2019-07-10 ENCOUNTER — Ambulatory Visit
Admission: RE | Admit: 2019-07-10 | Discharge: 2019-07-10 | Disposition: A | Discharge status: Home / Self Care | Payer: PPO | Source: Ambulatory Visit | Attending: Radiation Oncology | Admitting: Radiation Oncology

## 2019-07-10 DIAGNOSIS — Z510 Encounter for antineoplastic radiation therapy: Secondary | ICD-10-CM | POA: Diagnosis not present

## 2019-07-11 ENCOUNTER — Ambulatory Visit: Payer: PPO | Admitting: Radiation Oncology

## 2019-07-12 ENCOUNTER — Ambulatory Visit
Admission: RE | Admit: 2019-07-12 | Discharge: 2019-07-12 | Disposition: A | Discharge status: Home / Self Care | Payer: PPO | Source: Ambulatory Visit | Attending: Radiation Oncology | Admitting: Radiation Oncology

## 2019-07-12 ENCOUNTER — Other Ambulatory Visit: Payer: Self-pay

## 2019-07-12 DIAGNOSIS — Z510 Encounter for antineoplastic radiation therapy: Secondary | ICD-10-CM | POA: Diagnosis not present

## 2019-07-13 ENCOUNTER — Ambulatory Visit: Payer: PPO | Admitting: Radiation Oncology

## 2019-07-14 ENCOUNTER — Encounter: Payer: Self-pay | Admitting: Radiation Oncology

## 2019-07-14 ENCOUNTER — Other Ambulatory Visit: Payer: Self-pay

## 2019-07-14 ENCOUNTER — Ambulatory Visit
Admission: RE | Admit: 2019-07-14 | Discharge: 2019-07-14 | Disposition: A | Discharge status: Home / Self Care | Payer: PPO | Source: Ambulatory Visit | Attending: Radiation Oncology | Admitting: Radiation Oncology

## 2019-07-14 DIAGNOSIS — Z510 Encounter for antineoplastic radiation therapy: Secondary | ICD-10-CM | POA: Diagnosis not present

## 2019-07-14 DIAGNOSIS — C3412 Malignant neoplasm of upper lobe, left bronchus or lung: Secondary | ICD-10-CM | POA: Diagnosis not present

## 2019-07-27 ENCOUNTER — Telehealth: Payer: Self-pay | Admitting: Radiation Oncology

## 2019-07-27 DIAGNOSIS — C3411 Malignant neoplasm of upper lobe, right bronchus or lung: Secondary | ICD-10-CM

## 2019-07-27 NOTE — Telephone Encounter (Signed)
  Radiation Oncology         [phone]) [phone] ________________________________  Name: Chelsea Baker MRN: [id number]  Date of Service: 07/27/2019  DOB: Jul 29, 1945  Post Treatment Telephone Note  Diagnosis:  Putative Stage IA2, cT1bN0M0 NSCLC of the lingula  Interval Since Last Radiation:  2 weeks   07/04/19-07/14/19 SBRT Treatment: The lingular nodule was treated to 50 Gy in 5 fractions  04/06/18-04/13/18 SBRT Treatment: LUL tumor was treated to 54 Gy in 3 fractions  09/01/17-09/10/17 SBRT Treatment:  RUL tumor treated to 60 Gy in 5 fractions  Narrative:  The patient was contacted today for routine follow-up. During treatment she did very well with radiotherapy and did not have significant desquamation. She reports she is doing well without complaints of trouble with breathing or skin concerns.  Impression/Plan: 1.         Putative Stage IA2, cT1bN0M0 NSCLC of the lingula. The patient has done well since completing treatment. We will get another CT in about 6 weeks and if stable, recommend continued follow up imaging in 6 months time. She is also planning to follow up with Dr. [name]. 2.         Stage IB, cT2aN0M0, NSCLC, squamous cell carcinoma of the right upper lobe as well as a Putative Stage IA2, cT1bN0M0, NSCLC of the lingula extending into the LUL The patient's prior treatment sites remained stable and will continue to be followed with routine surveillance. 3.         RLL lesion. This was read as overall stable on her CT and PET. This will be followed expectantly.    Carola Rhine, PAC

## 2019-07-29 IMAGING — NM NM MISC PROCEDURE
9 series · 54 of 54 positions shown · non-contrast
Comparison: none

[Series 1: wbr rest · 6.40mm/px · 6 of 64 frames shown]
[frame 6/64]
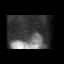
[frame 16/64]
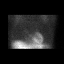
[frame 27/64]
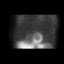
[frame 38/64]
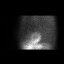
[frame 48/64]
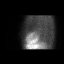
[frame 59/64]
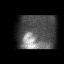

[Series 1: rest sax · 6.4mm · 6.40mm/px · 6 of 23 frames shown]
[frame 2/23]
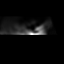
[frame 6/23]
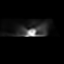
[frame 10/23]
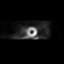
[frame 14/23]
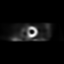
[frame 18/23]
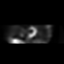
[frame 22/23]
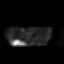

[Series 1: wbr_r-proj_st wbr rest · 6.40mm/px · 6 of 64 frames shown]
[frame 6/64]
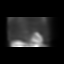
[frame 16/64]
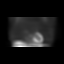
[frame 27/64]
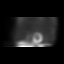
[frame 38/64]
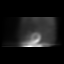
[frame 48/64]
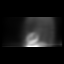
[frame 59/64]
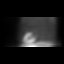

[Series 2: wbr stress-gsp · 6.40mm/px · 6 of 511 frames shown]
[frame 43/511  full-range]
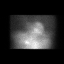
[frame 128/511  full-range]
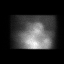
[frame 213/511  full-range]
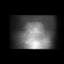
[frame 298/511  full-range]
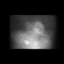
[frame 383/511  full-range]
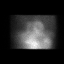
[frame 469/511  full-range]
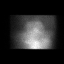

[Series 2: wbr_s-proj_st wbr stress-gsp · 6.40mm/px · 6 of 512 frames shown]
[frame 43/512]
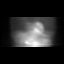
[frame 128/512]
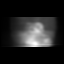
[frame 214/512]
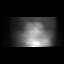
[frame 299/512]
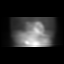
[frame 384/512]
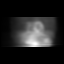
[frame 470/512]
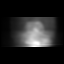

[Series 2: stress sax gs · 6.4mm · 6.40mm/px · 6 of 184 frames shown]
[frame 16/184]
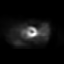
[frame 46/184]
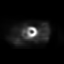
[frame 77/184]
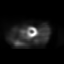
[frame 108/184]
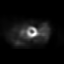
[frame 138/184]
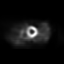
[frame 169/184]
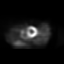

[Series 3: stress sax · 6.4mm · 6.40mm/px · 6 of 23 frames shown]
[frame 2/23]
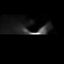
[frame 6/23]
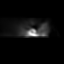
[frame 10/23]
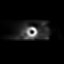
[frame 14/23]
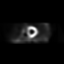
[frame 18/23]
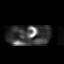
[frame 22/23]
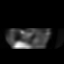

[Series 3: wbr stress-sum-em · 6.40mm/px · 6 of 64 frames shown]
[frame 6/64]
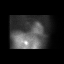
[frame 16/64]
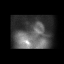
[frame 27/64]
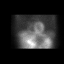
[frame 38/64]
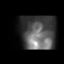
[frame 48/64]
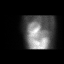
[frame 59/64]
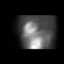

[Series 3: wbr_s-proj_st wbr stress-sum-em · 6.40mm/px · 6 of 64 frames shown]
[frame 6/64]
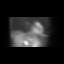
[frame 16/64]
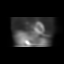
[frame 27/64]
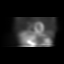
[frame 38/64]
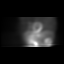
[frame 48/64]
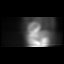
[frame 59/64]
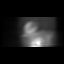

[54 of 54 positions shown; findings below may reference images not displayed]

Canned report from images found in remote index.

Refer to host system for actual result text.

## 2019-08-31 NOTE — Progress Notes (Signed)
  Radiation Oncology         [phone]) [phone] ________________________________  Name: SHRITA THIEN MRN: [id number]  Date: 07/14/2019  DOB: [birth year]  End of Treatment Note  Diagnosis:   Lung cancer  Indication for treatment::  curative       Radiation treatment dates:   07/04/19 - 07/14/19  Site/dose:   The patient was treated to the left lung with a course of stereotactic body radiation treatment.  The patient received 50 Gray in 5 fractions using a IMRT/SBRT technique, with 3 fields.  Narrative: The patient tolerated radiation treatment relatively well.   No unexpected difficulties.  The patient's breathing did not significantly change during the course of the treatment.  Plan: The patient has completed radiation treatment. The patient will return to radiation oncology clinic for routine followup in one month. I advised the patient to call or return sooner if they have any questions or concerns related to their recovery or treatment. ________________________________  Jodelle Gross, M.D., Ph.D.

## 2019-08-31 NOTE — Progress Notes (Signed)
  Radiation Oncology         [phone]) [phone] ________________________________  Name: SORAYA PAQUETTE MRN: [id number]  Date: 06/15/2019  DOB: September 26, 1945  RESPIRATORY MOTION MANAGEMENT SIMULATION  NARRATIVE:  In order to account for effect of respiratory motion on target structures and other organs in the planning and delivery of radiotherapy, this patient underwent respiratory motion management simulation.  To accomplish this, when the patient was brought to the CT simulation planning suite, 4D respiratoy motion management CT images were obtained.  The CT images were loaded into the planning software.  Then, using a variety of tools including Cine, MIP, and standard views, the target volume and planning target volumes (PTV) were delineated.  Avoidance structures were contoured.  Treatment planning then occurred.  Dose volume histograms were generated and reviewed for each of the requested structure.  The resulting plan was carefully reviewed and approved today.   ------------------------------------------------  Jodelle Gross, MD, PhD

## 2019-08-31 NOTE — Progress Notes (Signed)
[facility] Radiation Oncology Simulation and Treatment Planning Note   Name:  CARLOTA PHILLEY MRN: [id number]   Date: 06/15/2019  DOB: October 26, 1945  Status:outpatient    DIAGNOSIS:    ICD-10-CM   1. Malignant neoplasm of upper lobe of left lung ([place])  C34.12      CONSENT VERIFIED:yes   SET UP: Patient is setup supine   IMMOBILIZATION: The patient was immobilized using a customized Vac Loc bag/ blue bag and customized accuform device   NARRATIVE:The patient was brought to the [place].  Identity was confirmed.  All relevant records and images related to the planned course of therapy were reviewed.  Then, the patient was positioned in a stable reproducible clinical set-up for radiation therapy. Abdominal compression was applied by me.  4D CT images were obtained and reproducible breathing pattern was confirmed. Free breathing CT images were obtained.  Skin markings were placed.  The CT images were loaded into the planning software where the target and avoidance structures were contoured.  The radiation prescription was entered and confirmed.    TREATMENT PLANNING NOTE:  Treatment planning then occurred. I have requested : IMRT planning.This treatment technique is medically necessary due to the high-dose of radiation delivered to the target region which is in close proximity to adjacent critical normal structures.  3 dimensional simulation is performed and dose volume histogram of the gross tumor volume, planning tumor volume and criticial normal structures including the spinal cord and lungs were analyzed and requested.  Special treatment procedure was performed due to high dose per fraction.  The patient will be monitored for increased risk of toxicity.  Daily imaging using cone beam CT will be used for target localization.  I anticipate that the patient will receive 50 Gy in 5 fractions to target volume. Further adjustments will be made based  on the planning process is necessary.  ------------------------------------------------  Jodelle Gross, MD, PhD

## 2019-09-04 ENCOUNTER — Telehealth: Payer: Self-pay | Admitting: *Deleted

## 2019-09-04 NOTE — Telephone Encounter (Signed)
CALLED PATIENT TO INFORM OF STAT LABS ON 09-07-19 @ 1:45 PM @ [facility] AND HER CT TO FOLLOW, ARRIVAL TIME- 2:45 PM @ [facility], PATIENT TO HAVE WATER ONLY- 4 HRS. PRIOR TO TEST, PATIENT TO RECEIVE RESULTS FROM ALISON PERKINS ON 09-11-19 VIA TELEPHONE FOR RESULTS, LVM FOR A RETURN CALL

## 2019-09-07 ENCOUNTER — Other Ambulatory Visit: Payer: Self-pay

## 2019-09-07 ENCOUNTER — Ambulatory Visit: Payer: PPO | Admitting: Radiation Oncology

## 2019-09-07 ENCOUNTER — Ambulatory Visit
Admission: RE | Admit: 2019-09-07 | Discharge: 2019-09-07 | Disposition: A | Discharge status: Home / Self Care | Payer: PPO | Source: Ambulatory Visit | Attending: Radiation Oncology | Admitting: Radiation Oncology

## 2019-09-07 ENCOUNTER — Ambulatory Visit (HOSPITAL_COMMUNITY)
Admission: RE | Admit: 2019-09-07 | Discharge: 2019-09-07 | Disposition: A | Discharge status: Home / Self Care | Payer: PPO | Source: Ambulatory Visit | Attending: Radiation Oncology | Admitting: Radiation Oncology

## 2019-09-07 ENCOUNTER — Telehealth: Payer: Self-pay | Admitting: Radiation Oncology

## 2019-09-07 DIAGNOSIS — C3411 Malignant neoplasm of upper lobe, right bronchus or lung: Secondary | ICD-10-CM | POA: Diagnosis not present

## 2019-09-07 DIAGNOSIS — Z510 Encounter for antineoplastic radiation therapy: Secondary | ICD-10-CM | POA: Diagnosis not present

## 2019-09-07 DIAGNOSIS — C3412 Malignant neoplasm of upper lobe, left bronchus or lung: Secondary | ICD-10-CM | POA: Diagnosis not present

## 2019-09-07 LAB — BUN & CREATININE (CHCC)
BUN: 21 mg/dL (ref 8–23)
Creatinine: 1.16 mg/dL — ABNORMAL HIGH (ref 0.44–1.00)
GFR, Est AFR Am: 54 mL/min — ABNORMAL LOW (ref >60)
GFR, Estimated: 46 mL/min — ABNORMAL LOW (ref >60)

## 2019-09-07 MED ORDER — SODIUM CHLORIDE (PF) 0.9 % IJ SOLN
INTRAMUSCULAR | Status: AC
  Filled 2019-09-07: qty 50

## 2019-09-07 MED ORDER — IOHEXOL 300 MG/ML  SOLN
75.0000 mL | Freq: Once | INTRAMUSCULAR | Status: AC | PRN
  Administered 2019-09-07: 75 mL via INTRAVENOUS

## 2019-09-07 NOTE — Telephone Encounter (Addendum)
l called the patient and reviewed the favorable findings from her post treatment CT scan. She is scheduled to follow back up with Dr. [name] and we discussed that we would be happy to see her but since she's planning follow up with him we can see her as needed. She is aware of the recommendations for routine CT imaging of the chest every 6 months for surveillance for 5 years prior to annual low dose CT scans.

## 2019-09-08 ENCOUNTER — Telehealth: Payer: Self-pay | Admitting: Pulmonary Disease

## 2019-09-08 NOTE — Telephone Encounter (Signed)
Attempted to call pt but unable to reach. Left message for pt to return call. 

## 2019-09-11 ENCOUNTER — Ambulatory Visit: Payer: PPO | Admitting: Radiation Oncology

## 2019-09-13 NOTE — Telephone Encounter (Signed)
Patient last seen 7.16.20 by Dr. Ander Slade, recommended to follow up in 6 months  Patient is scheduled for appt with Dr Ander Slade on 3.25.21  [facility] TCB x2 for patient - need to verify which inhaler she needs (we have Duoneb, Pulmicort and Ventolin on file for her) and which pharmacy she uses.  Need to also advise on importance of keeping upcoming appt.

## 2019-09-14 NOTE — Telephone Encounter (Signed)
LMTCB x1 for pt.  

## 2019-09-14 NOTE — Telephone Encounter (Signed)
Pt returned call please call back

## 2019-09-19 MED ORDER — ALBUTEROL SULFATE HFA 108 (90 BASE) MCG/ACT IN AERS: 2.0000 | INHALATION_SPRAY | RESPIRATORY_TRACT | 0 refills | Status: DC | PRN

## 2019-09-19 NOTE — Telephone Encounter (Signed)
lmtcb for pt.  

## 2019-09-19 NOTE — Telephone Encounter (Signed)
Patient needs refill for Albuterol inhaler. Pharmacy is [facility] [address]. Patient phone number is [phone].

## 2019-09-19 NOTE — Telephone Encounter (Signed)
Spoke with pt. She is needing a refill on Albuterol HFA. Pt has a pending OV with Dr. Ander Slade on 09/21/2019. Nothing further was needed.

## 2019-09-21 ENCOUNTER — Other Ambulatory Visit: Payer: Self-pay

## 2019-09-21 ENCOUNTER — Encounter: Payer: Self-pay | Admitting: Pulmonary Disease

## 2019-09-21 ENCOUNTER — Ambulatory Visit: Payer: PPO | Admitting: Pulmonary Disease

## 2019-09-21 VITALS — BP 138/78 | HR 100 | Temp 97.3°F | Ht 62.0 in | Wt 141.2 lb

## 2019-09-21 DIAGNOSIS — J441 Chronic obstructive pulmonary disease with (acute) exacerbation: Secondary | ICD-10-CM | POA: Diagnosis not present

## 2019-09-21 MED ORDER — PREDNISONE 10 MG PO TABS: 10.0000 mg | ORAL_TABLET | Freq: Two times a day (BID) | ORAL | 0 refills | Status: DC

## 2019-09-21 NOTE — Progress Notes (Signed)
Chelsea Baker    [id number]    1945/10/30  Primary Care Physician:Danford, Berna Spare, NP  Referring Physician: Esaw Grandchild, NP [place],  [place] [id number]  Chief complaint:   Shortness of breath with activity   HPI:  History of chronic obstructive pulmonary disease Diagnosis squamous cell lung cancer in 2019 Did receive radiation treatments She did have another spot discovered for which she received more radiation treatment recently Being followed radiologically  No chest pain or discomfort Appetite is stable Weight is stable  Uses albuterol and a nebulizer Denies any other significant concerns at the present time She does have some cough, increased secretions lately  Pets: 3 dogs Occupation: Works in Scientist, research (medical) Exposures: Smoking history: Quit in 2019  Outpatient Encounter Medications as of 09/21/2019  Medication Sig  . acetaminophen (TYLENOL) 500 MG tablet Take 500 mg by mouth every 6 (six) hours as needed.  Marland Kitchen albuterol (VENTOLIN HFA) 108 (90 Base) MCG/ACT inhaler Inhale 2 puffs into the lungs every 4 (four) hours as needed for wheezing or shortness of breath.  . budesonide (PULMICORT) 0.5 MG/2ML nebulizer solution Take 2 mLs (0.5 mg total) by nebulization 2 (two) times daily.  Marland Kitchen ipratropium-albuterol (DUONEB) 0.5-2.5 (3) MG/3ML SOLN Take 3 mLs by nebulization every 4 (four) hours as needed.  . metoprolol succinate (TOPROL-XL) 50 MG 24 hr tablet TAKE 1 TABLET (50 MG TOTAL) BY MOUTH DAILY. TAKE WITH OR IMMEDIATELY FOLLOWING A MEAL.  . predniSONE (DELTASONE) 10 MG tablet Take 1 tablet (10 mg total) by mouth 2 (two) times daily with a meal.   No facility-administered encounter medications on file as of 09/21/2019.    Allergies as of 09/21/2019  . (No Known Allergies)    Past Medical History:  Diagnosis Date  . AAA (abdominal aortic aneurysm) ([place])   . Diverticulosis   . GERD (gastroesophageal reflux disease)   . Hiatal hernia   .  Hypertension   . Hypokalemia   . Lumbar spondylolysis   . Lung cancer ([place]) dx'd 2019  . Lung tumor   . On home O2    2L N/C    Past Surgical History:  Procedure Laterality Date  . ABDOMINAL AORTIC ANEURYSM REPAIR  10/01/10   endostent repair  . CATARACT EXTRACTION, BILATERAL    . LUNG BIOPSY    . NM MYOVIEW LTD  2012   Normal Lexiscan Myoview with no ischemia or infarction.  Normal LV function.  EF 67%.  . TONSILLECTOMY    . TRANSTHORACIC ECHOCARDIOGRAM  07/17/2017   EF 35-40%.  Septal mid and basal inferior wall hypokinesis.  Mildly dilated LV.  Mildly reduced EF.  GR 1 DD.  Calcified mitral annulus.     Family History  Problem Relation Age of Onset  . Heart failure Mother   . Heart attack Mother 12       In 60s - 1st MI  . Coronary artery disease Mother   . Coronary artery disease Father        Died at age 74  . Heart attack Father 45  . Heart disease Maternal Grandmother   . Heart disease Maternal Grandfather   . Cancer Neg Hx     Social History   Socioeconomic History  . Marital status: Single    Spouse name: Not on file  . Number of children: Not on file  . Years of education: Not on file  . Highest education level: Not on  file  Occupational History  . Not on file  Tobacco Use  . Smoking status: Former Smoker    Packs/day: 1.00    Years: 50.00    Pack years: 50.00    Types: Cigarettes    Quit date: 06/2017    Years since quitting: 2.2  . Smokeless tobacco: Never Used  Substance and Sexual Activity  . Alcohol use: Yes    Comment: rarely  . Drug use: No  . Sexual activity: Not Currently  Other Topics Concern  . Not on file  Social History Narrative  . Not on file   Social Determinants of Health   Financial Resource Strain:   . Difficulty of Paying Living Expenses:   Food Insecurity:   . Worried About Charity fundraiser in the Last Year:   . Arboriculturist in the Last Year:   Transportation Needs:   . Film/video editor (Medical):   Marland Kitchen  Lack of Transportation (Non-Medical):   Physical Activity:   . Days of Exercise per Week:   . Minutes of Exercise per Session:   Stress:   . Feeling of Stress :   Social Connections:   . Frequency of Communication with Friends and Family:   . Frequency of Social Gatherings with Friends and Family:   . Attends Religious Services:   . Active Member of Clubs or Organizations:   . Attends Archivist Meetings:   Marland Kitchen Marital Status:   Intimate Partner Violence:   . Fear of Current or Ex-Partner:   . Emotionally Abused:   Marland Kitchen Physically Abused:   . Sexually Abused:     Review of Systems  Constitutional: Negative for fatigue.  HENT: Negative.   Eyes: Negative.   Respiratory: Positive for cough and shortness of breath. Negative for apnea.   Cardiovascular: Negative.   Gastrointestinal: Negative.   Endocrine: Negative.   Genitourinary: Negative.   Musculoskeletal: Negative.     Vitals:   09/21/19 1123  BP: 138/78  Pulse: 100  Temp: (!) 97.3 F (36.3 C)  SpO2: 97%     Physical Exam  Constitutional: She is oriented to person, place, and time. She appears well-developed and well-nourished.  HENT:  Head: Normocephalic and atraumatic.  Eyes: Pupils are equal, round, and reactive to light.  Neck: No tracheal deviation present. No thyromegaly present.  Cardiovascular: Normal rate and regular rhythm.  Pulmonary/Chest: Effort normal and breath sounds normal. No respiratory distress. She has no wheezes.  Musculoskeletal:     Cervical back: Normal range of motion and neck supple.  Neurological: She is oriented to person, place, and time.   Data Reviewed: CT scan April 2019, September 2019, PET scan October 2019 reviewed with the patient-films reviewed CT scan of the chest performed July 2020-stable findings, new lesion left lung may be related to radiation changes-needs radiological follow-up  Assessment:  Chronic obstructive pulmonary disease -Moderately severe  obstructive lung disease on PFT -Shortness of breath with exertion -May have a slight exacerbation at the present time --We will give her a course of steroids  -History of lung cancer -Recently had more radiation treatments -Radiological follow-up continuing  .  She continues to follow with oncology as well -Encouraged to continue to stay very active  Reformed smoker  History of hypertension Lumbar spondylosis    Plan/Recommendations: Low  Continue nebulization treatments with DuoNeb and Pulmicort Continue albuterol MDI as needed  Radiological follow-up of new left nodule/scarring  See her back in the office in  about 6 months Encouraged to call with any significant concerns  Course of steroids   Sherrilyn Rist MD [facility] 09/21/2019, 4:03 PM  CC: Esaw Grandchild, NP

## 2019-09-21 NOTE — Patient Instructions (Signed)
I will see you back in about 6 months  Call with any significant concerns  Prednisone 10 p.o. twice daily for 14 days -Use as needed

## 2019-10-09 ENCOUNTER — Other Ambulatory Visit: Payer: Self-pay | Admitting: Pulmonary Disease

## 2019-10-09 DIAGNOSIS — Y998 Other external cause status: Secondary | ICD-10-CM | POA: Diagnosis not present

## 2019-10-09 DIAGNOSIS — Z85118 Personal history of other malignant neoplasm of bronchus and lung: Secondary | ICD-10-CM | POA: Diagnosis not present

## 2019-10-09 DIAGNOSIS — S60812A Abrasion of left wrist, initial encounter: Secondary | ICD-10-CM | POA: Diagnosis not present

## 2019-10-09 DIAGNOSIS — S61552A Open bite of left wrist, initial encounter: Secondary | ICD-10-CM | POA: Diagnosis not present

## 2019-10-09 DIAGNOSIS — W540XXA Bitten by dog, initial encounter: Secondary | ICD-10-CM | POA: Diagnosis not present

## 2019-10-09 DIAGNOSIS — Z23 Encounter for immunization: Secondary | ICD-10-CM | POA: Diagnosis not present

## 2019-10-09 DIAGNOSIS — Y93K9 Activity, other involving animal care: Secondary | ICD-10-CM | POA: Diagnosis not present

## 2019-10-09 DIAGNOSIS — I10 Essential (primary) hypertension: Secondary | ICD-10-CM | POA: Diagnosis not present

## 2019-11-17 ENCOUNTER — Other Ambulatory Visit: Payer: Self-pay

## 2019-11-17 DIAGNOSIS — C3411 Malignant neoplasm of upper lobe, right bronchus or lung: Secondary | ICD-10-CM

## 2019-11-20 ENCOUNTER — Inpatient Hospital Stay: Payer: PPO

## 2019-11-30 ENCOUNTER — Ambulatory Visit: Payer: PPO | Admitting: Hematology

## 2019-12-19 ENCOUNTER — Inpatient Hospital Stay: Payer: PPO | Attending: Hematology

## 2019-12-19 ENCOUNTER — Other Ambulatory Visit: Payer: Self-pay

## 2019-12-19 DIAGNOSIS — R0600 Dyspnea, unspecified: Secondary | ICD-10-CM | POA: Diagnosis not present

## 2019-12-19 DIAGNOSIS — C3411 Malignant neoplasm of upper lobe, right bronchus or lung: Secondary | ICD-10-CM

## 2019-12-19 DIAGNOSIS — Z85118 Personal history of other malignant neoplasm of bronchus and lung: Secondary | ICD-10-CM | POA: Diagnosis not present

## 2019-12-19 LAB — CMP (CANCER CENTER ONLY)
ALT: 17 U/L (ref 0–44)
AST: 23 U/L (ref 15–41)
Albumin: 3.9 g/dL (ref 3.5–5.0)
Alkaline Phosphatase: 81 U/L (ref 38–126)
Anion gap: 9 (ref 5–15)
BUN: 20 mg/dL (ref 8–23)
CO2: 27 mmol/L (ref 22–32)
Calcium: 9.8 mg/dL (ref 8.9–10.3)
Chloride: 101 mmol/L (ref 98–111)
Creatinine: 1.28 mg/dL — ABNORMAL HIGH (ref 0.44–1.00)
GFR, Est AFR Am: 48 mL/min — ABNORMAL LOW
GFR, Estimated: 41 mL/min — ABNORMAL LOW
Glucose, Bld: 113 mg/dL — ABNORMAL HIGH (ref 70–99)
Potassium: 4.7 mmol/L (ref 3.5–5.1)
Sodium: 137 mmol/L (ref 135–145)
Total Bilirubin: 0.3 mg/dL (ref 0.3–1.2)
Total Protein: 7.2 g/dL (ref 6.5–8.1)

## 2019-12-19 LAB — CBC WITH DIFFERENTIAL (CANCER CENTER ONLY)
Abs Immature Granulocytes: 0.01 10*3/uL (ref 0.00–0.07)
Basophils Absolute: 0 10*3/uL (ref 0.0–0.1)
Basophils Relative: 1 %
Eosinophils Absolute: 0.2 10*3/uL (ref 0.0–0.5)
Eosinophils Relative: 4 %
HCT: 41.1 % (ref 36.0–46.0)
Hemoglobin: 12.5 g/dL (ref 12.0–15.0)
Immature Granulocytes: 0 %
Lymphocytes Relative: 22 %
Lymphs Abs: 1.1 10*3/uL (ref 0.7–4.0)
MCH: 26.9 pg (ref 26.0–34.0)
MCHC: 30.4 g/dL (ref 30.0–36.0)
MCV: 88.4 fL (ref 80.0–100.0)
Monocytes Absolute: 0.5 10*3/uL (ref 0.1–1.0)
Monocytes Relative: 10 %
Neutro Abs: 3 10*3/uL (ref 1.7–7.7)
Neutrophils Relative %: 63 %
Platelet Count: 301 10*3/uL (ref 150–400)
RBC: 4.65 MIL/uL (ref 3.87–5.11)
RDW: 14.6 % (ref 11.5–15.5)
WBC Count: 4.8 10*3/uL (ref 4.0–10.5)
nRBC: 0 % (ref 0.0–0.2)

## 2019-12-25 NOTE — Progress Notes (Signed)
HEMATOLOGY/ONCOLOGY CLINIC NOTE  Date of Service: 12/25/2019  Patient Care Team: Esaw Grandchild, NP as PCP - General (Family Medicine)  CHIEF COMPLAINTS/PURPOSE OF CONSULTATION:   F/u for recurent squamous cell lung cancer   HISTORY OF PRESENTING ILLNESS:    Chelsea Baker is a wonderful 74 y.o. female who has been referred to [place] by Dr .Sloan Leiter, Henreitta Leber, MD  for evaluation and management of newly diagnosed Squmous cell carcinoma of the RUL.  Patient has a history of hypertension, nicotine dependence, GERD, hiatal hernia, AAA and presented with a 2-week history of cough, weight loss about 20 pounds in about 6 months and increasing shortness of breath over the last several days prior to admission.  In the ED the patient had a chest x-ray which showed a 3.4 cm Which showed a 3.4 cm right upper lobe mass which appeared cavitary.  Subsequent CT of the chest showed a 3.5 cm cavitary mass involving the right upper lobe concerning for a primary bronchogenic carcinoma.  She was also noted to have a 4 mm nodule which is indeterminate in the right lower lobe.  No evidence of metastatic disease noted in the chest or upper abdomen. No evidence of pulmonary embolism.  Patient subsequently had a CT-guided biopsy of the right upper lobe lung lesion on 07/15/2017.  Pathology revealed squamous cell carcinoma of the lung. She had other workup including an AFB smear which was negative.  Patient notes that she does not really have a primary care physician and has not had good medical follow-up and does not really see a doctor regularly. She has had a history of smoking 1/2-2 packs/day for 50 years.  No diagnosed COPD though likely has significant decline in lung function at baseline. Currently was short of breath despite lung cancer not involving a large part of the lung at this time and no evidence of PE.  Still needing 2-3 L of oxygen by nasal cannula currently and endorses shortness of  breath with minimal walking.  She notes that she lives with her son.   INTERVAL HISTORY   Chelsea Baker is here regarding her lung cancer. The patient's last visit with [place] was on 06/01/2019. The pt reports that she is doing well overall.  Pt completed Radiation between 07/04/19 and 07/14/19 with Dr. Lisbeth Renshaw. The pt reports that she has felt a bit fatigued and is slightly SOB, this has not changed much since the end of the RT. She is still undecided about receiving the COVID19 vaccine and believes that she contracted the virus in December of 2019. Pt has continued complete smoking cessation. Her Pulmonologist is currently managing her inhaler.   Of note since the patient's last visit, pt has had CT Chest ([id number]) completed on 09/07/2019 with results revealing "1. Recently treated lingular pulmonary nodule is mildly decreased in size. 2. Masslike fibrosis in the posterior right upper lobe is mildly increased, equivocal for continued evolution of postradiation change versus early recurrence. Suggest attention on follow-up chest CT in 3 months. PET-CT could be considered at this time as clinically warranted. 3. Otherwise no new or progressive metastatic disease in the chest."  Lab results today (12/19/19) of CBC w/diff and CMP is as follows: all values are WNL except for Glucose at 113, Creatinine at 1.28, GFR Est Non Af Am at 41.  On review of systems, pt reports fatigue, SOB and denies coughing and any other symptoms.   MEDICAL HISTORY:  Past Medical History:  Diagnosis Date  .  AAA (abdominal aortic aneurysm) ([place])   . Diverticulosis   . GERD (gastroesophageal reflux disease)   . Hiatal hernia   . Hypertension   . Hypokalemia   . Lumbar spondylolysis   . Lung cancer ([place]) dx'd 2019  . Lung tumor   . On home O2    2L N/C    SURGICAL HISTORY: Past Surgical History:  Procedure Laterality Date  . ABDOMINAL AORTIC ANEURYSM REPAIR  10/01/10   endostent repair  . CATARACT  EXTRACTION, BILATERAL    . LUNG BIOPSY    . NM MYOVIEW LTD  2012   Normal Lexiscan Myoview with no ischemia or infarction.  Normal LV function.  EF 67%.  . TONSILLECTOMY    . TRANSTHORACIC ECHOCARDIOGRAM  07/17/2017   EF 35-40%.  Septal mid and basal inferior wall hypokinesis.  Mildly dilated LV.  Mildly reduced EF.  GR 1 DD.  Calcified mitral annulus.     SOCIAL HISTORY: Social History   Socioeconomic History  . Marital status: Single    Spouse name: Not on file  . Number of children: Not on file  . Years of education: Not on file  . Highest education level: Not on file  Occupational History  . Not on file  Tobacco Use  . Smoking status: Former Smoker    Packs/day: 1.00    Years: 50.00    Pack years: 50.00    Types: Cigarettes    Quit date: 06/2017    Years since quitting: 2.4  . Smokeless tobacco: Never Used  Vaping Use  . Vaping Use: Never used  Substance and Sexual Activity  . Alcohol use: Yes    Comment: rarely  . Drug use: No  . Sexual activity: Not Currently  Other Topics Concern  . Not on file  Social History Narrative  . Not on file   Social Determinants of Health   Financial Resource Strain:   . Difficulty of Paying Living Expenses:   Food Insecurity:   . Worried About Charity fundraiser in the Last Year:   . Arboriculturist in the Last Year:   Transportation Needs:   . Film/video editor (Medical):   Marland Kitchen Lack of Transportation (Non-Medical):   Physical Activity:   . Days of Exercise per Week:   . Minutes of Exercise per Session:   Stress:   . Feeling of Stress :   Social Connections:   . Frequency of Communication with Friends and Family:   . Frequency of Social Gatherings with Friends and Family:   . Attends Religious Services:   . Active Member of Clubs or Organizations:   . Attends Archivist Meetings:   Marland Kitchen Marital Status:   Intimate Partner Violence:   . Fear of Current or Ex-Partner:   . Emotionally Abused:   Marland Kitchen Physically  Abused:   . Sexually Abused:     FAMILY HISTORY: Family History  Problem Relation Age of Onset  . Heart failure Mother   . Heart attack Mother 50       In 44s - 1st MI  . Coronary artery disease Mother   . Coronary artery disease Father        Died at age 34  . Heart attack Father 43  . Heart disease Maternal Grandmother   . Heart disease Maternal Grandfather   . Cancer Neg Hx     ALLERGIES:  has No Known Allergies.  MEDICATIONS:  Current Outpatient Medications  Medication Sig Dispense  Refill  . acetaminophen (TYLENOL) 500 MG tablet Take 500 mg by mouth every 6 (six) hours as needed.    Marland Kitchen albuterol (VENTOLIN HFA) 108 (90 Base) MCG/ACT inhaler INHALE 2 PUFFS INTO THE LUNGS EVERY 4 (FOUR) HOURS AS NEEDED FOR WHEEZING OR SHORTNESS OF BREATH. 18 g 5  . budesonide (PULMICORT) 0.5 MG/2ML nebulizer solution Take 2 mLs (0.5 mg total) by nebulization 2 (two) times daily. 120 mL 5  . ipratropium-albuterol (DUONEB) 0.5-2.5 (3) MG/3ML SOLN Take 3 mLs by nebulization every 4 (four) hours as needed. 90 mL 0  . metoprolol succinate (TOPROL-XL) 50 MG 24 hr tablet TAKE 1 TABLET (50 MG TOTAL) BY MOUTH DAILY. TAKE WITH OR IMMEDIATELY FOLLOWING A MEAL. 90 tablet 1  . predniSONE (DELTASONE) 10 MG tablet Take 1 tablet (10 mg total) by mouth 2 (two) times daily with a meal. 28 tablet 0   No current facility-administered medications for this visit.    REVIEW OF SYSTEMS:   A 10+ POINT REVIEW OF SYSTEMS WAS OBTAINED including neurology, dermatology, psychiatry, cardiac, respiratory, lymph, extremities, GI, GU, Musculoskeletal, constitutional, breasts, reproductive, HEENT.  All pertinent positives are noted in the HPI.  All others are negative.   PHYSICAL EXAMINATION: There were no vitals filed for this visit. Wt Readings from Last 3 Encounters:  09/21/19 141 lb 3.2 oz (64 kg)  01/17/19 127 lb 6.4 oz (57.8 kg)  01/12/19 126 lb 6.4 oz (57.3 kg)   There is no height or weight on file to calculate BMI.     GENERAL:alert, in no acute distress and comfortable SKIN: no acute rashes, no significant lesions EYES: conjunctiva are pink and non-injected, sclera anicteric OROPHARYNX: MMM, no exudates, no oropharyngeal erythema or ulceration NECK: supple, no JVD LYMPH:  no palpable lymphadenopathy in the cervical, axillary or inguinal regions LUNGS: clear to auscultation b/l with normal respiratory effort HEART: regular rate & rhythm ABDOMEN:  normoactive bowel sounds , non tender, not distended. No palpable hepatosplenomegaly.  Extremity: no pedal edema PSYCH: alert & oriented x 3 with fluent speech NEURO: no focal motor/sensory deficits  LABORATORY DATA:  I have reviewed the data as listed  . CBC Latest Ref Rng & Units 12/19/2019 05/16/2019 01/17/2019  WBC 4.0 - 10.5 K/uL 4.8 5.8 6.1  Hemoglobin 12.0 - 15.0 g/dL 12.5 12.7 12.6  Hematocrit 36 - 46 % 41.1 41.5 41.7  Platelets 150 - 400 K/uL 301 297 291    . CMP Latest Ref Rng & Units 12/19/2019 09/07/2019 05/16/2019  Glucose 70 - 99 mg/dL 113(H) - 89  BUN 8 - 23 mg/dL 20 21 24(H)  Creatinine 0.44 - 1.00 mg/dL 1.28(H) 1.16(H) 1.20(H)  Sodium 135 - 145 mmol/L 137 - 137  Potassium 3.5 - 5.1 mmol/L 4.7 - 4.7  Chloride 98 - 111 mmol/L 101 - 100  CO2 22 - 32 mmol/L 27 - 25  Calcium 8.9 - 10.3 mg/dL 9.8 - 9.5  Total Protein 6.5 - 8.1 g/dL 7.2 - 7.5  Total Bilirubin 0.3 - 1.2 mg/dL 0.3 - <0.2(L)  Alkaline Phos 38 - 126 U/L 81 - 76  AST 15 - 41 U/L 23 - 21  ALT 0 - 44 U/L 17 - 17    PATHOLOGY      RADIOGRAPHIC STUDIES: I have personally reviewed the radiological images as listed and agreed with the findings in the report. No results found.  ASSESSMENT & PLAN:   Chelsea Baker is a 74 y.o. female with   1) h/o right upper  lobe squamous cell carcinoma of the lung. Stage I Presenting with a cavitary RUL mass.  Biopsy proven to be squamous cell carcinoma. No overt mediastinal or hilar lymphadenopathy noted. PET/CT showed no overt  metastatic disease CT head neg for mets. Patient declined MRI S/p SBRT 60 Gy in 5 fractions 09/01/17 to 09/10/17  CT Chest 10/22/17 shows improvement in the right upper lobe mass after [facility]  03/01/18 CT Chest revealed Continued reduction in spiculated posterior right upper lobe pulmonary nodule. 2. Left upper lobe 1.6 cm spiculated pulmonary nodule is increased in size and density, compatible with malignancy, either a second primary bronchogenic carcinoma or contralateral metastasis. 3. No thoracic adenopathy.  03/18/18 PET/CT revealed Intense metabolic activity associated with enlarging LEFT upper lobe pulmonary nodule is most suggestive primary bronchogenic Carcinoma. 2. Mild to moderate activity associated treated pulmonary nodule in the RIGHT upper lobe with associated moderate metabolic activity in adjacent chest wall favored post radiation change inflammation. 3. Stable additional RIGHT lower lobe pulmonary nodule. 4. No new pulmonary nodules evident. 5. No evidence of metastatic mediastinal adenopathy or distant disease.  07/12/18 CT Chest which revealed 2.2 x 1.2 cm irregular/spiculated posterior right upper lobe nodule, grossly unchanged. 5 x 10 mm left upper lobe nodule, decreased. Aortic Atherosclerosis and Emphysema.  05/16/2019 CT Chest Wo Contrast (Accession [id number]) which revealed "Radiation changes in the posterior right upper lobe and left upper lobe/lingula.10 mm lingular nodule, progressive, suspicious for recurrence."  2) Now with Left upper lobe 1.6 cm spiculated pulmonary nodule- increasing in size. Seen on the 03/18/18 PET/CT as noted above. Newly identified Left enlarging nodule is suspected to be a new primary, and would be considered a new Stage I tumor S/p 54 Gy in 3 fractions 04/06/18 to 04/13/18  3) Dyspnea and rest and on minimal exertion. - much improved CT chest with no PE  significant COPD at baseline -  Needing 2-3 L of oxygen by nasal cannula in hospital. and is now  off oxygen ECHO ef 35-40% -Pulmonary function testing -severe obstruction - optimize rx -noted to have systolic cardiomyopathy - mx Dr Ellyn Hack. -Continue follow up with Dr. Sherrilyn Rist in Pulmonology   4) Poor medical f/u. Past Medical History:  Diagnosis Date  . AAA (abdominal aortic aneurysm) ([place])   . Diverticulosis   . GERD (gastroesophageal reflux disease)   . Hiatal hernia   . Hypertension   . Hypokalemia   . Lumbar spondylolysis   . Lung cancer ([place]) dx'd 2019  . Lung tumor   . On home O2    2L N/C   4) Protein calorie malnutrition -nutritional consultation  5) Heavy smoker 75-100 pk-yrs -Pt reported on 08/12/17 that she has completely quit smoking and plans to never smoke again.  -Pt has continued smoking cessation as of 03/08/18 and was encouraged to keep up her good work.   PLAN: -Discussed pt labwork today, 12/19/19; blood counts are nml, blood chemistries are stable  -Discussed 09/07/2019 CT Chest ([id number]) which revealed "1. Recently treated lingular pulmonary nodule is mildly decreased in size. 2. Masslike fibrosis in the posterior right upper lobe is mildly increased, equivocal for continued evolution of postradiation change versus early recurrence. Suggest attention on follow-up chest CT in 3 months. PET-CT could be considered at this time as clinically warranted. 3. Otherwise no new or progressive metastatic disease in the chest." -No lab or clinical evidence of recurrence of pt's Squamous Cell Carcinoma of the Lung at this time.  -Recommend pt receive the COVID19  vaccine ASAP. Advised pt that she is in a higher risk group due to her lung damage. Pt is unsure if she will receive it at this time.  -Will repeat CT Chest in 11 weeks -Will see back in 12 weeks with labs    FOLLOW UP: CT chest wo contrast in 11 weeks RTC with Dr [name] in 12 weeks   The total time spent in the appt was 20 minutes and more than 50% was on counseling and direct patient  cares.  All of the patient's questions were answered with apparent satisfaction. The patient knows to call the clinic with any problems, questions or concerns.    Sullivan Lone MD [place] AAHIVMS [facility] Hematology/Oncology Physician [facility]  (Office):       [phone] (Work cell):  [phone] (Fax):           [phone]  I, Yevette Edwards, am acting as a scribe for Dr. Sullivan Lone.   .I have reviewed the above documentation for accuracy and completeness, and I agree with the above. [name] MD

## 2019-12-26 ENCOUNTER — Inpatient Hospital Stay (HOSPITAL_BASED_OUTPATIENT_CLINIC_OR_DEPARTMENT_OTHER): Payer: PPO | Admitting: Hematology

## 2019-12-26 ENCOUNTER — Other Ambulatory Visit: Payer: Self-pay

## 2019-12-26 VITALS — BP 166/90 | HR 85 | Temp 97.7°F | Resp 18 | Ht 62.0 in | Wt 143.0 lb

## 2019-12-26 DIAGNOSIS — C3492 Malignant neoplasm of unspecified part of left bronchus or lung: Secondary | ICD-10-CM | POA: Diagnosis not present

## 2019-12-26 DIAGNOSIS — Z85118 Personal history of other malignant neoplasm of bronchus and lung: Secondary | ICD-10-CM | POA: Diagnosis not present

## 2020-01-04 ENCOUNTER — Telehealth: Payer: Self-pay | Admitting: Adult Health

## 2020-01-04 NOTE — Telephone Encounter (Signed)
Patient called to request Rx refill on :  (per pt Dr. Ellyn Hack only sees her (1x yearly) & says PCP should manage Rx refills.  Pt has been seeing Oncologist due to Lung ca  (pt 's LOV w/ [place] on 12/17/2018)   metoprolol succinate (TOPROL-XL) 50 MG 24 hr tablet [[id number]] ENDED  Order Details Dose: 50 mg Route: Oral Frequency: Daily  Dispense Quantity: 90 tablet Refills: 1       Sig: TAKE 1 TABLET (50 MG TOTAL) BY MOUTH DAILY. TAKE WITH OR IMMEDIATELY FOLLOWING A MEAL.      Start Date: 11/22/18 End Date: 02/20/19 after 90 doses  Written Date: 11/22/18 Expiration Date: 11/22/19  Original Order:  metoprolol succinate (TOPROL-XL) 50 MG 24 hr tablet [[id number]]  Providers  Ordering and Authorizing Provider: Leonie Man, MD NPI: [id number]      --Forwarding refill request to med asst for review w/ provider---If any questions pls contact pt @ [phone].  --Dion Body

## 2020-01-05 MED ORDER — METOPROLOL SUCCINATE ER 50 MG PO TB24: 50.0000 mg | ORAL_TABLET | Freq: Every day | ORAL | 0 refills | Status: DC

## 2020-01-05 NOTE — Telephone Encounter (Signed)
Spoke with Herb Grays who states she is willing to give 30 day supply of medication for patient but that patient needs to follow up with Cardiology 1 time a year.    Refill sent to requested pharmacy-patient is aware of above information and verbalized understanding. AS, CMA

## 2020-01-05 NOTE — Telephone Encounter (Signed)
Metoprolol not given by [place]. Medication originally prescribed by Cardiology-then by old PCP. Patient is requesting refill of this medication.   Patient has apt scheduled with you 01/2020. Please review and advise. AS, CMA

## 2020-01-29 ENCOUNTER — Ambulatory Visit: Payer: PPO | Admitting: Physician Assistant

## 2020-01-29 ENCOUNTER — Other Ambulatory Visit: Payer: Self-pay | Admitting: Physician Assistant

## 2020-01-29 ENCOUNTER — Ambulatory Visit (INDEPENDENT_AMBULATORY_CARE_PROVIDER_SITE_OTHER): Payer: PPO | Admitting: Physician Assistant

## 2020-01-29 ENCOUNTER — Other Ambulatory Visit: Payer: Self-pay

## 2020-01-29 ENCOUNTER — Encounter: Payer: Self-pay | Admitting: Physician Assistant

## 2020-01-29 VITALS — BP 157/83 | HR 90 | Temp 97.6°F | Ht 59.84 in | Wt 144.0 lb

## 2020-01-29 DIAGNOSIS — I420 Dilated cardiomyopathy: Secondary | ICD-10-CM

## 2020-01-29 DIAGNOSIS — E785 Hyperlipidemia, unspecified: Secondary | ICD-10-CM

## 2020-01-29 DIAGNOSIS — M62838 Other muscle spasm: Secondary | ICD-10-CM | POA: Diagnosis not present

## 2020-01-29 DIAGNOSIS — I159 Secondary hypertension, unspecified: Secondary | ICD-10-CM

## 2020-01-29 MED ORDER — BACLOFEN 5 MG PO TABS: 1.0000 | ORAL_TABLET | Freq: Two times a day (BID) | ORAL | 0 refills | Status: AC | PRN

## 2020-01-29 MED ORDER — METOPROLOL SUCCINATE ER 50 MG PO TB24: 50.0000 mg | ORAL_TABLET | Freq: Every day | ORAL | 0 refills | Status: DC

## 2020-01-29 NOTE — Assessment & Plan Note (Signed)
-  Advised to contact Cardiology and verify if she needs to follow-up.  -Last echocardiogram 2019: Revealed EF 35-40% with mildly dilated LV.

## 2020-01-29 NOTE — Progress Notes (Signed)
Established Patient Office Visit  Subjective:  Patient ID: Chelsea Baker, female    DOB: [birth year]  Age: 74 y.o. MRN: [id number]  CC:  Chief Complaint  Patient presents with  . Hypertension    HPI [place] presents for follow-up on hypertension. Pt denies new onset chest pain, palpitations, dizziness, or lower extremity swelling. Reports she feels good and doesn't need to check BP at home. Taking medication without issues. Has complaints of muscle spasms, mostly on her left side especially when coughing. She has had two falls this year which were accidental. States one occurred when going the steps fast and tripped. She continues to follow-up with pulmonology and oncology. Reports she hasn't seen cardiology because she was told she didn't need to at last office visit with them.  Past Medical History:  Diagnosis Date  . AAA (abdominal aortic aneurysm) ([place])   . Diverticulosis   . GERD (gastroesophageal reflux disease)   . Hiatal hernia   . Hypertension   . Hypokalemia   . Lumbar spondylolysis   . Lung cancer ([place]) dx'd 2019  . Lung tumor   . On home O2    2L N/C    Past Surgical History:  Procedure Laterality Date  . ABDOMINAL AORTIC ANEURYSM REPAIR  10/01/10   endostent repair  . CATARACT EXTRACTION, BILATERAL    . LUNG BIOPSY    . NM MYOVIEW LTD  2012   Normal Lexiscan Myoview with no ischemia or infarction.  Normal LV function.  EF 67%.  . TONSILLECTOMY    . TRANSTHORACIC ECHOCARDIOGRAM  07/17/2017   EF 35-40%.  Septal mid and basal inferior wall hypokinesis.  Mildly dilated LV.  Mildly reduced EF.  GR 1 DD.  Calcified mitral annulus.     Family History  Problem Relation Age of Onset  . Heart failure Mother   . Heart attack Mother 67       In 36s - 1st MI  . Coronary artery disease Mother   . Coronary artery disease Father        Died at age 45  . Heart attack Father 17  . Heart disease Maternal Grandmother   . Heart disease Maternal Grandfather    . Cancer Neg Hx     Social History   Socioeconomic History  . Marital status: Single    Spouse name: Not on file  . Number of children: Not on file  . Years of education: Not on file  . Highest education level: Not on file  Occupational History  . Not on file  Tobacco Use  . Smoking status: Former Smoker    Packs/day: 1.00    Years: 50.00    Pack years: 50.00    Types: Cigarettes    Quit date: 06/2017    Years since quitting: 2.5  . Smokeless tobacco: Never Used  Vaping Use  . Vaping Use: Never used  Substance and Sexual Activity  . Alcohol use: Yes    Comment: rarely  . Drug use: No  . Sexual activity: Not Currently  Other Topics Concern  . Not on file  Social History Narrative  . Not on file   Social Determinants of Health   Financial Resource Strain:   . Difficulty of Paying Living Expenses:   Food Insecurity:   . Worried About Charity fundraiser in the Last Year:   . Arboriculturist in the Last Year:   Transportation Needs:   . Lack of Transportation (  Medical):   Marland Kitchen Lack of Transportation (Non-Medical):   Physical Activity:   . Days of Exercise per Week:   . Minutes of Exercise per Session:   Stress:   . Feeling of Stress :   Social Connections:   . Frequency of Communication with Friends and Family:   . Frequency of Social Gatherings with Friends and Family:   . Attends Religious Services:   . Active Member of Clubs or Organizations:   . Attends Archivist Meetings:   Marland Kitchen Marital Status:   Intimate Partner Violence:   . Fear of Current or Ex-Partner:   . Emotionally Abused:   Marland Kitchen Physically Abused:   . Sexually Abused:     Outpatient Medications Prior to Visit  Medication Sig Dispense Refill  . acetaminophen (TYLENOL) 500 MG tablet Take 500 mg by mouth every 6 (six) hours as needed.    Marland Kitchen albuterol (VENTOLIN HFA) 108 (90 Base) MCG/ACT inhaler INHALE 2 PUFFS INTO THE LUNGS EVERY 4 (FOUR) HOURS AS NEEDED FOR WHEEZING OR SHORTNESS OF BREATH.  18 g 5  . budesonide (PULMICORT) 0.5 MG/2ML nebulizer solution Take 2 mLs (0.5 mg total) by nebulization 2 (two) times daily. 120 mL 5  . ipratropium-albuterol (DUONEB) 0.5-2.5 (3) MG/3ML SOLN Take 3 mLs by nebulization every 4 (four) hours as needed. 90 mL 0  . predniSONE (DELTASONE) 10 MG tablet Take 1 tablet (10 mg total) by mouth 2 (two) times daily with a meal. 28 tablet 0  . metoprolol succinate (TOPROL-XL) 50 MG 24 hr tablet Take 1 tablet (50 mg total) by mouth daily. Take with or immediately following a meal.**PATIENT WILL NEED TO FOLLOW UP WITH CARDIOLOGY FOR FURTHER REFILLS** 30 tablet 0   No facility-administered medications prior to visit.    No Known Allergies  ROS Review of Systems Review of Systems:  A fourteen system review of systems was performed and found to be positive as per HPI.  Objective:    Physical Exam General: Well nourished, in no apparent distress. Eyes: PERRLA, EOMs, conjunctiva clr Resp: Respiratory effort- normal, ECTA B/L  Cardio: RRR S1 S2 Abdomen: no gross distention. Lymphatics:  less 2 sec cap RF, no gross edema M-sk: Good ROM, 5/5 strength Skin: Warm, dry  Neuro: Alert, Oriented Psych: Normal affect, Insight and Judgment appropriate.   BP (!) 157/83   Pulse 90   Temp 97.6 F (36.4 C) (Oral)   Ht 4' 11.84" (1.52 m)   Wt 144 lb (65.3 kg)   SpO2 90%   BMI 28.27 kg/m  Wt Readings from Last 3 Encounters:  01/29/20 144 lb (65.3 kg)  12/26/19 143 lb (64.9 kg)  09/21/19 141 lb 3.2 oz (64 kg)     Health Maintenance Due  Topic Date Due  . MAMMOGRAM  Never done  . COLONOSCOPY  Never done  . DEXA SCAN  Never done  . PNA vac Low Risk Adult (1 of 2 - PCV13) Never done    There are no preventive care reminders to display for this patient.  Lab Results  Component Value Date   TSH 1.700 12/01/2018   Lab Results  Component Value Date   WBC 4.8 12/19/2019   HGB 12.5 12/19/2019   HCT 41.1 12/19/2019   MCV 88.4 12/19/2019   PLT 301  12/19/2019   Lab Results  Component Value Date   NA 137 12/19/2019   K 4.7 12/19/2019   CO2 27 12/19/2019   GLUCOSE 113 (H) 12/19/2019   BUN 20  12/19/2019   CREATININE 1.28 (H) 12/19/2019   BILITOT 0.3 12/19/2019   ALKPHOS 81 12/19/2019   AST 23 12/19/2019   ALT 17 12/19/2019   PROT 7.2 12/19/2019   ALBUMIN 3.9 12/19/2019   CALCIUM 9.8 12/19/2019   ANIONGAP 9 12/19/2019   Lab Results  Component Value Date   CHOL 249 (H) 12/01/2018   Lab Results  Component Value Date   HDL 49 12/01/2018   Lab Results  Component Value Date   LDLCALC 149 (H) 12/01/2018   Lab Results  Component Value Date   TRIG 253 (H) 12/01/2018   Lab Results  Component Value Date   CHOLHDL 5.1 (H) 12/01/2018   Lab Results  Component Value Date   HGBA1C 5.9 (H) 12/01/2018      Assessment & Plan:   Problem List Items Addressed This Visit      Cardiovascular and Mediastinum   Dilated cardiomyopathy ([place]) (Chronic)    -Advised to contact Cardiology and verify if she needs to follow-up.  -Last echocardiogram 2019: Revealed EF 35-40% with mildly dilated LV.           Relevant Medications   metoprolol succinate (TOPROL-XL) 50 MG 24 hr tablet   Hypertension (Chronic)    - BP today is 163/82 HR 90, BP recheck 157/83- improved, stable - Continue Metoprolol. - Recommend ambulatory BP and pulse monitoring, and notify the clinic if BP consistently above 140/90. - Follow DASH diet. - Encourage to stay as active as possible.       Relevant Medications   metoprolol succinate (TOPROL-XL) 50 MG 24 hr tablet     Other   Hyperlipidemia    - Last lipid panel elevated. Due for repeat blood work. - Recommend to follow a heart healthy diet. - Return for fasting blood work in the next few weeks.        Relevant Medications   metoprolol succinate (TOPROL-XL) 50 MG 24 hr tablet    Other Visit Diagnoses    Muscle spasticity    -  Primary   Relevant Medications   Baclofen 5 MG TABS      Muscle spasms/spasticity: -Apply heat and use baclofen as needed   Meds ordered this encounter  Medications  . Baclofen 5 MG TABS    Sig: Take 1 tablet by mouth 2 (two) times daily as needed.    Dispense:  60 tablet    Refill:  0    Order Specific Question:   Supervising Provider    Answer:   Beatrice Lecher D [[id number]]  . metoprolol succinate (TOPROL-XL) 50 MG 24 hr tablet    Sig: Take 1 tablet (50 mg total) by mouth daily. Take with or immediately following a meal.**PATIENT WILL NEED TO FOLLOW UP WITH CARDIOLOGY FOR FURTHER REFILLS**    Dispense:  90 tablet    Refill:  0    Order Specific Question:   Supervising Provider    Answer:   Beatrice Lecher D [[id number]]    Follow-up: Return in about 4 months (around 05/30/2020) for [facility]; lab visit in 1-5 weeks for FBW.    Lorrene Reid, PA-C

## 2020-01-29 NOTE — Assessment & Plan Note (Signed)
-   Last lipid panel elevated. Due for repeat blood work. - Recommend to follow a heart healthy diet. - Return for fasting blood work in the next few weeks.

## 2020-01-29 NOTE — Patient Instructions (Signed)
DASH Eating Plan DASH stands for "Dietary Approaches to Stop Hypertension." The DASH eating plan is a healthy eating plan that has been shown to reduce high blood pressure (hypertension). It may also reduce your risk for type 2 diabetes, heart disease, and stroke. The DASH eating plan may also help with weight loss. What are tips for following this plan?  General guidelines  Avoid eating more than 2,300 mg (milligrams) of salt (sodium) a day. If you have hypertension, you may need to reduce your sodium intake to 1,500 mg a day.  Limit alcohol intake to no more than 1 drink a day for nonpregnant women and 2 drinks a day for men. One drink equals 12 oz of beer, 5 oz of wine, or 1 oz of hard liquor.  Work with your health care provider to maintain a healthy body weight or to lose weight. Ask what an ideal weight is for you.  Get at least 30 minutes of exercise that causes your heart to beat faster (aerobic exercise) most days of the week. Activities may include walking, swimming, or biking.  Work with your health care provider or diet and nutrition specialist (dietitian) to adjust your eating plan to your individual calorie needs. Reading food labels   Check food labels for the amount of sodium per serving. Choose foods with less than 5 percent of the Daily Value of sodium. Generally, foods with less than 300 mg of sodium per serving fit into this eating plan.  To find whole grains, look for the word "whole" as the first word in the ingredient list. Shopping  Buy products labeled as "low-sodium" or "no salt added."  Buy fresh foods. Avoid canned foods and premade or frozen meals. Cooking  Avoid adding salt when cooking. Use salt-free seasonings or herbs instead of table salt or sea salt. Check with your health care provider or pharmacist before using salt substitutes.  Do not fry foods. Cook foods using healthy methods such as baking, boiling, grilling, and broiling instead.  Cook with  heart-healthy oils, such as olive, canola, soybean, or sunflower oil. Meal planning  Eat a balanced diet that includes: ? 5 or more servings of fruits and vegetables each day. At each meal, try to fill half of your plate with fruits and vegetables. ? Up to 6-8 servings of whole grains each day. ? Less than 6 oz of lean meat, poultry, or fish each day. A 3-oz serving of meat is about the same size as a deck of cards. One egg equals 1 oz. ? 2 servings of low-fat dairy each day. ? A serving of nuts, seeds, or beans 5 times each week. ? Heart-healthy fats. Healthy fats called Omega-3 fatty acids are found in foods such as flaxseeds and coldwater fish, like sardines, salmon, and mackerel.  Limit how much you eat of the following: ? Canned or prepackaged foods. ? Food that is high in trans fat, such as fried foods. ? Food that is high in saturated fat, such as fatty meat. ? Sweets, desserts, sugary drinks, and other foods with added sugar. ? Full-fat dairy products.  Do not salt foods before eating.  Try to eat at least 2 vegetarian meals each week.  Eat more home-cooked food and less restaurant, buffet, and fast food.  When eating at a restaurant, ask that your food be prepared with less salt or no salt, if possible. What foods are recommended? The items listed may not be a complete list. Talk with your dietitian about   what dietary choices are best for you. Grains Whole-grain or whole-wheat bread. Whole-grain or whole-wheat pasta. Brown rice. Oatmeal. Quinoa. Bulgur. Whole-grain and low-sodium cereals. Pita bread. Low-fat, low-sodium crackers. Whole-wheat flour tortillas. Vegetables Fresh or frozen vegetables (raw, steamed, roasted, or grilled). Low-sodium or reduced-sodium tomato and vegetable juice. Low-sodium or reduced-sodium tomato sauce and tomato paste. Low-sodium or reduced-sodium canned vegetables. Fruits All fresh, dried, or frozen fruit. Canned fruit in natural juice (without  added sugar). Meat and other protein foods Skinless chicken or turkey. Ground chicken or turkey. Pork with fat trimmed off. Fish and seafood. Egg whites. Dried beans, peas, or lentils. Unsalted nuts, nut butters, and seeds. Unsalted canned beans. Lean cuts of beef with fat trimmed off. Low-sodium, lean deli meat. Dairy Low-fat (1%) or fat-free (skim) milk. Fat-free, low-fat, or reduced-fat cheeses. Nonfat, low-sodium ricotta or cottage cheese. Low-fat or nonfat yogurt. Low-fat, low-sodium cheese. Fats and oils Soft margarine without trans fats. Vegetable oil. Low-fat, reduced-fat, or light mayonnaise and salad dressings (reduced-sodium). Canola, safflower, olive, soybean, and sunflower oils. Avocado. Seasoning and other foods Herbs. Spices. Seasoning mixes without salt. Unsalted popcorn and pretzels. Fat-free sweets. What foods are not recommended? The items listed may not be a complete list. Talk with your dietitian about what dietary choices are best for you. Grains Baked goods made with fat, such as croissants, muffins, or some breads. Dry pasta or rice meal packs. Vegetables Creamed or fried vegetables. Vegetables in a cheese sauce. Regular canned vegetables (not low-sodium or reduced-sodium). Regular canned tomato sauce and paste (not low-sodium or reduced-sodium). Regular tomato and vegetable juice (not low-sodium or reduced-sodium). Pickles. Olives. Fruits Canned fruit in a light or heavy syrup. Fried fruit. Fruit in cream or butter sauce. Meat and other protein foods Fatty cuts of meat. Ribs. Fried meat. Bacon. Sausage. Bologna and other processed lunch meats. Salami. Fatback. Hotdogs. Bratwurst. Salted nuts and seeds. Canned beans with added salt. Canned or smoked fish. Whole eggs or egg yolks. Chicken or turkey with skin. Dairy Whole or 2% milk, cream, and half-and-half. Whole or full-fat cream cheese. Whole-fat or sweetened yogurt. Full-fat cheese. Nondairy creamers. Whipped toppings.  Processed cheese and cheese spreads. Fats and oils Butter. Stick margarine. Lard. Shortening. Ghee. Bacon fat. Tropical oils, such as coconut, palm kernel, or palm oil. Seasoning and other foods Salted popcorn and pretzels. Onion salt, garlic salt, seasoned salt, table salt, and sea salt. Worcestershire sauce. Tartar sauce. Barbecue sauce. Teriyaki sauce. Soy sauce, including reduced-sodium. Steak sauce. Canned and packaged gravies. Fish sauce. Oyster sauce. Cocktail sauce. Horseradish that you find on the shelf. Ketchup. Mustard. Meat flavorings and tenderizers. Bouillon cubes. Hot sauce and Tabasco sauce. Premade or packaged marinades. Premade or packaged taco seasonings. Relishes. Regular salad dressings. Where to find more information:  National Heart, Lung, and Blood Institute: www.nhlbi.nih.gov  American Heart Association: www.heart.org Summary  The DASH eating plan is a healthy eating plan that has been shown to reduce high blood pressure (hypertension). It may also reduce your risk for type 2 diabetes, heart disease, and stroke.  With the DASH eating plan, you should limit salt (sodium) intake to 2,300 mg a day. If you have hypertension, you may need to reduce your sodium intake to 1,500 mg a day.  When on the DASH eating plan, aim to eat more fresh fruits and vegetables, whole grains, lean proteins, low-fat dairy, and heart-healthy fats.  Work with your health care provider or diet and nutrition specialist (dietitian) to adjust your eating plan to your   individual calorie needs. This information is not intended to replace advice given to you by your health care provider. Make sure you discuss any questions you have with your health care provider. Document Revised: 05/28/2017 Document Reviewed: 06/08/2016 Elsevier Patient Education  2020 [employer]  

## 2020-01-29 NOTE — Assessment & Plan Note (Addendum)
-   BP today is 163/82 HR 90, BP recheck 157/83- improved, stable - Continue Metoprolol. - Recommend ambulatory BP and pulse monitoring, and notify the clinic if BP consistently above 140/90. - Follow DASH diet. - Encourage to stay as active as possible.

## 2020-03-11 ENCOUNTER — Ambulatory Visit (HOSPITAL_COMMUNITY)
Admission: RE | Admit: 2020-03-11 | Discharge: 2020-03-11 | Disposition: A | Discharge status: Home / Self Care | Payer: PPO | Source: Ambulatory Visit | Attending: Hematology | Admitting: Hematology

## 2020-03-11 ENCOUNTER — Other Ambulatory Visit: Payer: Self-pay

## 2020-03-11 DIAGNOSIS — C3492 Malignant neoplasm of unspecified part of left bronchus or lung: Secondary | ICD-10-CM | POA: Insufficient documentation

## 2020-03-11 DIAGNOSIS — C3490 Malignant neoplasm of unspecified part of unspecified bronchus or lung: Secondary | ICD-10-CM | POA: Diagnosis not present

## 2020-03-11 DIAGNOSIS — I700 Atherosclerosis of aorta: Secondary | ICD-10-CM | POA: Diagnosis not present

## 2020-03-11 DIAGNOSIS — J181 Lobar pneumonia, unspecified organism: Secondary | ICD-10-CM | POA: Diagnosis not present

## 2020-03-11 DIAGNOSIS — I2510 Atherosclerotic heart disease of native coronary artery without angina pectoris: Secondary | ICD-10-CM | POA: Diagnosis not present

## 2020-03-18 ENCOUNTER — Inpatient Hospital Stay: Payer: PPO | Attending: Hematology | Admitting: Hematology

## 2020-03-18 ENCOUNTER — Other Ambulatory Visit: Payer: Self-pay

## 2020-03-18 VITALS — BP 151/92 | HR 95 | Temp 96.9°F | Resp 18 | Ht 59.0 in | Wt 142.7 lb

## 2020-03-18 DIAGNOSIS — C3492 Malignant neoplasm of unspecified part of left bronchus or lung: Secondary | ICD-10-CM | POA: Diagnosis not present

## 2020-03-18 DIAGNOSIS — Z85118 Personal history of other malignant neoplasm of bronchus and lung: Secondary | ICD-10-CM | POA: Insufficient documentation

## 2020-03-18 DIAGNOSIS — C3411 Malignant neoplasm of upper lobe, right bronchus or lung: Secondary | ICD-10-CM

## 2020-03-18 MED ORDER — PREDNISONE 20 MG PO TABS: 20.0000 mg | ORAL_TABLET | Freq: Every day | ORAL | 0 refills | Status: AC

## 2020-03-18 MED ORDER — FEXOFENADINE HCL 180 MG PO TABS: 180.0000 mg | ORAL_TABLET | Freq: Every day | ORAL | 1 refills | Status: DC

## 2020-03-18 NOTE — Progress Notes (Signed)
HEMATOLOGY/ONCOLOGY CLINIC NOTE  Date of Service: 03/18/2020  Patient Care Team: Lorrene Reid, PA-C as PCP - General (Physician Assistant)  CHIEF COMPLAINTS/PURPOSE OF CONSULTATION:   F/u for recurent squamous cell lung cancer   HISTORY OF PRESENTING ILLNESS:    Chelsea Baker is a wonderful 74 y.o. female who has been referred to [place] by Dr .Sloan Leiter, Henreitta Leber, MD  for evaluation and management of newly diagnosed Squmous cell carcinoma of the RUL.  Patient has a history of hypertension, nicotine dependence, GERD, hiatal hernia, AAA and presented with a 2-week history of cough, weight loss about 20 pounds in about 6 months and increasing shortness of breath over the last several days prior to admission.  In the ED the patient had a chest x-ray which showed a 3.4 cm Which showed a 3.4 cm right upper lobe mass which appeared cavitary.  Subsequent CT of the chest showed a 3.5 cm cavitary mass involving the right upper lobe concerning for a primary bronchogenic carcinoma.  She was also noted to have a 4 mm nodule which is indeterminate in the right lower lobe.  No evidence of metastatic disease noted in the chest or upper abdomen. No evidence of pulmonary embolism.  Patient subsequently had a CT-guided biopsy of the right upper lobe lung lesion on 07/15/2017.  Pathology revealed squamous cell carcinoma of the lung. She had other workup including an AFB smear which was negative.  Patient notes that she does not really have a primary care physician and has not had good medical follow-up and does not really see a doctor regularly. She has had a history of smoking 1/2-2 packs/day for 50 years.  No diagnosed COPD though likely has significant decline in lung function at baseline. Currently was short of breath despite lung cancer not involving a large part of the lung at this time and no evidence of PE.  Still needing 2-3 L of oxygen by nasal cannula currently and endorses shortness of  breath with minimal walking.  She notes that she lives with her son.   INTERVAL HISTORY: Chelsea Baker is here regarding her lung cancer. The patient's last visit with [place] was on 12/26/2019. The pt reports that she is doing well overall.  The pt denies any increased shortness of breath, but she has had to use her inhaler regularly due to allergies. Pt uses Flonase and Benadryl at night, but uses nothing during the day. She continues complete smoking cessation. She has occasionally experienced acid reflux, which resolved with TUMS.   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 Pt has not received her COVID19 vaccines.   Of note since the patient's last visit, pt has had CT Chest ([id number]) completed on 03/11/2020 with results revealing "1. Evolving changes of radiation therapy in the lingula. AP window adenopathy and suspected left hilar adenopathy are highly suspicious for disease progression. 2. 3 mm left upper lobe nodule is new. Continued attention on follow-up exams is warranted. 3. Post radiation scarring in the posterior right hemithorax. 4. Aortic atherosclerosis (ICD10-I70.0). Coronary artery calcification."  On review of systems, pt reports rhinorrhea and denies worsening cough, productive cough, worsening SOB, leg swelling, abdominal pain, low appetite, dysphagia, acid reflux and any other symptoms.   MEDICAL HISTORY:  Past Medical History:  Diagnosis Date   AAA (abdominal aortic aneurysm) (HCC)    Diverticulosis    GERD (gastroesophageal reflux disease)    Hiatal hernia    Hypertension  Hypokalemia    Lumbar spondylolysis    Lung cancer ([place]) dx'd 2019   Lung tumor    On home O2    2L N/C    SURGICAL HISTORY: Past Surgical History:  Procedure Laterality Date   ABDOMINAL AORTIC ANEURYSM REPAIR  10/01/10   endostent repair   CATARACT EXTRACTION, BILATERAL     LUNG BIOPSY     NM MYOVIEW LTD  2012   Normal Lexiscan  Myoview with no ischemia or infarction.  Normal LV function.  EF 67%.   TONSILLECTOMY     TRANSTHORACIC ECHOCARDIOGRAM  07/17/2017   EF 35-40%.  Septal mid and basal inferior wall hypokinesis.  Mildly dilated LV.  Mildly reduced EF.  GR 1 DD.  Calcified mitral annulus.     SOCIAL HISTORY: Social History   Socioeconomic History   Marital status: Single    Spouse name: Not on file   Number of children: Not on file   Years of education: Not on file   Highest education level: Not on file  Occupational History   Not on file  Tobacco Use   Smoking status: Former Smoker    Packs/day: 1.00    Years: 50.00    Pack years: 50.00    Types: Cigarettes    Quit date: 06/2017    Years since quitting: 2.7   Smokeless tobacco: Never Used  Vaping Use   Vaping Use: Never used  Substance and Sexual Activity   Alcohol use: Yes    Comment: rarely   Drug use: No   Sexual activity: Not Currently  Other Topics Concern   Not on file  Social History Narrative   Not on file   Social Determinants of Health   Financial Resource Strain:    Difficulty of Paying Living Expenses: Not on file  Food Insecurity:    Worried About Charity fundraiser in the Last Year: Not on file   [employer] of Food in the Last Year: Not on file  Transportation Needs:    Lack of Transportation (Medical): Not on file   Lack of Transportation (Non-Medical): Not on file  Physical Activity:    Days of Exercise per Week: Not on file   Minutes of Exercise per Session: Not on file  Stress:    Feeling of Stress : Not on file  Social Connections:    Frequency of Communication with Friends and Family: Not on file   Frequency of Social Gatherings with Friends and Family: Not on file   Attends Religious Services: Not on file   Active Member of Clubs or Organizations: Not on file   Attends Archivist Meetings: Not on file   Marital Status: Not on file  Intimate Partner Violence:    Fear  of Current or Ex-Partner: Not on file   Emotionally Abused: Not on file   Physically Abused: Not on file   Sexually Abused: Not on file    FAMILY HISTORY: Family History  Problem Relation Age of Onset   Heart failure Mother    Heart attack Mother 35       In 61s - 1st MI   Coronary artery disease Mother    Coronary artery disease Father        Died at age 65   Heart attack Father 67   Heart disease Maternal Grandmother    Heart disease Maternal Grandfather    Cancer Neg Hx     ALLERGIES:  has No Known Allergies.  MEDICATIONS:  Current Outpatient Medications  Medication Sig Dispense Refill   acetaminophen (TYLENOL) 500 MG tablet Take 500 mg by mouth every 6 (six) hours as needed.     albuterol (VENTOLIN HFA) 108 (90 Base) MCG/ACT inhaler INHALE 2 PUFFS INTO THE LUNGS EVERY 4 (FOUR) HOURS AS NEEDED FOR WHEEZING OR SHORTNESS OF BREATH. 18 g 5   Baclofen 5 MG TABS Take 1 tablet by mouth 2 (two) times daily as needed. 60 tablet 0   budesonide (PULMICORT) 0.5 MG/2ML nebulizer solution Take 2 mLs (0.5 mg total) by nebulization 2 (two) times daily. 120 mL 5   fexofenadine (ALLEGRA) 180 MG tablet Take 1 tablet (180 mg total) by mouth daily. 30 tablet 1   ipratropium-albuterol (DUONEB) 0.5-2.5 (3) MG/3ML SOLN Take 3 mLs by nebulization every 4 (four) hours as needed. 90 mL 0   metoprolol succinate (TOPROL-XL) 50 MG 24 hr tablet Take 1 tablet (50 mg total) by mouth daily. Take with or immediately following a meal.**PATIENT WILL NEED TO FOLLOW UP WITH CARDIOLOGY FOR FURTHER REFILLS** 90 tablet 0   predniSONE (DELTASONE) 20 MG tablet Take 1 tablet (20 mg total) by mouth daily with breakfast for 5 days. 5 tablet 0   No current facility-administered medications for this visit.    REVIEW OF SYSTEMS:   A 10+ POINT REVIEW OF SYSTEMS WAS OBTAINED including neurology, dermatology, psychiatry, cardiac, respiratory, lymph, extremities, GI, GU, Musculoskeletal, constitutional,  breasts, reproductive, HEENT.  All pertinent positives are noted in the HPI.  All others are negative.   PHYSICAL EXAMINATION: Vitals:   03/18/20 1051  BP: (!) 151/92  Pulse: 95  Resp: 18  Temp: (!) 96.9 F (36.1 C)  SpO2: 93%   Wt Readings from Last 3 Encounters:  03/18/20 142 lb 11.2 oz (64.7 kg)  01/29/20 144 lb (65.3 kg)  12/26/19 143 lb (64.9 kg)   Body mass index is 28.82 kg/m.    GENERAL:alert, in no acute distress and comfortable SKIN: no acute rashes, no significant lesions EYES: conjunctiva are pink and non-injected, sclera anicteric OROPHARYNX: MMM, no exudates, no oropharyngeal erythema or ulceration NECK: supple, no JVD LYMPH:  no palpable lymphadenopathy in the cervical, axillary or inguinal regions LUNGS: clear to auscultation b/l with normal respiratory effort HEART: regular rate & rhythm ABDOMEN:  normoactive bowel sounds , non tender, not distended. No palpable hepatosplenomegaly.  Extremity: no pedal edema PSYCH: alert & oriented x 3 with fluent speech NEURO: no focal motor/sensory deficits  LABORATORY DATA:  I have reviewed the data as listed  . CBC Latest Ref Rng & Units 12/19/2019 05/16/2019 01/17/2019  WBC 4.0 - 10.5 K/uL 4.8 5.8 6.1  Hemoglobin 12.0 - 15.0 g/dL 12.5 12.7 12.6  Hematocrit 36 - 46 % 41.1 41.5 41.7  Platelets 150 - 400 K/uL 301 297 291    . CMP Latest Ref Rng & Units 12/19/2019 09/07/2019 05/16/2019  Glucose 70 - 99 mg/dL 113(H) - 89  BUN 8 - 23 mg/dL 20 21 24(H)  Creatinine 0.44 - 1.00 mg/dL 1.28(H) 1.16(H) 1.20(H)  Sodium 135 - 145 mmol/L 137 - 137  Potassium 3.5 - 5.1 mmol/L 4.7 - 4.7  Chloride 98 - 111 mmol/L 101 - 100  CO2 22 - 32 mmol/L 27 - 25  Calcium 8.9 - 10.3 mg/dL 9.8 - 9.5  Total Protein 6.5 - 8.1 g/dL 7.2 - 7.5  Total Bilirubin 0.3 - 1.2 mg/dL 0.3 - <0.2(L)  Alkaline Phos 38 - 126 U/L 81 - 76  AST 15 - 41  U/L 23 - 21  ALT 0 - 44 U/L 17 - 17    PATHOLOGY      RADIOGRAPHIC STUDIES: I have personally  reviewed the radiological images as listed and agreed with the findings in the report. CT Chest Wo Contrast  Result Date: 03/11/2020 CLINICAL DATA:  Lung cancer, locally recurrent, assess treatment response. EXAM: CT CHEST WITHOUT CONTRAST TECHNIQUE: Multidetector CT imaging of the chest was performed following the standard protocol without IV contrast. COMPARISON:  09/07/2019 and PET 06/08/2019. FINDINGS: Cardiovascular: Atherosclerotic calcification of the aorta, aortic valve and coronary arteries. Heart size normal. No pericardial effusion. Mediastinum/Nodes: AP window lymph nodes measure up to 1.3 cm, increased from 5 mm on 09/07/2019. Suspect left hilar adenopathy but definitive characterization is limited without IV contrast. No axillary adenopathy. Esophagus is unremarkable. Lungs/Pleura: Collapse/consolidation and surrounding architectural distortion in the posterior segment right upper lobe and superior segment right lower lobe, unchanged. Increasing patchy consolidation and ground-glass in the lingula. 3 mm peripheral right lower lobe nodule (7/66), unchanged. 3 mm peripheral left upper lobe nodule (7/58) appears new. No pleural fluid. Airway is unremarkable. Upper Abdomen: Visualized portions of the liver, adrenal glands, kidneys, spleen, pancreas, stomach and bowel are grossly unremarkable. No upper abdominal adenopathy. Musculoskeletal: Degenerative changes in the spine. No worrisome lytic or sclerotic lesions. IMPRESSION: 1. Evolving changes of radiation therapy in the lingula. AP window adenopathy and suspected left hilar adenopathy are highly suspicious for disease progression. 2. 3 mm left upper lobe nodule is new. Continued attention on follow-up exams is warranted. 3. Post radiation scarring in the posterior right hemithorax. 4. Aortic atherosclerosis (ICD10-I70.0). Coronary artery calcification. Electronically Signed   By: Lorin Picket M.D.   On: 03/11/2020 16:08    ASSESSMENT & PLAN:    Chelsea Baker is a 74 y.o. female with   1) h/o right upper lobe squamous cell carcinoma of the lung. Stage I Presenting with a cavitary RUL mass.  Biopsy proven to be squamous cell carcinoma. No overt mediastinal or hilar lymphadenopathy noted. PET/CT showed no overt metastatic disease CT head neg for mets. Patient declined MRI S/p SBRT 60 Gy in 5 fractions 09/01/17 to 09/10/17  CT Chest 10/22/17 shows improvement in the right upper lobe mass after [facility]  03/01/18 CT Chest revealed Continued reduction in spiculated posterior right upper lobe pulmonary nodule. 2. Left upper lobe 1.6 cm spiculated pulmonary nodule is increased in size and density, compatible with malignancy, either a second primary bronchogenic carcinoma or contralateral metastasis. 3. No thoracic adenopathy.  03/18/18 PET/CT revealed Intense metabolic activity associated with enlarging LEFT upper lobe pulmonary nodule is most suggestive primary bronchogenic Carcinoma. 2. Mild to moderate activity associated treated pulmonary nodule in the RIGHT upper lobe with associated moderate metabolic activity in adjacent chest wall favored post radiation change inflammation. 3. Stable additional RIGHT lower lobe pulmonary nodule. 4. No new pulmonary nodules evident. 5. No evidence of metastatic mediastinal adenopathy or distant disease.  07/12/18 CT Chest which revealed 2.2 x 1.2 cm irregular/spiculated posterior right upper lobe nodule, grossly unchanged. 5 x 10 mm left upper lobe nodule, decreased. Aortic Atherosclerosis and Emphysema.  05/16/2019 CT Chest Wo Contrast (Accession [id number]) which revealed "Radiation changes in the posterior right upper lobe and left upper lobe/lingula.10 mm lingular nodule, progressive, suspicious for recurrence."  09/07/2019 CT Chest ([id number]) revealed "1. Recently treated lingular pulmonary nodule is mildly decreased in size. 2. Masslike fibrosis in the posterior right upper lobe is mildly increased,  equivocal for continued  evolution of postradiation change versus early recurrence. 3. Otherwise no new or progressive metastatic disease in the chest."  2) Now with Left upper lobe 1.6 cm spiculated pulmonary nodule- increasing in size. Seen on the 03/18/18 PET/CT as noted above. Newly identified Left enlarging nodule is suspected to be a new primary, and would be considered a new Stage I tumor S/p 54 Gy in 3 fractions 04/06/18 to 04/13/18  3) Dyspnea and rest and on minimal exertion. - much improved CT chest with no PE  significant COPD at baseline -  Needing 2-3 L of oxygen by nasal cannula in hospital. and is now off oxygen ECHO ef 35-40% -Pulmonary function testing -severe obstruction - optimize rx -noted to have systolic cardiomyopathy - mx Dr Ellyn Hack. -Continue follow up with Dr. Sherrilyn Rist in Pulmonology   4) Poor medical f/u. Past Medical History:  Diagnosis Date   AAA (abdominal aortic aneurysm) (HCC)    Diverticulosis    GERD (gastroesophageal reflux disease)    Hiatal hernia    Hypertension    Hypokalemia    Lumbar spondylolysis    Lung cancer ([place]) dx'd 2019   Lung tumor    On home O2    2L N/C   4) Protein calorie malnutrition -nutritional consultation  5) Heavy smoker 75-100 pk-yrs -Pt reported on 08/12/17 that she has completely quit smoking and plans to never smoke again.  -Pt has continued smoking cessation as of 03/08/18 and was encouraged to keep up her good work.   PLAN: -Discussed 03/11/2020 CT Chest ([id number]) which revealed "1. Evolving changes of radiation therapy in the lingula. AP window adenopathy and suspected left hilar adenopathy are highly suspicious for disease progression. 2. 3 mm left upper lobe nodule is new." -Advised pt that 3 mm left upper lobe nodule could be from disease or inflammation. Too small to biopsy at this time. Will continue to watch. -Some concern for the recurrence of pt's Squamous Cell Carcinoma of the Lung  at this time. Will evaluate further with a PET/CT scan.   -Discussed using daily Allegra and a short-course of Prednisone to manage allergies, which may reduce inflammation - pt agrees.  -Recommended that the pt continue to eat well, drink at least 48-64 oz of water each day, and walk 20-30 minutes each day.  -Recommend pt receive the COVID19 and annual flu vaccines. 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 -Rx Allegra & Prednisone  -Will get PET/CT in 6 weeks  -Will see back in 7 weeks    FOLLOW UP: PET/CT in 6 weeks RTC with Dr [name] with labs in 7 weeks   The total time spent in the appt was 20 minutes and more than 50% was on counseling and direct patient cares.  All of the patient's questions were answered with apparent satisfaction. The patient knows to call the clinic with any problems, questions or concerns.    Sullivan Lone MD [place] AAHIVMS [facility] Hematology/Oncology Physician [facility]  (Office):       [phone] (Work cell):  [phone] (Fax):           [phone]  I, Yevette Edwards, am acting as a scribe for Dr. Sullivan Lone.   .I have reviewed the above documentation for accuracy and completeness, and I agree with the above. [name] MD

## 2020-04-10 ENCOUNTER — Ambulatory Visit (HOSPITAL_COMMUNITY)
Admission: RE | Admit: 2020-04-10 | Discharge: 2020-04-10 | Disposition: A | Discharge status: Home / Self Care | Payer: PPO | Source: Ambulatory Visit | Attending: Hematology | Admitting: Hematology

## 2020-04-10 ENCOUNTER — Other Ambulatory Visit: Payer: Self-pay

## 2020-04-10 DIAGNOSIS — C3490 Malignant neoplasm of unspecified part of unspecified bronchus or lung: Secondary | ICD-10-CM | POA: Diagnosis not present

## 2020-04-10 DIAGNOSIS — J8410 Pulmonary fibrosis, unspecified: Secondary | ICD-10-CM | POA: Diagnosis not present

## 2020-04-10 DIAGNOSIS — C787 Secondary malignant neoplasm of liver and intrahepatic bile duct: Secondary | ICD-10-CM | POA: Insufficient documentation

## 2020-04-10 DIAGNOSIS — C3411 Malignant neoplasm of upper lobe, right bronchus or lung: Secondary | ICD-10-CM | POA: Insufficient documentation

## 2020-04-10 DIAGNOSIS — C3492 Malignant neoplasm of unspecified part of left bronchus or lung: Secondary | ICD-10-CM | POA: Diagnosis not present

## 2020-04-10 DIAGNOSIS — J181 Lobar pneumonia, unspecified organism: Secondary | ICD-10-CM | POA: Diagnosis not present

## 2020-04-10 DIAGNOSIS — K7689 Other specified diseases of liver: Secondary | ICD-10-CM | POA: Diagnosis not present

## 2020-04-10 LAB — GLUCOSE, CAPILLARY: Glucose-Capillary: 85 mg/dL (ref 70–99)

## 2020-04-10 MED ORDER — FLUDEOXYGLUCOSE F - 18 (FDG) INJECTION
7.3000 | Freq: Once | INTRAVENOUS | Status: AC
  Administered 2020-04-10: 7.3 via INTRAVENOUS

## 2020-04-12 ENCOUNTER — Telehealth: Payer: Self-pay | Admitting: Hematology

## 2020-04-12 NOTE — Telephone Encounter (Signed)
Scheduled per los, patient has been called and notified. 

## 2020-04-15 ENCOUNTER — Other Ambulatory Visit: Payer: Self-pay

## 2020-04-15 ENCOUNTER — Inpatient Hospital Stay (HOSPITAL_BASED_OUTPATIENT_CLINIC_OR_DEPARTMENT_OTHER): Payer: PPO | Admitting: Hematology

## 2020-04-15 ENCOUNTER — Inpatient Hospital Stay: Payer: PPO | Attending: Hematology

## 2020-04-15 VITALS — BP 143/105 | HR 93 | Temp 98.0°F | Resp 18 | Ht 59.0 in | Wt 143.2 lb

## 2020-04-15 DIAGNOSIS — C3490 Malignant neoplasm of unspecified part of unspecified bronchus or lung: Secondary | ICD-10-CM | POA: Diagnosis not present

## 2020-04-15 DIAGNOSIS — C787 Secondary malignant neoplasm of liver and intrahepatic bile duct: Secondary | ICD-10-CM | POA: Diagnosis not present

## 2020-04-15 DIAGNOSIS — R0600 Dyspnea, unspecified: Secondary | ICD-10-CM | POA: Diagnosis not present

## 2020-04-15 DIAGNOSIS — Z85118 Personal history of other malignant neoplasm of bronchus and lung: Secondary | ICD-10-CM | POA: Diagnosis not present

## 2020-04-15 DIAGNOSIS — C3492 Malignant neoplasm of unspecified part of left bronchus or lung: Secondary | ICD-10-CM

## 2020-04-15 LAB — CBC WITH DIFFERENTIAL/PLATELET
Abs Immature Granulocytes: 0.02 10*3/uL (ref 0.00–0.07)
Basophils Absolute: 0 10*3/uL (ref 0.0–0.1)
Basophils Relative: 1 %
Eosinophils Absolute: 0.2 10*3/uL (ref 0.0–0.5)
Eosinophils Relative: 3 %
HCT: 40.2 % (ref 36.0–46.0)
Hemoglobin: 12.3 g/dL (ref 12.0–15.0)
Immature Granulocytes: 0 %
Lymphocytes Relative: 20 %
Lymphs Abs: 1.1 10*3/uL (ref 0.7–4.0)
MCH: 26.9 pg (ref 26.0–34.0)
MCHC: 30.6 g/dL (ref 30.0–36.0)
MCV: 87.8 fL (ref 80.0–100.0)
Monocytes Absolute: 0.6 10*3/uL (ref 0.1–1.0)
Monocytes Relative: 10 %
Neutro Abs: 3.7 10*3/uL (ref 1.7–7.7)
Neutrophils Relative %: 66 %
Platelets: 303 10*3/uL (ref 150–400)
RBC: 4.58 MIL/uL (ref 3.87–5.11)
RDW: 14.9 % (ref 11.5–15.5)
WBC: 5.7 10*3/uL (ref 4.0–10.5)
nRBC: 0 % (ref 0.0–0.2)

## 2020-04-15 LAB — CMP (CANCER CENTER ONLY)
ALT: 30 U/L (ref 0–44)
AST: 37 U/L (ref 15–41)
Albumin: 3.6 g/dL (ref 3.5–5.0)
Alkaline Phosphatase: 143 U/L — ABNORMAL HIGH (ref 38–126)
Anion gap: 7 (ref 5–15)
BUN: 19 mg/dL (ref 8–23)
CO2: 27 mmol/L (ref 22–32)
Calcium: 10 mg/dL (ref 8.9–10.3)
Chloride: 101 mmol/L (ref 98–111)
Creatinine: 1.3 mg/dL — ABNORMAL HIGH (ref 0.44–1.00)
GFR, Estimated: 40 mL/min — ABNORMAL LOW (ref >60)
Glucose, Bld: 104 mg/dL — ABNORMAL HIGH (ref 70–99)
Potassium: 5 mmol/L (ref 3.5–5.1)
Sodium: 135 mmol/L (ref 135–145)
Total Bilirubin: 0.2 mg/dL — ABNORMAL LOW (ref 0.3–1.2)
Total Protein: 7.5 g/dL (ref 6.5–8.1)

## 2020-04-15 NOTE — Progress Notes (Signed)
HEMATOLOGY/ONCOLOGY CLINIC NOTE  Date of Service: 04/15/2020  Patient Care Team: Chelsea Reid, PA-C as PCP - General (Physician Assistant)  CHIEF COMPLAINTS/PURPOSE OF CONSULTATION:   F/u for recurent squamous cell lung cancer   HISTORY OF PRESENTING ILLNESS:    Chelsea Baker is a wonderful 74 y.o. female who has been referred to [place] by Dr .Sloan Leiter, Henreitta Leber, MD  for evaluation and management of newly diagnosed Squmous cell carcinoma of the RUL.  Patient has a history of hypertension, nicotine dependence, GERD, hiatal hernia, AAA and presented with a 2-week history of cough, weight loss about 20 pounds in about 6 months and increasing shortness of breath over the last several days prior to admission.  In the ED the patient had a chest x-ray which showed a 3.4 cm Which showed a 3.4 cm right upper lobe mass which appeared cavitary.  Subsequent CT of the chest showed a 3.5 cm cavitary mass involving the right upper lobe concerning for a primary bronchogenic carcinoma.  She was also noted to have a 4 mm nodule which is indeterminate in the right lower lobe.  No evidence of metastatic disease noted in the chest or upper abdomen. No evidence of pulmonary embolism.  Patient subsequently had a CT-guided biopsy of the right upper lobe lung lesion on 07/15/2017.  Pathology revealed squamous cell carcinoma of the lung. She had other workup including an AFB smear which was negative.  Patient notes that she does not really have a primary care physician and has not had good medical follow-up and does not really see a doctor regularly. She has had a history of smoking 1/2-2 packs/day for 50 years.  No diagnosed COPD though likely has significant decline in lung function at baseline. Currently was short of breath despite lung cancer not involving a large part of the lung at this time and no evidence of PE.  Still needing 2-3 L of oxygen by nasal cannula currently and endorses shortness  of breath with minimal walking.  She notes that she lives with her son.   INTERVAL HISTORY:  Chelsea Baker is here regarding her lung cancer. The patient's last visit with [place] was on 03/18/2020. The pt reports that she is doing well overall.  The pt reports that she has not smoked in two years. Pt denies any new symptoms such as weight loss or loss of appetite.   Of note since the patient's last visit, pt has had PET/CT ([id number]) completed on 04/10/2020 with results revealing "1. Local hypermetabolic nodal metastatic recurrence in the prevascular nodal station and LEFT hilum. 2. Bands of fibrotic change and consolidation in LEFT upper lobe and RIGHT upper lobe with low metabolic activity are favored post therapy benign findings. 3. Unfortunately, distant metastatic disease with new multifocal hypermetabolic HEPATIC METASTASIS."  Lab results today (04/15/20) of CBC w/diff and CMP is as follows: all values are WNL except for Glucose at 104, Creatinine at 1.30, ALP at 143, Total Bilirubin at 0.2, GFR Est 40.  On review of systems, pt reports SOB, cough and denies weight loss, loss of appetite, dysphagia, back pain, abdominal pain, leg swelling and any other symptoms.    MEDICAL HISTORY:  Past Medical History:  Diagnosis Date  . AAA (abdominal aortic aneurysm) ([place])   . Diverticulosis   . GERD (gastroesophageal reflux disease)   . Hiatal hernia   . Hypertension   . Hypokalemia   . Lumbar spondylolysis   . Lung cancer ([place]) dx'd 2019  .  Lung tumor   . On home O2    2L N/C    SURGICAL HISTORY: Past Surgical History:  Procedure Laterality Date  . ABDOMINAL AORTIC ANEURYSM REPAIR  10/01/10   endostent repair  . CATARACT EXTRACTION, BILATERAL    . LUNG BIOPSY    . NM MYOVIEW LTD  2012   Normal Lexiscan Myoview with no ischemia or infarction.  Normal LV function.  EF 67%.  . TONSILLECTOMY    . TRANSTHORACIC ECHOCARDIOGRAM  07/17/2017   EF 35-40%.  Septal mid and basal inferior  wall hypokinesis.  Mildly dilated LV.  Mildly reduced EF.  GR 1 DD.  Calcified mitral annulus.     SOCIAL HISTORY: Social History   Socioeconomic History  . Marital status: Single    Spouse name: Not on file  . Number of children: Not on file  . Years of education: Not on file  . Highest education level: Not on file  Occupational History  . Not on file  Tobacco Use  . Smoking status: Former Smoker    Packs/day: 1.00    Years: 50.00    Pack years: 50.00    Types: Cigarettes    Quit date: 06/2017    Years since quitting: 2.7  . Smokeless tobacco: Never Used  Vaping Use  . Vaping Use: Never used  Substance and Sexual Activity  . Alcohol use: Yes    Comment: rarely  . Drug use: No  . Sexual activity: Not Currently  Other Topics Concern  . Not on file  Social History Narrative  . Not on file   Social Determinants of Health   Financial Resource Strain:   . Difficulty of Paying Living Expenses: Not on file  Food Insecurity:   . Worried About Charity fundraiser in the Last Year: Not on file  . Ran Out of Food in the Last Year: Not on file  Transportation Needs:   . Lack of Transportation (Medical): Not on file  . Lack of Transportation (Non-Medical): Not on file  Physical Activity:   . Days of Exercise per Week: Not on file  . Minutes of Exercise per Session: Not on file  Stress:   . Feeling of Stress : Not on file  Social Connections:   . Frequency of Communication with Friends and Family: Not on file  . Frequency of Social Gatherings with Friends and Family: Not on file  . Attends Religious Services: Not on file  . Active Member of Clubs or Organizations: Not on file  . Attends Archivist Meetings: Not on file  . Marital Status: Not on file  Intimate Partner Violence:   . Fear of Current or Ex-Partner: Not on file  . Emotionally Abused: Not on file  . Physically Abused: Not on file  . Sexually Abused: Not on file    FAMILY HISTORY: Family History    Problem Relation Age of Onset  . Heart failure Mother   . Heart attack Mother 10       In 15s - 1st MI  . Coronary artery disease Mother   . Coronary artery disease Father        Died at age 63  . Heart attack Father 45  . Heart disease Maternal Grandmother   . Heart disease Maternal Grandfather   . Cancer Neg Hx     ALLERGIES:  has No Known Allergies.  MEDICATIONS:  Current Outpatient Medications  Medication Sig Dispense Refill  . acetaminophen (TYLENOL) 500 MG  tablet Take 500 mg by mouth every 6 (six) hours as needed.    Marland Kitchen albuterol (VENTOLIN HFA) 108 (90 Base) MCG/ACT inhaler INHALE 2 PUFFS INTO THE LUNGS EVERY 4 (FOUR) HOURS AS NEEDED FOR WHEEZING OR SHORTNESS OF BREATH. 18 g 5  . Baclofen 5 MG TABS Take 1 tablet by mouth 2 (two) times daily as needed. 60 tablet 0  . budesonide (PULMICORT) 0.5 MG/2ML nebulizer solution Take 2 mLs (0.5 mg total) by nebulization 2 (two) times daily. 120 mL 5  . fexofenadine (ALLEGRA) 180 MG tablet Take 1 tablet (180 mg total) by mouth daily. 30 tablet 1  . ipratropium-albuterol (DUONEB) 0.5-2.5 (3) MG/3ML SOLN Take 3 mLs by nebulization every 4 (four) hours as needed. 90 mL 0  . metoprolol succinate (TOPROL-XL) 50 MG 24 hr tablet Take 1 tablet (50 mg total) by mouth daily. Take with or immediately following a meal.**PATIENT WILL NEED TO FOLLOW UP WITH CARDIOLOGY FOR FURTHER REFILLS** 90 tablet 0   No current facility-administered medications for this visit.    REVIEW OF SYSTEMS:   A 10+ POINT REVIEW OF SYSTEMS WAS OBTAINED including neurology, dermatology, psychiatry, cardiac, respiratory, lymph, extremities, GI, GU, Musculoskeletal, constitutional, breasts, reproductive, HEENT.  All pertinent positives are noted in the HPI.  All others are negative.   PHYSICAL EXAMINATION: Vitals:   04/15/20 1210  BP: (!) 143/105  Pulse: 93  Resp: 18  Temp: 98 F (36.7 C)  SpO2: 94%   Wt Readings from Last 3 Encounters:  04/15/20 143 lb 3.2 oz (65 kg)   03/18/20 142 lb 11.2 oz (64.7 kg)  01/29/20 144 lb (65.3 kg)   Body mass index is 28.92 kg/m.    GENERAL:alert, in no acute distress and comfortable SKIN: no acute rashes, no significant lesions EYES: conjunctiva are pink and non-injected, sclera anicteric OROPHARYNX: MMM, no exudates, no oropharyngeal erythema or ulceration NECK: supple, no JVD LYMPH:  no palpable lymphadenopathy in the cervical, axillary or inguinal regions LUNGS: clear to auscultation b/l with normal respiratory effort HEART: regular rate & rhythm ABDOMEN:  normoactive bowel sounds , non tender, not distended. No palpable hepatosplenomegaly.  Extremity: no pedal edema PSYCH: alert & oriented x 3 with fluent speech NEURO: no focal motor/sensory deficits  LABORATORY DATA:  I have reviewed the data as listed  . CBC Latest Ref Rng & Units 04/15/2020 12/19/2019 05/16/2019  WBC 4.0 - 10.5 K/uL 5.7 4.8 5.8  Hemoglobin 12.0 - 15.0 g/dL 12.3 12.5 12.7  Hematocrit 36 - 46 % 40.2 41.1 41.5  Platelets 150 - 400 K/uL 303 301 297    . CMP Latest Ref Rng & Units 04/15/2020 12/19/2019 09/07/2019  Glucose 70 - 99 mg/dL 104(H) 113(H) -  BUN 8 - 23 mg/dL 19 20 21   Creatinine 0.44 - 1.00 mg/dL 1.30(H) 1.28(H) 1.16(H)  Sodium 135 - 145 mmol/L 135 137 -  Potassium 3.5 - 5.1 mmol/L 5.0 4.7 -  Chloride 98 - 111 mmol/L 101 101 -  CO2 22 - 32 mmol/L 27 27 -  Calcium 8.9 - 10.3 mg/dL 10.0 9.8 -  Total Protein 6.5 - 8.1 g/dL 7.5 7.2 -  Total Bilirubin 0.3 - 1.2 mg/dL 0.2(L) 0.3 -  Alkaline Phos 38 - 126 U/L 143(H) 81 -  AST 15 - 41 U/L 37 23 -  ALT 0 - 44 U/L 30 17 -    PATHOLOGY      RADIOGRAPHIC STUDIES: I have personally reviewed the radiological images as listed and agreed with the  findings in the report. NM PET Image Restag (PS) Skull Base To Thigh  Result Date: 04/10/2020 CLINICAL DATA:  Subsequent treatment strategy for non-small cell lung cancer. Local recurrence. Assess response to treatment. Recurrence  squamous cell carcinoma EXAM: NUCLEAR MEDICINE PET SKULL BASE TO THIGH TECHNIQUE: 7.3 mCi F-18 FDG was injected intravenously. Full-ring PET imaging was performed from the skull base to thigh after the radiotracer. CT data was obtained and used for attenuation correction and anatomic localization. Fasting blood glucose: 85 mg/dl COMPARISON:  Chest CT 03/11/2020, PET-CT 06/08/2019 FINDINGS: Mediastinal blood pool activity: SUV max 2.1 Liver activity: SUV max NA NECK: No hypermetabolic lymph nodes in the neck. Incidental CT findings: none CHEST: There is enlarged hypermetabolic prevascular nodes with intense metabolic activity (SUV max equal 22). Nodes measure 12 mm short axis (image 53). Hypermetabolic LEFT hilar mass measures 1.8 cm with SUV max equal 20.1. Only mild activity associated with the post radiation fibrotic change in the LEFT upper lobe consistent with benign post radiation change. Band of atelectasis consolidation in the RIGHT upper lobe without also with mild metabolic activity (SUV max equal 3.4) similar to prior Incidental CT findings: none ABDOMEN/PELVIS: There are 6 new round hypodense lesions in the liver with intense metabolic activity. Example lesion in the central RIGHT hepatic lobe measuring 3.2 cm with SUV max equal 16.5. Small example lesion in the lateral segment of the LEFT hepatic lobe measuring 1.3 cm with SUV max equal 13.5. Adrenal glands normal. No hypermetabolic upper abdominal or retroperitoneal adenopathy. No hypermetabolic pelvic adenopathy. Incidental CT findings: Abdominal aortic stent graft noted. Uterus and adnexa unremarkable SKELETON: No focal hypermetabolic activity to suggest skeletal metastasis. Incidental CT findings: none IMPRESSION: 1. Local hypermetabolic nodal metastatic recurrence in the prevascular nodal station and LEFT hilum. 2. Bands of fibrotic change and consolidation in LEFT upper lobe and RIGHT upper lobe with low metabolic activity are favored post therapy  benign findings. 3. Unfortunately, distant metastatic disease with new multifocal hypermetabolic HEPATIC METASTASIS. Electronically Signed   By: Suzy Bouchard M.D.   On: 04/10/2020 15:24    ASSESSMENT & PLAN:   ARVADA SEABORN is a 74 y.o. female with   1) h/o right upper lobe squamous cell carcinoma of the lung. Stage I Presenting with a cavitary RUL mass.  Biopsy proven to be squamous cell carcinoma. No overt mediastinal or hilar lymphadenopathy noted. PET/CT showed no overt metastatic disease CT head neg for mets. Patient declined MRI S/p SBRT 60 Gy in 5 fractions 09/01/17 to 09/10/17  CT Chest 10/22/17 shows improvement in the right upper lobe mass [facility]  03/01/18 CT Chest revealed Continued reduction in spiculated posterior right upper lobe pulmonary nodule. 2. Left upper lobe 1.6 cm spiculated pulmonary nodule is increased in size and density, compatible with malignancy, either a second primary bronchogenic carcinoma or contralateral metastasis. 3. No thoracic adenopathy.  03/18/18 PET/CT revealed Intense metabolic activity associated with enlarging LEFT upper lobe pulmonary nodule is most suggestive primary bronchogenic Carcinoma. 2. Mild to moderate activity associated treated pulmonary nodule in the RIGHT upper lobe with associated moderate metabolic activity in adjacent chest wall favored post radiation change inflammation. 3. Stable additional RIGHT lower lobe pulmonary nodule. 4. No new pulmonary nodules evident. 5. No evidence of metastatic mediastinal adenopathy or distant disease.  07/12/18 CT Chest which revealed 2.2 x 1.2 cm irregular/spiculated posterior right upper lobe nodule, grossly unchanged. 5 x 10 mm left upper lobe nodule, decreased. Aortic Atherosclerosis and Emphysema.  05/16/2019 CT  Chest Wo Contrast (Accession [id number]) which revealed "Radiation changes in the posterior right upper lobe and left upper lobe/lingula.10 mm lingular nodule, progressive, suspicious  for recurrence."  09/07/2019 CT Chest ([id number]) revealed "1. Recently treated lingular pulmonary nodule is mildly decreased in size. 2. Masslike fibrosis in the posterior right upper lobe is mildly increased, equivocal for continued evolution of postradiation change versus early recurrence. 3. Otherwise no new or progressive metastatic disease in the chest."  2) Now with Left upper lobe 1.6 cm spiculated pulmonary nodule- increasing in size. Seen on the 03/18/18 PET/CT as noted above. Newly identified Left enlarging nodule is suspected to be a new primary, and would be considered a new Stage I tumor S/p 54 Gy in 3 fractions 04/06/18 to 04/13/18  3) Dyspnea and rest and on minimal exertion. - much improved CT chest with no PE  significant COPD at baseline -  Needing 2-3 L of oxygen by nasal cannula in hospital. and is now off oxygen ECHO ef 35-40% -Pulmonary function testing -severe obstruction - optimize rx -noted to have systolic cardiomyopathy - mx Dr Ellyn Hack. -Continue follow up with Dr. Sherrilyn Rist in Pulmonology   4) Poor medical f/u. Past Medical History:  Diagnosis Date  . AAA (abdominal aortic aneurysm) ([place])   . Diverticulosis   . GERD (gastroesophageal reflux disease)   . Hiatal hernia   . Hypertension   . Hypokalemia   . Lumbar spondylolysis   . Lung cancer ([place]) dx'd 2019  . Lung tumor   . On home O2    2L N/C   4) Protein calorie malnutrition -nutritional consultation  5) Heavy smoker 75-100 pk-yrs -Pt reported on 08/12/17 that she has completely quit smoking and plans to never smoke again.  -Pt has continued smoking cessation as of 03/08/18 and was encouraged to keep up her good work.   PLAN: -Discussed pt labwork today, 04/15/20; blood counts are nml, ALP is borderline elevated, kidney function is stable. -Discussed 04/10/2020 PET/CT ([id number]) which revealed "1. Local hypermetabolic nodal metastatic recurrence in the prevascular nodal station and LEFT  hilum. 2. Bands of fibrotic change and consolidation in LEFT upper lobe and RIGHT upper lobe with low metabolic activity are favored post therapy benign findings. 3. Unfortunately, distant metastatic disease with new multifocal hypermetabolic HEPATIC METASTASIS." -Advised pt that we would like to biopsy a hepatic lesion as metastatic Squamous Cell Carcinomas do not typically spread in this pattern. -Advised pt that a Bx of the hepatic lesion would give [place] an opportunity to get genetic testing to see best treatment options. -Will get CT guided Bx of metastatic liver lesion in 1 week. -Will see back in 2 weeks with labs   FOLLOW UP: Ct guided biopsy of metastatic liver lesion in 1 week RTC with Dr [name] in 2 weeks   The total time spent in the appt was 20 minutes and more than 50% was on counseling and direct patient cares.  All of the patient's questions were answered with apparent satisfaction. The patient knows to call the clinic with any problems, questions or concerns.    Sullivan Lone MD [place] AAHIVMS [facility] Hematology/Oncology Physician [facility]  (Office):       [phone] (Work cell):  [phone] (Fax):           [phone]  I, Yevette Edwards, am acting as a scribe for Dr. Sullivan Lone.   .I have reviewed the above documentation for accuracy and completeness, and I agree with the above. Marland Kitchen  [name] MD

## 2020-04-22 ENCOUNTER — Telehealth: Payer: Self-pay | Admitting: *Deleted

## 2020-04-22 NOTE — Telephone Encounter (Signed)
Received call from patient. She states that Dr. [name] told her at her last visit that he was going to order a biopsy due to results of recent PET scan done 04/10/20 She has not heard about the date and time of biopsy. She is not sure what , exactly, is going to be biopsied. Advised that Dr. [name] is not in the office today or tomorrow, 04/23/20 but will be back on 04/24/20 Advised that I would send him a message along with his nurse Mcneil Sober so they can follow up with her on 04/24/20 [place] voiced understanding. Message sent to Dr. [name] and Luther Parody, Rn

## 2020-04-24 ENCOUNTER — Encounter (HOSPITAL_COMMUNITY): Payer: Self-pay

## 2020-04-24 ENCOUNTER — Telehealth: Payer: Self-pay | Admitting: *Deleted

## 2020-04-24 NOTE — Progress Notes (Signed)
Chelsea Baker Female, 74 y.o., 27-Jul-1945 MRN:  [id number] Phone:  [phone] Jerilynn Mages) PCP:  Lorrene Reid, PA-C Coverage:  [employer] Ppo Next Appt With Radiology ([facility]-US 2) 05/03/2020 at 1:00 PM  RE: Biopsy Received: Yesterday Message Details  Suttle, Rosanne Ashing, MD  Lennox Solders E Approved for ultrasound guided focal liver biopsy of hepatic metastasis, multiple lesions for potential target.   Dylan   Previous Messages  ----- Message -----  From: Lenore Cordia  Sent: 04/23/2020 11:51 AM EDT  To: [facility] Procedure Requests  Subject: Biopsy                      Procedure Requested: CT Biopsy    Reason for Procedure: patient with recurrent localized squamous cell lung cancer with new hypermetabolic liver lesions for biopsy to determine etiology ? metastatic lung cancer    Provider Requesting: [name]  Provider Telephone: [phone]    Other Info:

## 2020-04-24 NOTE — Telephone Encounter (Signed)
U/S core biopsy liver scheduled 11/5. Per Dr. [name], schedule patient to see him 3-4 business days after biopsy. Contacted patient- will cancel appt with Dr. [name] 11/2 and send schedule message to r/s week of 11/8. Patient verbalized understanding.

## 2020-04-25 ENCOUNTER — Telehealth: Payer: Self-pay | Admitting: Hematology

## 2020-04-25 NOTE — Telephone Encounter (Signed)
Scheduled appt per 10/27 sch msg - pt aware.

## 2020-04-28 ENCOUNTER — Other Ambulatory Visit: Payer: Self-pay | Admitting: Physician Assistant

## 2020-04-28 DIAGNOSIS — I159 Secondary hypertension, unspecified: Secondary | ICD-10-CM

## 2020-04-30 ENCOUNTER — Ambulatory Visit: Payer: PPO | Admitting: Hematology

## 2020-05-02 ENCOUNTER — Other Ambulatory Visit: Payer: Self-pay | Admitting: Radiology

## 2020-05-03 ENCOUNTER — Telehealth: Payer: Self-pay | Admitting: Hematology

## 2020-05-03 ENCOUNTER — Ambulatory Visit (HOSPITAL_COMMUNITY)
Admission: RE | Admit: 2020-05-03 | Discharge: 2020-05-03 | Disposition: A | Discharge status: Home / Self Care | Payer: PPO | Source: Ambulatory Visit | Attending: Physician Assistant | Admitting: Physician Assistant

## 2020-05-03 ENCOUNTER — Ambulatory Visit (HOSPITAL_COMMUNITY)
Admission: RE | Admit: 2020-05-03 | Discharge: 2020-05-03 | Disposition: A | Discharge status: Home / Self Care | Payer: PPO | Source: Ambulatory Visit | Attending: Hematology | Admitting: Hematology

## 2020-05-03 ENCOUNTER — Encounter (HOSPITAL_COMMUNITY): Payer: Self-pay

## 2020-05-03 ENCOUNTER — Other Ambulatory Visit: Payer: Self-pay

## 2020-05-03 DIAGNOSIS — C3402 Malignant neoplasm of left main bronchus: Secondary | ICD-10-CM | POA: Insufficient documentation

## 2020-05-03 DIAGNOSIS — Z8679 Personal history of other diseases of the circulatory system: Secondary | ICD-10-CM | POA: Diagnosis not present

## 2020-05-03 DIAGNOSIS — I10 Essential (primary) hypertension: Secondary | ICD-10-CM | POA: Diagnosis not present

## 2020-05-03 DIAGNOSIS — Z923 Personal history of irradiation: Secondary | ICD-10-CM | POA: Diagnosis not present

## 2020-05-03 DIAGNOSIS — Z85118 Personal history of other malignant neoplasm of bronchus and lung: Secondary | ICD-10-CM | POA: Insufficient documentation

## 2020-05-03 DIAGNOSIS — Z79899 Other long term (current) drug therapy: Secondary | ICD-10-CM | POA: Diagnosis not present

## 2020-05-03 DIAGNOSIS — C787 Secondary malignant neoplasm of liver and intrahepatic bile duct: Secondary | ICD-10-CM | POA: Insufficient documentation

## 2020-05-03 DIAGNOSIS — C3490 Malignant neoplasm of unspecified part of unspecified bronchus or lung: Secondary | ICD-10-CM

## 2020-05-03 DIAGNOSIS — K7689 Other specified diseases of liver: Secondary | ICD-10-CM | POA: Diagnosis not present

## 2020-05-03 DIAGNOSIS — C229 Malignant neoplasm of liver, not specified as primary or secondary: Secondary | ICD-10-CM | POA: Diagnosis not present

## 2020-05-03 DIAGNOSIS — C778 Secondary and unspecified malignant neoplasm of lymph nodes of multiple regions: Secondary | ICD-10-CM | POA: Diagnosis not present

## 2020-05-03 DIAGNOSIS — C771 Secondary and unspecified malignant neoplasm of intrathoracic lymph nodes: Secondary | ICD-10-CM | POA: Insufficient documentation

## 2020-05-03 DIAGNOSIS — Z87891 Personal history of nicotine dependence: Secondary | ICD-10-CM | POA: Diagnosis not present

## 2020-05-03 DIAGNOSIS — C3491 Malignant neoplasm of unspecified part of right bronchus or lung: Secondary | ICD-10-CM | POA: Diagnosis not present

## 2020-05-03 DIAGNOSIS — Z7951 Long term (current) use of inhaled steroids: Secondary | ICD-10-CM | POA: Insufficient documentation

## 2020-05-03 DIAGNOSIS — Z9981 Dependence on supplemental oxygen: Secondary | ICD-10-CM | POA: Diagnosis not present

## 2020-05-03 DIAGNOSIS — R160 Hepatomegaly, not elsewhere classified: Secondary | ICD-10-CM | POA: Diagnosis present

## 2020-05-03 LAB — COMPREHENSIVE METABOLIC PANEL
ALT: 37 U/L (ref 0–44)
AST: 46 U/L — ABNORMAL HIGH (ref 15–41)
Albumin: 4.1 g/dL (ref 3.5–5.0)
Alkaline Phosphatase: 133 U/L — ABNORMAL HIGH (ref 38–126)
Anion gap: 12 (ref 5–15)
BUN: 19 mg/dL (ref 8–23)
CO2: 25 mmol/L (ref 22–32)
Calcium: 9.5 mg/dL (ref 8.9–10.3)
Chloride: 100 mmol/L (ref 98–111)
Creatinine, Ser: 1.38 mg/dL — ABNORMAL HIGH (ref 0.44–1.00)
GFR, Estimated: 40 mL/min — ABNORMAL LOW (ref >60)
Glucose, Bld: 102 mg/dL — ABNORMAL HIGH (ref 70–99)
Potassium: 4.4 mmol/L (ref 3.5–5.1)
Sodium: 137 mmol/L (ref 135–145)
Total Bilirubin: 0.6 mg/dL (ref 0.3–1.2)
Total Protein: 7.9 g/dL (ref 6.5–8.1)

## 2020-05-03 LAB — PROTIME-INR
INR: 0.9 (ref 0.8–1.2)
Prothrombin Time: 11.4 seconds (ref 11.4–15.2)

## 2020-05-03 LAB — CBC WITH DIFFERENTIAL/PLATELET
Abs Immature Granulocytes: 0.01 10*3/uL (ref 0.00–0.07)
Basophils Absolute: 0.1 10*3/uL (ref 0.0–0.1)
Basophils Relative: 1 %
Eosinophils Absolute: 0.2 10*3/uL (ref 0.0–0.5)
Eosinophils Relative: 4 %
HCT: 41.3 % (ref 36.0–46.0)
Hemoglobin: 12.5 g/dL (ref 12.0–15.0)
Immature Granulocytes: 0 %
Lymphocytes Relative: 20 %
Lymphs Abs: 1 10*3/uL (ref 0.7–4.0)
MCH: 26.6 pg (ref 26.0–34.0)
MCHC: 30.3 g/dL (ref 30.0–36.0)
MCV: 87.9 fL (ref 80.0–100.0)
Monocytes Absolute: 0.5 10*3/uL (ref 0.1–1.0)
Monocytes Relative: 10 %
Neutro Abs: 3.2 10*3/uL (ref 1.7–7.7)
Neutrophils Relative %: 65 %
Platelets: 299 10*3/uL (ref 150–400)
RBC: 4.7 MIL/uL (ref 3.87–5.11)
RDW: 14.8 % (ref 11.5–15.5)
WBC: 4.9 10*3/uL (ref 4.0–10.5)
nRBC: 0 % (ref 0.0–0.2)

## 2020-05-03 MED ORDER — MIDAZOLAM HCL 2 MG/2ML IJ SOLN
INTRAMUSCULAR | Status: AC
  Filled 2020-05-03: qty 2

## 2020-05-03 MED ORDER — MIDAZOLAM HCL 2 MG/2ML IJ SOLN
INTRAMUSCULAR | Status: AC | PRN
  Administered 2020-05-03 (×2): 1 mg via INTRAVENOUS

## 2020-05-03 MED ORDER — SODIUM CHLORIDE 0.9 % IV SOLN: INTRAVENOUS | Status: DC

## 2020-05-03 MED ORDER — LIDOCAINE HCL 1 % IJ SOLN
INTRAMUSCULAR | Status: AC
  Filled 2020-05-03: qty 20

## 2020-05-03 MED ORDER — FENTANYL CITRATE (PF) 100 MCG/2ML IJ SOLN
INTRAMUSCULAR | Status: AC | PRN
  Administered 2020-05-03 (×2): 50 ug via INTRAVENOUS

## 2020-05-03 MED ORDER — FENTANYL CITRATE (PF) 100 MCG/2ML IJ SOLN
INTRAMUSCULAR | Status: AC
  Filled 2020-05-03: qty 2

## 2020-05-03 MED ORDER — LIDOCAINE HCL (PF) 1 % IJ SOLN
INTRAMUSCULAR | Status: AC | PRN
  Administered 2020-05-03: 5 mL

## 2020-05-03 MED ORDER — GELATIN ABSORBABLE 12-7 MM EX MISC
CUTANEOUS | Status: AC
  Filled 2020-05-03: qty 1

## 2020-05-03 NOTE — Telephone Encounter (Signed)
Called pt per 11/5 sch msg - left for patient with new apt date and time

## 2020-05-03 NOTE — Discharge Instructions (Signed)
Please call Interventional Radiology clinic [phone] with any questions or concerns.  You may remove your dressing and shower tomorrow. Do not submerge in tub or pool until healing well.   Liver Biopsy, Care After These instructions give you information on caring for yourself after your procedure. Your doctor may also give you more specific instructions. Call your doctor if you have any problems or questions after your procedure. What can I expect after the procedure? After the procedure, it is common to have:  Pain and soreness where the biopsy was done.  Bruising around the area where the biopsy was done.  Sleepiness and be tired for a few days. Follow these instructions at home: Medicines  Take over-the-counter and prescription medicines only as told by your doctor.  If you were prescribed an antibiotic medicine, take it as told by your doctor. Do not stop taking the antibiotic even if you start to feel better.  Do not take medicines such as aspirin and ibuprofen. These medicines can thin your blood. Do not take these medicines unless your doctor tells you to take them.  If you are taking prescription pain medicine, take actions to prevent or treat constipation. Your doctor may recommend that you: ? Drink enough fluid to keep your pee (urine) clear or pale yellow. ? Take over-the-counter or prescription medicines. ? Eat foods that are high in fiber, such as fresh fruits and vegetables, whole grains, and beans. ? Limit foods that are high in fat and processed sugars, such as fried and sweet foods. Caring for your cut  Follow instructions from your doctor about how to take care of your cuts from surgery (incisions). Make sure you: ? Wash your hands with soap and water before you change your bandage (dressing). If you cannot use soap and water, use hand sanitizer. ? Change your bandage as told by your doctor. ? Leave stitches (sutures), skin glue, or skin tape (adhesive) strips  in place. They may need to stay in place for 2 weeks or longer. If tape strips get loose and curl up, you may trim the loose edges. Do not remove tape strips completely unless your doctor says it is okay.  Check your cuts every day for signs of infection. Check for: ? Redness, swelling, or more pain. ? Fluid or blood. ? Pus or a bad smell. ? Warmth.  Do not take baths, swim, or use a hot tub until your doctor says it is okay to do so. Activity  Rest at home for 1-2 days or as told by your doctor. ? Avoid sitting for a long time without moving. Get up to take short walks every 1-2 hours.  Return to your normal activities as told by your doctor. Ask what activities are safe for you.  Do not do these things in the first 24 hours: ? Drive. ? Use machinery. ? Take a bath or shower.  Do not lift more than 10 pounds (4.5 kg) or play contact sports for the first 2 weeks. General instructions  Do not drink alcohol in the first week after the procedure.  Have someone stay with you for at least 24 hours after the procedure.  Get your test results. Ask your doctor or the department that is doing the test: ? When will my results be ready? ? How will I get my results? ? What are my treatment options? ? What other tests do I need? ? What are my next steps?  Keep all follow-up visits as told by  your doctor. This is important. Contact a doctor if:  A cut bleeds and leaves more than just a small spot of blood.  A cut is red, puffs up (swells), or hurts more than before.  Fluid or something else comes from a cut.  A cut smells bad.  You have a fever or chills. Get help right away if:  You have swelling, bloating, or pain in your belly (abdomen).  You get dizzy or faint.  You have a rash.  You feel sick to your stomach (nauseous) or throw up (vomit).  You have trouble breathing, feel short of breath, or feel faint.  Your chest hurts.  You have problems talking or  seeing.  You have trouble with your balance or moving your arms or legs. Summary  After the procedure, it is common to have pain, soreness, bruising, and tiredness.  Your doctor will tell you how to take care of yourself at home. Change your bandage, take your medicines, and limit your activities as told by your doctor.  Call your doctor if you have symptoms of infection. Get help right away if your belly swells, your cut bleeds a lot, or you have trouble talking or breathing. This information is not intended to replace advice given to you by your health care provider. Make sure you discuss any questions you have with your health care provider. Document Revised: 06/25/2017 Document Reviewed: 06/25/2017 Elsevier Patient Education  [place].    Moderate Conscious Sedation, Adult, Care After These instructions provide you with information about caring for yourself after your procedure. Your health care provider may also give you more specific instructions. Your treatment has been planned according to current medical practices, but problems sometimes occur. Call your health care provider if you have any problems or questions after your procedure. What can I expect after the procedure? After your procedure, it is common:  To feel sleepy for several hours.  To feel clumsy and have poor balance for several hours.  To have poor judgment for several hours.  To vomit if you eat too soon. Follow these instructions at home: For at least 24 hours after the procedure:  Do not: ? Participate in activities where you could fall or become injured. ? Drive. ? Use heavy machinery. ? Drink alcohol. ? Take sleeping pills or medicines that cause drowsiness. ? Make important decisions or sign legal documents. ? Take care of children on your own.  Rest. Eating and drinking  Follow the diet recommended by your health care provider.  If you vomit: ? Drink water, juice, or soup when you  can drink without vomiting. ? Make sure you have little or no nausea before eating solid foods. General instructions  Have a responsible adult stay with you until you are awake and alert.  Take over-the-counter and prescription medicines only as told by your health care provider.  If you smoke, do not smoke without supervision.  Keep all follow-up visits as told by your health care provider. This is important. Contact a health care provider if:  You keep feeling nauseous or you keep vomiting.  You feel light-headed.  You develop a rash.  You have a fever. Get help right away if:  You have trouble breathing. This information is not intended to replace advice given to you by your health care provider. Make sure you discuss any questions you have with your health care provider. Document Revised: 05/28/2017 Document Reviewed: 10/05/2015 Elsevier Patient Education  2020 [employer].

## 2020-05-03 NOTE — Procedures (Signed)
Interventional Radiology Procedure Note  Procedure: US Guided Biopsy of left lobe liver lesion  Complications: None  Estimated Blood Loss: < 10 mL  Findings: 18 G core biopsy of 3 cm left lobe liver lesion performed under US guidance.  Three core samples obtained and sent to Pathology.  Venetia Night. Kathlene Cote, M.D Pager:  [phone]

## 2020-05-03 NOTE — H&P (Signed)
Chief Complaint: Patient was seen in consultation today for liver lesion biopsy   Referring Physician(s): [name]  Supervising Physician: Aletta Edouard  Patient Status: [facility] - Out-pt  History of Present Illness: Chelsea Baker is a 74 y.o. female with a medical history significant for HTN, abdominal aortic aneurysm and recurrent squamous cell lung cancer, initially identified January 2019. She has been treated with radiation therapy. Recent imaging shows findings concern for recurrence with possible metastases.   NM PET 04/10/20  IMPRESSION: 1. Local hypermetabolic nodal metastatic recurrence in the prevascular nodal station and LEFT hilum. 2. Bands of fibrotic change and consolidation in LEFT upper lobe and RIGHT upper lobe with low metabolic activity are favored post therapy benign findings. 3. Unfortunately, distant metastatic disease with new multifocal hypermetabolic HEPATIC METASTASIS.  ABDOMEN/PELVIS: There are 6 new round hypodense lesions in the liver with intense metabolic activity. Example lesion in the central RIGHT hepatic lobe measuring 3.2 cm with SUV max equal 16.5. Small example lesion in the lateral segment of the LEFT hepatic lobe measuring 1.3 cm with SUV max equal 13.5.  Interventional Radiology has been asked to evaluate this patient for an image-guided liver lesion biopsy. This case has been reviewed and procedure approved by Dr. Serafina Royals.   Past Medical History:  Diagnosis Date  . AAA (abdominal aortic aneurysm) ([place])   . Diverticulosis   . GERD (gastroesophageal reflux disease)   . Hiatal hernia   . Hypertension   . Hypokalemia   . Lumbar spondylolysis   . Lung cancer ([place]) dx'd 2019  . Lung tumor   . On home O2    2L N/C    Past Surgical History:  Procedure Laterality Date  . ABDOMINAL AORTIC ANEURYSM REPAIR  10/01/10   endostent repair  . CATARACT EXTRACTION, BILATERAL    . LUNG BIOPSY    . NM MYOVIEW LTD  2012    Normal Lexiscan Myoview with no ischemia or infarction.  Normal LV function.  EF 67%.  . TONSILLECTOMY    . TRANSTHORACIC ECHOCARDIOGRAM  07/17/2017   EF 35-40%.  Septal mid and basal inferior wall hypokinesis.  Mildly dilated LV.  Mildly reduced EF.  GR 1 DD.  Calcified mitral annulus.     Allergies: Patient has no known allergies.  Medications: Prior to Admission medications   Medication Sig Start Date End Date Taking? Authorizing Provider  acetaminophen (TYLENOL) 500 MG tablet Take 500 mg by mouth every 6 (six) hours as needed.   Yes [provider]  albuterol (VENTOLIN HFA) 108 (90 Base) MCG/ACT inhaler INHALE 2 PUFFS INTO THE LUNGS EVERY 4 (FOUR) HOURS AS NEEDED FOR WHEEZING OR SHORTNESS OF BREATH. 10/09/19  Yes Olalere, Adewale A, MD  Baclofen 5 MG TABS Take 1 tablet by mouth 2 (two) times daily as needed. 01/29/20  Yes Abonza, Maritza, PA-C  budesonide (PULMICORT) 0.5 MG/2ML nebulizer solution Take 2 mLs (0.5 mg total) by nebulization 2 (two) times daily. 01/12/19  Yes Olalere, Adewale A, MD  fexofenadine (ALLEGRA) 180 MG tablet Take 1 tablet (180 mg total) by mouth daily. 03/18/20  Yes [name], MD  ipratropium-albuterol (DUONEB) 0.5-2.5 (3) MG/3ML SOLN Take 3 mLs by nebulization every 4 (four) hours as needed. 12/19/18  Yes Olalere, Adewale A, MD  metoprolol succinate (TOPROL-XL) 50 MG 24 hr tablet TAKE 1 TAB DAILY. TAKE WITH OR IMMEDIATELY FOLLOWING A MEAL.**PATIENT WILL NEED TO FOLLOW UP WITH MD 04/29/20  Yes Lorrene Reid, PA-C     Family History  Problem Relation Age of Onset  . Heart failure Mother   . Heart attack Mother 62       In 40s - 1st MI  . Coronary artery disease Mother   . Coronary artery disease Father        Died at age 62  . Heart attack Father 43  . Heart disease Maternal Grandmother   . Heart disease Maternal Grandfather   . Cancer Neg Hx     Social History   Socioeconomic History  . Marital status: Single    Spouse name: Not on  file  . Number of children: Not on file  . Years of education: Not on file  . Highest education level: Not on file  Occupational History  . Not on file  Tobacco Use  . Smoking status: Former Smoker    Packs/day: 1.00    Years: 50.00    Pack years: 50.00    Types: Cigarettes    Quit date: 06/2017    Years since quitting: 2.8  . Smokeless tobacco: Never Used  Vaping Use  . Vaping Use: Never used  Substance and Sexual Activity  . Alcohol use: Yes    Comment: rarely  . Drug use: No  . Sexual activity: Not Currently  Other Topics Concern  . Not on file  Social History Narrative  . Not on file   Social Determinants of Health   Financial Resource Strain:   . Difficulty of Paying Living Expenses: Not on file  Food Insecurity:   . Worried About Charity fundraiser in the Last Year: Not on file  . Ran Out of Food in the Last Year: Not on file  Transportation Needs:   . Lack of Transportation (Medical): Not on file  . Lack of Transportation (Non-Medical): Not on file  Physical Activity:   . Days of Exercise per Week: Not on file  . Minutes of Exercise per Session: Not on file  Stress:   . Feeling of Stress : Not on file  Social Connections:   . Frequency of Communication with Friends and Family: Not on file  . Frequency of Social Gatherings with Friends and Family: Not on file  . Attends Religious Services: Not on file  . Active Member of Clubs or Organizations: Not on file  . Attends Archivist Meetings: Not on file  . Marital Status: Not on file    Review of Systems: A 12 point ROS discussed and pertinent positives are indicated in the HPI above.  All other systems are negative.  Review of Systems  Constitutional: Negative for appetite change and fatigue.  Respiratory: Positive for shortness of breath. Negative for cough.        Chronic. Past history of tobacco dependence.   Cardiovascular: Negative for chest pain and leg swelling.  Gastrointestinal:  Negative for abdominal pain, diarrhea, nausea and vomiting.  Neurological: Negative for dizziness and headaches.  Hematological: Does not bruise/bleed easily.    Vital Signs: BP (!) 149/81 (BP Location: Right Arm)   Pulse 81   Temp 97.9 F (36.6 C) (Oral)   Resp 18   SpO2 97%   Physical Exam Constitutional:      General: She is not in acute distress.    Appearance: She is not ill-appearing.  HENT:     Mouth/Throat:     Mouth: Mucous membranes are moist.     Pharynx: Oropharynx is clear.  Cardiovascular:     Rate and Rhythm: Normal rate and  regular rhythm.     Pulses: Normal pulses.     Heart sounds: Normal heart sounds.  Pulmonary:     Effort: Pulmonary effort is normal.     Breath sounds: Normal breath sounds.  Abdominal:     General: Bowel sounds are normal.     Palpations: Abdomen is soft.  Skin:    General: Skin is warm and dry.  Neurological:     Mental Status: She is alert and oriented to person, place, and time.  Psychiatric:        Mood and Affect: Mood normal.        Behavior: Behavior normal.        Thought Content: Thought content normal.        Judgment: Judgment normal.     Imaging: NM PET Image Restag (PS) Skull Base To Thigh  Result Date: 04/10/2020 CLINICAL DATA:  Subsequent treatment strategy for non-small cell lung cancer. Local recurrence. Assess response to treatment. Recurrence squamous cell carcinoma EXAM: NUCLEAR MEDICINE PET SKULL BASE TO THIGH TECHNIQUE: 7.3 mCi F-18 FDG was injected intravenously. Full-ring PET imaging was performed from the skull base to thigh after the radiotracer. CT data was obtained and used for attenuation correction and anatomic localization. Fasting blood glucose: 85 mg/dl COMPARISON:  Chest CT 03/11/2020, PET-CT 06/08/2019 FINDINGS: Mediastinal blood pool activity: SUV max 2.1 Liver activity: SUV max NA NECK: No hypermetabolic lymph nodes in the neck. Incidental CT findings: none CHEST: There is enlarged hypermetabolic  prevascular nodes with intense metabolic activity (SUV max equal 22). Nodes measure 12 mm short axis (image 53). Hypermetabolic LEFT hilar mass measures 1.8 cm with SUV max equal 20.1. Only mild activity associated with the post radiation fibrotic change in the LEFT upper lobe consistent with benign post radiation change. Band of atelectasis consolidation in the RIGHT upper lobe without also with mild metabolic activity (SUV max equal 3.4) similar to prior Incidental CT findings: none ABDOMEN/PELVIS: There are 6 new round hypodense lesions in the liver with intense metabolic activity. Example lesion in the central RIGHT hepatic lobe measuring 3.2 cm with SUV max equal 16.5. Small example lesion in the lateral segment of the LEFT hepatic lobe measuring 1.3 cm with SUV max equal 13.5. Adrenal glands normal. No hypermetabolic upper abdominal or retroperitoneal adenopathy. No hypermetabolic pelvic adenopathy. Incidental CT findings: Abdominal aortic stent graft noted. Uterus and adnexa unremarkable SKELETON: No focal hypermetabolic activity to suggest skeletal metastasis. Incidental CT findings: none IMPRESSION: 1. Local hypermetabolic nodal metastatic recurrence in the prevascular nodal station and LEFT hilum. 2. Bands of fibrotic change and consolidation in LEFT upper lobe and RIGHT upper lobe with low metabolic activity are favored post therapy benign findings. 3. Unfortunately, distant metastatic disease with new multifocal hypermetabolic HEPATIC METASTASIS. Electronically Signed   By: Suzy Bouchard M.D.   On: 04/10/2020 15:24    Labs:  CBC: Recent Labs    05/16/19 1320 12/19/19 0946 04/15/20 1121 05/03/20 1130  WBC 5.8 4.8 5.7 4.9  HGB 12.7 12.5 12.3 12.5  HCT 41.5 41.1 40.2 41.3  PLT 297 301 303 299    COAGS: Recent Labs    05/03/20 1130  INR 0.9    BMP: Recent Labs    05/16/19 1320 05/16/19 1320 09/07/19 1402 12/19/19 0946 04/15/20 1121 05/03/20 1130  NA 137  --   --  137 135  137  K 4.7  --   --  4.7 5.0 4.4  CL 100  --   --  101 101 100  CO2 25  --   --  27 27 25   GLUCOSE 89  --   --  113* 104* 102*  BUN 24*   < > 21 20 19 19   CALCIUM 9.5  --   --  9.8 10.0 9.5  CREATININE 1.20*   < > 1.16* 1.28* 1.30* 1.38*  GFRNONAA 45*   < > 46* 41* 40* 40*  GFRAA 52*  --  54* 48*  --   --    < > = values in this interval not displayed.    LIVER FUNCTION TESTS: Recent Labs    05/16/19 1320 12/19/19 0946 04/15/20 1121 05/03/20 1130  BILITOT <0.2* 0.3 0.2* 0.6  AST 21 23 37 46*  ALT 17 17 30  37  ALKPHOS 76 81 143* 133*  PROT 7.5 7.2 7.5 7.9  ALBUMIN 4.1 3.9 3.6 4.1    TUMOR MARKERS: No results for input(s): AFPTM, CEA, CA199, CHROMGRNA in the last 8760 hours.  Assessment and Plan:  Squamous cell lung cancer with possible liver metastases: Ileana Roup, 74 year old female, presents today to the [facility] department for an image-guided liver lesion biopsy.  Risks and benefits of of this procedure were discussed with the patient including, but not limited to bleeding, infection, damage to adjacent structures or low yield requiring additional tests.  All of the questions were answered and there is agreement to proceed.  She has been NPO. Labs and vitals have been reviewed. She does not take any blood-thinning medications.   Consent signed and in chart.  Thank you for this interesting consult.  I greatly enjoyed [place] and look forward to participating in their care.  A copy of this report was sent to the requesting provider on this date.  Electronically Signed: Soyla Dryer, AGACNP-BC [phone] 05/03/2020, 12:18 PM   I spent a total of  30 Minutes   in face to face in clinical consultation, greater than 50% of which was counseling/coordinating care for image-guided liver lesion biopsy

## 2020-05-06 ENCOUNTER — Ambulatory Visit: Payer: PPO | Admitting: Medical

## 2020-05-06 ENCOUNTER — Inpatient Hospital Stay: Payer: PPO | Admitting: Hematology

## 2020-05-07 ENCOUNTER — Ambulatory Visit: Payer: PPO | Admitting: Hematology

## 2020-05-07 ENCOUNTER — Other Ambulatory Visit: Payer: PPO

## 2020-05-08 LAB — SURGICAL PATHOLOGY

## 2020-05-13 ENCOUNTER — Inpatient Hospital Stay: Payer: PPO

## 2020-05-13 ENCOUNTER — Inpatient Hospital Stay: Payer: PPO | Attending: Hematology | Admitting: Hematology

## 2020-05-13 ENCOUNTER — Other Ambulatory Visit: Payer: Self-pay

## 2020-05-13 VITALS — BP 188/101 | HR 119 | Temp 97.2°F | Resp 18 | Ht 59.0 in | Wt 141.3 lb

## 2020-05-13 DIAGNOSIS — E46 Unspecified protein-calorie malnutrition: Secondary | ICD-10-CM | POA: Insufficient documentation

## 2020-05-13 DIAGNOSIS — Z23 Encounter for immunization: Secondary | ICD-10-CM | POA: Insufficient documentation

## 2020-05-13 DIAGNOSIS — Z7189 Other specified counseling: Secondary | ICD-10-CM

## 2020-05-13 DIAGNOSIS — C3490 Malignant neoplasm of unspecified part of unspecified bronchus or lung: Secondary | ICD-10-CM

## 2020-05-13 DIAGNOSIS — C3411 Malignant neoplasm of upper lobe, right bronchus or lung: Secondary | ICD-10-CM | POA: Diagnosis not present

## 2020-05-13 DIAGNOSIS — Z79899 Other long term (current) drug therapy: Secondary | ICD-10-CM | POA: Diagnosis not present

## 2020-05-13 DIAGNOSIS — Z5111 Encounter for antineoplastic chemotherapy: Secondary | ICD-10-CM | POA: Insufficient documentation

## 2020-05-13 DIAGNOSIS — C787 Secondary malignant neoplasm of liver and intrahepatic bile duct: Secondary | ICD-10-CM | POA: Diagnosis not present

## 2020-05-13 DIAGNOSIS — Z5112 Encounter for antineoplastic immunotherapy: Secondary | ICD-10-CM | POA: Diagnosis not present

## 2020-05-13 DIAGNOSIS — J449 Chronic obstructive pulmonary disease, unspecified: Secondary | ICD-10-CM | POA: Diagnosis not present

## 2020-05-13 NOTE — Progress Notes (Signed)
   Covid-19 Vaccination Clinic  Name:  Chelsea Baker    MRN: [id number] DOB: 1946/06/15  05/13/2020  [name] was observed post Covid-19 immunization for 30 minutes based on pre-vaccination screening without incident. She was provided with Vaccine Information Sheet and instruction to access the V-Safe system.   [name] was instructed to call 911 with any severe reactions post vaccine: Marland Kitchen Difficulty breathing  . Swelling of face and throat  . A fast heartbeat  . A bad rash all over body  . Dizziness and weakness   Immunizations Administered    Name Date Dose VIS Date Route   Pfizer COVID-19 Vaccine 05/13/2020  1:37 PM 0.3 mL 04/17/2020 Intramuscular   Manufacturer: [place]   Lot: [id number]   NDC: [address]-3

## 2020-05-13 NOTE — Progress Notes (Signed)
**Note Chelsea-Identified via Obfuscation** HEMATOLOGY/ONCOLOGY CLINIC NOTE  Date of Service: 05/13/2020  Patient Care Team: Chelsea Reid, PA-C as PCP - General (Physician Assistant)  CHIEF COMPLAINTS/PURPOSE OF CONSULTATION:   F/u for recurent squamous cell lung cancer   HISTORY OF PRESENTING ILLNESS:    [name] is a wonderful 74 y.o. female who has been referred to [place] by Dr .Chelsea Baker, Chelsea Leber, MD  for evaluation and management of newly diagnosed Squmous cell carcinoma of the RUL.  Patient has a history of hypertension, nicotine dependence, GERD, hiatal hernia, AAA and presented with a 2-week history of cough, weight loss about 20 pounds in about 6 months and increasing shortness of breath over the last several days prior to admission.  In the ED the patient had a chest x-ray which showed a 3.4 cm Which showed a 3.4 cm right upper lobe mass which appeared cavitary.  Subsequent CT of the chest showed a 3.5 cm cavitary mass involving the right upper lobe concerning for a primary bronchogenic carcinoma.  She was also noted to have a 4 mm nodule which is indeterminate in the right lower lobe.  No evidence of metastatic disease noted in the chest or upper abdomen. No evidence of pulmonary embolism.  Patient subsequently had a CT-guided biopsy of the right upper lobe lung lesion on 07/15/2017.  Pathology revealed squamous cell carcinoma of the lung. She had other workup including an AFB smear which was negative.  Patient notes that she does not really have a primary care physician and has not had good medical follow-up and does not really see a doctor regularly. She has had a history of smoking 1/2-2 packs/day for 50 years.  No diagnosed COPD though likely has significant decline in lung function at baseline. Currently was short of breath despite lung cancer not involving a large part of the lung at this time and no evidence of PE.  Still needing 2-3 L of oxygen by nasal cannula currently and endorses shortness  of breath with minimal walking.  She notes that she lives with her son.   INTERVAL HISTORY:  Chelsea Baker is here regarding her lung cancer. We are joined by her friend, Chelsea Baker. The patient's last visit with [place] was on 04/15/2020. The pt reports that she is doing well overall.  The pt reports that she has been feeling well in the interim and denies any new symptoms. Pt has not received her COVID19 vaccines.  Of note since the patient's last visit, pt has had Surgical Pathology Report ([id number]) completed on 05/03/2020 with results revealing "Poorly differentiated carcinoma."  Lab results today (05/03/20) of CBC w/diff and CMP is as follows: all values are WNL except for Glucose at 102, Creatinine at 1.38, AST at 46, ALP at 133, GFR Est at 40.  On review of systems, pt denies weight loss, abdominal pain, bowel habit changes, urinary habit changes, new fatigue and any other symptoms.   MEDICAL HISTORY:  Past Medical History:  Diagnosis Date  . AAA (abdominal aortic aneurysm) ([place])   . Diverticulosis   . GERD (gastroesophageal reflux disease)   . Hiatal hernia   . Hypertension   . Hypokalemia   . Lumbar spondylolysis   . Lung cancer ([place]) dx'd 2019  . Lung tumor   . On home O2    2L N/C    SURGICAL HISTORY: Past Surgical History:  Procedure Laterality Date  . ABDOMINAL AORTIC ANEURYSM REPAIR  10/01/10   endostent repair  . CATARACT EXTRACTION, BILATERAL    .  LUNG BIOPSY    . NM MYOVIEW LTD  2012   Normal Lexiscan Myoview with no ischemia or infarction.  Normal LV function.  EF 67%.  . TONSILLECTOMY    . TRANSTHORACIC ECHOCARDIOGRAM  07/17/2017   EF 35-40%.  Septal mid and basal inferior wall hypokinesis.  Mildly dilated LV.  Mildly reduced EF.  GR 1 DD.  Calcified mitral annulus.     SOCIAL HISTORY: Social History   Socioeconomic History  . Marital status: Single    Spouse name: Not on file  . Number of children: Not on file  . Years of education: Not on  file  . Highest education level: Not on file  Occupational History  . Not on file  Tobacco Use  . Smoking status: Former Smoker    Packs/day: 1.00    Years: 50.00    Pack years: 50.00    Types: Cigarettes    Quit date: 06/2017    Years since quitting: 2.8  . Smokeless tobacco: Never Used  Vaping Use  . Vaping Use: Never used  Substance and Sexual Activity  . Alcohol use: Yes    Comment: rarely  . Drug use: No  . Sexual activity: Not Currently  Other Topics Concern  . Not on file  Social History Narrative  . Not on file   Social Determinants of Health   Financial Resource Strain:   . Difficulty of Paying Living Expenses: Not on file  Food Insecurity:   . Worried About Charity fundraiser in the Last Year: Not on file  . Ran Out of Food in the Last Year: Not on file  Transportation Needs:   . Lack of Transportation (Medical): Not on file  . Lack of Transportation (Non-Medical): Not on file  Physical Activity:   . Days of Exercise per Week: Not on file  . Minutes of Exercise per Session: Not on file  Stress:   . Feeling of Stress : Not on file  Social Connections:   . Frequency of Communication with Friends and Family: Not on file  . Frequency of Social Gatherings with Friends and Family: Not on file  . Attends Religious Services: Not on file  . Active Member of Clubs or Organizations: Not on file  . Attends Archivist Meetings: Not on file  . Marital Status: Not on file  Intimate Partner Violence:   . Fear of Current or Ex-Partner: Not on file  . Emotionally Abused: Not on file  . Physically Abused: Not on file  . Sexually Abused: Not on file    FAMILY HISTORY: Family History  Problem Relation Age of Onset  . Heart failure Mother   . Heart attack Mother 72       In 67s - 1st MI  . Coronary artery disease Mother   . Coronary artery disease Father        Died at age 75  . Heart attack Father 76  . Heart disease Maternal Grandmother   . Heart  disease Maternal Grandfather   . Cancer Neg Hx     ALLERGIES:  has No Known Allergies.  MEDICATIONS:  Current Outpatient Medications  Medication Sig Dispense Refill  . acetaminophen (TYLENOL) 500 MG tablet Take 500 mg by mouth every 6 (six) hours as needed.    Marland Kitchen albuterol (VENTOLIN HFA) 108 (90 Base) MCG/ACT inhaler INHALE 2 PUFFS INTO THE LUNGS EVERY 4 (FOUR) HOURS AS NEEDED FOR WHEEZING OR SHORTNESS OF BREATH. 18 g 5  .  Baclofen 5 MG TABS Take 1 tablet by mouth 2 (two) times daily as needed. 60 tablet 0  . budesonide (PULMICORT) 0.5 MG/2ML nebulizer solution Take 2 mLs (0.5 mg total) by nebulization 2 (two) times daily. 120 mL 5  . fexofenadine (ALLEGRA) 180 MG tablet Take 1 tablet (180 mg total) by mouth daily. 30 tablet 1  . ipratropium-albuterol (DUONEB) 0.5-2.5 (3) MG/3ML SOLN Take 3 mLs by nebulization every 4 (four) hours as needed. 90 mL 0  . metoprolol succinate (TOPROL-XL) 50 MG 24 hr tablet TAKE 1 TAB DAILY. TAKE WITH OR IMMEDIATELY FOLLOWING A MEAL.**PATIENT WILL NEED TO FOLLOW UP WITH MD 60 tablet 0   No current facility-administered medications for this visit.    REVIEW OF SYSTEMS:   A 10+ POINT REVIEW OF SYSTEMS WAS OBTAINED including neurology, dermatology, psychiatry, cardiac, respiratory, lymph, extremities, GI, GU, Musculoskeletal, constitutional, breasts, reproductive, HEENT.  All pertinent positives are noted in the HPI.  All others are negative.   PHYSICAL EXAMINATION: Vitals:   05/13/20 1204  BP: (!) 188/101  Pulse: (!) 119  Resp: 18  Temp: (!) 97.2 F (36.2 C)  SpO2: 94%   Wt Readings from Last 3 Encounters:  05/13/20 141 lb 4.8 oz (64.1 kg)  04/15/20 143 lb 3.2 oz (65 kg)  03/18/20 142 lb 11.2 oz (64.7 kg)   Body mass index is 28.54 kg/m.    GENERAL:alert, in no acute distress and comfortable SKIN: no acute rashes, no significant lesions EYES: conjunctiva are pink and non-injected, sclera anicteric OROPHARYNX: MMM, no exudates, no oropharyngeal  erythema or ulceration NECK: supple, no JVD LYMPH:  no palpable lymphadenopathy in the cervical, axillary or inguinal regions LUNGS: clear to auscultation b/l with normal respiratory effort HEART: regular rate & rhythm ABDOMEN:  normoactive bowel sounds , non tender, not distended. No palpable hepatosplenomegaly.  Extremity: no pedal edema PSYCH: alert & oriented x 3 with fluent speech NEURO: no focal motor/sensory deficits  LABORATORY DATA:  I have reviewed the data as listed  . CBC Latest Ref Rng & Units 05/03/2020 04/15/2020 12/19/2019  WBC 4.0 - 10.5 K/uL 4.9 5.7 4.8  Hemoglobin 12.0 - 15.0 g/dL 12.5 12.3 12.5  Hematocrit 36 - 46 % 41.3 40.2 41.1  Platelets 150 - 400 K/uL 299 303 301    . CMP Latest Ref Rng & Units 05/03/2020 04/15/2020 12/19/2019  Glucose 70 - 99 mg/dL 102(H) 104(H) 113(H)  BUN 8 - 23 mg/dL 19 19 20   Creatinine 0.44 - 1.00 mg/dL 1.38(H) 1.30(H) 1.28(H)  Sodium 135 - 145 mmol/L 137 135 137  Potassium 3.5 - 5.1 mmol/L 4.4 5.0 4.7  Chloride 98 - 111 mmol/L 100 101 101  CO2 22 - 32 mmol/L 25 27 27   Calcium 8.9 - 10.3 mg/dL 9.5 10.0 9.8  Total Protein 6.5 - 8.1 g/dL 7.9 7.5 7.2  Total Bilirubin 0.3 - 1.2 mg/dL 0.6 0.2(L) 0.3  Alkaline Phos 38 - 126 U/L 133(H) 143(H) 81  AST 15 - 41 U/L 46(H) 37 23  ALT 0 - 44 U/L 37 30 17   05/03/2020 Surgical Pathology Report ([id number]):    PATHOLOGY      RADIOGRAPHIC STUDIES: I have personally reviewed the radiological images as listed and agreed with the findings in the report. [place] CORE BIOPSY (LIVER)  Result Date: 05/03/2020 INDICATION: History of squamous carcinoma of the right lung with imaging evidence of metastatic disease recurrence including multiple liver lesions. The patient presents for liver biopsy to confirm recurrence. EXAM: ULTRASOUND GUIDED  CORE BIOPSY OF LIVER MASS MEDICATIONS: None. ANESTHESIA/SEDATION: Fentanyl 100 mcg IV; Versed 2.0 mg IV Moderate Sedation Time:  12 minutes. The patient was  continuously monitored during the procedure by the interventional radiology nurse under my direct supervision. PROCEDURE: The procedure, risks, benefits, and alternatives were explained to the patient. Questions regarding the procedure were encouraged and answered. The patient understands and consents to the procedure. A time-out was performed prior to initiating the procedure. Ultrasound was used to localize liver lesions. The abdominal was prepped with chlorhexidine in a sterile fashion, and a sterile drape was applied covering the operative field. A sterile gown and sterile gloves were used for the procedure. Local anesthesia was provided with 1% Lidocaine. Under ultrasound guidance, a 17 gauge needle was advanced to the level of a lesion within the left lobe of the liver. After confirming needle tip position, 3 separate coaxial 18 gauge core biopsy samples were obtained and submitted in formalin. Gel-Foam pledgets were advanced through the outer needle as the needle was removed and retracted. Additional ultrasound was performed. COMPLICATIONS: None immediate. FINDINGS: There are multiple hypoechoic masses throughout the liver. A 3 cm lesion in the lateral segment of the left lobe was chosen for biopsy. Solid tissue was obtained. IMPRESSION: Ultrasound-guided core biopsy performed a 3 cm lesion within the left lobe of the liver. Electronically Signed   By: Aletta Edouard M.D.   On: 05/03/2020 14:52    ASSESSMENT & PLAN:   TIANA SIVERTSON is a 74 y.o. female with   1) h/o right upper lobe squamous cell carcinoma of the lung. Stage I Presenting with a cavitary RUL mass.  Biopsy proven to be squamous cell carcinoma. No overt mediastinal or hilar lymphadenopathy noted. PET/CT showed no overt metastatic disease CT head neg for mets. Patient declined MRI S/p SBRT 60 Gy in 5 fractions 09/01/17 to 09/10/17  CT Chest 10/22/17 shows improvement in the right upper lobe mass after [facility]  03/01/18 CT Chest revealed  Continued reduction in spiculated posterior right upper lobe pulmonary nodule. 2. Left upper lobe 1.6 cm spiculated pulmonary nodule is increased in size and density, compatible with malignancy, either a second primary bronchogenic carcinoma or contralateral metastasis. 3. No thoracic adenopathy.  03/18/18 PET/CT revealed Intense metabolic activity associated with enlarging LEFT upper lobe pulmonary nodule is most suggestive primary bronchogenic Carcinoma. 2. Mild to moderate activity associated treated pulmonary nodule in the RIGHT upper lobe with associated moderate metabolic activity in adjacent chest wall favored post radiation change inflammation. 3. Stable additional RIGHT lower lobe pulmonary nodule. 4. No new pulmonary nodules evident. 5. No evidence of metastatic mediastinal adenopathy or distant disease.  07/12/18 CT Chest which revealed 2.2 x 1.2 cm irregular/spiculated posterior right upper lobe nodule, grossly unchanged. 5 x 10 mm left upper lobe nodule, decreased. Aortic Atherosclerosis and Emphysema.  05/16/2019 CT Chest Wo Contrast (Accession [id number]) which revealed "Radiation changes in the posterior right upper lobe and left upper lobe/lingula.10 mm lingular nodule, progressive, suspicious for recurrence."  09/07/2019 CT Chest ([id number]) revealed "1. Recently treated lingular pulmonary nodule is mildly decreased in size. 2. Masslike fibrosis in the posterior right upper lobe is mildly increased, equivocal for continued evolution of postradiation change versus early recurrence. 3. Otherwise no new or progressive metastatic disease in the chest."  04/10/2020 PET/CT ([id number]) revealed "1. Local hypermetabolic nodal metastatic recurrence in the prevascular nodal station and LEFT hilum. 2. Bands of fibrotic change and consolidation in LEFT upper lobe and RIGHT upper lobe with  low metabolic activity are favored post therapy benign findings. 3. Unfortunately, distant metastatic disease  with new multifocal hypermetabolic HEPATIC METASTASIS."   2) Now with Left upper lobe 1.6 cm spiculated pulmonary nodule- increasing in size. Seen on the 03/18/18 PET/CT as noted above. Newly identified Left enlarging nodule is suspected to be a new primary, and would be considered a new Stage I tumor S/p 54 Gy in 3 fractions 04/06/18 to 04/13/18  3) Dyspnea and rest and on minimal exertion. - much improved CT chest with no PE  significant COPD at baseline -  Needing 2-3 L of oxygen by nasal cannula in hospital. and is now off oxygen ECHO ef 35-40% -Pulmonary function testing -severe obstruction - optimize rx -noted to have systolic cardiomyopathy - mx Dr Ellyn Hack. -Continue follow up with Dr. Sherrilyn Rist in Pulmonology   4) Poor medical f/u. Past Medical History:  Diagnosis Date  . AAA (abdominal aortic aneurysm) ([place])   . Diverticulosis   . GERD (gastroesophageal reflux disease)   . Hiatal hernia   . Hypertension   . Hypokalemia   . Lumbar spondylolysis   . Lung cancer ([place]) dx'd 2019  . Lung tumor   . On home O2    2L N/C   4) Protein calorie malnutrition -nutritional consultation  5) Heavy smoker 75-100 pk-yrs -Pt reported on 08/12/17 that she has completely quit smoking and plans to never smoke again.  -Pt has continued smoking cessation as of 03/08/18 and was encouraged to keep up her good work.  PLAN: -Discussed pt labwork, 05/03/20;  all values are WNL except for Glucose at 102, Creatinine at 1.38, AST at 46, ALP at 133, GFR Est at 40. -Discussed 05/03/2020 Surgical Pathology Report ([id number]) which revealed "Poorly differentiated carcinoma." -Advised pt that poorly differentiated carcinomas have a difficult to find origin and tend to be faster growing and more aggressive.  -Advised pt that it is most likely also Squamous Cell Carcinoma.  -Advised pt that as this is stage 4 disease treatment would be palliative and not curative.  -Advised pt that treatment  can cause hair loss and fatigue. Discussed pt's goals of care.  -Advised pt that Carboplatin/Taxol/Pembrolizumab would be our treatment choice. -Advised pt that treatment would be once every 3 weeks with GCF support and after 4-6 cycles we could hold chemotherapy and continue immunotherapy until progression or intolerance. -Will get chemo-counseling for carbo/taxol/pembrolizumab in 2-3 days, will begin treatment in 7-10 days. -Advised pt that her carcinoma, as well as treatment, weaken her immune system. Pt also advised that a COVID19 infection would cause [place] to hold treatment for at least 3 weeks. Recommend pt receive her COVID19 vaccines - pt will get first dose in clinic today.  -Will see back in 14-17 days with labs   FOLLOW UP: Covid vaccine dose 1 today Plz schedule Covid vaccine Dose 2 in 3 weeks Chemo-counseling for carbo/taxol/pembrolizumab in 2-3 days Plz schedule to start carbo/taxol/pembrolizumab in 7-10 days with labs MD visit with labs 1 week after C1 of treatment   The total time spent in the appt was 40  minutes and more than 50% was on counseling and direct patient cares.  All of the patient's questions were answered with apparent satisfaction. The patient knows to call the clinic with any problems, questions or concerns.    Sullivan Lone MD [place] AAHIVMS [facility] Hematology/Oncology Physician [facility]  (Office):       [phone] (Work cell):  [phone] (Fax):  [phone]  I, Yevette Edwards, am acting as a scribe for Dr. Sullivan Lone.   .I have reviewed the above documentation for accuracy and completeness, and I agree with the above. [name] MD

## 2020-05-14 ENCOUNTER — Other Ambulatory Visit: Payer: Self-pay | Admitting: Physician Assistant

## 2020-05-14 DIAGNOSIS — I159 Secondary hypertension, unspecified: Secondary | ICD-10-CM

## 2020-05-16 ENCOUNTER — Other Ambulatory Visit: Payer: PPO

## 2020-05-17 ENCOUNTER — Inpatient Hospital Stay: Payer: PPO

## 2020-05-17 ENCOUNTER — Other Ambulatory Visit: Payer: Self-pay

## 2020-05-17 DIAGNOSIS — Z7189 Other specified counseling: Secondary | ICD-10-CM | POA: Insufficient documentation

## 2020-05-17 DIAGNOSIS — C787 Secondary malignant neoplasm of liver and intrahepatic bile duct: Secondary | ICD-10-CM

## 2020-05-17 DIAGNOSIS — C3490 Malignant neoplasm of unspecified part of unspecified bronchus or lung: Secondary | ICD-10-CM

## 2020-05-17 HISTORY — DX: Other specified counseling: Z71.89

## 2020-05-17 HISTORY — DX: Secondary malignant neoplasm of liver and intrahepatic bile duct: C78.7

## 2020-05-17 HISTORY — DX: Malignant neoplasm of unspecified part of unspecified bronchus or lung: C34.90

## 2020-05-17 MED ORDER — DEXAMETHASONE 4 MG PO TABS: 8.0000 mg | ORAL_TABLET | Freq: Every day | ORAL | 1 refills | Status: DC

## 2020-05-17 MED ORDER — ONDANSETRON HCL 8 MG PO TABS: 8.0000 mg | ORAL_TABLET | Freq: Two times a day (BID) | ORAL | 1 refills | Status: AC | PRN

## 2020-05-17 MED ORDER — LORAZEPAM 0.5 MG PO TABS: 0.5000 mg | ORAL_TABLET | Freq: Four times a day (QID) | ORAL | 0 refills | Status: DC | PRN

## 2020-05-17 MED ORDER — PROCHLORPERAZINE MALEATE 10 MG PO TABS: 10.0000 mg | ORAL_TABLET | Freq: Four times a day (QID) | ORAL | 1 refills | Status: AC | PRN

## 2020-05-17 NOTE — Progress Notes (Signed)
START ON PATHWAY REGIMEN - Non-Small Cell Lung     A cycle is every 21 days:     Pembrolizumab      Paclitaxel      Carboplatin   **Always confirm dose/schedule in your pharmacy ordering system**  Patient Characteristics: Stage IV Metastatic, Squamous, PS = 0, 1, First Line, PD-L1 Expression Positive 1-49% (TPS) / Negative / Not Tested / Awaiting Test Results and Immunotherapy Candidate Therapeutic Status: Stage IV Metastatic Histology: Squamous Cell Line of therapy: First Line ECOG Performance Status: 1 PD-L1 Expression Status: Awaiting Test Results Immunotherapy Candidate Status: Candidate for Immunotherapy Intent of Therapy: Non-Curative / Palliative Intent, Discussed with Patient 

## 2020-05-20 ENCOUNTER — Encounter: Payer: Self-pay | Admitting: *Deleted

## 2020-05-20 NOTE — Progress Notes (Signed)
[place] Work  Clinical Social Work received a call from patient requesting information on disability.  Patient is currently working part time and also receiving her social security retirement benefit.  Patient stated she did not have any short term disability benefits through her employer.  CSW and patient discussed social security disability (SSDI).  CSW explained that once a person turns 48 there SDDI converts to social security retirement benefit.  CSW provided brief education on CSW role and the support team.  CSW encouraged patient to call with any additional questions or concerns.    Chelsea Baker, MSW, LCSW, OSW-C Clinical Social Worker [facility] [phone]

## 2020-05-21 ENCOUNTER — Ambulatory Visit: Payer: PPO

## 2020-05-21 ENCOUNTER — Telehealth: Payer: Self-pay | Admitting: Hematology

## 2020-05-21 NOTE — Telephone Encounter (Signed)
Scheduled per 11/23 staff msg. Called and spoke with pt, confirmed 11/27 appt

## 2020-05-22 ENCOUNTER — Other Ambulatory Visit: Payer: Self-pay | Admitting: Hematology

## 2020-05-22 ENCOUNTER — Telehealth: Payer: Self-pay | Admitting: Hematology

## 2020-05-22 NOTE — Progress Notes (Signed)
.   Pharmacist Chemotherapy Monitoring - Initial Assessment    Anticipated start date: 05/25/20   Regimen:  . Are orders appropriate based on the patient's diagnosis, regimen, and cycle? Yes . Does the plan date match the patient's scheduled date? Yes . Is the sequencing of drugs appropriate? Yes . Are the premedications appropriate for the patient's regimen? Yes . Prior Authorization for treatment is: Approved o If applicable, is the correct biosimilar selected based on the patient's insurance? yes  Organ Function and Labs: Marland Kitchen Are dose adjustments needed based on the patient's renal function, hepatic function, or hematologic function? No . Are appropriate labs ordered prior to the start of patient's treatment? Yes . Other organ system assessment, if indicated: N/A . The following baseline labs, if indicated, have been ordered: pembrolizumab: baseline TSH +/- T4  Dose Assessment: . Are the drug doses appropriate? Yes . Are the following correct: o Drug concentrations Yes o IV fluid compatible with drug Yes o Administration routes Yes o Timing of therapy Yes . If applicable, does the patient have documented access for treatment and/or plans for port-a-cath placement? yes . If applicable, have lifetime cumulative doses been properly documented and assessed? not applicable Lifetime Dose Tracking  No doses have been documented on this patient for the following tracked chemicals: Doxorubicin, Epirubicin, Idarubicin, Daunorubicin, Mitoxantrone, Bleomycin, Oxaliplatin, Carboplatin, Liposomal Doxorubicin  o   Toxicity Monitoring/Prevention: . The patient has the following take home antiemetics prescribed: Prochlorperazine . The patient has the following take home medications prescribed: N/A . Medication allergies and previous infusion related reactions, if applicable, have been reviewed and addressed. Yes . The patient's current medication list has been assessed for drug-drug interactions  with their chemotherapy regimen. no significant drug-drug interactions were identified on review.  Order Review: . Are the treatment plan orders signed? Yes . Is the patient scheduled to see a provider prior to their treatment? No  I verify that I have reviewed each item in the above checklist and answered each question accordingly.  Wynona Neat 05/22/2020 12:27 PM

## 2020-05-22 NOTE — Telephone Encounter (Signed)
Scheduled appt per 11/24 sch msg - left message for patient with appt date and time

## 2020-05-24 ENCOUNTER — Inpatient Hospital Stay: Payer: PPO

## 2020-05-24 ENCOUNTER — Other Ambulatory Visit: Payer: Self-pay

## 2020-05-24 DIAGNOSIS — Z5112 Encounter for antineoplastic immunotherapy: Secondary | ICD-10-CM | POA: Diagnosis not present

## 2020-05-24 DIAGNOSIS — Z7189 Other specified counseling: Secondary | ICD-10-CM

## 2020-05-24 DIAGNOSIS — C787 Secondary malignant neoplasm of liver and intrahepatic bile duct: Secondary | ICD-10-CM

## 2020-05-24 LAB — CBC WITH DIFFERENTIAL/PLATELET
Abs Immature Granulocytes: 0.03 10*3/uL (ref 0.00–0.07)
Basophils Absolute: 0 10*3/uL (ref 0.0–0.1)
Basophils Relative: 0 %
Eosinophils Absolute: 0.2 10*3/uL (ref 0.0–0.5)
Eosinophils Relative: 2 %
HCT: 40.7 % (ref 36.0–46.0)
Hemoglobin: 12.2 g/dL (ref 12.0–15.0)
Immature Granulocytes: 0 %
Lymphocytes Relative: 13 %
Lymphs Abs: 0.9 10*3/uL (ref 0.7–4.0)
MCH: 26.2 pg (ref 26.0–34.0)
MCHC: 30 g/dL (ref 30.0–36.0)
MCV: 87.3 fL (ref 80.0–100.0)
Monocytes Absolute: 0.7 10*3/uL (ref 0.1–1.0)
Monocytes Relative: 11 %
Neutro Abs: 5 10*3/uL (ref 1.7–7.7)
Neutrophils Relative %: 74 %
Platelets: 305 10*3/uL (ref 150–400)
RBC: 4.66 MIL/uL (ref 3.87–5.11)
RDW: 14.9 % (ref 11.5–15.5)
WBC: 6.8 10*3/uL (ref 4.0–10.5)
nRBC: 0 % (ref 0.0–0.2)

## 2020-05-24 LAB — CMP (CANCER CENTER ONLY)
ALT: 68 U/L — ABNORMAL HIGH (ref 0–44)
AST: 91 U/L — ABNORMAL HIGH (ref 15–41)
Albumin: 3.5 g/dL (ref 3.5–5.0)
Alkaline Phosphatase: 320 U/L — ABNORMAL HIGH (ref 38–126)
Anion gap: 12 (ref 5–15)
BUN: 18 mg/dL (ref 8–23)
CO2: 23 mmol/L (ref 22–32)
Calcium: 9.7 mg/dL (ref 8.9–10.3)
Chloride: 99 mmol/L (ref 98–111)
Creatinine: 1.45 mg/dL — ABNORMAL HIGH (ref 0.44–1.00)
GFR, Estimated: 38 mL/min — ABNORMAL LOW (ref >60)
Glucose, Bld: 111 mg/dL — ABNORMAL HIGH (ref 70–99)
Potassium: 4.6 mmol/L (ref 3.5–5.1)
Sodium: 134 mmol/L — ABNORMAL LOW (ref 135–145)
Total Bilirubin: 0.6 mg/dL (ref 0.3–1.2)
Total Protein: 7.3 g/dL (ref 6.5–8.1)

## 2020-05-24 LAB — TSH: TSH: 0.902 u[IU]/mL (ref 0.308–3.960)

## 2020-05-25 ENCOUNTER — Other Ambulatory Visit: Payer: Self-pay

## 2020-05-25 ENCOUNTER — Inpatient Hospital Stay: Payer: PPO

## 2020-05-25 VITALS — BP 159/79 | HR 84 | Temp 97.7°F | Resp 16

## 2020-05-25 DIAGNOSIS — C787 Secondary malignant neoplasm of liver and intrahepatic bile duct: Secondary | ICD-10-CM

## 2020-05-25 DIAGNOSIS — Z7189 Other specified counseling: Secondary | ICD-10-CM

## 2020-05-25 DIAGNOSIS — C3490 Malignant neoplasm of unspecified part of unspecified bronchus or lung: Secondary | ICD-10-CM

## 2020-05-25 DIAGNOSIS — Z5112 Encounter for antineoplastic immunotherapy: Secondary | ICD-10-CM | POA: Diagnosis not present

## 2020-05-25 LAB — T4: T4, Total: 7.6 ug/dL (ref 4.5–12.0)

## 2020-05-25 MED ORDER — PROCHLORPERAZINE EDISYLATE 10 MG/2ML IJ SOLN
INTRAMUSCULAR | Status: AC
  Filled 2020-05-25: qty 2

## 2020-05-25 MED ORDER — DIPHENHYDRAMINE HCL 50 MG/ML IJ SOLN
50.0000 mg | Freq: Once | INTRAMUSCULAR | Status: AC | PRN
  Administered 2020-05-25: 25 mg via INTRAVENOUS

## 2020-05-25 MED ORDER — FAMOTIDINE IN NACL 20-0.9 MG/50ML-% IV SOLN
20.0000 mg | Freq: Once | INTRAVENOUS | Status: AC
  Administered 2020-05-25: 20 mg via INTRAVENOUS

## 2020-05-25 MED ORDER — FAMOTIDINE IN NACL 20-0.9 MG/50ML-% IV SOLN
20.0000 mg | Freq: Once | INTRAVENOUS | Status: AC | PRN
  Administered 2020-05-25: 20 mg via INTRAVENOUS

## 2020-05-25 MED ORDER — FAMOTIDINE IN NACL 20-0.9 MG/50ML-% IV SOLN
INTRAVENOUS | Status: AC
  Filled 2020-05-25: qty 50

## 2020-05-25 MED ORDER — DIPHENHYDRAMINE HCL 50 MG/ML IJ SOLN
INTRAMUSCULAR | Status: AC
  Filled 2020-05-25: qty 1

## 2020-05-25 MED ORDER — SODIUM CHLORIDE 0.9 % IV SOLN
Freq: Once | INTRAVENOUS | Status: AC
  Filled 2020-05-25: qty 250

## 2020-05-25 MED ORDER — DIPHENHYDRAMINE HCL 50 MG/ML IJ SOLN
50.0000 mg | Freq: Once | INTRAMUSCULAR | Status: AC
  Administered 2020-05-25: 50 mg via INTRAVENOUS

## 2020-05-25 MED ORDER — SODIUM CHLORIDE 0.9 % IV SOLN
135.0000 mg/m2 | Freq: Once | INTRAVENOUS | Status: AC
  Administered 2020-05-25: 222 mg via INTRAVENOUS
  Filled 2020-05-25: qty 37

## 2020-05-25 MED ORDER — PALONOSETRON HCL INJECTION 0.25 MG/5ML
0.2500 mg | Freq: Once | INTRAVENOUS | Status: AC
  Administered 2020-05-25: 0.25 mg via INTRAVENOUS

## 2020-05-25 MED ORDER — PALONOSETRON HCL INJECTION 0.25 MG/5ML
INTRAVENOUS | Status: AC
  Filled 2020-05-25: qty 5

## 2020-05-25 MED ORDER — SODIUM CHLORIDE 0.9 % IV SOLN
150.0000 mg | Freq: Once | INTRAVENOUS | Status: AC
  Administered 2020-05-25: 150 mg via INTRAVENOUS
  Filled 2020-05-25: qty 150

## 2020-05-25 MED ORDER — SODIUM CHLORIDE 0.9 % IV SOLN
200.0000 mg | Freq: Once | INTRAVENOUS | Status: AC
  Administered 2020-05-25: 200 mg via INTRAVENOUS
  Filled 2020-05-25: qty 8

## 2020-05-25 MED ORDER — SODIUM CHLORIDE 0.9 % IV SOLN
10.0000 mg | Freq: Once | INTRAVENOUS | Status: AC
  Administered 2020-05-25: 10 mg via INTRAVENOUS
  Filled 2020-05-25: qty 10

## 2020-05-25 MED ORDER — SODIUM CHLORIDE 0.9 % IV SOLN
237.6000 mg | Freq: Once | INTRAVENOUS | Status: AC
  Administered 2020-05-25: 240 mg via INTRAVENOUS
  Filled 2020-05-25: qty 24

## 2020-05-25 MED ORDER — PROCHLORPERAZINE EDISYLATE 10 MG/2ML IJ SOLN
10.0000 mg | Freq: Once | INTRAMUSCULAR | Status: AC
  Administered 2020-05-25: 10 mg via INTRAVENOUS

## 2020-05-25 NOTE — Patient Instructions (Signed)
[facility] Discharge Instructions for Patients Receiving Chemotherapy  Today you received the following chemotherapy agents:  Keytruda, Taxol, Carbo  To help prevent nausea and vomiting after your treatment, we encourage you to take your nausea medication as prescribed.   If you develop nausea and vomiting that is not controlled by your nausea medication, call the clinic.   BELOW ARE SYMPTOMS THAT SHOULD BE REPORTED IMMEDIATELY:  *FEVER GREATER THAN 100.5 F  *CHILLS WITH OR WITHOUT FEVER  NAUSEA AND VOMITING THAT IS NOT CONTROLLED WITH YOUR NAUSEA MEDICATION  *UNUSUAL SHORTNESS OF BREATH  *UNUSUAL BRUISING OR BLEEDING  TENDERNESS IN MOUTH AND THROAT WITH OR WITHOUT PRESENCE OF ULCERS  *URINARY PROBLEMS  *BOWEL PROBLEMS  UNUSUAL RASH Items with * indicate a potential emergency and should be followed up as soon as possible.  Feel free to call the clinic should you have any questions or concerns. The clinic phone number is [phone].  Please show the [facility] at check-in to the Emergency Department and triage nurse.    Carboplatin injection What is this medicine? CARBOPLATIN (KAR boe pla tin) is a chemotherapy drug. It targets fast dividing cells, like cancer cells, and causes these cells to die. This medicine is used to treat ovarian cancer and many other cancers. This medicine may be used for other purposes; ask your health care provider or pharmacist if you have questions. COMMON BRAND NAME(S): Paraplatin What should I tell my health care provider before I take this medicine? They need to know if you have any of these conditions:  blood disorders  hearing problems  kidney disease  recent or ongoing radiation therapy  an unusual or allergic reaction to carboplatin, cisplatin, other chemotherapy, other medicines, foods, dyes, or preservatives  pregnant or trying to get pregnant  breast-feeding How should I use this medicine? This  drug is usually given as an infusion into a vein. It is administered in a hospital or clinic by a specially trained health care professional. Talk to your pediatrician regarding the use of this medicine in children. Special care may be needed. Overdosage: If you think you have taken too much of this medicine contact a poison control center or emergency room at once. NOTE: This medicine is only for you. Do not share this medicine with others. What if I miss a dose? It is important not to miss a dose. Call your doctor or health care professional if you are unable to keep an appointment. What may interact with this medicine?  medicines for seizures  medicines to increase blood counts like filgrastim, pegfilgrastim, sargramostim  some antibiotics like amikacin, gentamicin, neomycin, streptomycin, tobramycin  vaccines Talk to your doctor or health care professional before taking any of these medicines:  acetaminophen  aspirin  ibuprofen  ketoprofen  naproxen This list may not describe all possible interactions. Give your health care provider a list of all the medicines, herbs, non-prescription drugs, or dietary supplements you use. Also tell them if you smoke, drink alcohol, or use illegal drugs. Some items may interact with your medicine. What should I watch for while using this medicine? Your condition will be monitored carefully while you are receiving this medicine. You will need important blood work done while you are taking this medicine. This drug may make you feel generally unwell. This is not uncommon, as chemotherapy can affect healthy cells as well as cancer cells. Report any side effects. Continue your course of treatment even though you feel ill unless your  doctor tells you to stop. In some cases, you may be given additional medicines to help with side effects. Follow all directions for their use. Call your doctor or health care professional for advice if you get a fever,  chills or sore throat, or other symptoms of a cold or flu. Do not treat yourself. This drug decreases your body's ability to fight infections. Try to avoid being around people who are sick. This medicine may increase your risk to bruise or bleed. Call your doctor or health care professional if you notice any unusual bleeding. Be careful brushing and flossing your teeth or using a toothpick because you may get an infection or bleed more easily. If you have any dental work done, tell your dentist you are receiving this medicine. Avoid taking products that contain aspirin, acetaminophen, ibuprofen, naproxen, or ketoprofen unless instructed by your doctor. These medicines may hide a fever. Do not become pregnant while taking this medicine. Women should inform their doctor if they wish to become pregnant or think they might be pregnant. There is a potential for serious side effects to an unborn child. Talk to your health care professional or pharmacist for more information. Do not breast-feed an infant while taking this medicine. What side effects may I notice from receiving this medicine? Side effects that you should report to your doctor or health care professional as soon as possible:  allergic reactions like skin rash, itching or hives, swelling of the face, lips, or tongue  signs of infection - fever or chills, cough, sore throat, pain or difficulty passing urine  signs of decreased platelets or bleeding - bruising, pinpoint red spots on the skin, black, tarry stools, nosebleeds  signs of decreased red blood cells - unusually weak or tired, fainting spells, lightheadedness  breathing problems  changes in hearing  changes in vision  chest pain  high blood pressure  low blood counts - This drug may decrease the number of white blood cells, red blood cells and platelets. You may be at increased risk for infections and bleeding.  nausea and vomiting  pain, swelling, redness or irritation at  the injection site  pain, tingling, numbness in the hands or feet  problems with balance, talking, walking  trouble passing urine or change in the amount of urine Side effects that usually do not require medical attention (report to your doctor or health care professional if they continue or are bothersome):  hair loss  loss of appetite  metallic taste in the mouth or changes in taste This list may not describe all possible side effects. Call your doctor for medical advice about side effects. You may report side effects to FDA at 1-800-FDA-1088. Where should I keep my medicine? This drug is given in a hospital or clinic and will not be stored at home. NOTE: This sheet is a summary. It may not cover all possible information. If you have questions about this medicine, talk to your doctor, pharmacist, or health care provider.  2020 Elsevier/Gold Standard (2007-09-20 14:38:05)   Paclitaxel injection What is this medicine? PACLITAXEL (PAK li TAX el) is a chemotherapy drug. It targets fast dividing cells, like cancer cells, and causes these cells to die. This medicine is used to treat ovarian cancer, breast cancer, lung cancer, Kaposi's sarcoma, and other cancers. This medicine may be used for other purposes; ask your health care provider or pharmacist if you have questions. COMMON BRAND NAME(S): Onxol, Taxol What should I tell my health care provider before I  take this medicine? They need to know if you have any of these conditions:  history of irregular heartbeat  liver disease  low blood counts, like low white cell, platelet, or red cell counts  lung or breathing disease, like asthma  tingling of the fingers or toes, or other nerve disorder  an unusual or allergic reaction to paclitaxel, alcohol, polyoxyethylated castor oil, other chemotherapy, other medicines, foods, dyes, or preservatives  pregnant or trying to get pregnant  breast-feeding How should I use this  medicine? This drug is given as an infusion into a vein. It is administered in a hospital or clinic by a specially trained health care professional. Talk to your pediatrician regarding the use of this medicine in children. Special care may be needed. Overdosage: If you think you have taken too much of this medicine contact a poison control center or emergency room at once. NOTE: This medicine is only for you. Do not share this medicine with others. What if I miss a dose? It is important not to miss your dose. Call your doctor or health care professional if you are unable to keep an appointment. What may interact with this medicine? Do not take this medicine with any of the following medications:  disulfiram  metronidazole This medicine may also interact with the following medications:  antiviral medicines for hepatitis, HIV or AIDS  certain antibiotics like erythromycin and clarithromycin  certain medicines for fungal infections like ketoconazole and itraconazole  certain medicines for seizures like carbamazepine, phenobarbital, phenytoin  gemfibrozil  nefazodone  rifampin  St. John's wort This list may not describe all possible interactions. Give your health care provider a list of all the medicines, herbs, non-prescription drugs, or dietary supplements you use. Also tell them if you smoke, drink alcohol, or use illegal drugs. Some items may interact with your medicine. What should I watch for while using this medicine? Your condition will be monitored carefully while you are receiving this medicine. You will need important blood work done while you are taking this medicine. This medicine can cause serious allergic reactions. To reduce your risk you will need to take other medicine(s) before treatment with this medicine. If you experience allergic reactions like skin rash, itching or hives, swelling of the face, lips, or tongue, tell your doctor or health care professional right  away. In some cases, you may be given additional medicines to help with side effects. Follow all directions for their use. This drug may make you feel generally unwell. This is not uncommon, as chemotherapy can affect healthy cells as well as cancer cells. Report any side effects. Continue your course of treatment even though you feel ill unless your doctor tells you to stop. Call your doctor or health care professional for advice if you get a fever, chills or sore throat, or other symptoms of a cold or flu. Do not treat yourself. This drug decreases your body's ability to fight infections. Try to avoid being around people who are sick. This medicine may increase your risk to bruise or bleed. Call your doctor or health care professional if you notice any unusual bleeding. Be careful brushing and flossing your teeth or using a toothpick because you may get an infection or bleed more easily. If you have any dental work done, tell your dentist you are receiving this medicine. Avoid taking products that contain aspirin, acetaminophen, ibuprofen, naproxen, or ketoprofen unless instructed by your doctor. These medicines may hide a fever. Do not become pregnant while taking  this medicine. Women should inform their doctor if they wish to become pregnant or think they might be pregnant. There is a potential for serious side effects to an unborn child. Talk to your health care professional or pharmacist for more information. Do not breast-feed an infant while taking this medicine. Men are advised not to father a child while receiving this medicine. This product may contain alcohol. Ask your pharmacist or healthcare provider if this medicine contains alcohol. Be sure to tell all healthcare providers you are taking this medicine. Certain medicines, like metronidazole and disulfiram, can cause an unpleasant reaction when taken with alcohol. The reaction includes flushing, headache, nausea, vomiting, sweating, and  increased thirst. The reaction can last from 30 minutes to several hours. What side effects may I notice from receiving this medicine? Side effects that you should report to your doctor or health care professional as soon as possible:  allergic reactions like skin rash, itching or hives, swelling of the face, lips, or tongue  breathing problems  changes in vision  fast, irregular heartbeat  high or low blood pressure  mouth sores  pain, tingling, numbness in the hands or feet  signs of decreased platelets or bleeding - bruising, pinpoint red spots on the skin, black, tarry stools, blood in the urine  signs of decreased red blood cells - unusually weak or tired, feeling faint or lightheaded, falls  signs of infection - fever or chills, cough, sore throat, pain or difficulty passing urine  signs and symptoms of liver injury like dark yellow or brown urine; general ill feeling or flu-like symptoms; light-colored stools; loss of appetite; nausea; right upper belly pain; unusually weak or tired; yellowing of the eyes or skin  swelling of the ankles, feet, hands  unusually slow heartbeat Side effects that usually do not require medical attention (report to your doctor or health care professional if they continue or are bothersome):  diarrhea  hair loss  loss of appetite  muscle or joint pain  nausea, vomiting  pain, redness, or irritation at site where injected  tiredness This list may not describe all possible side effects. Call your doctor for medical advice about side effects. You may report side effects to FDA at 1-800-FDA-1088. Where should I keep my medicine? This drug is given in a hospital or clinic and will not be stored at home. NOTE: This sheet is a summary. It may not cover all possible information. If you have questions about this medicine, talk to your doctor, pharmacist, or health care provider.  2020 Elsevier/Gold Standard (2017-02-16 13:14:55)

## 2020-05-25 NOTE — Progress Notes (Signed)
Patient discharged to home alert, oriented, ambulatory.  VSS

## 2020-05-25 NOTE — Progress Notes (Signed)
0927 pt states she is having difficulty swallowing.   Emend stopped and NS given to gravity flow. VSS Sandi Mealy PA to chairside. 20mg  IV Pepcid and IV compazine given per order. Emend discontinued.  Keytruda started.  1039 pt states difficulty swallowing during Keytruda infusion. Keytruda stopped. 25 IV benadryl given per Lucianne Lei. Pt restless and wanted to get up and move around. Pt ambulated unit with assistance of nurse without any difficulty. [facility] resumed and completed without any further complication. Pt able to swallow appropriately and sip on her drink.

## 2020-05-25 NOTE — Progress Notes (Signed)
In basket message sent to Dr [name] and Sherren Mocha to notify of need for f/u appt w/ Dr [name]

## 2020-05-27 ENCOUNTER — Inpatient Hospital Stay: Payer: PPO

## 2020-05-27 ENCOUNTER — Other Ambulatory Visit: Payer: Self-pay

## 2020-05-27 VITALS — BP 141/81 | HR 87 | Temp 97.9°F | Resp 18

## 2020-05-27 DIAGNOSIS — Z7189 Other specified counseling: Secondary | ICD-10-CM

## 2020-05-27 DIAGNOSIS — C787 Secondary malignant neoplasm of liver and intrahepatic bile duct: Secondary | ICD-10-CM

## 2020-05-27 DIAGNOSIS — Z5112 Encounter for antineoplastic immunotherapy: Secondary | ICD-10-CM | POA: Diagnosis not present

## 2020-05-27 MED ORDER — PEGFILGRASTIM-JMDB 6 MG/0.6ML ~~LOC~~ SOSY
PREFILLED_SYRINGE | SUBCUTANEOUS | Status: AC
  Filled 2020-05-27: qty 0.6

## 2020-05-27 MED ORDER — PEGFILGRASTIM-JMDB 6 MG/0.6ML ~~LOC~~ SOSY
6.0000 mg | PREFILLED_SYRINGE | Freq: Once | SUBCUTANEOUS | Status: AC
  Administered 2020-05-27: 6 mg via SUBCUTANEOUS

## 2020-05-27 NOTE — Patient Instructions (Signed)

## 2020-05-28 ENCOUNTER — Telehealth: Payer: Self-pay | Admitting: *Deleted

## 2020-05-28 NOTE — Telephone Encounter (Signed)
Received Telephone Advice fax from after hours [facility]: Fax dated [phone] Pm.  Patient called evening 05/26/20 with concern:  Noted by Romie Jumper, RN - "facial rash (cheeks and chin). Slightly raised, itchy small spots and dots, pink. She has placed anti-itch cream on the rash and she has taken benadryl." Was advised to use Hydrocortisone Cream 1% for itching - apply to area 3 x day. Recommended to contact PCP. Contacted patient by phone to assess - LVM acknowledging information in fax and to contact office as needed.

## 2020-05-30 ENCOUNTER — Ambulatory Visit: Payer: PPO | Admitting: Physician Assistant

## 2020-06-03 ENCOUNTER — Other Ambulatory Visit: Payer: Self-pay

## 2020-06-03 ENCOUNTER — Inpatient Hospital Stay: Payer: PPO | Attending: Hematology

## 2020-06-03 ENCOUNTER — Inpatient Hospital Stay: Payer: PPO | Admitting: Hematology

## 2020-06-03 ENCOUNTER — Inpatient Hospital Stay: Payer: PPO

## 2020-06-03 VITALS — HR 90 | Temp 96.9°F | Resp 20 | Ht 59.0 in | Wt 141.3 lb

## 2020-06-03 DIAGNOSIS — C3411 Malignant neoplasm of upper lobe, right bronchus or lung: Secondary | ICD-10-CM | POA: Diagnosis not present

## 2020-06-03 DIAGNOSIS — J449 Chronic obstructive pulmonary disease, unspecified: Secondary | ICD-10-CM | POA: Insufficient documentation

## 2020-06-03 DIAGNOSIS — Z5112 Encounter for antineoplastic immunotherapy: Secondary | ICD-10-CM | POA: Insufficient documentation

## 2020-06-03 DIAGNOSIS — C3490 Malignant neoplasm of unspecified part of unspecified bronchus or lung: Secondary | ICD-10-CM | POA: Diagnosis not present

## 2020-06-03 DIAGNOSIS — E46 Unspecified protein-calorie malnutrition: Secondary | ICD-10-CM | POA: Insufficient documentation

## 2020-06-03 DIAGNOSIS — Z23 Encounter for immunization: Secondary | ICD-10-CM | POA: Diagnosis not present

## 2020-06-03 DIAGNOSIS — I428 Other cardiomyopathies: Secondary | ICD-10-CM | POA: Insufficient documentation

## 2020-06-03 DIAGNOSIS — Z79899 Other long term (current) drug therapy: Secondary | ICD-10-CM | POA: Insufficient documentation

## 2020-06-03 DIAGNOSIS — Z5111 Encounter for antineoplastic chemotherapy: Secondary | ICD-10-CM | POA: Insufficient documentation

## 2020-06-03 DIAGNOSIS — Z5189 Encounter for other specified aftercare: Secondary | ICD-10-CM | POA: Diagnosis not present

## 2020-06-03 DIAGNOSIS — Z7189 Other specified counseling: Secondary | ICD-10-CM

## 2020-06-03 DIAGNOSIS — C787 Secondary malignant neoplasm of liver and intrahepatic bile duct: Secondary | ICD-10-CM

## 2020-06-03 DIAGNOSIS — E876 Hypokalemia: Secondary | ICD-10-CM | POA: Insufficient documentation

## 2020-06-03 DIAGNOSIS — I10 Essential (primary) hypertension: Secondary | ICD-10-CM | POA: Insufficient documentation

## 2020-06-03 DIAGNOSIS — I509 Heart failure, unspecified: Secondary | ICD-10-CM | POA: Insufficient documentation

## 2020-06-03 LAB — CMP (CANCER CENTER ONLY)
ALT: 39 U/L (ref 0–44)
AST: 46 U/L — ABNORMAL HIGH (ref 15–41)
Albumin: 3.5 g/dL (ref 3.5–5.0)
Alkaline Phosphatase: 336 U/L — ABNORMAL HIGH (ref 38–126)
Anion gap: 10 (ref 5–15)
BUN: 17 mg/dL (ref 8–23)
CO2: 25 mmol/L (ref 22–32)
Calcium: 9.5 mg/dL (ref 8.9–10.3)
Chloride: 102 mmol/L (ref 98–111)
Creatinine: 1.26 mg/dL — ABNORMAL HIGH (ref 0.44–1.00)
GFR, Estimated: 45 mL/min — ABNORMAL LOW (ref >60)
Glucose, Bld: 104 mg/dL — ABNORMAL HIGH (ref 70–99)
Potassium: 4.4 mmol/L (ref 3.5–5.1)
Sodium: 137 mmol/L (ref 135–145)
Total Bilirubin: 0.2 mg/dL — ABNORMAL LOW (ref 0.3–1.2)
Total Protein: 7.3 g/dL (ref 6.5–8.1)

## 2020-06-03 LAB — CBC WITH DIFFERENTIAL (CANCER CENTER ONLY)
Abs Immature Granulocytes: 0.2 10*3/uL — ABNORMAL HIGH (ref 0.00–0.07)
Band Neutrophils: 4 %
Basophils Absolute: 0 10*3/uL (ref 0.0–0.1)
Basophils Relative: 0 %
Eosinophils Absolute: 0 10*3/uL (ref 0.0–0.5)
Eosinophils Relative: 0 %
HCT: 37.1 % (ref 36.0–46.0)
Hemoglobin: 11.1 g/dL — ABNORMAL LOW (ref 12.0–15.0)
Lymphocytes Relative: 5 %
Lymphs Abs: 1.2 10*3/uL (ref 0.7–4.0)
MCH: 26.1 pg (ref 26.0–34.0)
MCHC: 29.9 g/dL — ABNORMAL LOW (ref 30.0–36.0)
MCV: 87.3 fL (ref 80.0–100.0)
Metamyelocytes Relative: 1 %
Monocytes Absolute: 1 10*3/uL (ref 0.1–1.0)
Monocytes Relative: 4 %
Neutro Abs: 22.1 10*3/uL — ABNORMAL HIGH (ref 1.7–7.7)
Neutrophils Relative %: 86 %
Platelet Count: 319 10*3/uL (ref 150–400)
RBC: 4.25 MIL/uL (ref 3.87–5.11)
RDW: 15.5 % (ref 11.5–15.5)
WBC Count: 24.5 10*3/uL — ABNORMAL HIGH (ref 4.0–10.5)
nRBC: 0.2 % (ref 0.0–0.2)

## 2020-06-03 LAB — TSH: TSH: 2.215 u[IU]/mL (ref 0.308–3.960)

## 2020-06-03 MED ORDER — TRAMADOL HCL 50 MG PO TABS
50.0000 mg | ORAL_TABLET | Freq: Four times a day (QID) | ORAL | 0 refills | Status: DC | PRN
Start: 2020-06-03 — End: 2020-07-05

## 2020-06-03 NOTE — Progress Notes (Signed)
HEMATOLOGY/ONCOLOGY CLINIC NOTE  Date of Service: 06/03/2020  Patient Care Team: Lorrene Reid, PA-C as PCP - General (Physician Assistant)  CHIEF COMPLAINTS/PURPOSE OF CONSULTATION:   F/u for recurent squamous cell lung cancer   HISTORY OF PRESENTING ILLNESS:    Chelsea Baker is a wonderful 74 y.o. female who has been referred to [place] by Dr .Sloan Leiter, Henreitta Leber, MD  for evaluation and management of newly diagnosed Squmous cell carcinoma of the RUL.  Patient has a history of hypertension, nicotine dependence, GERD, hiatal hernia, AAA and presented with a 2-week history of cough, weight loss about 20 pounds in about 6 months and increasing shortness of breath over the last several days prior to admission.  In the ED the patient had a chest x-ray which showed a 3.4 cm Which showed a 3.4 cm right upper lobe mass which appeared cavitary.  Subsequent CT of the chest showed a 3.5 cm cavitary mass involving the right upper lobe concerning for a primary bronchogenic carcinoma.  She was also noted to have a 4 mm nodule which is indeterminate in the right lower lobe.  No evidence of metastatic disease noted in the chest or upper abdomen. No evidence of pulmonary embolism.  Patient subsequently had a CT-guided biopsy of the right upper lobe lung lesion on 07/15/2017.  Pathology revealed squamous cell carcinoma of the lung. She had other workup including an AFB smear which was negative.  Patient notes that she does not really have a primary care physician and has not had good medical follow-up and does not really see a doctor regularly. She has had a history of smoking 1/2-2 packs/day for 50 years.  No diagnosed COPD though likely has significant decline in lung function at baseline. Currently was short of breath despite lung cancer not involving a large part of the lung at this time and no evidence of PE.  Still needing 2-3 L of oxygen by nasal cannula currently and endorses shortness of  breath with minimal walking.  She notes that she lives with her son.   INTERVAL HISTORY:  [name] is here regarding her lung cancer. She is here today for a toxicity check after C1D1 Carboplatin/Taxol/Pembrolizumab. The patient's last visit with [place] was on 05/13/2020. The pt reports that she is doing well overall.  The pt reports that she had a small rash across her cheeks that resolved with an antihistamine. Pt also notes significant bone pain after receiving her G-CSF injection. She feels that she tolerated treatment well overall. She denies any issues tolerating the first COVID19 vaccine.  Lab results today (06/03/20) of CBC w/diff and CMP is as follows: all values are WNL except for WBC at 24.5K, Hgb at 11.1, MCHC at 29.9, Neutro Abs at 22.1K, Abs Immature Granulocytes at 0.20K, Glucose at 104, Creatinine at 1.26, AST at 46, ALP at 336, Total Bilirubin at 0.2, GFR Est at 45. 06/03/2020 TSH at 2.215 06/03/2020 T4 free is in progress  On review of systems, pt denies mouth sores, N/V/D, rash and any other symptoms.   MEDICAL HISTORY:  Past Medical History:  Diagnosis Date  . AAA (abdominal aortic aneurysm) ([place])   . Diverticulosis   . GERD (gastroesophageal reflux disease)   . Hiatal hernia   . Hypertension   . Hypokalemia   . Lumbar spondylolysis   . Lung cancer ([place]) dx'd 2019  . Lung tumor   . On home O2    2L N/C    SURGICAL HISTORY:  Past Surgical History:  Procedure Laterality Date  . ABDOMINAL AORTIC ANEURYSM REPAIR  10/01/10   endostent repair  . CATARACT EXTRACTION, BILATERAL    . LUNG BIOPSY    . NM MYOVIEW LTD  2012   Normal Lexiscan Myoview with no ischemia or infarction.  Normal LV function.  EF 67%.  . TONSILLECTOMY    . TRANSTHORACIC ECHOCARDIOGRAM  07/17/2017   EF 35-40%.  Septal mid and basal inferior wall hypokinesis.  Mildly dilated LV.  Mildly reduced EF.  GR 1 DD.  Calcified mitral annulus.     SOCIAL HISTORY: Social History    Socioeconomic History  . Marital status: Single    Spouse name: Not on file  . Number of children: Not on file  . Years of education: Not on file  . Highest education level: Not on file  Occupational History  . Not on file  Tobacco Use  . Smoking status: Former Smoker    Packs/day: 1.00    Years: 50.00    Pack years: 50.00    Types: Cigarettes    Quit date: 06/2017    Years since quitting: 2.9  . Smokeless tobacco: Never Used  Vaping Use  . Vaping Use: Never used  Substance and Sexual Activity  . Alcohol use: Yes    Comment: rarely  . Drug use: No  . Sexual activity: Not Currently  Other Topics Concern  . Not on file  Social History Narrative  . Not on file   Social Determinants of Health   Financial Resource Strain:   . Difficulty of Paying Living Expenses: Not on file  Food Insecurity:   . Worried About Charity fundraiser in the Last Year: Not on file  . Ran Out of Food in the Last Year: Not on file  Transportation Needs:   . Lack of Transportation (Medical): Not on file  . Lack of Transportation (Non-Medical): Not on file  Physical Activity:   . Days of Exercise per Week: Not on file  . Minutes of Exercise per Session: Not on file  Stress:   . Feeling of Stress : Not on file  Social Connections:   . Frequency of Communication with Friends and Family: Not on file  . Frequency of Social Gatherings with Friends and Family: Not on file  . Attends Religious Services: Not on file  . Active Member of Clubs or Organizations: Not on file  . Attends Archivist Meetings: Not on file  . Marital Status: Not on file  Intimate Partner Violence:   . Fear of Current or Ex-Partner: Not on file  . Emotionally Abused: Not on file  . Physically Abused: Not on file  . Sexually Abused: Not on file    FAMILY HISTORY: Family History  Problem Relation Age of Onset  . Heart failure Mother   . Heart attack Mother 15       In 46s - 1st MI  . Coronary artery  disease Mother   . Coronary artery disease Father        Died at age 79  . Heart attack Father 62  . Heart disease Maternal Grandmother   . Heart disease Maternal Grandfather   . Cancer Neg Hx     ALLERGIES:  has No Known Allergies.  MEDICATIONS:  Current Outpatient Medications  Medication Sig Dispense Refill  . acetaminophen (TYLENOL) 500 MG tablet Take 500 mg by mouth every 6 (six) hours as needed.    Marland Kitchen albuterol (VENTOLIN HFA)  108 (90 Base) MCG/ACT inhaler INHALE 2 PUFFS INTO THE LUNGS EVERY 4 (FOUR) HOURS AS NEEDED FOR WHEEZING OR SHORTNESS OF BREATH. 18 g 5  . Baclofen 5 MG TABS Take 1 tablet by mouth 2 (two) times daily as needed. 60 tablet 0  . budesonide (PULMICORT) 0.5 MG/2ML nebulizer solution Take 2 mLs (0.5 mg total) by nebulization 2 (two) times daily. 120 mL 5  . dexamethasone (DECADRON) 4 MG tablet Take 2 tablets (8 mg total) by mouth daily. Start the day after carboplatin chemotherapy for 3 days. 30 tablet 1  . fexofenadine (ALLEGRA) 180 MG tablet Take 1 tablet (180 mg total) by mouth daily. 30 tablet 1  . ipratropium-albuterol (DUONEB) 0.5-2.5 (3) MG/3ML SOLN Take 3 mLs by nebulization every 4 (four) hours as needed. 90 mL 0  . LORazepam (ATIVAN) 0.5 MG tablet Take 1 tablet (0.5 mg total) by mouth every 6 (six) hours as needed (Nausea or vomiting). 30 tablet 0  . metoprolol succinate (TOPROL-XL) 50 MG 24 hr tablet TAKE 1 TAB DAILY. TAKE WITH OR IMMEDIATELY FOLLOWING A MEAL.**PATIENT WILL NEED TO FOLLOW UP WITH MD 60 tablet 0  . ondansetron (ZOFRAN) 8 MG tablet Take 1 tablet (8 mg total) by mouth 2 (two) times daily as needed for refractory nausea / vomiting. Start on day 3 after carboplatin chemo. 30 tablet 1  . prochlorperazine (COMPAZINE) 10 MG tablet Take 1 tablet (10 mg total) by mouth every 6 (six) hours as needed (Nausea or vomiting). 30 tablet 1   No current facility-administered medications for this visit.    REVIEW OF SYSTEMS:   A 10+ POINT REVIEW OF SYSTEMS  WAS OBTAINED including neurology, dermatology, psychiatry, cardiac, respiratory, lymph, extremities, GI, GU, Musculoskeletal, constitutional, breasts, reproductive, HEENT.  All pertinent positives are noted in the HPI.  All others are negative.   PHYSICAL EXAMINATION: There were no vitals filed for this visit. Wt Readings from Last 3 Encounters:  05/13/20 141 lb 4.8 oz (64.1 kg)  04/15/20 143 lb 3.2 oz (65 kg)  03/18/20 142 lb 11.2 oz (64.7 kg)   There is no height or weight on file to calculate BMI.    GENERAL:alert, in no acute distress and comfortable SKIN: no acute rashes, no significant lesions EYES: conjunctiva are pink and non-injected, sclera anicteric OROPHARYNX: MMM, no exudates, no oropharyngeal erythema or ulceration NECK: supple, no JVD LYMPH:  no palpable lymphadenopathy in the cervical, axillary or inguinal regions LUNGS: clear to auscultation b/l with normal respiratory effort HEART: regular rate & rhythm ABDOMEN:  normoactive bowel sounds , non tender, not distended. No palpable hepatosplenomegaly.  Extremity: no pedal edema PSYCH: alert & oriented x 3 with fluent speech NEURO: no focal motor/sensory deficits  LABORATORY DATA:  I have reviewed the data as listed  . CBC Latest Ref Rng & Units 05/24/2020 05/03/2020 04/15/2020  WBC 4.0 - 10.5 K/uL 6.8 4.9 5.7  Hemoglobin 12.0 - 15.0 g/dL 12.2 12.5 12.3  Hematocrit 36 - 46 % 40.7 41.3 40.2  Platelets 150 - 400 K/uL 305 299 303    . CMP Latest Ref Rng & Units 05/24/2020 05/03/2020 04/15/2020  Glucose 70 - 99 mg/dL 111(H) 102(H) 104(H)  BUN 8 - 23 mg/dL 18 19 19   Creatinine 0.44 - 1.00 mg/dL 1.45(H) 1.38(H) 1.30(H)  Sodium 135 - 145 mmol/L 134(L) 137 135  Potassium 3.5 - 5.1 mmol/L 4.6 4.4 5.0  Chloride 98 - 111 mmol/L 99 100 101  CO2 22 - 32 mmol/L 23 25 27  Calcium 8.9 - 10.3 mg/dL 9.7 9.5 10.0  Total Protein 6.5 - 8.1 g/dL 7.3 7.9 7.5  Total Bilirubin 0.3 - 1.2 mg/dL 0.6 0.6 0.2(L)  Alkaline Phos 38 - 126  U/L 320(H) 133(H) 143(H)  AST 15 - 41 U/L 91(H) 46(H) 37  ALT 0 - 44 U/L 68(H) 37 30   05/03/2020 Surgical Pathology Report ([id number]):    PATHOLOGY      RADIOGRAPHIC STUDIES: I have personally reviewed the radiological images as listed and agreed with the findings in the report. No results found.  ASSESSMENT & PLAN:   LURIA ROSARIO is a 74 y.o. female with   1) Metastatic lung squamous cell carcinoma Previous h/o right upper lobe squamous cell carcinoma of the lung. Stage I Presenting with a cavitary RUL mass.  Biopsy proven to be squamous cell carcinoma. No overt mediastinal or hilar lymphadenopathy noted. PET/CT showed no overt metastatic disease CT head neg for mets. Patient declined MRI S/p SBRT 60 Gy in 5 fractions 09/01/17 to 09/10/17  CT Chest 10/22/17 shows improvement in the right upper lobe mass after [facility]  03/01/18 CT Chest revealed Continued reduction in spiculated posterior right upper lobe pulmonary nodule. 2. Left upper lobe 1.6 cm spiculated pulmonary nodule is increased in size and density, compatible with malignancy, either a second primary bronchogenic carcinoma or contralateral metastasis. 3. No thoracic adenopathy.  03/18/18 PET/CT revealed Intense metabolic activity associated with enlarging LEFT upper lobe pulmonary nodule is most suggestive primary bronchogenic Carcinoma. 2. Mild to moderate activity associated treated pulmonary nodule in the RIGHT upper lobe with associated moderate metabolic activity in adjacent chest wall favored post radiation change inflammation. 3. Stable additional RIGHT lower lobe pulmonary nodule. 4. No new pulmonary nodules evident. 5. No evidence of metastatic mediastinal adenopathy or distant disease.  07/12/18 CT Chest which revealed 2.2 x 1.2 cm irregular/spiculated posterior right upper lobe nodule, grossly unchanged. 5 x 10 mm left upper lobe nodule, decreased. Aortic Atherosclerosis and Emphysema.  05/16/2019 CT Chest Wo  Contrast (Accession [id number]) which revealed "Radiation changes in the posterior right upper lobe and left upper lobe/lingula.10 mm lingular nodule, progressive, suspicious for recurrence."  09/07/2019 CT Chest ([id number]) revealed "1. Recently treated lingular pulmonary nodule is mildly decreased in size. 2. Masslike fibrosis in the posterior right upper lobe is mildly increased, equivocal for continued evolution of postradiation change versus early recurrence. 3. Otherwise no new or progressive metastatic disease in the chest."  04/10/2020 PET/CT ([id number]) revealed "1. Local hypermetabolic nodal metastatic recurrence in the prevascular nodal station and LEFT hilum. 2. Bands of fibrotic change and consolidation in LEFT upper lobe and RIGHT upper lobe with low metabolic activity are favored post therapy benign findings. 3. Unfortunately, distant metastatic disease with new multifocal hypermetabolic HEPATIC METASTASIS."   2) Now with Left upper lobe 1.6 cm spiculated pulmonary nodule- increasing in size. Seen on the 03/18/18 PET/CT as noted above. Newly identified Left enlarging nodule is suspected to be a new primary, and would be considered a new Stage I tumor S/p 54 Gy in 3 fractions 04/06/18 to 04/13/18  3) Dyspnea and rest and on minimal exertion. - much improved CT chest with no PE  significant COPD at baseline -  Needing 2-3 L of oxygen by nasal cannula in hospital. and is now off oxygen ECHO ef 35-40% -Pulmonary function testing -severe obstruction - optimize rx -noted to have systolic cardiomyopathy - mx Dr Ellyn Hack. -Continue follow up with Dr. Sherrilyn Rist in Pulmonology   4)  Poor medical f/u. Past Medical History:  Diagnosis Date  . AAA (abdominal aortic aneurysm) ([place])   . Diverticulosis   . GERD (gastroesophageal reflux disease)   . Hiatal hernia   . Hypertension   . Hypokalemia   . Lumbar spondylolysis   . Lung cancer ([place]) dx'd 2019  . Lung tumor   . On home O2     2L N/C   4) Protein calorie malnutrition -nutritional consultation  5) Heavy smoker 75-100 pk-yrs -Pt reported on 08/12/17 that she has completely quit smoking and plans to never smoke again.  -Pt has continued smoking cessation as of 03/08/18 and was encouraged to keep up her good work.  PLAN: -Discussed pt labwork today, 06/03/20; WBC are holding, other blood counts look good, liver enzymes are okay, TSH is WNL, T4 free is in progress.  -The pt has no prohibitive toxicities from continuing Carboplatin/Taxol/Pembrolizumab at this time. -Advised pt that we plan to complete 4-6 cycles with chemotherapy, then continue immunotherapy until progression or intolerance. -Advised pt that we plan to repeat scans after 4 cycles. -Will give pt the second COVID19 vaccine in clinic today.  -Advised pt to take Tramadol prn for bone pain. -Rx Tramadol -Will see back with C2D1   FOLLOW UP: Please schedule cycle 2 and cycle 3 of treatment as per chemotherapy orders. Labs and MD visit with cycle 2-day 1 and cycle 3-day 1.   The total time spent in the appt was 20 minutes and more than 50% was on counseling and direct patient cares.  All of the patient's questions were answered with apparent satisfaction. The patient knows to call the clinic with any problems, questions or concerns.    Sullivan Lone MD [place] AAHIVMS [facility] Hematology/Oncology Physician [facility]  (Office):       [phone] (Work cell):  [phone] (Fax):           [phone]  I, Yevette Edwards, am acting as a scribe for Dr. Sullivan Lone.   .I have reviewed the above documentation for accuracy and completeness, and I agree with the above. [name] MD

## 2020-06-03 NOTE — Progress Notes (Signed)
   Covid-19 Vaccination Clinic  Name:  Chelsea Baker    MRN: [id number] DOB: June 06, 1946  06/03/2020  [name] was observed post Covid-19 immunization for 15 minutes without incident. She was provided with Vaccine Information Sheet and instruction to access the V-Safe system.   [name] was instructed to call 911 with any severe reactions post vaccine: Marland Kitchen Difficulty breathing  . Swelling of face and throat  . A fast heartbeat  . A bad rash all over body  . Dizziness and weakness   Immunizations Administered    Name Date Dose VIS Date Route   Pfizer COVID-19 Vaccine 06/03/2020 10:56 AM 0.3 mL 04/17/2020 Intramuscular   Manufacturer: [place]   Lot: [id number]   [place]: [id number]

## 2020-06-04 LAB — T4: T4, Total: 9.4 ug/dL (ref 4.5–12.0)

## 2020-06-05 ENCOUNTER — Telehealth: Payer: Self-pay | Admitting: *Deleted

## 2020-06-05 NOTE — Telephone Encounter (Signed)
Patient received 2nd Covid vaccine on 06/03/20. Called 06/05/20 to report that on 06/04/20 she felt nauseous and feverish - symptoms went away after rest, fluids, tylenol and nausea medications. No further symptoms noted. She said she called because the instructions stated she should report any symptoms post vaccine.

## 2020-06-07 ENCOUNTER — Telehealth: Payer: Self-pay | Admitting: Hematology

## 2020-06-07 ENCOUNTER — Ambulatory Visit: Payer: PPO | Admitting: Hematology

## 2020-06-07 ENCOUNTER — Other Ambulatory Visit: Payer: PPO

## 2020-06-07 NOTE — Telephone Encounter (Signed)
Scheduled per 12/06 los, patient has been called and notified.

## 2020-06-14 ENCOUNTER — Other Ambulatory Visit: Payer: Self-pay | Admitting: Hematology

## 2020-06-14 ENCOUNTER — Inpatient Hospital Stay: Payer: PPO

## 2020-06-14 ENCOUNTER — Ambulatory Visit: Payer: PPO | Admitting: Hematology

## 2020-06-14 ENCOUNTER — Other Ambulatory Visit: Payer: Self-pay

## 2020-06-14 ENCOUNTER — Inpatient Hospital Stay: Payer: PPO | Admitting: Hematology

## 2020-06-14 VITALS — BP 142/88 | HR 98 | Temp 98.8°F | Resp 18

## 2020-06-14 DIAGNOSIS — C3490 Malignant neoplasm of unspecified part of unspecified bronchus or lung: Secondary | ICD-10-CM | POA: Diagnosis not present

## 2020-06-14 DIAGNOSIS — C787 Secondary malignant neoplasm of liver and intrahepatic bile duct: Secondary | ICD-10-CM

## 2020-06-14 DIAGNOSIS — Z7189 Other specified counseling: Secondary | ICD-10-CM

## 2020-06-14 DIAGNOSIS — Z23 Encounter for immunization: Secondary | ICD-10-CM | POA: Diagnosis not present

## 2020-06-14 DIAGNOSIS — Z5112 Encounter for antineoplastic immunotherapy: Secondary | ICD-10-CM | POA: Diagnosis not present

## 2020-06-14 LAB — CMP (CANCER CENTER ONLY)
ALT: 29 U/L (ref 0–44)
AST: 41 U/L (ref 15–41)
Albumin: 3.4 g/dL — ABNORMAL LOW (ref 3.5–5.0)
Alkaline Phosphatase: 369 U/L — ABNORMAL HIGH (ref 38–126)
Anion gap: 11 (ref 5–15)
BUN: 18 mg/dL (ref 8–23)
CO2: 24 mmol/L (ref 22–32)
Calcium: 9.8 mg/dL (ref 8.9–10.3)
Chloride: 100 mmol/L (ref 98–111)
Creatinine: 1.34 mg/dL — ABNORMAL HIGH (ref 0.44–1.00)
GFR, Estimated: 42 mL/min — ABNORMAL LOW (ref >60)
Glucose, Bld: 95 mg/dL (ref 70–99)
Potassium: 4.5 mmol/L (ref 3.5–5.1)
Sodium: 135 mmol/L (ref 135–145)
Total Bilirubin: 0.3 mg/dL (ref 0.3–1.2)
Total Protein: 7.4 g/dL (ref 6.5–8.1)

## 2020-06-14 LAB — CBC WITH DIFFERENTIAL (CANCER CENTER ONLY)
Abs Immature Granulocytes: 0.04 10*3/uL (ref 0.00–0.07)
Basophils Absolute: 0.1 10*3/uL (ref 0.0–0.1)
Basophils Relative: 1 %
Eosinophils Absolute: 0.1 10*3/uL (ref 0.0–0.5)
Eosinophils Relative: 1 %
HCT: 36.2 % (ref 36.0–46.0)
Hemoglobin: 11.1 g/dL — ABNORMAL LOW (ref 12.0–15.0)
Immature Granulocytes: 0 %
Lymphocytes Relative: 9 %
Lymphs Abs: 0.8 10*3/uL (ref 0.7–4.0)
MCH: 26.7 pg (ref 26.0–34.0)
MCHC: 30.7 g/dL (ref 30.0–36.0)
MCV: 87.2 fL (ref 80.0–100.0)
Monocytes Absolute: 0.9 10*3/uL (ref 0.1–1.0)
Monocytes Relative: 10 %
Neutro Abs: 7 10*3/uL (ref 1.7–7.7)
Neutrophils Relative %: 79 %
Platelet Count: 424 10*3/uL — ABNORMAL HIGH (ref 150–400)
RBC: 4.15 MIL/uL (ref 3.87–5.11)
RDW: 15.6 % — ABNORMAL HIGH (ref 11.5–15.5)
WBC Count: 8.9 10*3/uL (ref 4.0–10.5)
nRBC: 0 % (ref 0.0–0.2)

## 2020-06-14 LAB — MAGNESIUM: Magnesium: 1.9 mg/dL (ref 1.7–2.4)

## 2020-06-14 LAB — TSH: TSH: 1.257 u[IU]/mL (ref 0.308–3.960)

## 2020-06-14 MED ORDER — SODIUM CHLORIDE 0.9 % IV SOLN
10.0000 mg | Freq: Once | INTRAVENOUS | Status: AC
  Administered 2020-06-14: 11:00:00 10 mg via INTRAVENOUS
  Filled 2020-06-14: qty 10

## 2020-06-14 MED ORDER — FAMOTIDINE IN NACL 20-0.9 MG/50ML-% IV SOLN
INTRAVENOUS | Status: AC
  Filled 2020-06-14: qty 50

## 2020-06-14 MED ORDER — DIPHENHYDRAMINE HCL 50 MG/ML IJ SOLN
50.0000 mg | Freq: Once | INTRAMUSCULAR | Status: AC
  Administered 2020-06-14: 12:00:00 50 mg via INTRAVENOUS

## 2020-06-14 MED ORDER — SODIUM CHLORIDE 0.9 % IV SOLN
150.0000 mg | Freq: Once | INTRAVENOUS | Status: AC
  Administered 2020-06-14: 12:00:00 150 mg via INTRAVENOUS
  Filled 2020-06-14: qty 150

## 2020-06-14 MED ORDER — SODIUM CHLORIDE 0.9 % IV SOLN
Freq: Once | INTRAVENOUS | Status: AC
Start: 2020-06-14 — End: 2020-06-14
  Filled 2020-06-14: qty 250

## 2020-06-14 MED ORDER — SODIUM CHLORIDE 0.9 % IV SOLN
200.0000 mg | Freq: Once | INTRAVENOUS | Status: AC
  Administered 2020-06-14: 13:00:00 200 mg via INTRAVENOUS
  Filled 2020-06-14: qty 8

## 2020-06-14 MED ORDER — DIPHENHYDRAMINE HCL 50 MG/ML IJ SOLN
INTRAMUSCULAR | Status: AC
  Filled 2020-06-14: qty 1

## 2020-06-14 MED ORDER — PALONOSETRON HCL INJECTION 0.25 MG/5ML
INTRAVENOUS | Status: AC
  Filled 2020-06-14: qty 5

## 2020-06-14 MED ORDER — SODIUM CHLORIDE 0.9 % IV SOLN
240.0000 mg | Freq: Once | INTRAVENOUS | Status: AC
  Administered 2020-06-14: 17:00:00 240 mg via INTRAVENOUS
  Filled 2020-06-14: qty 24

## 2020-06-14 MED ORDER — FAMOTIDINE IN NACL 20-0.9 MG/50ML-% IV SOLN
20.0000 mg | Freq: Once | INTRAVENOUS | Status: AC
  Administered 2020-06-14: 12:00:00 20 mg via INTRAVENOUS

## 2020-06-14 MED ORDER — PALONOSETRON HCL INJECTION 0.25 MG/5ML
0.2500 mg | Freq: Once | INTRAVENOUS | Status: AC
  Administered 2020-06-14: 12:00:00 0.25 mg via INTRAVENOUS

## 2020-06-14 MED ORDER — SODIUM CHLORIDE 0.9 % IV SOLN
135.0000 mg/m2 | Freq: Once | INTRAVENOUS | Status: AC
  Administered 2020-06-14: 14:00:00 222 mg via INTRAVENOUS
  Filled 2020-06-14: qty 37

## 2020-06-14 NOTE — Progress Notes (Signed)
HEMATOLOGY/ONCOLOGY CLINIC NOTE  Date of Service: 06/14/2020  Patient Care Team: Lorrene Reid, PA-C as PCP - General (Physician Assistant)  CHIEF COMPLAINTS/PURPOSE OF CONSULTATION:   F/u for recurent squamous cell lung cancer   HISTORY OF PRESENTING ILLNESS:    Chelsea Baker is a wonderful 74 y.o. female who has been referred to [place] by Dr .Sloan Leiter, Henreitta Leber, MD  for evaluation and management of newly diagnosed Squmous cell carcinoma of the RUL.  Patient has a history of hypertension, nicotine dependence, GERD, hiatal hernia, AAA and presented with a 2-week history of cough, weight loss about 20 pounds in about 6 months and increasing shortness of breath over the last several days prior to admission.  In the ED the patient had a chest x-ray which showed a 3.4 cm Which showed a 3.4 cm right upper lobe mass which appeared cavitary.  Subsequent CT of the chest showed a 3.5 cm cavitary mass involving the right upper lobe concerning for a primary bronchogenic carcinoma.  She was also noted to have a 4 mm nodule which is indeterminate in the right lower lobe.  No evidence of metastatic disease noted in the chest or upper abdomen. No evidence of pulmonary embolism.  Patient subsequently had a CT-guided biopsy of the right upper lobe lung lesion on 07/15/2017.  Pathology revealed squamous cell carcinoma of the lung. She had other workup including an AFB smear which was negative.  Patient notes that she does not really have a primary care physician and has not had good medical follow-up and does not really see a doctor regularly. She has had a history of smoking 1/2-2 packs/day for 50 years.  No diagnosed COPD though likely has significant decline in lung function at baseline. Currently was short of breath despite lung cancer not involving a large part of the lung at this time and no evidence of PE.  Still needing 2-3 L of oxygen by nasal cannula currently and endorses shortness  of breath with minimal walking.  She notes that she lives with her son.   INTERVAL HISTORY:  Chelsea Baker is here regarding her lung cancer. She is here today for C2D1 Carboplatin/Taxol/Pembrolizumab. The patient's last visit with [place] was on 06/03/2020. The pt reports that she is doing well overall.  The pt reports that she is having some difficulty swallowing today that she attributes to dry throat. She denies any issues swallowing prior to this or worsening shortness of breath. Pt has been experiencing scalp itching and discomfort. She is also having hair loss. Pt had a small rash on her face directly after her last treatment that resolved quickly with Benadryl. She notes fever and chills for one day after her second COVID19 vaccine.   Lab results today (06/14/20) of CBC w/diff and CMP is as follows: all values are WNL except for Hgb at 11.1, RDW at 15.6, PLT at 424K, Creatinine at 1.34, Albumin at 3.4, ALP at 369, GFR Est at 42. 06/14/2020 Magnesium at 1.9 06/14/2020 TSH at 1.257 06/14/2020 T4 at 9.8  On review of systems, pt reports dry throat, itchy scalp, hair loss and denies abdominal pain, SOB, diarrhea, rash, itchy throat and any other symptoms.    MEDICAL HISTORY:  Past Medical History:  Diagnosis Date  . AAA (abdominal aortic aneurysm) ([place])   . Diverticulosis   . GERD (gastroesophageal reflux disease)   . Hiatal hernia   . Hypertension   . Hypokalemia   . Lumbar spondylolysis   .  Lung cancer ([place]) dx'd 2019  . Lung tumor   . On home O2    2L N/C    SURGICAL HISTORY: Past Surgical History:  Procedure Laterality Date  . ABDOMINAL AORTIC ANEURYSM REPAIR  10/01/10   endostent repair  . CATARACT EXTRACTION, BILATERAL    . LUNG BIOPSY    . NM MYOVIEW LTD  2012   Normal Lexiscan Myoview with no ischemia or infarction.  Normal LV function.  EF 67%.  . TONSILLECTOMY    . TRANSTHORACIC ECHOCARDIOGRAM  07/17/2017   EF 35-40%.  Septal mid and basal inferior wall  hypokinesis.  Mildly dilated LV.  Mildly reduced EF.  GR 1 DD.  Calcified mitral annulus.     SOCIAL HISTORY: Social History   Socioeconomic History  . Marital status: Single    Spouse name: Not on file  . Number of children: Not on file  . Years of education: Not on file  . Highest education level: Not on file  Occupational History  . Not on file  Tobacco Use  . Smoking status: Former Smoker    Packs/day: 1.00    Years: 50.00    Pack years: 50.00    Types: Cigarettes    Quit date: 06/2017    Years since quitting: 2.9  . Smokeless tobacco: Never Used  Vaping Use  . Vaping Use: Never used  Substance and Sexual Activity  . Alcohol use: Yes    Comment: rarely  . Drug use: No  . Sexual activity: Not Currently  Other Topics Concern  . Not on file  Social History Narrative  . Not on file   Social Determinants of Health   Financial Resource Strain: Not on file  Food Insecurity: Not on file  Transportation Needs: Not on file  Physical Activity: Not on file  Stress: Not on file  Social Connections: Not on file  Intimate Partner Violence: Not on file    FAMILY HISTORY: Family History  Problem Relation Age of Onset  . Heart failure Mother   . Heart attack Mother 57       In 87s - 1st MI  . Coronary artery disease Mother   . Coronary artery disease Father        Died at age 47  . Heart attack Father 30  . Heart disease Maternal Grandmother   . Heart disease Maternal Grandfather   . Cancer Neg Hx     ALLERGIES:  has No Known Allergies.  MEDICATIONS:  Current Outpatient Medications  Medication Sig Dispense Refill  . acetaminophen (TYLENOL) 500 MG tablet Take 500 mg by mouth every 6 (six) hours as needed.    Marland Kitchen albuterol (VENTOLIN HFA) 108 (90 Base) MCG/ACT inhaler INHALE 2 PUFFS INTO THE LUNGS EVERY 4 (FOUR) HOURS AS NEEDED FOR WHEEZING OR SHORTNESS OF BREATH. 18 g 5  . Baclofen 5 MG TABS Take 1 tablet by mouth 2 (two) times daily as needed. 60 tablet 0  .  budesonide (PULMICORT) 0.5 MG/2ML nebulizer solution Take 2 mLs (0.5 mg total) by nebulization 2 (two) times daily. 120 mL 5  . dexamethasone (DECADRON) 4 MG tablet Take 2 tablets (8 mg total) by mouth daily. Start the day after carboplatin chemotherapy for 3 days. 30 tablet 1  . fexofenadine (ALLEGRA) 180 MG tablet Take 1 tablet (180 mg total) by mouth daily. 30 tablet 1  . ipratropium-albuterol (DUONEB) 0.5-2.5 (3) MG/3ML SOLN Take 3 mLs by nebulization every 4 (four) hours as needed. 90 mL 0  .  LORazepam (ATIVAN) 0.5 MG tablet Take 1 tablet (0.5 mg total) by mouth every 6 (six) hours as needed (Nausea or vomiting). 30 tablet 0  . metoprolol succinate (TOPROL-XL) 50 MG 24 hr tablet TAKE 1 TAB DAILY. TAKE WITH OR IMMEDIATELY FOLLOWING A MEAL.**PATIENT WILL NEED TO FOLLOW UP WITH MD 60 tablet 0  . ondansetron (ZOFRAN) 8 MG tablet Take 1 tablet (8 mg total) by mouth 2 (two) times daily as needed for refractory nausea / vomiting. Start on day 3 after carboplatin chemo. 30 tablet 1  . prochlorperazine (COMPAZINE) 10 MG tablet Take 1 tablet (10 mg total) by mouth every 6 (six) hours as needed (Nausea or vomiting). 30 tablet 1  . traMADol (ULTRAM) 50 MG tablet Take 1 tablet (50 mg total) by mouth every 6 (six) hours as needed. 30 tablet 0   No current facility-administered medications for this visit.    REVIEW OF SYSTEMS:   A 10+ POINT REVIEW OF SYSTEMS WAS OBTAINED including neurology, dermatology, psychiatry, cardiac, respiratory, lymph, extremities, GI, GU, Musculoskeletal, constitutional, breasts, reproductive, HEENT.  All pertinent positives are noted in the HPI.  All others are negative.   PHYSICAL EXAMINATION: There were no vitals filed for this visit. Wt Readings from Last 3 Encounters:  06/03/20 141 lb 4.8 oz (64.1 kg)  05/13/20 141 lb 4.8 oz (64.1 kg)  04/15/20 143 lb 3.2 oz (65 kg)   There is no height or weight on file to calculate BMI.    Exam was given in a chair   GENERAL:alert,  in no acute distress and comfortable SKIN: no acute rashes, no significant lesions EYES: conjunctiva are pink and non-injected, sclera anicteric OROPHARYNX: MMM, no exudates, no oropharyngeal erythema or ulceration NECK: supple, no JVD LYMPH:  no palpable lymphadenopathy in the cervical, axillary or inguinal regions LUNGS: clear to auscultation b/l with normal respiratory effort HEART: regular rate & rhythm ABDOMEN:  normoactive bowel sounds , non tender, not distended. No palpable hepatosplenomegaly.  Extremity: no pedal edema PSYCH: alert & oriented x 3 with fluent speech NEURO: no focal motor/sensory deficits  LABORATORY DATA:  I have reviewed the data as listed  . CBC Latest Ref Rng & Units 06/03/2020 05/24/2020 05/03/2020  WBC 4.0 - 10.5 K/uL 24.5(H) 6.8 4.9  Hemoglobin 12.0 - 15.0 g/dL 11.1(L) 12.2 12.5  Hematocrit 36.0 - 46.0 % 37.1 40.7 41.3  Platelets 150 - 400 K/uL 319 305 299    . CMP Latest Ref Rng & Units 06/03/2020 05/24/2020 05/03/2020  Glucose 70 - 99 mg/dL 104(H) 111(H) 102(H)  BUN 8 - 23 mg/dL 17 18 19   Creatinine 0.44 - 1.00 mg/dL 1.26(H) 1.45(H) 1.38(H)  Sodium 135 - 145 mmol/L 137 134(L) 137  Potassium 3.5 - 5.1 mmol/L 4.4 4.6 4.4  Chloride 98 - 111 mmol/L 102 99 100  CO2 22 - 32 mmol/L 25 23 25   Calcium 8.9 - 10.3 mg/dL 9.5 9.7 9.5  Total Protein 6.5 - 8.1 g/dL 7.3 7.3 7.9  Total Bilirubin 0.3 - 1.2 mg/dL 0.2(L) 0.6 0.6  Alkaline Phos 38 - 126 U/L 336(H) 320(H) 133(H)  AST 15 - 41 U/L 46(H) 91(H) 46(H)  ALT 0 - 44 U/L 39 68(H) 37   05/03/2020 Surgical Pathology Report ([id number]):    PATHOLOGY      RADIOGRAPHIC STUDIES: I have personally reviewed the radiological images as listed and agreed with the findings in the report. No results found.  ASSESSMENT & PLAN:   Chelsea Baker is a 74 y.o. female  with   1) Metastatic lung squamous cell carcinoma Previous h/o right upper lobe squamous cell carcinoma of the lung. Stage I Presenting  with a cavitary RUL mass.  Biopsy proven to be squamous cell carcinoma. No overt mediastinal or hilar lymphadenopathy noted. PET/CT showed no overt metastatic disease CT head neg for mets. Patient declined MRI S/p SBRT 60 Gy in 5 fractions 09/01/17 to 09/10/17  CT Chest 10/22/17 shows improvement in the right upper lobe mass after [facility]  03/01/18 CT Chest revealed Continued reduction in spiculated posterior right upper lobe pulmonary nodule. 2. Left upper lobe 1.6 cm spiculated pulmonary nodule is increased in size and density, compatible with malignancy, either a second primary bronchogenic carcinoma or contralateral metastasis. 3. No thoracic adenopathy.  03/18/18 PET/CT revealed Intense metabolic activity associated with enlarging LEFT upper lobe pulmonary nodule is most suggestive primary bronchogenic Carcinoma. 2. Mild to moderate activity associated treated pulmonary nodule in the RIGHT upper lobe with associated moderate metabolic activity in adjacent chest wall favored post radiation change inflammation. 3. Stable additional RIGHT lower lobe pulmonary nodule. 4. No new pulmonary nodules evident. 5. No evidence of metastatic mediastinal adenopathy or distant disease.  07/12/18 CT Chest which revealed 2.2 x 1.2 cm irregular/spiculated posterior right upper lobe nodule, grossly unchanged. 5 x 10 mm left upper lobe nodule, decreased. Aortic Atherosclerosis and Emphysema.  05/16/2019 CT Chest Wo Contrast (Accession [id number]) which revealed "Radiation changes in the posterior right upper lobe and left upper lobe/lingula.10 mm lingular nodule, progressive, suspicious for recurrence."  09/07/2019 CT Chest ([id number]) revealed "1. Recently treated lingular pulmonary nodule is mildly decreased in size. 2. Masslike fibrosis in the posterior right upper lobe is mildly increased, equivocal for continued evolution of postradiation change versus early recurrence. 3. Otherwise no new or progressive metastatic  disease in the chest."  04/10/2020 PET/CT ([id number]) revealed "1. Local hypermetabolic nodal metastatic recurrence in the prevascular nodal station and LEFT hilum. 2. Bands of fibrotic change and consolidation in LEFT upper lobe and RIGHT upper lobe with low metabolic activity are favored post therapy benign findings. 3. Unfortunately, distant metastatic disease with new multifocal hypermetabolic HEPATIC METASTASIS."   2) Now with Left upper lobe 1.6 cm spiculated pulmonary nodule- increasing in size. Seen on the 03/18/18 PET/CT as noted above. Newly identified Left enlarging nodule is suspected to be a new primary, and would be considered a new Stage I tumor S/p 54 Gy in 3 fractions 04/06/18 to 04/13/18  3) Dyspnea and rest and on minimal exertion. - much improved CT chest with no PE  significant COPD at baseline -  Needing 2-3 L of oxygen by nasal cannula in hospital. and is now off oxygen ECHO ef 35-40% -Pulmonary function testing -severe obstruction - optimize rx -noted to have systolic cardiomyopathy - mx Dr Ellyn Hack. -Continue follow up with Dr. Sherrilyn Rist in Pulmonology   4) Poor medical f/u. Past Medical History:  Diagnosis Date  . AAA (abdominal aortic aneurysm) ([place])   . Diverticulosis   . GERD (gastroesophageal reflux disease)   . Hiatal hernia   . Hypertension   . Hypokalemia   . Lumbar spondylolysis   . Lung cancer ([place]) dx'd 2019  . Lung tumor   . On home O2    2L N/C   4) Protein calorie malnutrition -nutritional consultation  5) Heavy smoker 75-100 pk-yrs -Pt reported on 08/12/17 that she has completely quit smoking and plans to never smoke again.  -Pt has continued smoking cessation as of 03/08/18 and  was encouraged to keep up her good work.  PLAN: -Discussed pt labwork today, 06/14/20; Hgb & PLT are stable, WBC are nml, chemistries are stable; TSH, T4, & Magneisum are WNL. -The pt has no prohibitive toxicities from continuing C2D1  Carboplatin/Taxol/Pembrolizumab with G-CSF support at this time. -Recommend pt use Selsun Blue shampoo for itchy scalp. -Will repeat scans after C4.  -Will see back with C3D1 -continue f/u with PCP, pulmonary to continue to optimize mx of COPD, CHF and other medical issues.   FOLLOW UP: Plz schedule C2D3 Udenyca for Monday morning 06/17/2020 Plz schedule C3D1 carbo/taxol/pelbrolizumab with labs and MD visit and C3D3 of Udenyca   The total time spent in the appt was 30 minutes and more than 50% was on counseling and direct patient cares.  All of the patient's questions were answered with apparent satisfaction. The patient knows to call the clinic with any problems, questions or concerns.    Sullivan Lone MD [place] AAHIVMS [facility] Hematology/Oncology Physician [facility]  (Office):       [phone] (Work cell):  [phone] (Fax):           [phone]  I, Yevette Edwards, am acting as a scribe for Dr. Sullivan Lone.   .I have reviewed the above documentation for accuracy and completeness, and I agree with the above. [name] MD

## 2020-06-14 NOTE — Patient Instructions (Signed)
[facility] Discharge Instructions for Patients Receiving Chemotherapy  Today you received the following chemotherapy agents:  Keytruda, Taxol, Carbo  To help prevent nausea and vomiting after your treatment, we encourage you to take your nausea medication as prescribed.   If you develop nausea and vomiting that is not controlled by your nausea medication, call the clinic.   BELOW ARE SYMPTOMS THAT SHOULD BE REPORTED IMMEDIATELY:  *FEVER GREATER THAN 100.5 F  *CHILLS WITH OR WITHOUT FEVER  NAUSEA AND VOMITING THAT IS NOT CONTROLLED WITH YOUR NAUSEA MEDICATION  *UNUSUAL SHORTNESS OF BREATH  *UNUSUAL BRUISING OR BLEEDING  TENDERNESS IN MOUTH AND THROAT WITH OR WITHOUT PRESENCE OF ULCERS  *URINARY PROBLEMS  *BOWEL PROBLEMS  UNUSUAL RASH Items with * indicate a potential emergency and should be followed up as soon as possible.  Feel free to call the clinic should you have any questions or concerns. The clinic phone number is [phone].  Please show the [facility] at check-in to the Emergency Department and triage nurse.

## 2020-06-15 LAB — T4: T4, Total: 9.8 ug/dL (ref 4.5–12.0)

## 2020-06-17 ENCOUNTER — Other Ambulatory Visit: Payer: Self-pay

## 2020-06-17 ENCOUNTER — Other Ambulatory Visit: Payer: Self-pay | Admitting: Hematology

## 2020-06-17 ENCOUNTER — Inpatient Hospital Stay: Payer: PPO

## 2020-06-17 VITALS — BP 129/88 | HR 124 | Temp 98.1°F | Resp 18

## 2020-06-17 DIAGNOSIS — I10 Essential (primary) hypertension: Secondary | ICD-10-CM

## 2020-06-17 DIAGNOSIS — C3490 Malignant neoplasm of unspecified part of unspecified bronchus or lung: Secondary | ICD-10-CM

## 2020-06-17 DIAGNOSIS — Z7189 Other specified counseling: Secondary | ICD-10-CM

## 2020-06-17 DIAGNOSIS — Z5112 Encounter for antineoplastic immunotherapy: Secondary | ICD-10-CM | POA: Diagnosis not present

## 2020-06-17 MED ORDER — PEGFILGRASTIM-JMDB 6 MG/0.6ML ~~LOC~~ SOSY
PREFILLED_SYRINGE | SUBCUTANEOUS | Status: AC
  Filled 2020-06-17: qty 0.6

## 2020-06-17 MED ORDER — PEGFILGRASTIM-JMDB 6 MG/0.6ML ~~LOC~~ SOSY
6.0000 mg | PREFILLED_SYRINGE | Freq: Once | SUBCUTANEOUS | Status: AC
  Administered 2020-06-17: 13:00:00 6 mg via SUBCUTANEOUS

## 2020-06-17 MED ORDER — METOPROLOL TARTRATE 25 MG PO TABS
25.0000 mg | ORAL_TABLET | Freq: Once | ORAL | Status: AC
  Administered 2020-06-17: 13:00:00 25 mg via ORAL
  Filled 2020-06-17: qty 1

## 2020-06-17 NOTE — Progress Notes (Signed)
Patient here for Fulphia injection. HR noted to be high and patient c/o fatigue.Reported to Dr. [name]. Patient moved to [facility] room 5.  EKG ordered by MD and done. MD ordered metoprolol 25 mg po - one dose. [facility] to order from [place].

## 2020-06-17 NOTE — Progress Notes (Signed)
Patient with sinus tachycardia, PVC's. HR 130/min. NO overt Chest pain or increased SOB. Patient notes she missed her Toprol XL last night. Given metoprolol 25mg  x 1 and received her udenyca as planned. Recommended to maintain compliance with her medications including Toprol XL and f/u with PCP in 1-2 weeks.  Chelsea Baker

## 2020-06-17 NOTE — Patient Instructions (Signed)

## 2020-06-17 NOTE — Progress Notes (Signed)
Patient had a HR of 125-130 resting made MD aware and moved patient to the MD side for EKG.

## 2020-07-05 ENCOUNTER — Other Ambulatory Visit: Payer: Self-pay

## 2020-07-05 ENCOUNTER — Inpatient Hospital Stay: Payer: PPO

## 2020-07-05 ENCOUNTER — Inpatient Hospital Stay: Payer: PPO | Attending: Hematology

## 2020-07-05 ENCOUNTER — Inpatient Hospital Stay (HOSPITAL_BASED_OUTPATIENT_CLINIC_OR_DEPARTMENT_OTHER): Payer: PPO | Admitting: Hematology

## 2020-07-05 VITALS — BP 157/92 | HR 94 | Temp 98.1°F | Resp 19 | Ht 59.0 in | Wt 135.4 lb

## 2020-07-05 DIAGNOSIS — E46 Unspecified protein-calorie malnutrition: Secondary | ICD-10-CM | POA: Insufficient documentation

## 2020-07-05 DIAGNOSIS — Z5111 Encounter for antineoplastic chemotherapy: Secondary | ICD-10-CM | POA: Diagnosis not present

## 2020-07-05 DIAGNOSIS — R0782 Intercostal pain: Secondary | ICD-10-CM

## 2020-07-05 DIAGNOSIS — C3411 Malignant neoplasm of upper lobe, right bronchus or lung: Secondary | ICD-10-CM | POA: Diagnosis not present

## 2020-07-05 DIAGNOSIS — Z79899 Other long term (current) drug therapy: Secondary | ICD-10-CM | POA: Insufficient documentation

## 2020-07-05 DIAGNOSIS — C3490 Malignant neoplasm of unspecified part of unspecified bronchus or lung: Secondary | ICD-10-CM | POA: Diagnosis not present

## 2020-07-05 DIAGNOSIS — Z5112 Encounter for antineoplastic immunotherapy: Secondary | ICD-10-CM | POA: Diagnosis not present

## 2020-07-05 DIAGNOSIS — C787 Secondary malignant neoplasm of liver and intrahepatic bile duct: Secondary | ICD-10-CM

## 2020-07-05 DIAGNOSIS — J449 Chronic obstructive pulmonary disease, unspecified: Secondary | ICD-10-CM | POA: Insufficient documentation

## 2020-07-05 DIAGNOSIS — Z5189 Encounter for other specified aftercare: Secondary | ICD-10-CM | POA: Diagnosis not present

## 2020-07-05 DIAGNOSIS — Z7189 Other specified counseling: Secondary | ICD-10-CM

## 2020-07-05 LAB — CMP (CANCER CENTER ONLY)
ALT: 37 U/L (ref 0–44)
AST: 59 U/L — ABNORMAL HIGH (ref 15–41)
Albumin: 3.4 g/dL — ABNORMAL LOW (ref 3.5–5.0)
Alkaline Phosphatase: 510 U/L — ABNORMAL HIGH (ref 38–126)
Anion gap: 13 (ref 5–15)
BUN: 26 mg/dL — ABNORMAL HIGH (ref 8–23)
CO2: 23 mmol/L (ref 22–32)
Calcium: 9.6 mg/dL (ref 8.9–10.3)
Chloride: 99 mmol/L (ref 98–111)
Creatinine: 1.38 mg/dL — ABNORMAL HIGH (ref 0.44–1.00)
GFR, Estimated: 40 mL/min — ABNORMAL LOW (ref >60)
Glucose, Bld: 92 mg/dL (ref 70–99)
Potassium: 4.5 mmol/L (ref 3.5–5.1)
Sodium: 135 mmol/L (ref 135–145)
Total Bilirubin: 0.4 mg/dL (ref 0.3–1.2)
Total Protein: 7.7 g/dL (ref 6.5–8.1)

## 2020-07-05 LAB — CBC WITH DIFFERENTIAL/PLATELET
Abs Immature Granulocytes: 0.05 10*3/uL (ref 0.00–0.07)
Basophils Absolute: 0.1 10*3/uL (ref 0.0–0.1)
Basophils Relative: 1 %
Eosinophils Absolute: 0 10*3/uL (ref 0.0–0.5)
Eosinophils Relative: 0 %
HCT: 36.7 % (ref 36.0–46.0)
Hemoglobin: 11.2 g/dL — ABNORMAL LOW (ref 12.0–15.0)
Immature Granulocytes: 1 %
Lymphocytes Relative: 10 %
Lymphs Abs: 1 10*3/uL (ref 0.7–4.0)
MCH: 26.7 pg (ref 26.0–34.0)
MCHC: 30.5 g/dL (ref 30.0–36.0)
MCV: 87.6 fL (ref 80.0–100.0)
Monocytes Absolute: 0.8 10*3/uL (ref 0.1–1.0)
Monocytes Relative: 8 %
Neutro Abs: 8.3 10*3/uL — ABNORMAL HIGH (ref 1.7–7.7)
Neutrophils Relative %: 80 %
Platelets: 366 10*3/uL (ref 150–400)
RBC: 4.19 MIL/uL (ref 3.87–5.11)
RDW: 16.1 % — ABNORMAL HIGH (ref 11.5–15.5)
WBC: 10.2 10*3/uL (ref 4.0–10.5)
nRBC: 0 % (ref 0.0–0.2)

## 2020-07-05 LAB — TSH: TSH: 2.242 u[IU]/mL (ref 0.308–3.960)

## 2020-07-05 LAB — MAGNESIUM: Magnesium: 2.2 mg/dL (ref 1.7–2.4)

## 2020-07-05 MED ORDER — SODIUM CHLORIDE 0.9 % IV SOLN
244.8000 mg | Freq: Once | INTRAVENOUS | Status: AC
  Administered 2020-07-05: 240 mg via INTRAVENOUS
  Filled 2020-07-05: qty 24

## 2020-07-05 MED ORDER — SODIUM CHLORIDE 0.9 % IV SOLN
10.0000 mg | Freq: Once | INTRAVENOUS | Status: AC
  Administered 2020-07-05: 10 mg via INTRAVENOUS
  Filled 2020-07-05: qty 10

## 2020-07-05 MED ORDER — ACETAMINOPHEN 325 MG PO TABS
ORAL_TABLET | ORAL | Status: AC
  Filled 2020-07-05: qty 2

## 2020-07-05 MED ORDER — TRAMADOL HCL 50 MG PO TABS: 50.0000 mg | ORAL_TABLET | Freq: Four times a day (QID) | ORAL | 0 refills | Status: DC | PRN

## 2020-07-05 MED ORDER — PALONOSETRON HCL INJECTION 0.25 MG/5ML
INTRAVENOUS | Status: AC
  Filled 2020-07-05: qty 5

## 2020-07-05 MED ORDER — ACETAMINOPHEN 325 MG PO TABS
650.0000 mg | ORAL_TABLET | Freq: Once | ORAL | Status: AC
  Administered 2020-07-05: 650 mg via ORAL

## 2020-07-05 MED ORDER — FAMOTIDINE IN NACL 20-0.9 MG/50ML-% IV SOLN
INTRAVENOUS | Status: AC
  Filled 2020-07-05: qty 50

## 2020-07-05 MED ORDER — SODIUM CHLORIDE 0.9 % IV SOLN
150.0000 mg | Freq: Once | INTRAVENOUS | Status: AC
  Administered 2020-07-05: 150 mg via INTRAVENOUS
  Filled 2020-07-05: qty 150

## 2020-07-05 MED ORDER — DIPHENHYDRAMINE HCL 50 MG/ML IJ SOLN
INTRAMUSCULAR | Status: AC
  Filled 2020-07-05: qty 1

## 2020-07-05 MED ORDER — PALONOSETRON HCL INJECTION 0.25 MG/5ML
0.2500 mg | Freq: Once | INTRAVENOUS | Status: AC
  Administered 2020-07-05: 0.25 mg via INTRAVENOUS

## 2020-07-05 MED ORDER — FAMOTIDINE IN NACL 20-0.9 MG/50ML-% IV SOLN
20.0000 mg | Freq: Once | INTRAVENOUS | Status: AC
  Administered 2020-07-05: 20 mg via INTRAVENOUS

## 2020-07-05 MED ORDER — ACYCLOVIR 400 MG PO TABS: 400.0000 mg | ORAL_TABLET | Freq: Two times a day (BID) | ORAL | 2 refills | Status: DC

## 2020-07-05 MED ORDER — SODIUM CHLORIDE 0.9 % IV SOLN
135.0000 mg/m2 | Freq: Once | INTRAVENOUS | Status: AC
  Administered 2020-07-05: 222 mg via INTRAVENOUS
  Filled 2020-07-05: qty 37

## 2020-07-05 MED ORDER — DIPHENHYDRAMINE HCL 50 MG/ML IJ SOLN
50.0000 mg | Freq: Once | INTRAMUSCULAR | Status: AC
  Administered 2020-07-05: 50 mg via INTRAVENOUS

## 2020-07-05 MED ORDER — SODIUM CHLORIDE 0.9 % IV SOLN
Freq: Once | INTRAVENOUS | Status: AC
  Filled 2020-07-05: qty 250

## 2020-07-05 MED ORDER — SODIUM CHLORIDE 0.9 % IV SOLN
200.0000 mg | Freq: Once | INTRAVENOUS | Status: AC
  Administered 2020-07-05: 200 mg via INTRAVENOUS
  Filled 2020-07-05: qty 8

## 2020-07-05 NOTE — Patient Instructions (Signed)
[facility] Discharge Instructions for Patients Receiving Chemotherapy  Today you received the following chemotherapy agents:  Keytruda, Taxol, Carbo  To help prevent nausea and vomiting after your treatment, we encourage you to take your nausea medication as prescribed.   If you develop nausea and vomiting that is not controlled by your nausea medication, call the clinic.   BELOW ARE SYMPTOMS THAT SHOULD BE REPORTED IMMEDIATELY:  *FEVER GREATER THAN 100.5 F  *CHILLS WITH OR WITHOUT FEVER  NAUSEA AND VOMITING THAT IS NOT CONTROLLED WITH YOUR NAUSEA MEDICATION  *UNUSUAL SHORTNESS OF BREATH  *UNUSUAL BRUISING OR BLEEDING  TENDERNESS IN MOUTH AND THROAT WITH OR WITHOUT PRESENCE OF ULCERS  *URINARY PROBLEMS  *BOWEL PROBLEMS  UNUSUAL RASH Items with * indicate a potential emergency and should be followed up as soon as possible.  Feel free to call the clinic should you have any questions or concerns. The clinic phone number is [phone].  Please show the [facility] at check-in to the Emergency Department and triage nurse.

## 2020-07-05 NOTE — Progress Notes (Signed)
HEMATOLOGY/ONCOLOGY CLINIC NOTE  Date of Service: 07/05/2020  Patient Care Team: Lorrene Reid, PA-C as PCP - General (Physician Assistant)  CHIEF COMPLAINTS/PURPOSE OF CONSULTATION:   F/u for recurent squamous cell lung cancer  HISTORY OF PRESENTING ILLNESS:    Chelsea Baker is a wonderful 75 y.o. female who has been referred to [place] by Dr .Sloan Leiter, Henreitta Leber, MD  for evaluation and management of newly diagnosed Squmous cell carcinoma of the RUL.  Patient has a history of hypertension, nicotine dependence, GERD, hiatal hernia, AAA and presented with a 2-week history of cough, weight loss about 20 pounds in about 6 months and increasing shortness of breath over the last several days prior to admission.  In the ED the patient had a chest x-ray which showed a 3.4 cm Which showed a 3.4 cm right upper lobe mass which appeared cavitary.  Subsequent CT of the chest showed a 3.5 cm cavitary mass involving the right upper lobe concerning for a primary bronchogenic carcinoma.  She was also noted to have a 4 mm nodule which is indeterminate in the right lower lobe.  No evidence of metastatic disease noted in the chest or upper abdomen. No evidence of pulmonary embolism.  Patient subsequently had a CT-guided biopsy of the right upper lobe lung lesion on 07/15/2017.  Pathology revealed squamous cell carcinoma of the lung. She had other workup including an AFB smear which was negative.  Patient notes that she does not really have a primary care physician and has not had good medical follow-up and does not really see a doctor regularly. She has had a history of smoking 1/2-2 packs/day for 50 years.  No diagnosed COPD though likely has significant decline in lung function at baseline. Currently was short of breath despite lung cancer not involving a large part of the lung at this time and no evidence of PE.  Still needing 2-3 L of oxygen by nasal cannula currently and endorses shortness of  breath with minimal walking.  She notes that she lives with her son.   INTERVAL HISTORY: Chelsea Baker is here regarding her lung cancer. She is here today for C3D1 Carboplatin/Taxol/Pembrolizumab. The patient's last visit with [place] was on 06/14/2020. The pt reports that she is doing well overall.  The pt reports that she has been doing well and has since stopped working. She notes that she received her second dose of the COVID vaccine, becoming very nauseated with chills the day after.  The pt notes a sharp pain in her right flank that started over a week ago, feeling like a stab in her side. This pain is alleviated with application of heat and an Ace bandage.She claims that ice did not alleviate any pain and deep breaths or coughing can be painful. The pt claims that her dogs often times jump on her and try to play with her, with one dog being over 120 pounds. The pt's friend claims there is a bruise in that area. She thought that it was something she ate, but claims it got worse a few days ago. She denies any rashes but admits she has had Shingles before. This Shingles was not in that area.   Lab results today (07/05/20) of CBC w/diff and CMP is as follows: all values are WNL except for Hgb at 11.2, RDW at 16.1, Neuro Abs at 8.3K, BUN at 26, Creatinine at 1.38, Albumin at 3.4, AST at 59, ALP at 510, GFR Est at 40. 07/05/2020 TSH are  in progress 07/05/2020 T4 are in progress 07/05/2020 Magnesium at 2.2  On review of systems, pt reports fatigue, loss of appetite, slight nausea, abdominal pain and denies fevers, chills, SOB, vomiting, night sweats, uncontrolled weight loss, back pain, changes in bowel habits and any other symptoms.   MEDICAL HISTORY:  Past Medical History:  Diagnosis Date  . AAA (abdominal aortic aneurysm) ([place])   . Diverticulosis   . GERD (gastroesophageal reflux disease)   . Hiatal hernia   . Hypertension   . Hypokalemia   . Lumbar spondylolysis   . Lung cancer  ([place]) dx'd 2019  . Lung tumor   . On home O2    2L N/C    SURGICAL HISTORY: Past Surgical History:  Procedure Laterality Date  . ABDOMINAL AORTIC ANEURYSM REPAIR  10/01/10   endostent repair  . CATARACT EXTRACTION, BILATERAL    . LUNG BIOPSY    . NM MYOVIEW LTD  2012   Normal Lexiscan Myoview with no ischemia or infarction.  Normal LV function.  EF 67%.  . TONSILLECTOMY    . TRANSTHORACIC ECHOCARDIOGRAM  07/17/2017   EF 35-40%.  Septal mid and basal inferior wall hypokinesis.  Mildly dilated LV.  Mildly reduced EF.  GR 1 DD.  Calcified mitral annulus.     SOCIAL HISTORY: Social History   Socioeconomic History  . Marital status: Single    Spouse name: Not on file  . Number of children: Not on file  . Years of education: Not on file  . Highest education level: Not on file  Occupational History  . Not on file  Tobacco Use  . Smoking status: Former Smoker    Packs/day: 1.00    Years: 50.00    Pack years: 50.00    Types: Cigarettes    Quit date: 06/2017    Years since quitting: 3.0  . Smokeless tobacco: Never Used  Vaping Use  . Vaping Use: Never used  Substance and Sexual Activity  . Alcohol use: Yes    Comment: rarely  . Drug use: No  . Sexual activity: Not Currently  Other Topics Concern  . Not on file  Social History Narrative  . Not on file   Social Determinants of Health   Financial Resource Strain: Not on file  Food Insecurity: Not on file  Transportation Needs: Not on file  Physical Activity: Not on file  Stress: Not on file  Social Connections: Not on file  Intimate Partner Violence: Not on file    FAMILY HISTORY: Family History  Problem Relation Age of Onset  . Heart failure Mother   . Heart attack Mother 70       In 60s - 1st MI  . Coronary artery disease Mother   . Coronary artery disease Father        Died at age 29  . Heart attack Father 53  . Heart disease Maternal Grandmother   . Heart disease Maternal Grandfather   . Cancer Neg Hx      ALLERGIES:  has No Known Allergies.  MEDICATIONS:  Current Outpatient Medications  Medication Sig Dispense Refill  . acyclovir (ZOVIRAX) 400 MG tablet Take 1 tablet (400 mg total) by mouth 2 (two) times daily. 60 tablet 2  . acetaminophen (TYLENOL) 500 MG tablet Take 500 mg by mouth every 6 (six) hours as needed.    Marland Kitchen albuterol (VENTOLIN HFA) 108 (90 Base) MCG/ACT inhaler INHALE 2 PUFFS INTO THE LUNGS EVERY 4 (FOUR) HOURS AS NEEDED FOR WHEEZING  OR SHORTNESS OF BREATH. 18 g 5  . Baclofen 5 MG TABS Take 1 tablet by mouth 2 (two) times daily as needed. 60 tablet 0  . budesonide (PULMICORT) 0.5 MG/2ML nebulizer solution Take 2 mLs (0.5 mg total) by nebulization 2 (two) times daily. 120 mL 5  . dexamethasone (DECADRON) 4 MG tablet Take 2 tablets (8 mg total) by mouth daily. Start the day after carboplatin chemotherapy for 3 days. 30 tablet 1  . fexofenadine (ALLEGRA) 180 MG tablet Take 1 tablet (180 mg total) by mouth daily. 30 tablet 1  . ipratropium-albuterol (DUONEB) 0.5-2.5 (3) MG/3ML SOLN Take 3 mLs by nebulization every 4 (four) hours as needed. 90 mL 0  . LORazepam (ATIVAN) 0.5 MG tablet Take 1 tablet (0.5 mg total) by mouth every 6 (six) hours as needed (Nausea or vomiting). 30 tablet 0  . metoprolol succinate (TOPROL-XL) 50 MG 24 hr tablet TAKE 1 TAB DAILY. TAKE WITH OR IMMEDIATELY FOLLOWING A MEAL.**PATIENT WILL NEED TO FOLLOW UP WITH MD 60 tablet 0  . ondansetron (ZOFRAN) 8 MG tablet Take 1 tablet (8 mg total) by mouth 2 (two) times daily as needed for refractory nausea / vomiting. Start on day 3 after carboplatin chemo. 30 tablet 1  . prochlorperazine (COMPAZINE) 10 MG tablet Take 1 tablet (10 mg total) by mouth every 6 (six) hours as needed (Nausea or vomiting). 30 tablet 1  . traMADol (ULTRAM) 50 MG tablet Take 1 tablet (50 mg total) by mouth every 6 (six) hours as needed. 30 tablet 0   No current facility-administered medications for this visit.    REVIEW OF SYSTEMS:   A 10+  POINT REVIEW OF SYSTEMS WAS OBTAINED including neurology, dermatology, psychiatry, cardiac, respiratory, lymph, extremities, GI, GU, Musculoskeletal, constitutional, breasts, reproductive, HEENT.  All pertinent positives are noted in the HPI.  All others are negative.   PHYSICAL EXAMINATION: Vitals:   07/05/20 0941  BP: (!) 157/92  Pulse: 94  Resp: 19  Temp: 98.1 F (36.7 C)  SpO2: 94%   Wt Readings from Last 3 Encounters:  07/05/20 135 lb 6.4 oz (61.4 kg)  06/03/20 141 lb 4.8 oz (64.1 kg)  05/13/20 141 lb 4.8 oz (64.1 kg)   Body mass index is 27.35 kg/m.    GENERAL:alert, in no acute distress and comfortable, tenderness to palpation along the right posterior lower chest wall with pain radiating along that segment of the chest. SKIN: no acute rashes, no significant lesions EYES: conjunctiva are pink and non-injected, sclera anicteric OROPHARYNX: MMM, no exudates, no oropharyngeal erythema or ulceration NECK: supple, no JVD LYMPH:  no palpable lymphadenopathy in the cervical, axillary or inguinal regions LUNGS: clear to auscultation b/l with normal respiratory effort HEART: regular rate & rhythm ABDOMEN:  normoactive bowel sounds , non tender, not distended. No palpable hepatosplenomegaly.  Extremity: no pedal edema PSYCH: alert & oriented x 3 with fluent speech NEURO: no focal motor/sensory deficits  LABORATORY DATA:  I have reviewed the data as listed  . CBC Latest Ref Rng & Units 07/05/2020 06/14/2020 06/03/2020  WBC 4.0 - 10.5 K/uL 10.2 8.9 24.5(H)  Hemoglobin 12.0 - 15.0 g/dL 11.2(L) 11.1(L) 11.1(L)  Hematocrit 36.0 - 46.0 % 36.7 36.2 37.1  Platelets 150 - 400 K/uL 366 424(H) 319    . CMP Latest Ref Rng & Units 07/05/2020 06/14/2020 06/03/2020  Glucose 70 - 99 mg/dL 92 95 104(H)  BUN 8 - 23 mg/dL 26(H) 18 17  Creatinine 0.44 - 1.00 mg/dL 1.38(H) 1.34(H) 1.26(H)  Sodium 135 - 145 mmol/L 135 135 137  Potassium 3.5 - 5.1 mmol/L 4.5 4.5 4.4  Chloride 98 - 111 mmol/L 99  100 102  CO2 22 - 32 mmol/L 23 24 25   Calcium 8.9 - 10.3 mg/dL 9.6 9.8 9.5  Total Protein 6.5 - 8.1 g/dL 7.7 7.4 7.3  Total Bilirubin 0.3 - 1.2 mg/dL 0.4 0.3 0.2(L)  Alkaline Phos 38 - 126 U/L 510(H) 369(H) 336(H)  AST 15 - 41 U/L 59(H) 41 46(H)  ALT 0 - 44 U/L 37 29 39   05/03/2020 Surgical Pathology Report ([id number]):    PATHOLOGY      RADIOGRAPHIC STUDIES: I have personally reviewed the radiological images as listed and agreed with the findings in the report. No results found.  ASSESSMENT & PLAN:   Chelsea Baker is a 75 y.o. female with   1) Metastatic lung squamous cell carcinoma Previous h/o right upper lobe squamous cell carcinoma of the lung. Stage I Presenting with a cavitary RUL mass.  Biopsy proven to be squamous cell carcinoma. No overt mediastinal or hilar lymphadenopathy noted. PET/CT showed no overt metastatic disease CT head neg for mets. Patient declined MRI S/p SBRT 60 Gy in 5 fractions 09/01/17 to 09/10/17  CT Chest 10/22/17 shows improvement in the right upper lobe mass after [facility]  03/01/18 CT Chest revealed Continued reduction in spiculated posterior right upper lobe pulmonary nodule. 2. Left upper lobe 1.6 cm spiculated pulmonary nodule is increased in size and density, compatible with malignancy, either a second primary bronchogenic carcinoma or contralateral metastasis. 3. No thoracic adenopathy.  03/18/18 PET/CT revealed Intense metabolic activity associated with enlarging LEFT upper lobe pulmonary nodule is most suggestive primary bronchogenic Carcinoma. 2. Mild to moderate activity associated treated pulmonary nodule in the RIGHT upper lobe with associated moderate metabolic activity in adjacent chest wall favored post radiation change inflammation. 3. Stable additional RIGHT lower lobe pulmonary nodule. 4. No new pulmonary nodules evident. 5. No evidence of metastatic mediastinal adenopathy or distant disease.  07/12/18 CT Chest which revealed 2.2 x  1.2 cm irregular/spiculated posterior right upper lobe nodule, grossly unchanged. 5 x 10 mm left upper lobe nodule, decreased. Aortic Atherosclerosis and Emphysema.  05/16/2019 CT Chest Wo Contrast (Accession [id number]) which revealed "Radiation changes in the posterior right upper lobe and left upper lobe/lingula.10 mm lingular nodule, progressive, suspicious for recurrence."  09/07/2019 CT Chest ([id number]) revealed "1. Recently treated lingular pulmonary nodule is mildly decreased in size. 2. Masslike fibrosis in the posterior right upper lobe is mildly increased, equivocal for continued evolution of postradiation change versus early recurrence. 3. Otherwise no new or progressive metastatic disease in the chest."  04/10/2020 PET/CT ([id number]) revealed "1. Local hypermetabolic nodal metastatic recurrence in the prevascular nodal station and LEFT hilum. 2. Bands of fibrotic change and consolidation in LEFT upper lobe and RIGHT upper lobe with low metabolic activity are favored post therapy benign findings. 3. Unfortunately, distant metastatic disease with new multifocal hypermetabolic HEPATIC METASTASIS."   2) Now with Left upper lobe 1.6 cm spiculated pulmonary nodule- increasing in size. Seen on the 03/18/18 PET/CT as noted above. Newly identified Left enlarging nodule is suspected to be a new primary, and would be considered a new Stage I tumor S/p 54 Gy in 3 fractions 04/06/18 to 04/13/18  3) Dyspnea and rest and on minimal exertion. - much improved CT chest with no PE  significant COPD at baseline -  Needing 2-3 L of oxygen by nasal cannula in hospital. and  is now off oxygen ECHO ef 35-40% -Pulmonary function testing -severe obstruction - optimize rx -noted to have systolic cardiomyopathy - mx Dr Ellyn Hack. -Continue follow up with Dr. Sherrilyn Rist in Pulmonology   4) Poor medical f/u. Past Medical History:  Diagnosis Date  . AAA (abdominal aortic aneurysm) ([place])   .  Diverticulosis   . GERD (gastroesophageal reflux disease)   . Hiatal hernia   . Hypertension   . Hypokalemia   . Lumbar spondylolysis   . Lung cancer ([place]) dx'd 2019  . Lung tumor   . On home O2    2L N/C   4) Protein calorie malnutrition -nutritional consultation  5) Heavy smoker 75-100 pk-yrs -Pt reported on 08/12/17 that she has completely quit smoking and plans to never smoke again.  -Pt has continued smoking cessation as of 03/08/18 and was encouraged to keep up her good work.  PLAN: -Discussed pt labwork today, 07/05/20; blood counts are stable, improved liver enzymes, Bilirubin nml, thyroid labs in progress -The pt has no prohibitive toxicities from continuing C3D1 Carboplatin/Taxol/Pembrolizumab with G-CSF support at this time. -Advised pt we will continuously monitor liver status due to medications  -Advised pt use sports bra to avoid discomfort from her bra wire if needed -Recommend pt get Lidocaine patches or Voltaren gel and KT tape for alleviating pain -Rx Acyclovir and refill Tramadol -Will get a CT chest/abd/pel in 4-5 days -Will have phone call in 1 week -Will see back in 3 weeks with labs and portflush   FOLLOW UP: CT chest/abd/pelvis without contrast in 4-5 days Phone visit with Dr [name] in 1 week Plz schedule C4 of chemo-immunotherapy in 3 week with portflush and labs and MD visit   The total time spent in the appt was 30 minutes and more than 50% was on counseling and direct patient cares, ordering and management of chemo-immunotherapy  All of the patient's questions were answered with apparent satisfaction. The patient knows to call the clinic with any problems, questions or concerns.    Sullivan Lone MD [place] [facility] Hematology/Oncology Physician [facility]  (Office):       [phone] (Work cell):  [phone] (Fax):           [phone]  I, Yevette Edwards, am acting as a scribe for Dr. Sullivan Lone.   .I have reviewed the  above documentation for accuracy and completeness, and I agree with the above. [name] MD

## 2020-07-06 LAB — T4: T4, Total: 9.6 ug/dL (ref 4.5–12.0)

## 2020-07-08 ENCOUNTER — Inpatient Hospital Stay: Payer: PPO

## 2020-07-08 ENCOUNTER — Other Ambulatory Visit: Payer: Self-pay

## 2020-07-08 VITALS — BP 134/76 | HR 100 | Resp 18

## 2020-07-08 DIAGNOSIS — Z7189 Other specified counseling: Secondary | ICD-10-CM

## 2020-07-08 DIAGNOSIS — C787 Secondary malignant neoplasm of liver and intrahepatic bile duct: Secondary | ICD-10-CM

## 2020-07-08 DIAGNOSIS — Z5112 Encounter for antineoplastic immunotherapy: Secondary | ICD-10-CM | POA: Diagnosis not present

## 2020-07-08 MED ORDER — PEGFILGRASTIM-JMDB 6 MG/0.6ML ~~LOC~~ SOSY
PREFILLED_SYRINGE | SUBCUTANEOUS | Status: AC
  Filled 2020-07-08: qty 0.6

## 2020-07-08 MED ORDER — PEGFILGRASTIM-JMDB 6 MG/0.6ML ~~LOC~~ SOSY
6.0000 mg | PREFILLED_SYRINGE | Freq: Once | SUBCUTANEOUS | Status: AC
  Administered 2020-07-08: 6 mg via SUBCUTANEOUS

## 2020-07-08 NOTE — Patient Instructions (Signed)
Pegfilgrastim injection What is this medicine? PEGFILGRASTIM (PEG fil gra stim) is a long-acting granulocyte colony-stimulating factor that stimulates the growth of neutrophils, a type of white blood cell important in the body's fight against infection. It is used to reduce the incidence of fever and infection in patients with certain types of cancer who are receiving chemotherapy that affects the bone marrow, and to increase survival after being exposed to high doses of radiation. This medicine may be used for other purposes; ask your health care provider or pharmacist if you have questions. COMMON BRAND NAME(S): Fulphila, Neulasta, Nyvepria, UDENYCA, Ziextenzo What should I tell my health care provider before I take this medicine? They need to know if you have any of these conditions:  kidney disease  latex allergy  ongoing radiation therapy  sickle cell disease  skin reactions to acrylic adhesives (On-Body Injector only)  an unusual or allergic reaction to pegfilgrastim, filgrastim, other medicines, foods, dyes, or preservatives  pregnant or trying to get pregnant  breast-feeding How should I use this medicine? This medicine is for injection under the skin. If you get this medicine at home, you will be taught how to prepare and give the pre-filled syringe or how to use the On-body Injector. Refer to the patient Instructions for Use for detailed instructions. Use exactly as directed. Tell your healthcare provider immediately if you suspect that the On-body Injector may not have performed as intended or if you suspect the use of the On-body Injector resulted in a missed or partial dose. It is important that you put your used needles and syringes in a special sharps container. Do not put them in a trash can. If you do not have a sharps container, call your pharmacist or healthcare provider to get one. Talk to your pediatrician regarding the use of this medicine in children. While this drug  may be prescribed for selected conditions, precautions do apply. Overdosage: If you think you have taken too much of this medicine contact a poison control center or emergency room at once. NOTE: This medicine is only for you. Do not share this medicine with others. What if I miss a dose? It is important not to miss your dose. Call your doctor or health care professional if you miss your dose. If you miss a dose due to an On-body Injector failure or leakage, a new dose should be administered as soon as possible using a single prefilled syringe for manual use. What may interact with this medicine? Interactions have not been studied. This list may not describe all possible interactions. Give your health care provider a list of all the medicines, herbs, non-prescription drugs, or dietary supplements you use. Also tell them if you smoke, drink alcohol, or use illegal drugs. Some items may interact with your medicine. What should I watch for while using this medicine? Your condition will be monitored carefully while you are receiving this medicine. You may need blood work done while you are taking this medicine. Talk to your health care provider about your risk of cancer. You may be more at risk for certain types of cancer if you take this medicine. If you are going to need a MRI, CT scan, or other procedure, tell your doctor that you are using this medicine (On-Body Injector only). What side effects may I notice from receiving this medicine? Side effects that you should report to your doctor or health care professional as soon as possible:  allergic reactions (skin rash, itching or hives, swelling of   the face, lips, or tongue)  back pain  dizziness  fever  pain, redness, or irritation at site where injected  pinpoint red spots on the skin  red or dark-brown urine  shortness of breath or breathing problems  stomach or side pain, or pain at the shoulder  swelling  tiredness  trouble  passing urine or change in the amount of urine  unusual bruising or bleeding Side effects that usually do not require medical attention (report to your doctor or health care professional if they continue or are bothersome):  bone pain  muscle pain This list may not describe all possible side effects. Call your doctor for medical advice about side effects. You may report side effects to FDA at 1-800-FDA-1088. Where should I keep my medicine? Keep out of the reach of children. If you are using this medicine at home, you will be instructed on how to store it. Throw away any unused medicine after the expiration date on the label. NOTE: This sheet is a summary. It may not cover all possible information. If you have questions about this medicine, talk to your doctor, pharmacist, or health care provider.  2021 Elsevier/Gold Standard (2019-07-07 13:20:51)  

## 2020-07-09 ENCOUNTER — Other Ambulatory Visit: Payer: Self-pay

## 2020-07-10 ENCOUNTER — Ambulatory Visit (INDEPENDENT_AMBULATORY_CARE_PROVIDER_SITE_OTHER): Payer: PPO | Admitting: Medical

## 2020-07-10 ENCOUNTER — Encounter: Payer: Self-pay | Admitting: Medical

## 2020-07-10 VITALS — BP 128/85 | HR 112 | Temp 98.5°F | Resp 18 | Ht 60.0 in | Wt 133.0 lb

## 2020-07-10 DIAGNOSIS — Z87891 Personal history of nicotine dependence: Secondary | ICD-10-CM

## 2020-07-10 DIAGNOSIS — I10 Essential (primary) hypertension: Secondary | ICD-10-CM

## 2020-07-10 DIAGNOSIS — I159 Secondary hypertension, unspecified: Secondary | ICD-10-CM | POA: Diagnosis not present

## 2020-07-10 DIAGNOSIS — J449 Chronic obstructive pulmonary disease, unspecified: Secondary | ICD-10-CM | POA: Diagnosis not present

## 2020-07-10 DIAGNOSIS — C3412 Malignant neoplasm of upper lobe, left bronchus or lung: Secondary | ICD-10-CM

## 2020-07-10 DIAGNOSIS — I420 Dilated cardiomyopathy: Secondary | ICD-10-CM

## 2020-07-10 MED ORDER — METOPROLOL SUCCINATE ER 50 MG PO TB24: ORAL_TABLET | ORAL | 3 refills | Status: AC

## 2020-07-10 NOTE — Progress Notes (Signed)
Subjective:    Patient ID: Chelsea Baker, female    DOB: May 21, 1946, 75 y.o.   MRN: [id number]  HPI  Pt in for first time.  Pt states former pcp in [facility].  Lung cancer dx in 2019 non small cell. Pt states cancer metastasized to her liver. Pt seeing oncologist Dr. [name]. Last saw oncologist on 07-06-2019.     1) Metastatic lung squamous cell carcinoma Previous h/o right upper lobe squamous cell carcinoma of the lung. Stage I Presenting with a cavitary RUL mass.Biopsy proven to be squamous cell carcinoma. No overt mediastinal or hilar lymphadenopathy noted.  Pt has hx of htn. bp moderate well controlled today. Pt is toprol xl 50 mg daily.  Hx of copd. Pt on symbicort and albuterol.  Hx of dilated cardiomyopathy. LV EF: 35% -  40%    Pt getting blood work regularly with oncologist.  Pt has gotten covid vaccines. Not due yet.    Review of Systems  Constitutional: Positive for fatigue. Negative for chills and fever.       Tired from chemo.  HENT: Negative for dental problem, postnasal drip, rhinorrhea, sinus pressure and sinus pain.   Respiratory: Negative for cough, chest tightness, shortness of breath and wheezing.   Cardiovascular: Negative for chest pain and palpitations.  Gastrointestinal: Negative for abdominal distention, abdominal pain, blood in stool, constipation and diarrhea.  Genitourinary: Negative for dysuria, flank pain and frequency.  Musculoskeletal: Negative for back pain.  Skin: Negative for rash.  Neurological: Negative for dizziness and light-headedness.       Takes tramadol for pain. She has rib and leg pain.  Hematological: Negative for adenopathy. Does not bruise/bleed easily.  Psychiatric/Behavioral: Negative for behavioral problems and decreased concentration.     Past Medical History:  Diagnosis Date  . AAA (abdominal aortic aneurysm) ([place])   . Diverticulosis   . GERD (gastroesophageal reflux disease)   . Hiatal hernia   .  Hypertension   . Hypokalemia   . Lumbar spondylolysis   . Lung cancer ([place]) dx'd 2019  . Lung tumor   . On home O2    2L N/C     Social History   Socioeconomic History  . Marital status: Single    Spouse name: Not on file  . Number of children: Not on file  . Years of education: Not on file  . Highest education level: Not on file  Occupational History  . Not on file  Tobacco Use  . Smoking status: Former Smoker    Packs/day: 1.00    Years: 50.00    Pack years: 50.00    Types: Cigarettes    Quit date: 06/2017    Years since quitting: 3.0  . Smokeless tobacco: Never Used  Vaping Use  . Vaping Use: Never used  Substance and Sexual Activity  . Alcohol use: Yes    Comment: rarely  . Drug use: No  . Sexual activity: Not Currently  Other Topics Concern  . Not on file  Social History Narrative  . Not on file   Social Determinants of Health   Financial Resource Strain: Not on file  Food Insecurity: Not on file  Transportation Needs: Not on file  Physical Activity: Not on file  Stress: Not on file  Social Connections: Not on file  Intimate Partner Violence: Not on file    Past Surgical History:  Procedure Laterality Date  . ABDOMINAL AORTIC ANEURYSM REPAIR  10/01/10   endostent repair  .  CATARACT EXTRACTION, BILATERAL    . LUNG BIOPSY    . NM MYOVIEW LTD  2012   Normal Lexiscan Myoview with no ischemia or infarction.  Normal LV function.  EF 67%.  . TONSILLECTOMY    . TRANSTHORACIC ECHOCARDIOGRAM  07/17/2017   EF 35-40%.  Septal mid and basal inferior wall hypokinesis.  Mildly dilated LV.  Mildly reduced EF.  GR 1 DD.  Calcified mitral annulus.     Family History  Problem Relation Age of Onset  . Heart failure Mother   . Heart attack Mother 67       In 15s - 1st MI  . Coronary artery disease Mother   . Coronary artery disease Father        Died at age 73  . Heart attack Father 102  . Heart disease Maternal Grandmother   . Heart disease Maternal Grandfather    . Cancer Neg Hx     No Known Allergies  Current Outpatient Medications on File Prior to Visit  Medication Sig Dispense Refill  . acetaminophen (TYLENOL) 500 MG tablet Take 500 mg by mouth every 6 (six) hours as needed.    Marland Kitchen acyclovir (ZOVIRAX) 400 MG tablet Take 1 tablet (400 mg total) by mouth 2 (two) times daily. 60 tablet 2  . albuterol (VENTOLIN HFA) 108 (90 Base) MCG/ACT inhaler INHALE 2 PUFFS INTO THE LUNGS EVERY 4 (FOUR) HOURS AS NEEDED FOR WHEEZING OR SHORTNESS OF BREATH. 18 g 5  . Baclofen 5 MG TABS Take 1 tablet by mouth 2 (two) times daily as needed. 60 tablet 0  . budesonide (PULMICORT) 0.5 MG/2ML nebulizer solution Take 2 mLs (0.5 mg total) by nebulization 2 (two) times daily. 120 mL 5  . dexamethasone (DECADRON) 4 MG tablet Take 2 tablets (8 mg total) by mouth daily. Start the day after carboplatin chemotherapy for 3 days. 30 tablet 1  . fexofenadine (ALLEGRA) 180 MG tablet Take 1 tablet (180 mg total) by mouth daily. 30 tablet 1  . ipratropium-albuterol (DUONEB) 0.5-2.5 (3) MG/3ML SOLN Take 3 mLs by nebulization every 4 (four) hours as needed. 90 mL 0  . LORazepam (ATIVAN) 0.5 MG tablet Take 1 tablet (0.5 mg total) by mouth every 6 (six) hours as needed (Nausea or vomiting). 30 tablet 0  . metoprolol succinate (TOPROL-XL) 50 MG 24 hr tablet TAKE 1 TAB DAILY. TAKE WITH OR IMMEDIATELY FOLLOWING A MEAL.**PATIENT WILL NEED TO FOLLOW UP WITH MD 60 tablet 0  . ondansetron (ZOFRAN) 8 MG tablet Take 1 tablet (8 mg total) by mouth 2 (two) times daily as needed for refractory nausea / vomiting. Start on day 3 after carboplatin chemo. 30 tablet 1  . prochlorperazine (COMPAZINE) 10 MG tablet Take 1 tablet (10 mg total) by mouth every 6 (six) hours as needed (Nausea or vomiting). 30 tablet 1  . traMADol (ULTRAM) 50 MG tablet Take 1 tablet (50 mg total) by mouth every 6 (six) hours as needed. 30 tablet 0   No current facility-administered medications on file prior to visit.    BP 128/85    Pulse (!) 117   Temp 98.5 F (36.9 C) (Oral)   Resp 18   Ht 5' (1.524 m)   Wt 133 lb (60.3 kg)   SpO2 95%   BMI 25.97 kg/m       Objective:   Physical Exam  General Mental Status- Alert. General Appearance- Not in acute distress.   Skin General: Color- Normal Color. Moisture- Normal Moisture.  Neck  Carotid Arteries- Normal color. Moisture- Normal Moisture. No carotid bruits. No JVD.  Chest and Lung Exam Auscultation: Breath Sounds:-Normal.  Cardiovascular Auscultation:Rythm- Regular. Murmurs & Other Heart Sounds:Auscultation of the heart reveals- No Murmurs.  Abdomen Inspection:-Inspeection Normal. Palpation/Percussion:Note:No mass. Palpation and Percussion of the abdomen reveal- Non Tender, Non Distended + BS, no rebound or guarding.   Neurologic Cranial Nerve exam:- CN III-XII intact(No nystagmus), symmetric smile. Strength:- 5/5 equal and symmetric strength both upper and lower extremities.     Assessment & Plan:  Nice to meet you.  For history of lung cancer continue to follow-up with oncologist.  They will be given routine labs frequently and oncologist I do send reports to PCP.  I reviewed notes when sent.  History of dilated cardiomyopathy.  Ejection fraction on last echo about 2 years ago was in the 30% range.  I will go ahead and refer you to cardiologist at med center.  I think repeat echo would be beneficial.  History of hypertension and blood pressure moderate well controlled today.  Refill Toprol-XL 50mg .  History of smoking.  Glad to hear that you stop.  COPD history.  Lungs clear on exam.  Continue Symbicort and albuterol.  Follow-up in 6 months or as needed.  Mackie Pai, PA-C

## 2020-07-10 NOTE — Patient Instructions (Addendum)
Nice to meet you.  For history of lung cancer continue to follow-up with oncologist.  They will be given routine labs frequently and oncologist I do send reports to PCP.  I reviewed notes when sent.  History of dilated cardiomyopathy.  Ejection fraction on last echo about 2 years ago was in the 30% range.  I will go ahead and refer you to cardiologist at med center.  I think repeat echo would be beneficial.  History of hypertension and blood pressure moderate well controlled today.  Refill Toprol-XL 50mg .  History of smoking.  Glad to hear that you stop.  COPD history.  Lungs clear on exam.  Continue Symbicort and albuterol.  Follow-up in 6 months or as needed.

## 2020-07-11 ENCOUNTER — Telehealth: Payer: Self-pay | Admitting: Cardiology

## 2020-07-11 NOTE — Telephone Encounter (Signed)
Makes sense  Glenetta Hew, MD

## 2020-07-11 NOTE — Telephone Encounter (Signed)
New Message   Pt would like to switch provider from Dr. Ellyn Hack to Dr. Bettina Gavia. Due to location, [place].

## 2020-07-12 ENCOUNTER — Ambulatory Visit: Payer: PPO | Admitting: Hematology

## 2020-07-16 ENCOUNTER — Ambulatory Visit (HOSPITAL_COMMUNITY): Payer: PPO

## 2020-07-16 ENCOUNTER — Telehealth: Payer: Self-pay | Admitting: *Deleted

## 2020-07-16 NOTE — Telephone Encounter (Signed)
Notified by [facility] that patient CT originally on 1/18 r/s to 1/26 due to icy weather on 1/18. Patient had phone appt with Dr [name] on 1/20 to review CT results. Appt for 1/20 cancelled. Patient has appt with Dr. [name] on 1/28 and will review CT results from 1/26 at that time. Contacted patient with information - LVM on named voice mail and encouraged to contact office if questions

## 2020-07-17 NOTE — Telephone Encounter (Signed)
Okay with me 

## 2020-07-17 NOTE — Telephone Encounter (Signed)
error 

## 2020-07-17 NOTE — Telephone Encounter (Signed)
Patient following up.

## 2020-07-18 ENCOUNTER — Inpatient Hospital Stay: Payer: PPO | Admitting: Hematology

## 2020-07-23 DIAGNOSIS — D491 Neoplasm of unspecified behavior of respiratory system: Secondary | ICD-10-CM | POA: Insufficient documentation

## 2020-07-23 DIAGNOSIS — I714 Abdominal aortic aneurysm, without rupture, unspecified: Secondary | ICD-10-CM | POA: Insufficient documentation

## 2020-07-23 DIAGNOSIS — E876 Hypokalemia: Secondary | ICD-10-CM | POA: Insufficient documentation

## 2020-07-23 DIAGNOSIS — K219 Gastro-esophageal reflux disease without esophagitis: Secondary | ICD-10-CM | POA: Insufficient documentation

## 2020-07-23 DIAGNOSIS — C3490 Malignant neoplasm of unspecified part of unspecified bronchus or lung: Secondary | ICD-10-CM | POA: Insufficient documentation

## 2020-07-23 DIAGNOSIS — K5790 Diverticulosis of intestine, part unspecified, without perforation or abscess without bleeding: Secondary | ICD-10-CM | POA: Insufficient documentation

## 2020-07-23 DIAGNOSIS — K449 Diaphragmatic hernia without obstruction or gangrene: Secondary | ICD-10-CM | POA: Insufficient documentation

## 2020-07-23 DIAGNOSIS — Z9981 Dependence on supplemental oxygen: Secondary | ICD-10-CM | POA: Insufficient documentation

## 2020-07-23 DIAGNOSIS — M4306 Spondylolysis, lumbar region: Secondary | ICD-10-CM | POA: Insufficient documentation

## 2020-07-24 ENCOUNTER — Ambulatory Visit (HOSPITAL_COMMUNITY)
Admission: RE | Admit: 2020-07-24 | Discharge: 2020-07-24 | Disposition: A | Discharge status: Home / Self Care | Payer: PPO | Source: Ambulatory Visit | Attending: Hematology | Admitting: Hematology

## 2020-07-24 ENCOUNTER — Other Ambulatory Visit: Payer: Self-pay

## 2020-07-24 DIAGNOSIS — C787 Secondary malignant neoplasm of liver and intrahepatic bile duct: Secondary | ICD-10-CM | POA: Diagnosis not present

## 2020-07-24 DIAGNOSIS — C3411 Malignant neoplasm of upper lobe, right bronchus or lung: Secondary | ICD-10-CM | POA: Diagnosis not present

## 2020-07-24 DIAGNOSIS — J439 Emphysema, unspecified: Secondary | ICD-10-CM | POA: Diagnosis not present

## 2020-07-24 DIAGNOSIS — R160 Hepatomegaly, not elsewhere classified: Secondary | ICD-10-CM | POA: Insufficient documentation

## 2020-07-24 DIAGNOSIS — R0782 Intercostal pain: Secondary | ICD-10-CM | POA: Insufficient documentation

## 2020-07-24 DIAGNOSIS — I700 Atherosclerosis of aorta: Secondary | ICD-10-CM | POA: Insufficient documentation

## 2020-07-24 DIAGNOSIS — R590 Localized enlarged lymph nodes: Secondary | ICD-10-CM | POA: Insufficient documentation

## 2020-07-25 MED FILL — Fosaprepitant Dimeglumine For IV Infusion 150 MG (Base Eq): INTRAVENOUS | Qty: 5 | Status: AC

## 2020-07-25 MED FILL — Dexamethasone Sodium Phosphate Inj 100 MG/10ML: INTRAMUSCULAR | Qty: 1 | Status: AC

## 2020-07-25 NOTE — Progress Notes (Signed)
HEMATOLOGY/ONCOLOGY CLINIC NOTE  Date of Service: 07/25/2020  Patient Care Team: Saguier, Iris Pert as PCP - General (Internal Medicine)  CHIEF COMPLAINTS/PURPOSE OF CONSULTATION:   F/u for recurent squamous cell lung cancer  HISTORY OF PRESENTING ILLNESS:    Chelsea Baker is a wonderful 75 y.o. female who has been referred to [place] by Dr .Sloan Leiter, Henreitta Leber, MD  for evaluation and management of newly diagnosed Squmous cell carcinoma of the RUL.  Patient has a history of hypertension, nicotine dependence, GERD, hiatal hernia, AAA and presented with a 2-week history of cough, weight loss about 20 pounds in about 6 months and increasing shortness of breath over the last several days prior to admission.  In the ED the patient had a chest x-ray which showed a 3.4 cm Which showed a 3.4 cm right upper lobe mass which appeared cavitary.  Subsequent CT of the chest showed a 3.5 cm cavitary mass involving the right upper lobe concerning for a primary bronchogenic carcinoma.  She was also noted to have a 4 mm nodule which is indeterminate in the right lower lobe.  No evidence of metastatic disease noted in the chest or upper abdomen. No evidence of pulmonary embolism.  Patient subsequently had a CT-guided biopsy of the right upper lobe lung lesion on 07/15/2017.  Pathology revealed squamous cell carcinoma of the lung. She had other workup including an AFB smear which was negative.  Patient notes that she does not really have a primary care physician and has not had good medical follow-up and does not really see a doctor regularly. She has had a history of smoking 1/2-2 packs/day for 50 years.  No diagnosed COPD though likely has significant decline in lung function at baseline. Currently was short of breath despite lung cancer not involving a large part of the lung at this time and no evidence of PE.  Still needing 2-3 L of oxygen by nasal cannula currently and endorses shortness of  breath with minimal walking.  She notes that she lives with her son.   INTERVAL HISTORY:  Chelsea Baker is here regarding her lung cancer. She is here today for C4 Carboplatin/Taxol/Pembrolizumab. The patient's last visit with [place] was on 07/05/2020. The pt reports that she is doing well overall.  The pt reports no new symptoms or concerns except her chronic rib pain that is slightly worse as prior visit. She notes her SOB and bruising is improved since last visit.  The pt notes a decreased appetite and change in taste buds. She says that sweet foods and meats do not taste palatable anymore. She experiences a cycle days that she is very hungry followed by days she is not hungry at all.  The pt uses Tramadol at night prn. She tries to avoid usage during the day as it makes her very tired. The pt notes that she uses her Rescue Inhaler BID.   Of note since the patient's last visit, pt has had CT Chest/Abd/Pel wo contrast ([id number]) on 07/24/2020, which revealed "1. Progression of widespread bulky liver metastatic disease with new hepatomegaly. 2. Left hilar and mediastinal adenopathy is slightly decreased. 3. Stable radiation change in the right upper and lingula, with no evidence of local tumor recurrence. 4. Aortic Atherosclerosis (ICD10-I70.0) and Emphysema (ICD10-J43.9)."  Lab results today 07/26/2020 of CBC w/diff and CMP is as follows: all values are WNL except for WBC at 11.2K, Hgb of 11.2, RDW of 16.7, Neutro Abs of 9.1K, Sodium of 134, Creatinine  of 1.96, Albumin of 3.1, AST of 78, Alkaline Phosphatase of 757, GFR est of 26. 07/26/2020 T4 in progress. 07/26/2020 TSH is normal at 3.862. 07/26/2020 Magnesium is normal at 1.7.  On review of systems, pt reports intermittent nausea, rib pain and denies fevers, chills, night sweats, SOB, diarrhea, new skin rashes, abdominal pain and any other symptoms. MEDICAL HISTORY:  Past Medical History:  Diagnosis Date  . AAA (abdominal aortic  aneurysm) ([place])   . AAA (abdominal aortic aneurysm) without rupture ([place]) 04/10/2014  . Abnormal echocardiogram 08/10/2017  . Aftercare following surgery of the circulatory system 04/10/2014  . Aftercare following surgery of the circulatory system, [place] 11/15/2012  . Cavitating mass in right upper lung lobe 07/12/2017  . Chest discomfort 08/10/2017  . Counseling regarding advance care planning and goals of care 05/17/2020  . Dilated cardiomyopathy ([place]) 07/17/2017   Echocardiogram July 17, 2017: EF 35-40%.  Septal mid and basal inferior wall hypokinesis.  Mildly dilated LV.  Mildly reduced EF.  GR 1 DD.  Calcified mitral annulus.    . Diverticulosis   . GERD (gastroesophageal reflux disease)   . Healthcare maintenance 12/21/2017  . Hiatal hernia   . Hyperlipidemia 12/21/2017  . Hypertension   . Hypokalemia   . Hyponatremia 07/12/2017  . Hypoxia   . Lumbar spondylolysis   . Lung cancer ([place]) dx'd 2019  . Lung tumor   . Malignant neoplasm of upper lobe of left lung ([place]) 03/22/2018  . Malignant neoplasm of upper lobe of right lung ([place]) 07/12/2017  . Non-small cell lung cancer metastatic to liver ([place]) 05/17/2020  . On home O2    2L N/C  . Protein-calorie malnutrition ([place])   . Shortness of breath   . Tachycardia 07/12/2017    SURGICAL HISTORY: Past Surgical History:  Procedure Laterality Date  . ABDOMINAL AORTIC ANEURYSM REPAIR  10/01/10   endostent repair  . CATARACT EXTRACTION, BILATERAL    . LUNG BIOPSY    . NM MYOVIEW LTD  2012   Normal Lexiscan Myoview with no ischemia or infarction.  Normal LV function.  EF 67%.  . TONSILLECTOMY    . TRANSTHORACIC ECHOCARDIOGRAM  07/17/2017   EF 35-40%.  Septal mid and basal inferior wall hypokinesis.  Mildly dilated LV.  Mildly reduced EF.  GR 1 DD.  Calcified mitral annulus.     SOCIAL HISTORY: Social History   Socioeconomic History  . Marital status: Single    Spouse name: Not on file  . Number of children: Not on file  . Years of  education: Not on file  . Highest education level: Not on file  Occupational History  . Not on file  Tobacco Use  . Smoking status: Former Smoker    Packs/day: 1.00    Years: 50.00    Pack years: 50.00    Types: Cigarettes    Quit date: 06/2017    Years since quitting: 3.0  . Smokeless tobacco: Never Used  Vaping Use  . Vaping Use: Never used  Substance and Sexual Activity  . Alcohol use: Yes    Comment: rarely  . Drug use: No  . Sexual activity: Not Currently  Other Topics Concern  . Not on file  Social History Narrative  . Not on file   Social Determinants of Health   Financial Resource Strain: Not on file  Food Insecurity: Not on file  Transportation Needs: Not on file  Physical Activity: Not on file  Stress: Not on file  Social Connections:  Not on file  Intimate Partner Violence: Not on file    FAMILY HISTORY: Family History  Problem Relation Age of Onset  . Heart failure Mother   . Heart attack Mother 56       In 58s - 1st MI  . Coronary artery disease Mother   . Coronary artery disease Father        Died at age 52  . Heart attack Father 19  . Heart disease Maternal Grandmother   . Heart disease Maternal Grandfather   . Cancer Neg Hx     ALLERGIES:  has No Known Allergies.  MEDICATIONS:  Current Outpatient Medications  Medication Sig Dispense Refill  . acetaminophen (TYLENOL) 500 MG tablet Take 500 mg by mouth every 6 (six) hours as needed.    Marland Kitchen acyclovir (ZOVIRAX) 400 MG tablet Take 1 tablet (400 mg total) by mouth 2 (two) times daily. 60 tablet 2  . albuterol (VENTOLIN HFA) 108 (90 Base) MCG/ACT inhaler INHALE 2 PUFFS INTO THE LUNGS EVERY 4 (FOUR) HOURS AS NEEDED FOR WHEEZING OR SHORTNESS OF BREATH. 18 g 5  . Baclofen 5 MG TABS Take 1 tablet by mouth 2 (two) times daily as needed. 60 tablet 0  . budesonide (PULMICORT) 0.5 MG/2ML nebulizer solution Take 2 mLs (0.5 mg total) by nebulization 2 (two) times daily. 120 mL 5  . dexamethasone (DECADRON) 4  MG tablet Take 2 tablets (8 mg total) by mouth daily. Start the day after carboplatin chemotherapy for 3 days. 30 tablet 1  . fexofenadine (ALLEGRA) 180 MG tablet Take 1 tablet (180 mg total) by mouth daily. 30 tablet 1  . ipratropium-albuterol (DUONEB) 0.5-2.5 (3) MG/3ML SOLN Take 3 mLs by nebulization every 4 (four) hours as needed. 90 mL 0  . LORazepam (ATIVAN) 0.5 MG tablet Take 1 tablet (0.5 mg total) by mouth every 6 (six) hours as needed (Nausea or vomiting). 30 tablet 0  . metoprolol succinate (TOPROL-XL) 50 MG 24 hr tablet 1 tab po q day 30 tablet 3  . ondansetron (ZOFRAN) 8 MG tablet Take 1 tablet (8 mg total) by mouth 2 (two) times daily as needed for refractory nausea / vomiting. Start on day 3 after carboplatin chemo. 30 tablet 1  . prochlorperazine (COMPAZINE) 10 MG tablet Take 1 tablet (10 mg total) by mouth every 6 (six) hours as needed (Nausea or vomiting). 30 tablet 1  . traMADol (ULTRAM) 50 MG tablet Take 1 tablet (50 mg total) by mouth every 6 (six) hours as needed. 30 tablet 0   No current facility-administered medications for this visit.    REVIEW OF SYSTEMS:   10 Point review of Systems was done is negative except as noted above.   PHYSICAL EXAMINATION: There were no vitals filed for this visit. Wt Readings from Last 3 Encounters:  07/10/20 133 lb (60.3 kg)  07/05/20 135 lb 6.4 oz (61.4 kg)  06/03/20 141 lb 4.8 oz (64.1 kg)   There is no height or weight on file to calculate BMI.     GENERAL:alert, in no acute distress and comfortable SKIN: no acute rashes, no significant lesions EYES: conjunctiva are pink and non-injected, sclera anicteric OROPHARYNX: MMM, no exudates, no oropharyngeal erythema or ulceration NECK: supple, no JVD LYMPH:  no palpable lymphadenopathy in the cervical, axillary or inguinal regions LUNGS: clear to auscultation b/l with normal respiratory effort HEART: regular rate & rhythm ABDOMEN:  normoactive bowel sounds , non tender, not  distended. Extremity: no pedal edema PSYCH: alert &  oriented x 3 with fluent speech NEURO: no focal motor/sensory deficits  LABORATORY DATA:  I have reviewed the data as listed  . CBC Latest Ref Rng & Units 07/05/2020 06/14/2020 06/03/2020  WBC 4.0 - 10.5 K/uL 10.2 8.9 24.5(H)  Hemoglobin 12.0 - 15.0 g/dL 11.2(L) 11.1(L) 11.1(L)  Hematocrit 36.0 - 46.0 % 36.7 36.2 37.1  Platelets 150 - 400 K/uL 366 424(H) 319    . CMP Latest Ref Rng & Units 07/05/2020 06/14/2020 06/03/2020  Glucose 70 - 99 mg/dL 92 95 104(H)  BUN 8 - 23 mg/dL 26(H) 18 17  Creatinine 0.44 - 1.00 mg/dL 1.38(H) 1.34(H) 1.26(H)  Sodium 135 - 145 mmol/L 135 135 137  Potassium 3.5 - 5.1 mmol/L 4.5 4.5 4.4  Chloride 98 - 111 mmol/L 99 100 102  CO2 22 - 32 mmol/L 23 24 25   Calcium 8.9 - 10.3 mg/dL 9.6 9.8 9.5  Total Protein 6.5 - 8.1 g/dL 7.7 7.4 7.3  Total Bilirubin 0.3 - 1.2 mg/dL 0.4 0.3 0.2(L)  Alkaline Phos 38 - 126 U/L 510(H) 369(H) 336(H)  AST 15 - 41 U/L 59(H) 41 46(H)  ALT 0 - 44 U/L 37 29 39   05/03/2020 Surgical Pathology Report ([id number]):    PATHOLOGY      RADIOGRAPHIC STUDIES: I have personally reviewed the radiological images as listed and agreed with the findings in the report. CT CHEST ABDOMEN PELVIS WO CONTRAST  Result Date: 07/24/2020 CLINICAL DATA:  Stage IV squamous cell right upper lobe lung cancer diagnosed in 2019 status post radiation therapy x 3 with liver metastases and ongoing palliative chemotherapy. Patient reports new posterior right chest wall pain and tenderness. No reported injury. EXAM: CT CHEST, ABDOMEN AND PELVIS WITHOUT CONTRAST TECHNIQUE: Multidetector CT imaging of the chest, abdomen and pelvis was performed following the standard protocol without IV contrast. COMPARISON:  04/10/2020 PET-CT. 03/11/2020 chest CT. 07/17/2017 CT abdomen/pelvis. FINDINGS: CT CHEST FINDINGS Cardiovascular: Normal heart size. No significant pericardial effusion/thickening. Three-vessel coronary  atherosclerosis. Atherosclerotic nonaneurysmal thoracic aorta. Normal caliber pulmonary arteries. Mediastinum/Nodes: No discrete thyroid nodules. Unremarkable esophagus. No axillary adenopathy. Mildly enlarged 1.1 cm short axis diameter AP window node (series 2/image 19), previously 1.3 cm on 04/10/2020 PET-CT, slightly decreased. No new pathologically enlarged mediastinal nodes. Enlarged 1.8 cm left hilar node (series 2/image 27), previously 2.1 cm, slightly decreased. No right hilar adenopathy. Lungs/Pleura: No pneumothorax. No pleural effusion. Mild centrilobular emphysema. No acute consolidative airspace disease. Stable sharply marginated dense triangular consolidation in the posterior right upper lobe with associated volume loss and distortion, compatible with radiation change. Stable sharply marginated irregular bandlike consolidation in the lingula with associated volume loss and distortion, compatible with radiation change. Clustered tiny superior segment right lower lobe solid pulmonary nodules measuring up to 3 mm (series 4/image 44), stable. No new significant pulmonary nodules. Musculoskeletal: No aggressive appearing focal osseous lesions. Chronic healed posterior right fifth rib deformity. No acute fracture in the chest. Marked thoracic spondylosis. CT ABDOMEN PELVIS FINDINGS Hepatobiliary: New hepatomegaly with numerous (greater than 10) ill-defined heterogeneous hypodense liver masses scattered throughout the liver, increased in size and number since 04/10/2020 PET-CT. Representative segment 2 left liver 4.7 cm mass (series 2/image 54), new. Representative segment 3 left liver 3.2 cm mass (series 2/image 67), increased from 2.0 cm. Representative central right liver 4.5 cm mass (series 2/image 49), increased from 3.7 cm. Representative peripheral right liver 2.4 cm mass (series 2/image 48), new. Normal gallbladder with no radiopaque cholelithiasis. No biliary ductal dilatation. Pancreas: Normal, with  no mass or duct dilation. Spleen: Normal size. No mass. Adrenals/Urinary Tract: Normal adrenals. No renal stones. No hydronephrosis. No contour deforming renal masses. Small cystocele. Otherwise normal nondistended bladder. Stomach/Bowel: Normal non-distended stomach. Normal caliber small bowel with no small bowel wall thickening. Normal appendix. Marked sigmoid diverticulosis with no large bowel wall thickening or significant pericolonic fat stranding. Vascular/Lymphatic: Atherosclerotic abdominal aorta with 3.1 cm infrarenal abdominal aortic aneurysm status post aorto bi-iliac stent graft. No pathologically enlarged lymph nodes in the abdomen or pelvis. Reproductive: Grossly normal uterus.  No adnexal mass. Other: No pneumoperitoneum, ascites or focal fluid collection. Musculoskeletal: No aggressive appearing focal osseous lesions. Mild lumbar spondylosis. IMPRESSION: 1. Progression of widespread bulky liver metastatic disease with new hepatomegaly. 2. Left hilar and mediastinal adenopathy is slightly decreased. 3. Stable radiation change in the right upper and lingula, with no evidence of local tumor recurrence. 4. Aortic Atherosclerosis (ICD10-I70.0) and Emphysema (ICD10-J43.9). Electronically Signed   By: Ilona Sorrel M.D.   On: 07/24/2020 17:03    ASSESSMENT & PLAN:   JODE LIPPE is a 75 y.o. female with   1) Metastatic lung squamous cell carcinoma Previous h/o right upper lobe squamous cell carcinoma of the lung. Stage I Presenting with a cavitary RUL mass.  Biopsy proven to be squamous cell carcinoma. No overt mediastinal or hilar lymphadenopathy noted. PET/CT showed no overt metastatic disease CT head neg for mets. Patient declined MRI S/p SBRT 60 Gy in 5 fractions 09/01/17 to 09/10/17  CT Chest 10/22/17 shows improvement in the right upper lobe mass after [facility]  03/01/18 CT Chest revealed Continued reduction in spiculated posterior right upper lobe pulmonary nodule. 2. Left upper lobe 1.6 cm  spiculated pulmonary nodule is increased in size and density, compatible with malignancy, either a second primary bronchogenic carcinoma or contralateral metastasis. 3. No thoracic adenopathy.  03/18/18 PET/CT revealed Intense metabolic activity associated with enlarging LEFT upper lobe pulmonary nodule is most suggestive primary bronchogenic Carcinoma. 2. Mild to moderate activity associated treated pulmonary nodule in the RIGHT upper lobe with associated moderate metabolic activity in adjacent chest wall favored post radiation change inflammation. 3. Stable additional RIGHT lower lobe pulmonary nodule. 4. No new pulmonary nodules evident. 5. No evidence of metastatic mediastinal adenopathy or distant disease.  07/12/18 CT Chest which revealed 2.2 x 1.2 cm irregular/spiculated posterior right upper lobe nodule, grossly unchanged. 5 x 10 mm left upper lobe nodule, decreased. Aortic Atherosclerosis and Emphysema.  05/16/2019 CT Chest Wo Contrast (Accession [id number]) which revealed "Radiation changes in the posterior right upper lobe and left upper lobe/lingula.10 mm lingular nodule, progressive, suspicious for recurrence."  09/07/2019 CT Chest ([id number]) revealed "1. Recently treated lingular pulmonary nodule is mildly decreased in size. 2. Masslike fibrosis in the posterior right upper lobe is mildly increased, equivocal for continued evolution of postradiation change versus early recurrence. 3. Otherwise no new or progressive metastatic disease in the chest."  04/10/2020 PET/CT ([id number]) revealed "1. Local hypermetabolic nodal metastatic recurrence in the prevascular nodal station and LEFT hilum. 2. Bands of fibrotic change and consolidation in LEFT upper lobe and RIGHT upper lobe with low metabolic activity are favored post therapy benign findings. 3. Unfortunately, distant metastatic disease with new multifocal hypermetabolic HEPATIC METASTASIS."   2) Now with Left upper lobe 1.6 cm spiculated  pulmonary nodule- increasing in size. Seen on the 03/18/18 PET/CT as noted above. Newly identified Left enlarging nodule is suspected to be a new primary, and would be considered a new Stage I tumor  S/p 54 Gy in 3 fractions 04/06/18 to 04/13/18  3) Dyspnea and rest and on minimal exertion. - much improved CT chest with no PE  significant COPD at baseline -  Needing 2-3 L of oxygen by nasal cannula in hospital. and is now off oxygen ECHO ef 35-40% -Pulmonary function testing -severe obstruction - optimize rx -noted to have systolic cardiomyopathy - mx Dr Ellyn Hack. -Continue follow up with Dr. Sherrilyn Rist in Pulmonology   4) Poor medical f/u. Past Medical History:  Diagnosis Date  . AAA (abdominal aortic aneurysm) ([place])   . AAA (abdominal aortic aneurysm) without rupture ([place]) 04/10/2014  . Abnormal echocardiogram 08/10/2017  . Aftercare following surgery of the circulatory system 04/10/2014  . Aftercare following surgery of the circulatory system, [place] 11/15/2012  . Cavitating mass in right upper lung lobe 07/12/2017  . Chest discomfort 08/10/2017  . Counseling regarding advance care planning and goals of care 05/17/2020  . Dilated cardiomyopathy ([place]) 07/17/2017   Echocardiogram July 17, 2017: EF 35-40%.  Septal mid and basal inferior wall hypokinesis.  Mildly dilated LV.  Mildly reduced EF.  GR 1 DD.  Calcified mitral annulus.    . Diverticulosis   . GERD (gastroesophageal reflux disease)   . Healthcare maintenance 12/21/2017  . Hiatal hernia   . Hyperlipidemia 12/21/2017  . Hypertension   . Hypokalemia   . Hyponatremia 07/12/2017  . Hypoxia   . Lumbar spondylolysis   . Lung cancer ([place]) dx'd 2019  . Lung tumor   . Malignant neoplasm of upper lobe of left lung ([place]) 03/22/2018  . Malignant neoplasm of upper lobe of right lung ([place]) 07/12/2017  . Non-small cell lung cancer metastatic to liver ([place]) 05/17/2020  . On home O2    2L N/C  . Protein-calorie malnutrition ([place])   .  Shortness of breath   . Tachycardia 07/12/2017   4) Protein calorie malnutrition -nutritional consultation  5) Heavy smoker 75-100 pk-yrs -Pt reported on 08/12/17 that she has completely quit smoking and plans to never smoke again.  -Pt has continued smoking cessation as of 03/08/18 and was encouraged to keep up her good work.  PLAN: -Discussed pt labwork today, 07/26/2020; blood counts stable, chemistries show slight dehydration and liver enzymes creeping up -Discussed CT Chest/Abd/Pel wo contrast ([id number]) on 07/24/2020; tumor is progressing and not behaving as standard. The lack of response is either not responding to standard treatments and growing through primary treatments or if this is a tumor from the liver itself and so poorly differentiated. -Discussed possible approaches moving forward-- get Bm Bx, tumor markers, and blood tests to figure out exact cancer and future treatments or pursuing best supportive cares without further workup/treatment. The pt wishes to continue to explore options. -Discussed short-term use of Dexamethasone steroid for pain relief. The pt wishes to start this. -Discussed use of Marinol for increasing appetite. The pt is not desiring this but notes she uses a spray-in-mouth CBD spray that does not aid her very much.  -Discussed palliative care service at home. The pt wishes to pursue this option for additional help. -Will draw blood for tumor markers. -Will hold Carboplatin/Taxol today. Continue Pembrolizumab. Hold Udenyca shot.    The total time spent in the appointment was 30 minutes and more than 50% was on counseling and direct patient cares.   All of the patient's questions were answered with apparent satisfaction. The patient knows to call the clinic with any problems, questions or concerns.    Sullivan Lone MD  MS AAHIVMS [facility] Hematology/Oncology Physician [place]  (Office):       [phone] (Work cell):   [phone] (Fax):           [phone]  I, Reinaldo Raddle, am acting as scribe for Dr. Sullivan Lone, MD.     .I have reviewed the above documentation for accuracy and completeness, and I agree with the above. [name] MD     ADDENDUM Component     Latest Ref Rng & Units 07/26/2020  AFP, Serum, Tumor Marker     0.0 - 8.3 ng/mL 3.3  CA 19-9     0 - 35 U/mL 30  CEA ([facility]In [facility])     0.00 - 5.00 ng/mL 4.69   -tumor marker are unrevealing and do not suggest primary HCC. -Palliative care referral placed -will inform pathology to see if there is enough tissue for foundation one studies and tissue of origin analysis on her liver metastases biopsy sample from previously  .[name] MD

## 2020-07-26 ENCOUNTER — Other Ambulatory Visit: Payer: PPO

## 2020-07-26 ENCOUNTER — Other Ambulatory Visit: Payer: Self-pay

## 2020-07-26 ENCOUNTER — Inpatient Hospital Stay: Payer: PPO

## 2020-07-26 ENCOUNTER — Inpatient Hospital Stay: Payer: PPO | Admitting: Hematology

## 2020-07-26 VITALS — BP 135/79 | HR 94 | Temp 98.1°F | Resp 20 | Ht 60.0 in | Wt 131.0 lb

## 2020-07-26 DIAGNOSIS — C3490 Malignant neoplasm of unspecified part of unspecified bronchus or lung: Secondary | ICD-10-CM | POA: Diagnosis not present

## 2020-07-26 DIAGNOSIS — Z7189 Other specified counseling: Secondary | ICD-10-CM

## 2020-07-26 DIAGNOSIS — C787 Secondary malignant neoplasm of liver and intrahepatic bile duct: Secondary | ICD-10-CM | POA: Diagnosis not present

## 2020-07-26 DIAGNOSIS — Z5112 Encounter for antineoplastic immunotherapy: Secondary | ICD-10-CM | POA: Diagnosis not present

## 2020-07-26 LAB — CMP (CANCER CENTER ONLY)
ALT: 41 U/L (ref 0–44)
AST: 78 U/L — ABNORMAL HIGH (ref 15–41)
Albumin: 3.1 g/dL — ABNORMAL LOW (ref 3.5–5.0)
Alkaline Phosphatase: 757 U/L — ABNORMAL HIGH (ref 38–126)
Anion gap: 10 (ref 5–15)
BUN: 19 mg/dL (ref 8–23)
CO2: 26 mmol/L (ref 22–32)
Calcium: 9.1 mg/dL (ref 8.9–10.3)
Chloride: 98 mmol/L (ref 98–111)
Creatinine: 1.96 mg/dL — ABNORMAL HIGH (ref 0.44–1.00)
GFR, Estimated: 26 mL/min — ABNORMAL LOW (ref >60)
Glucose, Bld: 97 mg/dL (ref 70–99)
Potassium: 4.9 mmol/L (ref 3.5–5.1)
Sodium: 134 mmol/L — ABNORMAL LOW (ref 135–145)
Total Bilirubin: 0.6 mg/dL (ref 0.3–1.2)
Total Protein: 6.7 g/dL (ref 6.5–8.1)

## 2020-07-26 LAB — CBC WITH DIFFERENTIAL/PLATELET
Abs Immature Granulocytes: 0.06 10*3/uL (ref 0.00–0.07)
Basophils Absolute: 0.1 10*3/uL (ref 0.0–0.1)
Basophils Relative: 1 %
Eosinophils Absolute: 0 10*3/uL (ref 0.0–0.5)
Eosinophils Relative: 0 %
HCT: 37 % (ref 36.0–46.0)
Hemoglobin: 11.2 g/dL — ABNORMAL LOW (ref 12.0–15.0)
Immature Granulocytes: 1 %
Lymphocytes Relative: 10 %
Lymphs Abs: 1.1 10*3/uL (ref 0.7–4.0)
MCH: 27.4 pg (ref 26.0–34.0)
MCHC: 30.3 g/dL (ref 30.0–36.0)
MCV: 90.5 fL (ref 80.0–100.0)
Monocytes Absolute: 0.8 10*3/uL (ref 0.1–1.0)
Monocytes Relative: 7 %
Neutro Abs: 9.1 10*3/uL — ABNORMAL HIGH (ref 1.7–7.7)
Neutrophils Relative %: 81 %
Platelets: 346 10*3/uL (ref 150–400)
RBC: 4.09 MIL/uL (ref 3.87–5.11)
RDW: 16.7 % — ABNORMAL HIGH (ref 11.5–15.5)
WBC: 11.2 10*3/uL — ABNORMAL HIGH (ref 4.0–10.5)
nRBC: 0 % (ref 0.0–0.2)

## 2020-07-26 LAB — MAGNESIUM: Magnesium: 1.7 mg/dL (ref 1.7–2.4)

## 2020-07-26 LAB — CEA (IN HOUSE-CHCC): CEA (CHCC-In House): 4.69 ng/mL (ref 0.00–5.00)

## 2020-07-26 LAB — TSH: TSH: 3.862 u[IU]/mL (ref 0.308–3.960)

## 2020-07-26 MED ORDER — SODIUM CHLORIDE 0.9 % IV SOLN
Freq: Once | INTRAVENOUS | Status: AC
  Filled 2020-07-26: qty 250

## 2020-07-26 MED ORDER — SODIUM CHLORIDE 0.9 % IV SOLN
200.0000 mg | Freq: Once | INTRAVENOUS | Status: AC
  Administered 2020-07-26: 200 mg via INTRAVENOUS
  Filled 2020-07-26: qty 8

## 2020-07-26 MED ORDER — ONDANSETRON HCL 4 MG/2ML IJ SOLN
INTRAMUSCULAR | Status: AC
  Filled 2020-07-26: qty 2

## 2020-07-26 MED ORDER — ONDANSETRON HCL 4 MG/2ML IJ SOLN
4.0000 mg | Freq: Once | INTRAMUSCULAR | Status: AC
  Administered 2020-07-26: 4 mg via INTRAVENOUS

## 2020-07-26 NOTE — Progress Notes (Signed)
Okay to treat with elevated creat per Dr. Kale 

## 2020-07-26 NOTE — Patient Instructions (Signed)
[facility] Discharge Instructions for Patients Receiving Chemotherapy  Today you received the following chemotherapy agents :  Keytruda.  To help prevent nausea and vomiting after your treatment, we encourage you to take your nausea medication as prescribed.   If you develop nausea and vomiting that is not controlled by your nausea medication, call the clinic.   BELOW ARE SYMPTOMS THAT SHOULD BE REPORTED IMMEDIATELY:  *FEVER GREATER THAN 100.5 F  *CHILLS WITH OR WITHOUT FEVER  NAUSEA AND VOMITING THAT IS NOT CONTROLLED WITH YOUR NAUSEA MEDICATION  *UNUSUAL SHORTNESS OF BREATH  *UNUSUAL BRUISING OR BLEEDING  TENDERNESS IN MOUTH AND THROAT WITH OR WITHOUT PRESENCE OF ULCERS  *URINARY PROBLEMS  *BOWEL PROBLEMS  UNUSUAL RASH Items with * indicate a potential emergency and should be followed up as soon as possible.  Feel free to call the clinic should you have any questions or concerns. The clinic phone number is [phone].  Please show the CHEMO ALERT CARD at check-in to the Emergency Department and triage nurse.  

## 2020-07-27 LAB — AFP TUMOR MARKER: AFP, Serum, Tumor Marker: 3.3 ng/mL (ref 0.0–8.3)

## 2020-07-27 LAB — CANCER ANTIGEN 19-9: CA 19-9: 30 U/mL (ref 0–35)

## 2020-07-27 LAB — T4: T4, Total: 10.7 ug/dL (ref 4.5–12.0)

## 2020-07-29 ENCOUNTER — Other Ambulatory Visit: Payer: Self-pay | Admitting: Hematology

## 2020-07-29 ENCOUNTER — Telehealth: Payer: Self-pay

## 2020-07-29 DIAGNOSIS — C787 Secondary malignant neoplasm of liver and intrahepatic bile duct: Secondary | ICD-10-CM

## 2020-07-29 DIAGNOSIS — C3490 Malignant neoplasm of unspecified part of unspecified bronchus or lung: Secondary | ICD-10-CM

## 2020-07-29 DIAGNOSIS — Z7189 Other specified counseling: Secondary | ICD-10-CM

## 2020-07-29 MED ORDER — TRAMADOL HCL 50 MG PO TABS: 50.0000 mg | ORAL_TABLET | Freq: Four times a day (QID) | ORAL | 0 refills | Status: AC | PRN

## 2020-07-29 MED ORDER — DEXAMETHASONE 4 MG PO TABS: 4.0000 mg | ORAL_TABLET | Freq: Two times a day (BID) | ORAL | 1 refills | Status: DC

## 2020-07-29 NOTE — Telephone Encounter (Signed)
TCT patient and LVM regarding Tramadol and Decadron medication refills.

## 2020-08-01 ENCOUNTER — Other Ambulatory Visit: Payer: Self-pay

## 2020-08-01 ENCOUNTER — Ambulatory Visit: Payer: PPO | Admitting: Cardiology

## 2020-08-01 ENCOUNTER — Encounter: Payer: Self-pay | Admitting: Cardiology

## 2020-08-01 VITALS — BP 108/76 | HR 52 | Ht 60.0 in | Wt 128.8 lb

## 2020-08-01 DIAGNOSIS — I420 Dilated cardiomyopathy: Secondary | ICD-10-CM

## 2020-08-01 DIAGNOSIS — I10 Essential (primary) hypertension: Secondary | ICD-10-CM

## 2020-08-01 DIAGNOSIS — C3490 Malignant neoplasm of unspecified part of unspecified bronchus or lung: Secondary | ICD-10-CM | POA: Diagnosis not present

## 2020-08-01 NOTE — Progress Notes (Unsigned)
Cardiology Office Note:    Date:  08/01/2020   ID:  Chelsea, Baker 05/11/1946, MRN [id number]  PCP:  Mackie Pai, PA-C  Cardiologist:  Shirlee More, MD    Referring MD: Mackie Pai, PA-C    ASSESSMENT:    1. Dilated cardiomyopathy ([place])   2. Primary hypertension   3. Malignant neoplasm of lung, unspecified laterality, unspecified part of lung ([place])    PLAN:    In order of problems listed above:  1. Her clinical problem is cardiomyopathy mild in the setting of lung cancer radiation chemotherapy and immunotherapy.  It blocker and seems to have done well without particular signs or symptoms of heart failure and I suspect her shortness of breath continues to be pulmonary in etiology.  I told her I think the single best test to do was an echocardiogram to restage her cardiomyopathy.  Unless her EF is less than 40 I would consider vasodilators or Entresto at this time.  I will also ask the oncology team to check a proBNP level with her next labs.  She will come back in 6 weeks to follow-up with me 2. Stable continue her beta-blocker 3. Managed treated by oncology   Next appointment: 6 weeks   Medication Adjustments/Labs and Tests Ordered: Current medicines are reviewed at length with the patient today.  Concerns regarding medicines are outlined above.  No orders of the defined types were placed in this encounter.  No orders of the defined types were placed in this encounter.   Chief Complaint  Patient presents with  . Cardiomyopathy    History of Present Illness:    Chelsea Baker is a 75 y.o. female with a hx of cardiomyopathy last seen by my partner Dr. Ellyn Hack February 2019. gf Compliance with diet, lifestyle and medications: Yes  She is struggling today after her treatment with immunotherapy. She is intermittently short of breath with or without activity relieved with her bronchodilator greater than ADLs and walking in the house. She has no edema  orthopnea chest pain palpitation or syncope. She tells me her cardiomyopathy preceded radiation therapy and chemotherapy.   She is followed by oncology recurrent squamous cell lung cancer.  She is being treated with radiation therapy and chemotherapy.  She recently had herpes zoster cutaneous.  Her lung cancer is metastatic she is noted to have a long standing history of cigarette smoking 75 -100 pack years she continues smoking has chronic lung disease on CT scan and she has shortness of breath with activity.  Review shows echocardiogram January 2019 EF 35 to 40% myocardial perfusion imaging performed January 2019 showing normal perfusion and EF of 48%. CT angiogram of the chest in 2019 that showed calcification of the aortic arch and findings of her lung cancer. She had been seen by Dr. Ellyn Hack February 2019 and was treated with a combination of carvedilol ACE inhibitor and simvastatin which was stopped by the patient.  Other problems include abdominal aortic aneurysm status post EVAR and it was the impression that the likely culprit of shortness of breath was more lung disease than cardiac. Recent labs show mild anemia hemoglobin 11.2 mild hyponatremia sodium 134 chronic kidney disease creatinine 1.96 GFR stage IV 26 cc/min potassium 4.9 magnesium normal 1.7 TSH normal. An EKG performed 06/17/2020 showing sinus tachycardia marked baseline artifact is seen otherwise unremarkable EKG heart rate 126 bpm. Most recent CT chest and abdomen showed progression of widely metastatic bulky liver disease with no hepatomegaly mediastinal left hilar adenopathy  slightly decreased radiation change in the right upper lobe and lingula and emphysema.  There is no pleural effusion.  Past Medical History:  Diagnosis Date  . AAA (abdominal aortic aneurysm) ([place])   . AAA (abdominal aortic aneurysm) without rupture ([place]) 04/10/2014  . Abnormal echocardiogram 08/10/2017  . Aftercare following surgery of the circulatory  system 04/10/2014  . Aftercare following surgery of the circulatory system, [place] 11/15/2012  . Cavitating mass in right upper lung lobe 07/12/2017  . Chest discomfort 08/10/2017  . Counseling regarding advance care planning and goals of care 05/17/2020  . Dilated cardiomyopathy ([place]) 07/17/2017   Echocardiogram July 17, 2017: EF 35-40%.  Septal mid and basal inferior wall hypokinesis.  Mildly dilated LV.  Mildly reduced EF.  GR 1 DD.  Calcified mitral annulus.    . Diverticulosis   . GERD (gastroesophageal reflux disease)   . Healthcare maintenance 12/21/2017  . Hiatal hernia   . Hyperlipidemia 12/21/2017  . Hypertension   . Hypokalemia   . Hyponatremia 07/12/2017  . Hypoxia   . Lumbar spondylolysis   . Lung cancer ([place]) dx'd 2019  . Lung tumor   . Malignant neoplasm of upper lobe of left lung ([place]) 03/22/2018  . Malignant neoplasm of upper lobe of right lung ([place]) 07/12/2017  . Non-small cell lung cancer metastatic to liver ([place]) 05/17/2020  . On home O2    2L N/C  . Protein-calorie malnutrition ([place])   . Shortness of breath   . Tachycardia 07/12/2017    Past Surgical History:  Procedure Laterality Date  . ABDOMINAL AORTIC ANEURYSM REPAIR  10/01/10   endostent repair  . CATARACT EXTRACTION, BILATERAL    . LUNG BIOPSY    . NM MYOVIEW LTD  2012   Normal Lexiscan Myoview with no ischemia or infarction.  Normal LV function.  EF 67%.  . TONSILLECTOMY    . TRANSTHORACIC ECHOCARDIOGRAM  07/17/2017   EF 35-40%.  Septal mid and basal inferior wall hypokinesis.  Mildly dilated LV.  Mildly reduced EF.  GR 1 DD.  Calcified mitral annulus.     Current Medications: Current Meds  Medication Sig  . acetaminophen (TYLENOL) 500 MG tablet Take 500 mg by mouth every 6 (six) hours as needed.  Marland Kitchen acyclovir (ZOVIRAX) 400 MG tablet Take 1 tablet (400 mg total) by mouth 2 (two) times daily.  Marland Kitchen albuterol (VENTOLIN HFA) 108 (90 Base) MCG/ACT inhaler INHALE 2 PUFFS INTO THE LUNGS EVERY 4 (FOUR) HOURS AS  NEEDED FOR WHEEZING OR SHORTNESS OF BREATH.  . Baclofen 5 MG TABS Take 1 tablet by mouth 2 (two) times daily as needed.  . budesonide (PULMICORT) 0.5 MG/2ML nebulizer solution Take 2 mLs (0.5 mg total) by nebulization 2 (two) times daily.  Marland Kitchen dexamethasone (DECADRON) 4 MG tablet Take 1 tablet (4 mg total) by mouth 2 (two) times daily with breakfast and lunch. Start the day after carboplatin chemotherapy for 3 days.  . fexofenadine (ALLEGRA) 180 MG tablet Take 1 tablet (180 mg total) by mouth daily.  Marland Kitchen ipratropium-albuterol (DUONEB) 0.5-2.5 (3) MG/3ML SOLN Take 3 mLs by nebulization every 4 (four) hours as needed.  Marland Kitchen LORazepam (ATIVAN) 0.5 MG tablet Take 1 tablet (0.5 mg total) by mouth every 6 (six) hours as needed (Nausea or vomiting).  . metoprolol succinate (TOPROL-XL) 50 MG 24 hr tablet 1 tab po q day  . ondansetron (ZOFRAN) 8 MG tablet Take 1 tablet (8 mg total) by mouth 2 (two) times daily as needed for refractory nausea /  vomiting. Start on day 3 after carboplatin chemo.  . prochlorperazine (COMPAZINE) 10 MG tablet Take 1 tablet (10 mg total) by mouth every 6 (six) hours as needed (Nausea or vomiting).  . traMADol (ULTRAM) 50 MG tablet Take 1 tablet (50 mg total) by mouth every 6 (six) hours as needed.     Allergies:   Patient has no known allergies.   Social History   Socioeconomic History  . Marital status: Single    Spouse name: Not on file  . Number of children: Not on file  . Years of education: Not on file  . Highest education level: Not on file  Occupational History  . Not on file  Tobacco Use  . Smoking status: Former Smoker    Packs/day: 1.00    Years: 50.00    Pack years: 50.00    Types: Cigarettes    Quit date: 06/2017    Years since quitting: 3.0  . Smokeless tobacco: Never Used  Vaping Use  . Vaping Use: Never used  Substance and Sexual Activity  . Alcohol use: Yes    Comment: rarely  . Drug use: No  . Sexual activity: Not Currently  Other Topics Concern   . Not on file  Social History Narrative  . Not on file   Social Determinants of Health   Financial Resource Strain: Not on file  Food Insecurity: Not on file  Transportation Needs: Not on file  Physical Activity: Not on file  Stress: Not on file  Social Connections: Not on file     Family History: The patient's family history includes Coronary artery disease in her father and mother; Heart attack (age of onset: 65) in her father; Heart attack (age of onset: 14) in her mother; Heart disease in her maternal grandfather and maternal grandmother; Heart failure in her mother. There is no history of Cancer. ROS:   Please see the history of present illness.    All other systems reviewed and are negative.  EKGs/Labs/Other Studies Reviewed:    The following studies were reviewed today:   Recent Labs: 07/26/2020: ALT 41; BUN 19; Creatinine 1.96; Hemoglobin 11.2; Magnesium 1.7; Platelets 346; Potassium 4.9; Sodium 134; TSH 3.862  Recent Lipid Panel    Component Value Date/Time   CHOL 249 (H) 12/01/2018 0852   TRIG 253 (H) 12/01/2018 0852   HDL 49 12/01/2018 0852   CHOLHDL 5.1 (H) 12/01/2018 0852   CHOLHDL 5.9 09/26/2010 0420   VLDL 32 09/26/2010 0420   LDLCALC 149 (H) 12/01/2018 0852    Physical Exam:    VS:  BP 108/76   Pulse (!) 52   Ht 5' (1.524 m)   Wt 128 lb 12.8 oz (58.4 kg)   SpO2 99%   BMI 25.15 kg/m     Wt Readings from Last 3 Encounters:  08/01/20 128 lb 12.8 oz (58.4 kg)  07/26/20 131 lb (59.4 kg)  07/10/20 133 lb (60.3 kg)     GEN: She looks her age quite fatigued today well nourished, well developed in no acute distress HEENT: Normal NECK: No JVD; No carotid bruits LYMPHATICS: No lymphadenopathy CARDIAC: RRR, no murmurs, rubs, gallops RESPIRATORY: Distant heart and breath sounds ABDOMEN: Soft, non-tender, non-distended MUSCULOSKELETAL:  No edema; No deformity  SKIN: Warm and dry NEUROLOGIC:  Alert and oriented x 3 PSYCHIATRIC:  Normal affect     Signed, Shirlee More, MD  08/01/2020 2:01 PM    [facility]

## 2020-08-01 NOTE — Patient Instructions (Signed)
Medication Instructions:  Your physician recommends that you continue on your current medications as directed. Please refer to the Current Medication list given to you today.  *If you need a refill on your cardiac medications before your next appointment, please call your pharmacy*   Lab Work: None If you have labs (blood work) drawn today and your tests are completely normal, you will receive your results only by: MyChart Message (if you have MyChart) OR A paper copy in the mail If you have any lab test that is abnormal or we need to change your treatment, we will call you to review the results.   Testing/Procedures: Your physician has requested that you have an echocardiogram. Echocardiography is a painless test that uses sound waves to create images of your heart. It provides your doctor with information about the size and shape of your heart and how well your heart's chambers and valves are working. This procedure takes approximately one hour. There are no restrictions for this procedure.    Follow-Up: At [facility], you and your health needs are our priority.  As part of our continuing mission to provide you with exceptional heart care, we have created designated Provider Care Teams.  These Care Teams include your primary Cardiologist (physician) and Advanced Practice Providers (APPs -  Physician Assistants and Nurse Practitioners) who all work together to provide you with the care you need, when you need it.  We recommend signing up for the patient portal called "MyChart".  Sign up information is provided on this After Visit Summary.  MyChart is used to connect with patients for Virtual Visits (Telemedicine).  Patients are able to view lab/test results, encounter notes, upcoming appointments, etc.  Non-urgent messages can be sent to your provider as well.   To learn more about what you can do with MyChart, go to [email or web address].    Your next appointment:   6 week(s)  The  format for your next appointment:   In Person  Provider:   Brian Munley, MD   Other Instructions   

## 2020-08-02 ENCOUNTER — Telehealth: Payer: Self-pay

## 2020-08-02 ENCOUNTER — Inpatient Hospital Stay: Payer: PPO

## 2020-08-02 NOTE — Telephone Encounter (Signed)
Attempted to contact patient to schedule a Palliative Care consult appointment. No answer left a message to return call.  

## 2020-08-05 ENCOUNTER — Telehealth: Payer: Self-pay

## 2020-08-05 ENCOUNTER — Telehealth: Payer: Self-pay | Admitting: *Deleted

## 2020-08-05 NOTE — Telephone Encounter (Signed)
Attempted to contact patient and patient's daughter (EC in EPIC) to schedule a Palliative Care consult appointment. No answer left a message to return call.

## 2020-08-05 NOTE — Telephone Encounter (Signed)
Dr.Kale requested Foundation 1 test from pathology test on specimen dated 05/03/20 . Contacted pathology to request test on specimen dated 05/03/20, accession # [id number].   Per Suanne Marker in pathology, test will be sent out today - 08/05/20 with results anticipated in 2 weeks

## 2020-08-06 ENCOUNTER — Emergency Department (HOSPITAL_COMMUNITY): Payer: PPO

## 2020-08-06 ENCOUNTER — Other Ambulatory Visit: Payer: Self-pay

## 2020-08-06 ENCOUNTER — Encounter (HOSPITAL_COMMUNITY): Payer: Self-pay

## 2020-08-06 ENCOUNTER — Inpatient Hospital Stay (HOSPITAL_COMMUNITY)
Admission: EM | Admit: 2020-08-06 | Discharge: 2020-08-14 | DRG: 435 | Disposition: A | Discharge status: Hospice | Payer: PPO | Attending: Family Medicine | Admitting: Family Medicine

## 2020-08-06 ENCOUNTER — Other Ambulatory Visit (HOSPITAL_COMMUNITY): Payer: Self-pay

## 2020-08-06 DIAGNOSIS — I700 Atherosclerosis of aorta: Secondary | ICD-10-CM | POA: Diagnosis present

## 2020-08-06 DIAGNOSIS — E860 Dehydration: Secondary | ICD-10-CM | POA: Diagnosis not present

## 2020-08-06 DIAGNOSIS — J439 Emphysema, unspecified: Secondary | ICD-10-CM | POA: Diagnosis present

## 2020-08-06 DIAGNOSIS — R52 Pain, unspecified: Secondary | ICD-10-CM | POA: Diagnosis not present

## 2020-08-06 DIAGNOSIS — M2550 Pain in unspecified joint: Secondary | ICD-10-CM | POA: Diagnosis not present

## 2020-08-06 DIAGNOSIS — R1084 Generalized abdominal pain: Secondary | ICD-10-CM

## 2020-08-06 DIAGNOSIS — Z515 Encounter for palliative care: Secondary | ICD-10-CM

## 2020-08-06 DIAGNOSIS — E875 Hyperkalemia: Secondary | ICD-10-CM | POA: Diagnosis not present

## 2020-08-06 DIAGNOSIS — R109 Unspecified abdominal pain: Secondary | ICD-10-CM

## 2020-08-06 DIAGNOSIS — C3490 Malignant neoplasm of unspecified part of unspecified bronchus or lung: Secondary | ICD-10-CM | POA: Diagnosis not present

## 2020-08-06 DIAGNOSIS — D630 Anemia in neoplastic disease: Secondary | ICD-10-CM | POA: Diagnosis not present

## 2020-08-06 DIAGNOSIS — C787 Secondary malignant neoplasm of liver and intrahepatic bile duct: Secondary | ICD-10-CM | POA: Diagnosis not present

## 2020-08-06 DIAGNOSIS — I10 Essential (primary) hypertension: Secondary | ICD-10-CM | POA: Diagnosis present

## 2020-08-06 DIAGNOSIS — Z87891 Personal history of nicotine dependence: Secondary | ICD-10-CM

## 2020-08-06 DIAGNOSIS — N200 Calculus of kidney: Secondary | ICD-10-CM | POA: Diagnosis not present

## 2020-08-06 DIAGNOSIS — Z20822 Contact with and (suspected) exposure to covid-19: Secondary | ICD-10-CM | POA: Diagnosis present

## 2020-08-06 DIAGNOSIS — E785 Hyperlipidemia, unspecified: Secondary | ICD-10-CM | POA: Diagnosis present

## 2020-08-06 DIAGNOSIS — E872 Acidosis: Secondary | ICD-10-CM | POA: Diagnosis not present

## 2020-08-06 DIAGNOSIS — G893 Neoplasm related pain (acute) (chronic): Secondary | ICD-10-CM | POA: Diagnosis present

## 2020-08-06 DIAGNOSIS — K5900 Constipation, unspecified: Secondary | ICD-10-CM | POA: Diagnosis not present

## 2020-08-06 DIAGNOSIS — R0600 Dyspnea, unspecified: Secondary | ICD-10-CM

## 2020-08-06 DIAGNOSIS — K828 Other specified diseases of gallbladder: Secondary | ICD-10-CM | POA: Diagnosis not present

## 2020-08-06 DIAGNOSIS — N1831 Chronic kidney disease, stage 3a: Secondary | ICD-10-CM | POA: Diagnosis present

## 2020-08-06 DIAGNOSIS — R062 Wheezing: Secondary | ICD-10-CM | POA: Diagnosis not present

## 2020-08-06 DIAGNOSIS — I420 Dilated cardiomyopathy: Secondary | ICD-10-CM | POA: Diagnosis not present

## 2020-08-06 DIAGNOSIS — Z8249 Family history of ischemic heart disease and other diseases of the circulatory system: Secondary | ICD-10-CM

## 2020-08-06 DIAGNOSIS — Z66 Do not resuscitate: Secondary | ICD-10-CM | POA: Diagnosis not present

## 2020-08-06 DIAGNOSIS — N179 Acute kidney failure, unspecified: Secondary | ICD-10-CM | POA: Diagnosis not present

## 2020-08-06 DIAGNOSIS — R000 Tachycardia, unspecified: Secondary | ICD-10-CM | POA: Diagnosis not present

## 2020-08-06 DIAGNOSIS — Z7189 Other specified counseling: Secondary | ICD-10-CM | POA: Diagnosis not present

## 2020-08-06 DIAGNOSIS — E46 Unspecified protein-calorie malnutrition: Secondary | ICD-10-CM | POA: Diagnosis present

## 2020-08-06 DIAGNOSIS — C3411 Malignant neoplasm of upper lobe, right bronchus or lung: Secondary | ICD-10-CM | POA: Diagnosis not present

## 2020-08-06 DIAGNOSIS — E871 Hypo-osmolality and hyponatremia: Secondary | ICD-10-CM | POA: Diagnosis present

## 2020-08-06 DIAGNOSIS — K7689 Other specified diseases of liver: Secondary | ICD-10-CM | POA: Diagnosis not present

## 2020-08-06 DIAGNOSIS — Z85118 Personal history of other malignant neoplasm of bronchus and lung: Secondary | ICD-10-CM | POA: Diagnosis not present

## 2020-08-06 DIAGNOSIS — R6520 Severe sepsis without septic shock: Secondary | ICD-10-CM

## 2020-08-06 DIAGNOSIS — R0609 Other forms of dyspnea: Secondary | ICD-10-CM

## 2020-08-06 DIAGNOSIS — N390 Urinary tract infection, site not specified: Secondary | ICD-10-CM | POA: Diagnosis not present

## 2020-08-06 DIAGNOSIS — N2889 Other specified disorders of kidney and ureter: Secondary | ICD-10-CM | POA: Diagnosis not present

## 2020-08-06 DIAGNOSIS — G929 Unspecified toxic encephalopathy: Secondary | ICD-10-CM | POA: Diagnosis not present

## 2020-08-06 DIAGNOSIS — A419 Sepsis, unspecified organism: Secondary | ICD-10-CM

## 2020-08-06 DIAGNOSIS — Z7951 Long term (current) use of inhaled steroids: Secondary | ICD-10-CM

## 2020-08-06 DIAGNOSIS — Z7401 Bed confinement status: Secondary | ICD-10-CM | POA: Diagnosis not present

## 2020-08-06 DIAGNOSIS — C3412 Malignant neoplasm of upper lobe, left bronchus or lung: Secondary | ICD-10-CM | POA: Diagnosis present

## 2020-08-06 DIAGNOSIS — R188 Other ascites: Secondary | ICD-10-CM | POA: Diagnosis present

## 2020-08-06 DIAGNOSIS — R1013 Epigastric pain: Secondary | ICD-10-CM | POA: Diagnosis not present

## 2020-08-06 DIAGNOSIS — N189 Chronic kidney disease, unspecified: Secondary | ICD-10-CM | POA: Diagnosis not present

## 2020-08-06 DIAGNOSIS — M47816 Spondylosis without myelopathy or radiculopathy, lumbar region: Secondary | ICD-10-CM | POA: Diagnosis not present

## 2020-08-06 DIAGNOSIS — Z79899 Other long term (current) drug therapy: Secondary | ICD-10-CM

## 2020-08-06 DIAGNOSIS — N183 Chronic kidney disease, stage 3 unspecified: Secondary | ICD-10-CM

## 2020-08-06 DIAGNOSIS — G9341 Metabolic encephalopathy: Secondary | ICD-10-CM | POA: Diagnosis not present

## 2020-08-06 DIAGNOSIS — K219 Gastro-esophageal reflux disease without esophagitis: Secondary | ICD-10-CM | POA: Diagnosis present

## 2020-08-06 DIAGNOSIS — R0602 Shortness of breath: Secondary | ICD-10-CM | POA: Diagnosis not present

## 2020-08-06 DIAGNOSIS — R0902 Hypoxemia: Secondary | ICD-10-CM | POA: Diagnosis not present

## 2020-08-06 LAB — URINALYSIS, ROUTINE W REFLEX MICROSCOPIC
Bilirubin Urine: NEGATIVE
Glucose, UA: NEGATIVE mg/dL
Hgb urine dipstick: NEGATIVE
Ketones, ur: 5 mg/dL — AB
Nitrite: NEGATIVE
Protein, ur: NEGATIVE mg/dL
Specific Gravity, Urine: 1.018 (ref 1.005–1.030)
pH: 5 (ref 5.0–8.0)

## 2020-08-06 LAB — COMPREHENSIVE METABOLIC PANEL
ALT: 46 U/L — ABNORMAL HIGH (ref 0–44)
AST: 93 U/L — ABNORMAL HIGH (ref 15–41)
Albumin: 3.4 g/dL — ABNORMAL LOW (ref 3.5–5.0)
Alkaline Phosphatase: 656 U/L — ABNORMAL HIGH (ref 38–126)
Anion gap: 20 — ABNORMAL HIGH (ref 5–15)
BUN: 41 mg/dL — ABNORMAL HIGH (ref 8–23)
CO2: 20 mmol/L — ABNORMAL LOW (ref 22–32)
Calcium: 9.5 mg/dL (ref 8.9–10.3)
Chloride: 93 mmol/L — ABNORMAL LOW (ref 98–111)
Creatinine, Ser: 2.44 mg/dL — ABNORMAL HIGH (ref 0.44–1.00)
GFR, Estimated: 20 mL/min — ABNORMAL LOW (ref >60)
Glucose, Bld: 85 mg/dL (ref 70–99)
Potassium: 5.1 mmol/L (ref 3.5–5.1)
Sodium: 133 mmol/L — ABNORMAL LOW (ref 135–145)
Total Bilirubin: 2.2 mg/dL — ABNORMAL HIGH (ref 0.3–1.2)
Total Protein: 7.6 g/dL (ref 6.5–8.1)

## 2020-08-06 LAB — CBC WITH DIFFERENTIAL/PLATELET
Abs Immature Granulocytes: 0.09 10*3/uL — ABNORMAL HIGH (ref 0.00–0.07)
Basophils Absolute: 0 10*3/uL (ref 0.0–0.1)
Basophils Relative: 0 %
Eosinophils Absolute: 0 10*3/uL (ref 0.0–0.5)
Eosinophils Relative: 0 %
HCT: 45.3 % (ref 36.0–46.0)
Hemoglobin: 13.9 g/dL (ref 12.0–15.0)
Immature Granulocytes: 1 %
Lymphocytes Relative: 5 %
Lymphs Abs: 0.7 10*3/uL (ref 0.7–4.0)
MCH: 27.6 pg (ref 26.0–34.0)
MCHC: 30.7 g/dL (ref 30.0–36.0)
MCV: 90.1 fL (ref 80.0–100.0)
Monocytes Absolute: 0.7 10*3/uL (ref 0.1–1.0)
Monocytes Relative: 5 %
Neutro Abs: 12.4 10*3/uL — ABNORMAL HIGH (ref 1.7–7.7)
Neutrophils Relative %: 89 %
Platelets: 439 10*3/uL — ABNORMAL HIGH (ref 150–400)
RBC: 5.03 MIL/uL (ref 3.87–5.11)
RDW: 17.8 % — ABNORMAL HIGH (ref 11.5–15.5)
WBC: 13.9 10*3/uL — ABNORMAL HIGH (ref 4.0–10.5)
nRBC: 0 % (ref 0.0–0.2)

## 2020-08-06 LAB — RESP PANEL BY RT-PCR (FLU A&B, COVID) ARPGX2
Influenza A by PCR: NEGATIVE
Influenza B by PCR: NEGATIVE
SARS Coronavirus 2 by RT PCR: NEGATIVE

## 2020-08-06 LAB — LACTIC ACID, PLASMA
Lactic Acid, Venous: 2.4 mmol/L (ref 0.5–1.9)
Lactic Acid, Venous: 2.2 mmol/L (ref 0.5–1.9)

## 2020-08-06 LAB — LIPASE, BLOOD: Lipase: 33 U/L (ref 11–51)

## 2020-08-06 MED ORDER — ONDANSETRON HCL 4 MG PO TABS
4.0000 mg | ORAL_TABLET | Freq: Four times a day (QID) | ORAL | Status: DC | PRN
  Administered 2020-08-07 – 2020-08-12 (×6): 4 mg via ORAL
  Filled 2020-08-06 (×7): qty 1

## 2020-08-06 MED ORDER — SODIUM CHLORIDE 0.9 % IV SOLN
2.0000 g | INTRAVENOUS | Status: DC
  Filled 2020-08-06: qty 20

## 2020-08-06 MED ORDER — LORAZEPAM 0.5 MG PO TABS
0.5000 mg | ORAL_TABLET | Freq: Four times a day (QID) | ORAL | Status: DC | PRN
  Administered 2020-08-10 – 2020-08-11 (×2): 0.5 mg via ORAL
  Filled 2020-08-06 (×2): qty 1

## 2020-08-06 MED ORDER — LACTATED RINGERS IV BOLUS
500.0000 mL | Freq: Once | INTRAVENOUS | Status: AC
  Administered 2020-08-06: 500 mL via INTRAVENOUS

## 2020-08-06 MED ORDER — SODIUM CHLORIDE 0.9% FLUSH
3.0000 mL | Freq: Two times a day (BID) | INTRAVENOUS | Status: DC
  Administered 2020-08-07 – 2020-08-13 (×10): 3 mL via INTRAVENOUS

## 2020-08-06 MED ORDER — OXYCODONE HCL 5 MG PO TABS
5.0000 mg | ORAL_TABLET | ORAL | Status: DC | PRN
  Administered 2020-08-06 – 2020-08-08 (×5): 5 mg via ORAL
  Administered 2020-08-08 – 2020-08-11 (×3): 10 mg via ORAL
  Administered 2020-08-11 (×2): 5 mg via ORAL
  Filled 2020-08-06 (×3): qty 1
  Filled 2020-08-06: qty 2
  Filled 2020-08-06: qty 1
  Filled 2020-08-06 (×2): qty 2
  Filled 2020-08-06 (×4): qty 1
  Filled 2020-08-06: qty 2

## 2020-08-06 MED ORDER — MORPHINE SULFATE (PF) 4 MG/ML IV SOLN
4.0000 mg | Freq: Once | INTRAVENOUS | Status: AC
  Administered 2020-08-06: 4 mg via INTRAVENOUS
  Filled 2020-08-06: qty 1

## 2020-08-06 MED ORDER — ALBUTEROL SULFATE HFA 108 (90 BASE) MCG/ACT IN AERS
2.0000 | INHALATION_SPRAY | RESPIRATORY_TRACT | Status: DC | PRN
  Administered 2020-08-06 – 2020-08-08 (×4): 2 via RESPIRATORY_TRACT
  Filled 2020-08-06: qty 6.7

## 2020-08-06 MED ORDER — HEPARIN SODIUM (PORCINE) 5000 UNIT/ML IJ SOLN
5000.0000 [IU] | Freq: Three times a day (TID) | INTRAMUSCULAR | Status: DC
  Administered 2020-08-06 – 2020-08-14 (×23): 5000 [IU] via SUBCUTANEOUS
  Filled 2020-08-06 (×24): qty 1

## 2020-08-06 MED ORDER — SALINE SPRAY 0.65 % NA SOLN: 1.0000 | Freq: Every day | NASAL | Status: DC | PRN

## 2020-08-06 MED ORDER — TRAMADOL HCL 50 MG PO TABS
50.0000 mg | ORAL_TABLET | Freq: Two times a day (BID) | ORAL | Status: DC | PRN
  Administered 2020-08-10: 50 mg via ORAL
  Filled 2020-08-06: qty 1

## 2020-08-06 MED ORDER — LACTATED RINGERS IV SOLN: INTRAVENOUS | Status: DC

## 2020-08-06 MED ORDER — SODIUM CHLORIDE 0.9 % IV BOLUS
1000.0000 mL | Freq: Once | INTRAVENOUS | Status: AC
  Administered 2020-08-06: 1000 mL via INTRAVENOUS

## 2020-08-06 MED ORDER — ONDANSETRON HCL 4 MG/2ML IJ SOLN
4.0000 mg | Freq: Four times a day (QID) | INTRAMUSCULAR | Status: DC | PRN
  Administered 2020-08-09 – 2020-08-14 (×4): 4 mg via INTRAVENOUS
  Filled 2020-08-06 (×4): qty 2

## 2020-08-06 MED ORDER — METRONIDAZOLE IN NACL 5-0.79 MG/ML-% IV SOLN
500.0000 mg | Freq: Once | INTRAVENOUS | Status: AC
  Administered 2020-08-06: 500 mg via INTRAVENOUS
  Filled 2020-08-06: qty 100

## 2020-08-06 MED ORDER — PROCHLORPERAZINE MALEATE 10 MG PO TABS: 10.0000 mg | ORAL_TABLET | Freq: Four times a day (QID) | ORAL | Status: DC | PRN

## 2020-08-06 MED ORDER — HYDROMORPHONE HCL 1 MG/ML IJ SOLN
0.5000 mg | INTRAMUSCULAR | Status: DC | PRN
  Administered 2020-08-06: 1 mg via INTRAVENOUS
  Administered 2020-08-07: 0.5 mg via INTRAVENOUS
  Administered 2020-08-07: 1 mg via INTRAVENOUS
  Filled 2020-08-06 (×3): qty 1

## 2020-08-06 MED ORDER — METOPROLOL SUCCINATE ER 50 MG PO TB24
50.0000 mg | ORAL_TABLET | Freq: Every day | ORAL | Status: DC
  Administered 2020-08-06 – 2020-08-14 (×9): 50 mg via ORAL
  Filled 2020-08-06 (×9): qty 1

## 2020-08-06 MED ORDER — POLYETHYLENE GLYCOL 3350 17 G PO PACK: 17.0000 g | PACK | Freq: Every day | ORAL | Status: DC | PRN

## 2020-08-06 MED ORDER — SODIUM CHLORIDE 0.9 % IV SOLN
2.0000 g | Freq: Once | INTRAVENOUS | Status: AC
  Administered 2020-08-06: 2 g via INTRAVENOUS
  Filled 2020-08-06: qty 20

## 2020-08-06 MED ORDER — LACTATED RINGERS IV BOLUS (SEPSIS)
250.0000 mL | Freq: Once | INTRAVENOUS | Status: AC
  Administered 2020-08-06: 250 mL via INTRAVENOUS

## 2020-08-06 MED ORDER — LACTATED RINGERS IV BOLUS (SEPSIS)
500.0000 mL | Freq: Once | INTRAVENOUS | Status: AC
  Administered 2020-08-06: 500 mL via INTRAVENOUS

## 2020-08-06 MED ORDER — BUDESONIDE 0.5 MG/2ML IN SUSP
0.5000 mg | Freq: Two times a day (BID) | RESPIRATORY_TRACT | Status: DC
  Administered 2020-08-06 – 2020-08-13 (×13): 0.5 mg via RESPIRATORY_TRACT
  Filled 2020-08-06 (×17): qty 2

## 2020-08-06 NOTE — ED Notes (Addendum)
Patient assisted to bed side commode and then back into bed.

## 2020-08-06 NOTE — Care Plan (Addendum)
Advance Care Planning   Purpose of Encounter Discuss current and chronic illness and patient's goals for care.   Parties in Attendance Patient  Discussion:  Discussed in detail patient's presentation, as well as current diagnoses and recommended treatment.  Recovery of acute issues is anticipated with treatment as outlined elsewhere. Chronic issues will follow with oncology and cardiology.  Patient was in agreement with this.   Discussed CODE STATUS.    Patient desires to be DNR/DNI.   CODE STATUS entered as DNR/DNI.  Murray Hodgkins, MD [facility]

## 2020-08-06 NOTE — ED Notes (Signed)
Dr. Alvino Chapel aware patients lactic acid is 2.4

## 2020-08-06 NOTE — ED Notes (Signed)
Per Alfredia Client, PA one set of cultures is acceptable, second set not needed.

## 2020-08-06 NOTE — H&P (Signed)
History and Physical  Chelsea Baker FGH:[id number] DOB: February 10, 1946 DOA: 08/06/2020  PCP: Mackie Pai, PA-C   Chief Complaint: abd pain  HPI:  75yom PMH lung cancer w/ metastatic disease to the liver, last chemo ~2 weeks ago presented with 3 days of abdominal pain.  Admitted with concern for sepsis, further evaluation of abdominal pain.  Patient takes tramadol at home which helps somewhat but reports that she developed a new left-sided abdominal pain over the last 3 days, 9/10, stabbing, no specific aggravating or alleviating factors.  Does not think it is related to her cancer.  Nausea but no vomiting.  No dysuria or urinary symptoms.  ED Course: treated w/ abx and fluids 1750 mL  Review of Systems:  No fever, sore throat, rash, new muscle aches, chest pain, new SOB, dysuria, bleeding, vomiting Positive for visual changes w/ chemo, nausea Normal BM 2 days ago  PMH . Non-small cell lung cancer with metastatic disease to the liver currently on chemotherapy . Dilated cardiomyopathy . Hypertension . Hyperlipidemia . Remainder reviewed in epic  [place] . Lung biopsy . Remainder reviewed in Epic  Family history includes: . Mother with heart failure, heart attack, coronary artery disease . Remainder reviewed in [place] History . Rare alcohol . Former smoker  Allergies . None  Meds include: Current Outpatient Medications  Medication Instructions  . acetaminophen (TYLENOL) 500 mg, Oral, Every 6 hours PRN  . acyclovir (ZOVIRAX) 400 mg, Oral, 2 times daily  . albuterol (VENTOLIN HFA) 108 (90 Base) MCG/ACT inhaler 2 puffs, Inhalation, Every 4 hours PRN  . Baclofen 5 MG TABS 1 tablet, Oral, 2 times daily PRN  . budesonide (PULMICORT) 0.5 mg, Nebulization, 2 times daily  . dexamethasone (DECADRON) 4 mg, Oral, 2 times daily with breakfast and lunch, Start the day after carboplatin chemotherapy for 3 days.  . fexofenadine (ALLEGRA) 180 mg, Oral, Daily  .  ipratropium-albuterol (DUONEB) 0.5-2.5 (3) MG/3ML SOLN 3 mLs, Nebulization, Every 4 hours PRN  . LORazepam (ATIVAN) 0.5 mg, Oral, Every 6 hours PRN  . metoprolol succinate (TOPROL-XL) 50 MG 24 hr tablet 1 tab po q day  . ondansetron (ZOFRAN) 8 mg, Oral, 2 times daily PRN, Start on day 3 after carboplatin chemo.  . prochlorperazine (COMPAZINE) 10 mg, Oral, Every 6 hours PRN  . sodium chloride (OCEAN) 0.65 % SOLN nasal spray 1 spray, Each Nare, Daily PRN  . Tetrahydrozoline HCl (VISINE EXTRA OP) 1 drop, Both Eyes, Daily PRN  . traMADol (ULTRAM) 50 mg, Oral, Every 6 hours PRN    Physicial Exam   Vitals:  Vitals:   08/06/20 1900 08/06/20 1920  BP: 115/81 122/72  Pulse: (!) 108 (!) 110  Resp: (!) 32 17  Temp:    SpO2: 95% 97%    Constitutional:   . Appears calm and mildly uncomfortable Eyes:  . pupils and irises appear normal . Normal lids  ENMT:  . grossly normal hearing  . Lips appear normal . Tongue appears normal Neck:  . no masses . no thyromegaly Respiratory:  . CTA bilaterally, no w/r/r.  . Respiratory effort normal. Mild tachypnea and dyspnea Cardiovascular:  . RRR, no m/r/g . No LE extremity edema   Abdomen:  . Somewhat firm, no rebound, no guarding . Diffusely tender upper abd, mild suprapubic pain . No hernias noted Musculoskeletal:  . Digits/nails BUE: no clubbing, cyanosis, petechiae, infection . RUE, LUE, RLE, LLE   o strength and tone grossly normal, no atrophy, no abnormal movements  Skin:  . No rashes, lesions, ulcers . palpation of skin: no induration or nodules Psychiatric:  . Mental status o Mood, affect appropriate . judgment and insight appear intact   I have personally reviewed following labs and imaging studies  Labs:  Marland Kitchen Creatinine up to 2.44.  Alkaline phosphatase 656, AST and ALT elevated 93 and 46.  Total bilirubin 2.2. . Lactic acid 2.4, 2.2 . WBC 13.9 . Urinalysis equivocal  Imaging studies:   Chest x-ray independently reviewed,  no acute disease  CT abdomen and pelvis stable hepatic enlargement and metastatic disease.  New mild ascites not significant to allow for safe paracentesis.  Medical tests:   EKG independently reviewed: Sinus tachycardia, no acute changes  ASSESSMENT/PLAN  75 year old woman PMH metastatic lung cancer presented with acute abdominal pain, admitted for possible sepsis, possible UTI.  Possible sepsis, possible UTI --Meets SIRS criteria lactic acid elevated, however she has no urinary symptoms but does have some suprapubic pain.  Urinalysis is equivocal.  She does not appear septic and hemodynamics are stable.  Imaging unrevealing except for mild ascites which is new. --I suppose SBP is in the differential here, however very mild ascites on CT not enough for safe paracentesis.   --Empiric antibiotics and follow clinical course.  Follow-up culture data.  Lactic acid trending down. --Low threshold to discontinue antibiotics if improves rapidly --IV fluids  Acute kidney injury superimposed on CKD stage IIIa --Secondary to sepsis or dehydration --IV fluids.  BMP in a.m.  Generalized abdominal pain --CT without acute abnormalities.  She is fairly tender on examination. --Suspect secondary to metastatic disease --Pain control, follow abdominal exam  Metastatic lung cancer to liver with last chemotherapy approximately 2 weeks ago --Supportive care.  Follow-up with Dr. [name] as an outpatient.  Dr. [name] notified of admission.  Elevated total bilirubin which is new and elevated alkaline phosphatase which is old.  Mild transaminitis --Most likely secondary to known metastatic disease to the liver.  Repeat CMP in a.m.  Dilated cardiomyopathy, LVEF 47-42%, grade 1 diastolic dysfunction --Appears dry.  IV fluids.  Watch volume status.  Strict I/O  DVT prophylaxis: heparin Code Status: DNR Family Communication: none Consults called: none    Time spent: 60 minutes  Murray Hodgkins, MD  [facility] Direct contact: see [email or web address]  7PM-7AM contact night coverage as below   1. Check the care team in [facility] and look for a) attending/consulting [facility] provider listed and b) the [facility] team listed 2. Log into [email or web address] and use [facility]'s universal password to access. If you do not have the password, please contact the hospital operator. 3. Locate the [facility] provider you are looking for under [facility] and page to a number that you can be directly reached. 4. If you still have difficulty reaching the provider, please page the [facility] (Director on Call) for the Hospitalists listed on amion for assistance.  Severity of Illness: The appropriate patient status for this patient is INPATIENT. Inpatient status is judged to be reasonable and necessary in order to provide the required intensity of service to ensure the patient's safety. The patient's presenting symptoms, physical exam findings, and initial radiographic and laboratory data in the context of their chronic comorbidities is felt to place them at high risk for further clinical deterioration. Furthermore, it is not anticipated that the patient will be medically stable for discharge from the hospital within 2 midnights of admission. The following factors support the patient status of inpatient.   " The patient's  presenting symptoms include abd pain. " The worrisome physical exam findings include abd pain. " The initial radiographic and laboratory data are worrisome because of new ascites, abnormal urinalysis, acute kidney injury, leukocytosis, lactic acidosis. " The chronic co-morbidities include lung cancer with metastatic disease to the liver, dilated cardiomyopathy.   * I certify that at the point of admission it is my clinical judgment that the patient will require inpatient hospital care spanning beyond 2 midnights from the point of admission due to high intensity of service, high risk for further deterioration and high  frequency of surveillance required.*   Status is: Inpatient  Remains inpatient appropriate because:IV treatments appropriate due to intensity of illness or inability to take PO and Inpatient level of care appropriate due to severity of illness   Dispo: The patient is from: Home              Anticipated d/c is to: Home              Anticipated d/c date is: 2 days              Patient currently is not medically stable to d/c.   Difficult to place patient No  08/06/2020, 7:28 PM   Principal Problem:   Sepsis ([place]) Active Problems:   Dilated cardiomyopathy ([place])   Non-small cell lung cancer metastatic to liver ([facility])   Acute lower UTI   Abdominal pain   AKI (acute kidney injury) ([place])   CKD (chronic kidney disease), stage III ([place])

## 2020-08-06 NOTE — Sepsis Progress Note (Signed)
elink is monitoring this code sepsis.

## 2020-08-06 NOTE — ED Provider Notes (Signed)
[facility] DEPT Provider Note   CSN: [id number] Arrival date & time: 08/06/20  1212     History Chief Complaint  Patient presents with  . Abdominal Pain    Chelsea Baker is a 75 y.o. female with pertinent past medical history of non-small cell squamous lung cancer diagnosed in 2019 with liver metastasis in 2021 followed by Dr. [name], COPD, tachycardia, dilated cardiomyopathy with LVEF 35 to 40%, hypertension, hyperlipidemia, GERD, AAA that presents the emergency department today for abdominal pain.  Patient states that she started having abdominal pain for the past 3 days, has been worsening.  Patient states that it is in the suprapubic area and has been radiating throughout her abdomen.  Does not radiate to her back, chest.  No shortness of breath.  Patient states that it is not worse in the right upper quadrant.  States that it is tender throughout.  Denies any fevers or chills.  States that she does feel slightly nauseous, however not nauseous currently.  Patient states that she has been going to chemo, last infusion 2 weeks ago, has been doing this since November.  Per chart review patient did have CT done 2 weeks ago which revealed progression of widespread bulky liver metastatic disease with new hepatomegaly.  During last oncology visit, she was reporting intermittent nausea which is what she is reporting now.  Denies any cough or URI symptoms.  Patient states that she has had no abdominal surgeries.  Denies any dysuria or hematuria.  States that she has not had a bowel movement since Friday, states that she has not been passing gas since Friday either.  No other complaints at this time.   HPI     Past Medical History:  Diagnosis Date  . AAA (abdominal aortic aneurysm) ([place])   . AAA (abdominal aortic aneurysm) without rupture ([place]) 04/10/2014  . Abnormal echocardiogram 08/10/2017  . Aftercare following surgery of the circulatory system 04/10/2014  .  Aftercare following surgery of the circulatory system, [place] 11/15/2012  . Cavitating mass in right upper lung lobe 07/12/2017  . Chest discomfort 08/10/2017  . Counseling regarding advance care planning and goals of care 05/17/2020  . Dilated cardiomyopathy ([place]) 07/17/2017   Echocardiogram July 17, 2017: EF 35-40%.  Septal mid and basal inferior wall hypokinesis.  Mildly dilated LV.  Mildly reduced EF.  GR 1 DD.  Calcified mitral annulus.    . Diverticulosis   . GERD (gastroesophageal reflux disease)   . Healthcare maintenance 12/21/2017  . Hiatal hernia   . Hyperlipidemia 12/21/2017  . Hypertension   . Hypokalemia   . Hyponatremia 07/12/2017  . Hypoxia   . Lumbar spondylolysis   . Lung cancer ([place]) dx'd 2019  . Lung tumor   . Malignant neoplasm of upper lobe of left lung ([place]) 03/22/2018  . Malignant neoplasm of upper lobe of right lung ([place]) 07/12/2017  . Non-small cell lung cancer metastatic to liver ([place]) 05/17/2020  . On home O2    2L N/C  . Protein-calorie malnutrition ([place])   . Shortness of breath   . Tachycardia 07/12/2017    Patient Active Problem List   Diagnosis Date Noted  . Sepsis ([place]) 08/06/2020  . On home O2   . Lung tumor   . Lung cancer ([place])   . Lumbar spondylolysis   . Hypokalemia   . Hiatal hernia   . GERD (gastroesophageal reflux disease)   . Diverticulosis   . AAA (abdominal aortic aneurysm) ([place])   .  Non-small cell lung cancer metastatic to liver ([place]) 05/17/2020  . Counseling regarding advance care planning and goals of care 05/17/2020  . Malignant neoplasm of upper lobe of left lung ([place]) 03/22/2018  . Healthcare maintenance 12/21/2017  . Hyperlipidemia 12/21/2017  . Hypertension 08/25/2017  . Chest discomfort 08/10/2017  . Abnormal echocardiogram 08/10/2017  . Hypoxia   . Dilated cardiomyopathy ([place]) 07/17/2017  . Shortness of breath   . Protein-calorie malnutrition ([place])   . Malignant neoplasm of upper lobe of right lung ([place]) 07/12/2017  .  Tachycardia 07/12/2017  . Hyponatremia 07/12/2017  . Cavitating mass in right upper lung lobe 07/12/2017  . AAA (abdominal aortic aneurysm) without rupture ([place]) 04/10/2014  . Aftercare following surgery of the circulatory system 04/10/2014  . Aftercare following surgery of the circulatory system, [place] 11/15/2012    Past Surgical History:  Procedure Laterality Date  . ABDOMINAL AORTIC ANEURYSM REPAIR  10/01/10   endostent repair  . CATARACT EXTRACTION, BILATERAL    . LUNG BIOPSY    . NM MYOVIEW LTD  2012   Normal Lexiscan Myoview with no ischemia or infarction.  Normal LV function.  EF 67%.  . TONSILLECTOMY    . TRANSTHORACIC ECHOCARDIOGRAM  07/17/2017   EF 35-40%.  Septal mid and basal inferior wall hypokinesis.  Mildly dilated LV.  Mildly reduced EF.  GR 1 DD.  Calcified mitral annulus.      OB History   No obstetric history on file.     Family History  Problem Relation Age of Onset  . Heart failure Mother   . Heart attack Mother 46       In 68s - 1st MI  . Coronary artery disease Mother   . Coronary artery disease Father        Died at age 62  . Heart attack Father 68  . Heart disease Maternal Grandmother   . Heart disease Maternal Grandfather   . Cancer Neg Hx     Social History   Tobacco Use  . Smoking status: Former Smoker    Packs/day: 1.00    Years: 50.00    Pack years: 50.00    Types: Cigarettes    Quit date: 06/2017    Years since quitting: 3.1  . Smokeless tobacco: Never Used  Vaping Use  . Vaping Use: Never used  Substance Use Topics  . Alcohol use: Yes    Comment: rarely  . Drug use: No    Home Medications Prior to Admission medications   Medication Sig Start Date End Date Taking? Authorizing Provider  acetaminophen (TYLENOL) 500 MG tablet Take 500 mg by mouth every 6 (six) hours as needed for moderate pain.   Yes [provider]  albuterol (VENTOLIN HFA) 108 (90 Base) MCG/ACT inhaler INHALE 2 PUFFS INTO THE LUNGS EVERY 4 (FOUR) HOURS  AS NEEDED FOR WHEEZING OR SHORTNESS OF BREATH. 10/09/19  Yes Olalere, Adewale A, MD  Baclofen 5 MG TABS Take 1 tablet by mouth 2 (two) times daily as needed. Patient taking differently: Take 1 tablet by mouth 2 (two) times daily as needed (musle spasm). 01/29/20  Yes Abonza, Maritza, PA-C  budesonide (PULMICORT) 0.5 MG/2ML nebulizer solution Take 2 mLs (0.5 mg total) by nebulization 2 (two) times daily. Patient taking differently: Take 0.5 mg by nebulization daily as needed (wheezing). 01/12/19  Yes Olalere, Adewale A, MD  dexamethasone (DECADRON) 4 MG tablet Take 1 tablet (4 mg total) by mouth 2 (two) times daily with breakfast and lunch. Start  the day after carboplatin chemotherapy for 3 days. Patient taking differently: Take 4 mg by mouth daily as needed (inflammation). 07/29/20  Yes [name], MD  LORazepam (ATIVAN) 0.5 MG tablet Take 1 tablet (0.5 mg total) by mouth every 6 (six) hours as needed (Nausea or vomiting). Patient taking differently: Take 0.5 mg by mouth every 6 (six) hours as needed for anxiety. 05/17/20  Yes [name], MD  metoprolol succinate (TOPROL-XL) 50 MG 24 hr tablet 1 tab po q day Patient taking differently: Take 50 mg by mouth daily. 07/10/20  Yes Saguier, Percell Miller, PA-C  ondansetron (ZOFRAN) 8 MG tablet Take 1 tablet (8 mg total) by mouth 2 (two) times daily as needed for refractory nausea / vomiting. Start on day 3 after carboplatin chemo. Patient taking differently: Take 8 mg by mouth 2 (two) times daily as needed for refractory nausea / vomiting. 05/17/20  Yes [name], MD  prochlorperazine (COMPAZINE) 10 MG tablet Take 1 tablet (10 mg total) by mouth every 6 (six) hours as needed (Nausea or vomiting). Patient taking differently: Take 10 mg by mouth every 6 (six) hours as needed for refractory nausea / vomiting. 05/17/20  Yes [name], MD  sodium chloride (OCEAN) 0.65 % SOLN nasal spray Place 1 spray into both nostrils daily as  needed for congestion.   Yes [provider]  Tetrahydrozoline HCl (VISINE EXTRA OP) Place 1 drop into both eyes daily as needed (dry eyes).   Yes [provider]  traMADol (ULTRAM) 50 MG tablet Take 1 tablet (50 mg total) by mouth every 6 (six) hours as needed. Patient taking differently: Take 50 mg by mouth every 6 (six) hours as needed for moderate pain. 07/29/20  Yes [name], MD  acyclovir (ZOVIRAX) 400 MG tablet Take 1 tablet (400 mg total) by mouth 2 (two) times daily. Patient not taking: No sig reported 07/05/20   [name], MD  fexofenadine (ALLEGRA) 180 MG tablet Take 1 tablet (180 mg total) by mouth daily. Patient not taking: No sig reported 03/18/20   [name], MD  ipratropium-albuterol (DUONEB) 0.5-2.5 (3) MG/3ML SOLN Take 3 mLs by nebulization every 4 (four) hours as needed. Patient not taking: No sig reported 12/19/18   Laurin Coder, MD    Allergies    Patient has no known allergies.  Review of Systems   Review of Systems  Constitutional: Negative for chills, diaphoresis, fatigue and fever.  HENT: Negative for congestion, sore throat and trouble swallowing.   Eyes: Negative for pain and visual disturbance.  Respiratory: Negative for cough, shortness of breath and wheezing.   Cardiovascular: Negative for chest pain, palpitations and leg swelling.  Gastrointestinal: Positive for abdominal pain and nausea. Negative for abdominal distention, diarrhea and vomiting.  Genitourinary: Negative for difficulty urinating.  Musculoskeletal: Negative for back pain, neck pain and neck stiffness.  Skin: Negative for pallor.  Neurological: Negative for dizziness, speech difficulty, weakness and headaches.  Psychiatric/Behavioral: Negative for confusion.    Physical Exam Updated Vital Signs BP 115/66   Pulse (!) 103   Temp 99.1 F (37.3 C) (Rectal)   Resp 16   SpO2 94%   Physical Exam Constitutional:      General: She is in  acute distress.     Appearance: Normal appearance. She is not ill-appearing, toxic-appearing or diaphoretic.  HENT:     Mouth/Throat:     Mouth: Mucous membranes are moist.     Pharynx: Oropharynx is  clear.  Eyes:     General: No scleral icterus.    Extraocular Movements: Extraocular movements intact.     Pupils: Pupils are equal, round, and reactive to light.  Cardiovascular:     Rate and Rhythm: Regular rhythm. Tachycardia present.     Pulses: Normal pulses.     Heart sounds: Normal heart sounds.  Pulmonary:     Effort: Pulmonary effort is normal. No respiratory distress.     Breath sounds: Normal breath sounds. No stridor. No wheezing, rhonchi or rales.  Chest:     Chest wall: No tenderness.  Abdominal:     General: Abdomen is flat. There is no distension.     Palpations: Abdomen is soft.     Tenderness: There is generalized abdominal tenderness. There is no guarding or rebound.  Musculoskeletal:        General: No swelling or tenderness. Normal range of motion.     Cervical back: Normal range of motion and neck supple. No rigidity.     Right lower leg: No edema.     Left lower leg: No edema.  Skin:    General: Skin is warm and dry.     Capillary Refill: Capillary refill takes less than 2 seconds.     Coloration: Skin is not pale.  Neurological:     General: No focal deficit present.     Mental Status: She is alert and oriented to person, place, and time.  Psychiatric:        Mood and Affect: Mood normal.        Behavior: Behavior normal.     ED Results / Procedures / Treatments   Labs (all labs ordered are listed, but only abnormal results are displayed) Labs Reviewed  CBC WITH DIFFERENTIAL/PLATELET - Abnormal; Notable for the following components:      Result Value   WBC 13.9 (*)    RDW 17.8 (*)    Platelets 439 (*)    Neutro Abs 12.4 (*)    Abs Immature Granulocytes 0.09 (*)    All other components within normal limits  COMPREHENSIVE METABOLIC PANEL -  Abnormal; Notable for the following components:   Sodium 133 (*)    Chloride 93 (*)    CO2 20 (*)    BUN 41 (*)    Creatinine, Ser 2.44 (*)    Albumin 3.4 (*)    AST 93 (*)    ALT 46 (*)    Alkaline Phosphatase 656 (*)    Total Bilirubin 2.2 (*)    GFR, Estimated 20 (*)    Anion gap 20 (*)    All other components within normal limits  URINALYSIS, ROUTINE W REFLEX MICROSCOPIC - Abnormal; Notable for the following components:   APPearance HAZY (*)    Ketones, ur 5 (*)    Leukocytes,Ua SMALL (*)    Bacteria, UA FEW (*)    All other components within normal limits  LACTIC ACID, PLASMA - Abnormal; Notable for the following components:   Lactic Acid, Venous 2.4 (*)    All other components within normal limits  LACTIC ACID, PLASMA - Abnormal; Notable for the following components:   Lactic Acid, Venous 2.2 (*)    All other components within normal limits  CULTURE, BLOOD (ROUTINE X 2)  URINE CULTURE  CULTURE, BLOOD (ROUTINE X 2)  RESP PANEL BY RT-PCR (FLU A&B, COVID) ARPGX2  LIPASE, BLOOD    EKG EKG Interpretation  Date/Time:  Tuesday August 06 2020 12:47:00 EST Ventricular  Rate:  119 PR Interval:    QRS Duration: 79 QT Interval:  297 QTC Calculation: 418 R Axis:   74 Text Interpretation: Sinus tachycardia Ventricular premature complex Aberrant conduction of SV complex(es) Borderline repolarization abnormality Confirmed by Davonna Belling [phone]) on 08/06/2020 1:23:22 PM   Radiology CT Abdomen Pelvis Wo Contrast  Result Date: 08/06/2020 CLINICAL DATA:  Abdominal pain and distension EXAM: CT ABDOMEN AND PELVIS WITHOUT CONTRAST TECHNIQUE: Multidetector CT imaging of the abdomen and pelvis was performed following the standard protocol without IV contrast. COMPARISON:  07/24/2020 FINDINGS: Lower chest: No acute abnormality. Hepatobiliary: Multiple hypodensities are noted scattered throughout the liver consistent with the metastatic lesions seen previously. The overall appearance  is stable from the prior exam. Gallbladder is partially distended. Pancreas: Unremarkable. No pancreatic ductal dilatation or surrounding inflammatory changes. Spleen: Normal in size without focal abnormality. Adrenals/Urinary Tract: Adrenal glands are within normal limits. Kidneys demonstrate a normal appearance bilaterally. Tiny nonobstructing right renal stone is noted in the lower pole. No obstructive changes are seen. The bladder is well distended. Stomach/Bowel: Scattered diverticular change of the colon is noted without evidence of diverticulitis. No obstructive changes are seen. The appendix is within normal limits. Small bowel and stomach are unremarkable. Vascular/Lymphatic: Abdominal aorta demonstrates atherosclerotic calcification as well as a stent graft in satisfactory position. No aneurysmal dilatation is seen. No significant lymphadenopathy is noted. Reproductive: Uterus and bilateral adnexa are unremarkable. Other: Mild free fluid is noted within the pelvis slightly increased when compared with the prior exam. Mild fluid is noted along the liver is well. Musculoskeletal: Degenerative changes of lumbar spine are noted. IMPRESSION: Stable appearing hepatic enlargement with underlying hepatic metastatic disease. New mild ascites not significant to allow for safe paracentesis. Tiny nonobstructing right renal stone. Aortic stent graft in satisfactory position. No aneurysmal dilatation is seen. Diverticulosis without diverticulitis. No obstructive changes are seen. Electronically Signed   By: Inez Catalina M.D.   On: 08/06/2020 14:52   DG Chest Port 1 View  Result Date: 08/06/2020 CLINICAL DATA:  Sepsis, history of lung liver cancer, generalized abdominal pain for 3 days EXAM: PORTABLE CHEST 1 VIEW COMPARISON:  CT 07/24/2020 FINDINGS: Persistent regions of architectural distortion in the right upper lobe and left mid lung may correspond with areas of previously treated malignancy. No superimposed  consolidative process or edema is seen. Likely nipple shadow projecting over the left lung base. No pneumothorax or effusion. Stable cardiomediastinal contours with a calcified aorta. No acute osseous or soft tissue abnormality. Telemetry leads overlie the chest. IMPRESSION: Persistent regions of architectural distortion in the right upper lobe and left mid lung may correspond with areas of previously treated malignancy. No acute cardiopulmonary abnormality. Probable nipple shadow in the left lung base. If there is clinical concern, could consider repeat imaging with nipple markers. Electronically Signed   By: Lovena Le M.D.   On: 08/06/2020 16:19    Procedures .Critical Care Performed by: Alfredia Client, PA-C Authorized by: Alfredia Client, PA-C   Critical care provider statement:    Critical care time (minutes):  45   Critical care was necessary to treat or prevent imminent or life-threatening deterioration of the following conditions:  Sepsis   Critical care was time spent personally by me on the following activities:  Discussions with consultants, evaluation of patient's response to treatment, examination of patient, ordering and performing treatments and interventions, ordering and review of laboratory studies, ordering and review of radiographic studies, pulse oximetry, re-evaluation of patient's condition, obtaining history  from patient or surrogate and review of old charts     Medications Ordered in ED Medications  lactated ringers infusion ( Intravenous New Bag/Given 08/06/20 1442)  sodium chloride 0.9 % bolus 1,000 mL (0 mLs Intravenous Stopped 08/06/20 1339)  morphine 4 MG/ML injection 4 mg (4 mg Intravenous Given 08/06/20 1256)  lactated ringers bolus 500 mL (0 mLs Intravenous Stopped 08/06/20 1401)    And  lactated ringers bolus 250 mL (0 mLs Intravenous Stopped 08/06/20 1442)  cefTRIAXone (ROCEPHIN) 2 g in sodium chloride 0.9 % 100 mL IVPB (0 g Intravenous Stopped 08/06/20 1435)   metroNIDAZOLE (FLAGYL) IVPB 500 mg (0 mg Intravenous Stopped 08/06/20 1408)    ED Course  I have reviewed the triage vital signs and the nursing notes.  Pertinent labs & imaging results that were available during my care of the patient were reviewed by me and considered in my medical decision making (see chart for details).    MDM Rules/Calculators/A&P                          Chelsea Baker is a 75 y.o. female with pertinent past medical history of non-small cell squamous lung cancer diagnosed in 2019 with liver metastasis in 2021 followed by Dr. [name], COPD, tachycardia, dilated cardiomyopathy with LVEF 35 to 40%, hypertension, hyperlipidemia, GERD, AAA that presents the emergency department today for abdominal pain.   I am concern for SBO at this time, however differential to include worsening metastatic disease, appendicitis, diverticulitis, colitis, gastroenteritis, UTI.  Initial interventions morphine.   ECG interpreted by me demonstrated sinus tachycardia, per to review appears as if patient does have baseline tachycardia .  Work-up reveals lactic acid of 2.4 with white count of 13.9.  Will initiate code sepsis at this time, will start antibiotics for intra-abdominal source since patient is having severe abdominal tenderness.  Other labs show CMP with sodium of 133, chloride of 93, creatinine of 2.44, was 1.9 611 days ago.  Elevated transaminase consistent with liver disease, appears baseline.  CT shows stable appearing metastatic disease.  No other acute intra-abdominal pathology.  Urinalysis suggestive of questionable UTI, most likely source.  Chest x-ray unremarkable. Upon reassessment patient states that she feels slightly better with morphine, patient will need to be admitted at this time, cancer patient with sepsis with worsening kidney function.  Patient admitted to Dr. Sarajane Jews.  The patient appears reasonably stabilized for admission considering the current resources,  flow, and capabilities available in the ED at this time, and I doubt any other [facility] requiring further screening and/or treatment in the ED prior to admission.  I discussed this case with my attending physician who cosigned this note including patient's presenting symptoms, physical exam, and planned diagnostics and interventions. Attending physician stated agreement with plan or made changes to plan which were implemented.   Attending physician assessed patient at bedside.  Final Clinical Impression(s) / ED Diagnoses Final diagnoses:  Sepsis with acute renal failure without septic shock, due to unspecified organism, unspecified acute renal failure type [facility])    Rx / DC Orders ED Discharge Orders    None       Alfredia Client, PA-C 08/06/20 1734    Davonna Belling, MD 08/08/20 1520

## 2020-08-06 NOTE — ED Triage Notes (Signed)
Pt BIB EMS from home. Pt has lung and liver cancer. Pt c/o generalized abdominal pain x3 days. Pt denies SOB.  HR 120 CBG 82 18 Resp 97% RA

## 2020-08-06 NOTE — ED Notes (Signed)
Patient was given an ice pack for her stomach.

## 2020-08-06 NOTE — ED Notes (Signed)
Xray bedside.

## 2020-08-06 NOTE — ED Notes (Signed)
Patient transported to CT 

## 2020-08-07 DIAGNOSIS — R1013 Epigastric pain: Secondary | ICD-10-CM

## 2020-08-07 LAB — COMPREHENSIVE METABOLIC PANEL
ALT: 37 U/L (ref 0–44)
AST: 85 U/L — ABNORMAL HIGH (ref 15–41)
Albumin: 2.5 g/dL — ABNORMAL LOW (ref 3.5–5.0)
Alkaline Phosphatase: 437 U/L — ABNORMAL HIGH (ref 38–126)
Anion gap: 12 (ref 5–15)
BUN: 36 mg/dL — ABNORMAL HIGH (ref 8–23)
CO2: 23 mmol/L (ref 22–32)
Calcium: 8.4 mg/dL — ABNORMAL LOW (ref 8.9–10.3)
Chloride: 96 mmol/L — ABNORMAL LOW (ref 98–111)
Creatinine, Ser: 2.15 mg/dL — ABNORMAL HIGH (ref 0.44–1.00)
GFR, Estimated: 24 mL/min — ABNORMAL LOW (ref >60)
Glucose, Bld: 80 mg/dL (ref 70–99)
Potassium: 5 mmol/L (ref 3.5–5.1)
Sodium: 131 mmol/L — ABNORMAL LOW (ref 135–145)
Total Bilirubin: 1.1 mg/dL (ref 0.3–1.2)
Total Protein: 5.7 g/dL — ABNORMAL LOW (ref 6.5–8.1)

## 2020-08-07 LAB — CBC
HCT: 36 % (ref 36.0–46.0)
Hemoglobin: 10.9 g/dL — ABNORMAL LOW (ref 12.0–15.0)
MCH: 27.9 pg (ref 26.0–34.0)
MCHC: 30.3 g/dL (ref 30.0–36.0)
MCV: 92.1 fL (ref 80.0–100.0)
Platelets: 313 10*3/uL (ref 150–400)
RBC: 3.91 MIL/uL (ref 3.87–5.11)
RDW: 17.6 % — ABNORMAL HIGH (ref 11.5–15.5)
WBC: 12.9 10*3/uL — ABNORMAL HIGH (ref 4.0–10.5)
nRBC: 0 % (ref 0.0–0.2)

## 2020-08-07 MED ORDER — ALUM & MAG HYDROXIDE-SIMETH 200-200-20 MG/5ML PO SUSP
30.0000 mL | ORAL | Status: DC | PRN
  Filled 2020-08-07: qty 30

## 2020-08-07 MED ORDER — POLYETHYLENE GLYCOL 3350 17 G PO PACK
17.0000 g | PACK | Freq: Two times a day (BID) | ORAL | Status: DC
  Administered 2020-08-07 – 2020-08-14 (×4): 17 g via ORAL
  Filled 2020-08-07 (×13): qty 1

## 2020-08-07 MED ORDER — LACTATED RINGERS IV SOLN: INTRAVENOUS | Status: DC

## 2020-08-07 MED ORDER — SIMETHICONE 80 MG PO CHEW: 80.0000 mg | CHEWABLE_TABLET | Freq: Four times a day (QID) | ORAL | Status: DC | PRN

## 2020-08-07 MED ORDER — BISACODYL 10 MG RE SUPP: 10.0000 mg | Freq: Every day | RECTAL | Status: DC | PRN

## 2020-08-07 MED ORDER — SENNA 8.6 MG PO TABS
2.0000 | ORAL_TABLET | Freq: Every day | ORAL | Status: DC
  Administered 2020-08-07 – 2020-08-13 (×2): 17.2 mg via ORAL
  Filled 2020-08-07 (×8): qty 2

## 2020-08-07 NOTE — ED Notes (Signed)
Pt called out stating she felt short of breath, oxygen found to be 85% on RA upon entering room. Pt repositioned in bed and sat up without improvement in O2 level. Pt placed on 2L [place] and saturation improved to 95-97% and patient reported improvement.

## 2020-08-07 NOTE — Progress Notes (Addendum)
PROGRESS NOTE   Chelsea Baker  LZJ:[id number]    DOB: 1946/06/17    DOA: 08/06/2020  PCP: Chelsea Baker   I have briefly reviewed patients previous medical records in [facility].  Chief Complaint  Patient presents with  . Abdominal Pain    Brief Narrative:  75 year old female with history of non-small cell lung cancer with progressive liver metastasis, not responsive to first-line chemo + immunotherapy, follows with Dr. Irene Baker, oncology and waiting on mutational profile to see if there are any other targetable mutations for treatment, presented with 3 days of abdominal pain and initial concern for sepsis.  At this point, infectious etiology felt less likely and abdominal pain likely related to progressive liver mets.   Assessment & Plan:  Principal Problem:   Abdominal pain Active Problems:   Dilated cardiomyopathy (HCC)   Non-small cell lung cancer metastatic to liver (HCC)   AKI (acute kidney injury) ([place])   CKD (chronic kidney disease), stage III (HCC)   Abdominal pain Likely related to metastatic liver disease.  CT abdomen pelvis without contrast 2/8: Stable appearing hepatic enlargement with underlying hepatic metastatic disease, new mild ascites not enough for safe paracentesis.  I communicated with her primary oncologist on 2/9: Notes as documented above in brief narrative.  He did initiate [place] discussions and palliative care during the last visit.  Supportive treatment of pain and initiate bowel regimen for constipation which may also help.  No UTI symptoms, discontinued antibiotics.  SIRS Met criteria on admission.  Initially presumed to have UTI although she did not have any urinary symptoms.  Low index of suspicion for SBP to given small volume of ascites.  Discontinued antibiotics.  Sepsis ruled out.  Acute kidney injury complicating CKD stage IIIa Most likely due to malignancy related poor appetite and poor oral intake.  Creatinine has improved from  2.44-2.15.  Continue gentle IV fluids and follow BMP in a.m.  Avoid nephrotoxic's.  Constipation Aggressive bowel regimen which may help with her abdominal pain as well.  Anemia of malignancy and cancer treatment Stable  Non-small cell lung cancer with progressive liver meta stasis Management as noted above.  Mildly abnormal LFTs likely related to this.  Dilated cardiomyopathy, LVEF 02-40%, grade 1 diastolic dysfunction Clinically on the dry side.  Continue gentle IV fluids with close monitoring.  Body mass index is 25.96 kg/m.    DVT prophylaxis: heparin injection 5,000 Units Start: 08/06/20 2200     Code Status: DNR Family Communication: None at bedside Disposition:  Status is: Inpatient  Remains inpatient appropriate because:Inpatient level of care appropriate due to severity of illness   Dispo: The patient is from: Home              Anticipated d/c is to: Home              Anticipated d/c date is: 2 days              Patient currently is not medically stable to d/c.   Difficult to place patient No        Consultants:   None  Procedures:   None  Antimicrobials:    Anti-infectives (From admission, onward)   Start     Dose/Rate Route Frequency Ordered Stop   08/07/20 1400  cefTRIAXone (ROCEPHIN) 2 g in sodium chloride 0.9 % 100 mL IVPB  Status:  Discontinued        2 g 200 mL/hr over 30 Minutes Intravenous Every 24 hours  08/06/20 1931 08/07/20 0748   08/06/20 1345  cefTRIAXone (ROCEPHIN) 2 g in sodium chloride 0.9 % 100 mL IVPB        2 g 200 mL/hr over 30 Minutes Intravenous  Once 08/06/20 1333 08/06/20 1435   08/06/20 1345  metroNIDAZOLE (FLAGYL) IVPB 500 mg        500 mg 100 mL/hr over 60 Minutes Intravenous  Once 08/06/20 1333 08/06/20 1408        Subjective:  Patient seen this morning while she was still in the ED waiting for a room upstairs.  Reports new onset of 9/10 abdominal pain prompting this admission.  Abdominal pain has improved, now 6/10.   No BM for 4 to 5 days.  Passing flatus.  Intermittent nausea without vomiting.  Markedly decreased appetite.  Denies dysuria.  Not on home oxygen.  Lives with daughter and independent.  Objective:   Vitals:   08/07/20 1349 08/07/20 1351 08/07/20 1411 08/07/20 1438  BP:  (!) 141/84 (!) 155/92   Pulse:  91 (!) 101   Resp: 14 15 18    Temp:  97.9 F (36.6 C) 97.6 F (36.4 C)   TempSrc:  Axillary Oral   SpO2:  95% 92%   Weight:    60.3 kg  Height:    5' (1.524 m)    General exam: Elderly female, moderately built, frail and chronically ill looking lying propped up in bed.  Oral mucosa dry. Respiratory system: Clear to auscultation. Respiratory effort normal. Cardiovascular system: S1 & S2 heard, RRR. No JVD, murmurs, rubs, gallops or clicks. No pedal edema.  Telemetry personally reviewed: SR, intermittent mild ST. Gastrointestinal system: Abdomen with epigastric fullness and appreciate ill-defined mass with tenderness but no rigidity, guarding or rebound.  Not pulsatile. No other organomegaly or masses felt. Normal bowel sounds heard. Central nervous system: Alert and oriented. No focal neurological deficits. Extremities: Symmetric 5 x 5 power. Skin: No rashes, lesions or ulcers Psychiatry: Judgement and insight appear normal. Mood & affect appropriate.     Data Reviewed:   I have personally reviewed following labs and imaging studies   CBC: Recent Labs  Lab 08/06/20 1235 08/07/20 0332  WBC 13.9* 12.9*  NEUTROABS 12.4*  --   HGB 13.9 10.9*  HCT 45.3 36.0  MCV 90.1 92.1  PLT 439* 903    Basic Metabolic Panel: Recent Labs  Lab 08/06/20 1235 08/07/20 0332  NA 133* 131*  K 5.1 5.0  CL 93* 96*  CO2 20* 23  GLUCOSE 85 80  BUN 41* 36*  CREATININE 2.44* 2.15*  CALCIUM 9.5 8.4*    Liver Function Tests: Recent Labs  Lab 08/06/20 1235 08/07/20 0332  AST 93* 85*  ALT 46* 37  ALKPHOS 656* 437*  BILITOT 2.2* 1.1  PROT 7.6 5.7*  ALBUMIN 3.4* 2.5*    CBG: No  results for input(s): GLUCAP in the last 168 hours.  Microbiology Studies:   Recent Results (from the past 240 hour(s))  Blood culture (routine x 2)     Status: None (Preliminary result)   Collection Time: 08/06/20 12:35 PM   Specimen: BLOOD  Result Value Ref Range Status   Specimen Description   Final    BLOOD RIGHT ANTECUBITAL Performed at [facility], [place] [address] [place], [place] [address]    Special Requests   Final    BOTTLES DRAWN AEROBIC AND ANAEROBIC Blood Culture adequate volume Performed at [place] [address], [place], [place] [address]  Culture   Final    NO GROWTH < 24 HOURS Performed at [facility], [place] [address], [place], [place] [address]    Report Status PENDING  Incomplete  Resp Panel by RT-PCR (Flu A&B, Covid) Nasopharyngeal Swab     Status: None   Collection Time: 08/06/20  5:28 PM   Specimen: Nasopharyngeal Swab; Nasopharyngeal(NP) swabs in vial transport medium  Result Value Ref Range Status   SARS Coronavirus 2 by RT PCR NEGATIVE NEGATIVE Final    Comment: (NOTE) SARS-CoV-2 target nucleic acids are NOT DETECTED.  The SARS-CoV-2 RNA is generally detectable in upper respiratory specimens during the acute phase of infection. The lowest concentration of SARS-CoV-2 viral copies this assay can detect is 138 copies/mL. A negative result does not preclude SARS-Cov-2 infection and should not be used as the sole basis for treatment or other patient management decisions. A negative result may occur with  improper specimen collection/handling, submission of specimen other than nasopharyngeal swab, presence of viral mutation(s) within the areas targeted by this assay, and inadequate number of viral copies(<138 copies/mL). A negative result must be combined with clinical observations, patient history, and epidemiological information. The expected result is Negative.  Fact Sheet for Patients:   EntrepreneurPulse.com.au  Fact Sheet for Healthcare Providers:  IncredibleEmployment.be  This test is no t yet approved or cleared by the [place] FDA and  has been authorized for detection and/or diagnosis of SARS-CoV-2 by FDA under an Emergency Use Authorization (EUA). This EUA will remain  in effect (meaning this test can be used) for the duration of the COVID-19 declaration under Section 564(b)(1) of the Act, 21 U.S.C.section 360bbb-3(b)(1), unless the authorization is terminated  or revoked sooner.       Influenza A by PCR NEGATIVE NEGATIVE Final   Influenza B by PCR NEGATIVE NEGATIVE Final    Comment: (NOTE) The Xpert Xpress SARS-CoV-2/FLU/RSV plus assay is intended as an aid in the diagnosis of influenza from Nasopharyngeal swab specimens and should not be used as a sole basis for treatment. Nasal washings and aspirates are unacceptable for Xpert Xpress SARS-CoV-2/FLU/RSV testing.  Fact Sheet for Patients: EntrepreneurPulse.com.au  Fact Sheet for Healthcare Providers: IncredibleEmployment.be  This test is not yet approved or cleared by the [place] FDA and has been authorized for detection and/or diagnosis of SARS-CoV-2 by FDA under an Emergency Use Authorization (EUA). This EUA will remain in effect (meaning this test can be used) for the duration of the COVID-19 declaration under Section 564(b)(1) of the Act, 21 U.S.C. section 360bbb-3(b)(1), unless the authorization is terminated or revoked.  Performed at [facility], [place] [address] [place], [place] [address]      Radiology Studies:  CT Abdomen Pelvis Wo Contrast  Result Date: 08/06/2020 CLINICAL DATA:  Abdominal pain and distension EXAM: CT ABDOMEN AND PELVIS WITHOUT CONTRAST TECHNIQUE: Multidetector CT imaging of the abdomen and pelvis was performed following the standard protocol without IV contrast.  COMPARISON:  07/24/2020 FINDINGS: Lower chest: No acute abnormality. Hepatobiliary: Multiple hypodensities are noted scattered throughout the liver consistent with the metastatic lesions seen previously. The overall appearance is stable from the prior exam. Gallbladder is partially distended. Pancreas: Unremarkable. No pancreatic ductal dilatation or surrounding inflammatory changes. Spleen: Normal in size without focal abnormality. Adrenals/Urinary Tract: Adrenal glands are within normal limits. Kidneys demonstrate a normal appearance bilaterally. Tiny nonobstructing right renal stone is noted in the lower pole. No obstructive changes are seen. The bladder is well distended. Stomach/Bowel: Scattered  diverticular change of the colon is noted without evidence of diverticulitis. No obstructive changes are seen. The appendix is within normal limits. Small bowel and stomach are unremarkable. Vascular/Lymphatic: Abdominal aorta demonstrates atherosclerotic calcification as well as a stent graft in satisfactory position. No aneurysmal dilatation is seen. No significant lymphadenopathy is noted. Reproductive: Uterus and bilateral adnexa are unremarkable. Other: Mild free fluid is noted within the pelvis slightly increased when compared with the prior exam. Mild fluid is noted along the liver is well. Musculoskeletal: Degenerative changes of lumbar spine are noted. IMPRESSION: Stable appearing hepatic enlargement with underlying hepatic metastatic disease. New mild ascites not significant to allow for safe paracentesis. Tiny nonobstructing right renal stone. Aortic stent graft in satisfactory position. No aneurysmal dilatation is seen. Diverticulosis without diverticulitis. No obstructive changes are seen. Electronically Signed   By: Inez Catalina M.D.   On: 08/06/2020 14:52   DG Chest Port 1 View  Result Date: 08/06/2020 CLINICAL DATA:  Sepsis, history of lung liver cancer, generalized abdominal pain for 3 days EXAM:  PORTABLE CHEST 1 VIEW COMPARISON:  CT 07/24/2020 FINDINGS: Persistent regions of architectural distortion in the right upper lobe and left mid lung may correspond with areas of previously treated malignancy. No superimposed consolidative process or edema is seen. Likely nipple shadow projecting over the left lung base. No pneumothorax or effusion. Stable cardiomediastinal contours with a calcified aorta. No acute osseous or soft tissue abnormality. Telemetry leads overlie the chest. IMPRESSION: Persistent regions of architectural distortion in the right upper lobe and left mid lung may correspond with areas of previously treated malignancy. No acute cardiopulmonary abnormality. Probable nipple shadow in the left lung base. If there is clinical concern, could consider repeat imaging with nipple markers. Electronically Signed   By: Lovena Le M.D.   On: 08/06/2020 16:19     Scheduled Meds:   . budesonide  0.5 mg Nebulization BID  . heparin  5,000 Units Subcutaneous Q8H  . metoprolol succinate  50 mg Oral Daily  . polyethylene glycol  17 g Oral BID  . senna  2 tablet Oral Daily  . sodium chloride flush  3 mL Intravenous Q12H    Continuous Infusions:   . lactated ringers 50 mL/hr at 08/07/20 0813     LOS: 1 day     Vernell Leep, MD, [place], [facility]. [facility]    To contact the attending provider between 7A-7P or the covering provider during after hours 7P-7A, please log into the web site [email or web address] and access using universal [place] password for that web site. If you do not have the password, please call the hospital operator.  08/07/2020, 4:34 PM

## 2020-08-07 NOTE — ED Notes (Signed)
Patient assisted to a recliner to eat her lunch. Still on 1 L [place].

## 2020-08-07 NOTE — ED Notes (Signed)
Attempted to take pt off of 1L of O2 via [place], oxygen 87% RA. 1L [place] reapplied.

## 2020-08-07 NOTE — ED Notes (Signed)
Pt assisted to bedside commode

## 2020-08-08 DIAGNOSIS — C787 Secondary malignant neoplasm of liver and intrahepatic bile duct: Principal | ICD-10-CM

## 2020-08-08 DIAGNOSIS — Z7189 Other specified counseling: Secondary | ICD-10-CM | POA: Diagnosis not present

## 2020-08-08 DIAGNOSIS — C3411 Malignant neoplasm of upper lobe, right bronchus or lung: Secondary | ICD-10-CM | POA: Diagnosis not present

## 2020-08-08 LAB — CBC
HCT: 39.2 % (ref 36.0–46.0)
Hemoglobin: 11.8 g/dL — ABNORMAL LOW (ref 12.0–15.0)
MCH: 27.3 pg (ref 26.0–34.0)
MCHC: 30.1 g/dL (ref 30.0–36.0)
MCV: 90.7 fL (ref 80.0–100.0)
Platelets: 330 10*3/uL (ref 150–400)
RBC: 4.32 MIL/uL (ref 3.87–5.11)
RDW: 17.5 % — ABNORMAL HIGH (ref 11.5–15.5)
WBC: 13.1 10*3/uL — ABNORMAL HIGH (ref 4.0–10.5)
nRBC: 0 % (ref 0.0–0.2)

## 2020-08-08 LAB — BASIC METABOLIC PANEL
Anion gap: 15 (ref 5–15)
BUN: 39 mg/dL — ABNORMAL HIGH (ref 8–23)
CO2: 20 mmol/L — ABNORMAL LOW (ref 22–32)
Calcium: 8.9 mg/dL (ref 8.9–10.3)
Chloride: 97 mmol/L — ABNORMAL LOW (ref 98–111)
Creatinine, Ser: 2.16 mg/dL — ABNORMAL HIGH (ref 0.44–1.00)
GFR, Estimated: 23 mL/min — ABNORMAL LOW (ref >60)
Glucose, Bld: 77 mg/dL (ref 70–99)
Potassium: 5.1 mmol/L (ref 3.5–5.1)
Sodium: 132 mmol/L — ABNORMAL LOW (ref 135–145)

## 2020-08-08 LAB — URINE CULTURE: Culture: <10000 — AB

## 2020-08-08 MED ORDER — ADULT MULTIVITAMIN W/MINERALS CH
1.0000 | ORAL_TABLET | Freq: Every day | ORAL | Status: DC
  Administered 2020-08-08 – 2020-08-12 (×5): 1 via ORAL
  Filled 2020-08-08 (×6): qty 1

## 2020-08-08 MED ORDER — DEXAMETHASONE 4 MG PO TABS
4.0000 mg | ORAL_TABLET | Freq: Every day | ORAL | Status: DC
  Administered 2020-08-08 – 2020-08-14 (×7): 4 mg via ORAL
  Filled 2020-08-08 (×7): qty 1

## 2020-08-08 MED ORDER — ENSURE ENLIVE PO LIQD
237.0000 mL | Freq: Two times a day (BID) | ORAL | Status: DC
  Administered 2020-08-08 – 2020-08-12 (×3): 237 mL via ORAL

## 2020-08-08 MED ORDER — PROSOURCE PLUS PO LIQD
30.0000 mL | Freq: Two times a day (BID) | ORAL | Status: DC
  Administered 2020-08-08 – 2020-08-09 (×2): 30 mL via ORAL
  Filled 2020-08-08 (×5): qty 30

## 2020-08-08 NOTE — Progress Notes (Addendum)
PROGRESS NOTE   Chelsea Baker  YNW:[id number]    DOB: Nov 11, 1945    DOA: 08/06/2020  PCP: Elise Benne   I have briefly reviewed patients previous medical records in [facility].  Chief Complaint  Patient presents with  . Abdominal Pain    Brief Narrative:  75 year old female with history of non-small cell lung cancer with progressive liver metastasis, not responsive to first-line chemo + immunotherapy, follows with Dr. [name], oncology and waiting on mutational profile to see if there are any other targetable mutations for treatment, presented with 3 days of abdominal pain and initial concern for sepsis.  At this point, infectious etiology felt less likely and abdominal pain likely related to progressive liver mets.  Oncology consulted 2/10.   Assessment & Plan:  Principal Problem:   Abdominal pain Active Problems:   Dilated cardiomyopathy (HCC)   Non-small cell lung cancer metastatic to liver (HCC)   AKI (acute kidney injury) ([place])   CKD (chronic kidney disease), stage III (HCC)   Abdominal pain suspected due to progressive liver mets Likely related to metastatic liver disease.  CT abdomen pelvis without contrast 2/8: Stable appearing hepatic enlargement with underlying hepatic metastatic disease, new mild ascites not enough for safe paracentesis.  I communicated with her primary oncologist on 2/9: Notes as documented above in brief narrative.  He did initiate [place] discussions and palliative care during the last visit.  Supportive treatment of pain and initiate bowel regimen for constipation which may also help.  No UTI symptoms, discontinued antibiotics. Oncology consultation 2/10 appreciated: Reportedly had been started on dexamethasone 4 mg twice daily to help with capsular pain but patient had stopped due to sleep interference.  They have resumed dexamethasone 4 mg daily at noontime and have consulted palliative care for symptom management, goals of care.  Await  palliative input.  SIRS Met criteria on admission.  Initially presumed to have UTI although she did not have any urinary symptoms.  Low index of suspicion for SBP to given small volume of ascites.  Discontinued antibiotics.  Sepsis ruled out.  Acute kidney injury complicating CKD stage IIIa Most likely due to malignancy related poor appetite and poor oral intake.  Creatinine has improved from 2.44-2.15.  Creatinine has plateaued at 2.16.  Also noted some dyspnea with activity.  Hold IV fluids and follow BMP in a.m.  Hyponatremia Stable.  Could be a combination of hypovolemia or SIADH related to underlying cancer.  Stable  Constipation Aggressive bowel regimen which may help with her abdominal pain as well.  Patient however has been refusing as per nursing.  Counseled patient regarding importance of regular BMs which would help her pain as well.  Anemia of malignancy and cancer treatment Stable  Non-small cell lung cancer with progressive liver meta stasis Management as noted above.  Mildly abnormal LFTs likely related to this.  Dilated cardiomyopathy, LVEF 65-78%, grade 1 diastolic dysfunction Clinically euvolemic.  Body mass index is 25.96 kg/m.    DVT prophylaxis: heparin injection 5,000 Units Start: 08/06/20 2200     Code Status: DNR Family Communication: None at bedside Disposition:  Status is: Inpatient  Remains inpatient appropriate because:Inpatient level of care appropriate due to severity of illness   Dispo: The patient is from: Home              Anticipated d/c is to: Home              Anticipated d/c date is: 2 days  Patient currently is not medically stable to d/c.   Difficult to place patient No        Consultants:   Medical oncology PMT  Procedures:   None  Antimicrobials:    Anti-infectives (From admission, onward)   Start     Dose/Rate Route Frequency Ordered Stop   08/07/20 1400  cefTRIAXone (ROCEPHIN) 2 g in sodium chloride 0.9  % 100 mL IVPB  Status:  Discontinued        2 g 200 mL/hr over 30 Minutes Intravenous Every 24 hours 08/06/20 1931 08/07/20 0748   08/06/20 1345  cefTRIAXone (ROCEPHIN) 2 g in sodium chloride 0.9 % 100 mL IVPB        2 g 200 mL/hr over 30 Minutes Intravenous  Once 08/06/20 1333 08/06/20 1435   08/06/20 1345  metroNIDAZOLE (FLAGYL) IVPB 500 mg        500 mg 100 mL/hr over 60 Minutes Intravenous  Once 08/06/20 1333 08/06/20 1408        Subjective:  States that her abdominal pain is somewhat better.  Appeared intermittently slightly confused.  Denies dyspnea.  Per nursing, refusing constipation meds.  Objective:   Vitals:   08/08/20 0451 08/08/20 0900 08/08/20 1120 08/08/20 1317  BP: 125/90 108/77  116/70  Pulse: 98 90  88  Resp: 19   20  Temp: 98.5 F (36.9 C) 98.6 F (37 C)  (!) 97.4 F (36.3 C)  TempSrc: Oral Oral    SpO2: 91% 90% 92% 92%  Weight:      Height:        General exam: Elderly female, moderately built, frail and chronically ill looking sitting up in bed.  Appears to be in good spirits. Respiratory system: Occasional right basal crackles but otherwise clear to auscultation without wheezing or rhonchi.  At times with mild tachypnea, mostly with activity i.e. sitting up from laying down. Cardiovascular system: S1 & S2 heard, RRR. No JVD, murmurs, rubs, gallops or clicks. No pedal edema.   Gastrointestinal system: Abdomen with epigastric fullness and appreciate ill-defined mass with tenderness but no rigidity, guarding or rebound.  Not pulsatile. No other organomegaly or masses felt. Normal bowel sounds heard.  Today also appreciate?  Right upper quadrant small mass. Central nervous system: Alert and oriented x2. No focal neurological deficits. Extremities: Symmetric 5 x 5 power. Skin: No rashes, lesions or ulcers Psychiatry: Judgement and insight appear normal. Mood & affect appropriate.     Data Reviewed:   I have personally reviewed following labs and imaging  studies   CBC: Recent Labs  Lab 08/06/20 1235 08/07/20 0332 08/08/20 0601  WBC 13.9* 12.9* 13.1*  NEUTROABS 12.4*  --   --   HGB 13.9 10.9* 11.8*  HCT 45.3 36.0 39.2  MCV 90.1 92.1 90.7  PLT 439* 313 734    Basic Metabolic Panel: Recent Labs  Lab 08/06/20 1235 08/07/20 0332 08/08/20 0601  NA 133* 131* 132*  K 5.1 5.0 5.1  CL 93* 96* 97*  CO2 20* 23 20*  GLUCOSE 85 80 77  BUN 41* 36* 39*  CREATININE 2.44* 2.15* 2.16*  CALCIUM 9.5 8.4* 8.9    Liver Function Tests: Recent Labs  Lab 08/06/20 1235 08/07/20 0332  AST 93* 85*  ALT 46* 37  ALKPHOS 656* 437*  BILITOT 2.2* 1.1  PROT 7.6 5.7*  ALBUMIN 3.4* 2.5*    CBG: No results for input(s): GLUCAP in the last 168 hours.  Microbiology Studies:   Recent Results (from  the past 240 hour(s))  Blood culture (routine x 2)     Status: None (Preliminary result)   Collection Time: 08/06/20 12:35 PM   Specimen: BLOOD  Result Value Ref Range Status   Specimen Description   Final    BLOOD RIGHT ANTECUBITAL Performed at [facility], [place] [address] [place], [place] [address]    Special Requests   Final    BOTTLES DRAWN AEROBIC AND ANAEROBIC Blood Culture adequate volume Performed at [facility] [address], [place], [place] [address]    Culture   Final    NO GROWTH 2 DAYS Performed at [facility] [address], [place], [place] [address]    Report Status PENDING  Incomplete  Urine culture     Status: Abnormal   Collection Time: 08/06/20  3:35 PM   Specimen: Urine, Random  Result Value Ref Range Status   Specimen Description   Final    URINE, RANDOM Performed at [facility] [address], [place], [place] [address]    Special Requests   Final    NONE Performed at [facility], [place] [address] [place], [place] [address]    Culture (A)  Final    <10,000 COLONIES/mL INSIGNIFICANT GROWTH Performed at [facility] [address] [place], [place] [address]    Report Status 08/08/2020 FINAL  Final  Resp Panel by RT-PCR (Flu A&B, Covid) Nasopharyngeal Swab     Status: None   Collection Time: 08/06/20  5:28 PM   Specimen: Nasopharyngeal Swab; Nasopharyngeal(NP) swabs in vial transport medium  Result Value Ref Range Status   SARS Coronavirus 2 by RT PCR NEGATIVE NEGATIVE Final    Comment: (NOTE) SARS-CoV-2 target nucleic acids are NOT DETECTED.  The SARS-CoV-2 RNA is generally detectable in upper respiratory specimens during the acute phase of infection. The lowest concentration of SARS-CoV-2 viral copies this assay can detect is 138 copies/mL. A negative result does not preclude SARS-Cov-2 infection and should not be used as the sole basis for treatment or other patient management decisions. A negative result may occur with  improper specimen collection/handling, submission of specimen other than nasopharyngeal swab, presence of viral mutation(s) within the areas targeted by this assay, and inadequate number of viral copies(<138 copies/mL). A negative result must be combined with clinical observations, patient history, and epidemiological information. The expected result is Negative.  Fact Sheet for Patients:  EntrepreneurPulse.com.au  Fact Sheet for Healthcare Providers:  IncredibleEmployment.be  This test is no t yet approved or cleared by the [place] FDA and  has been authorized for detection and/or diagnosis of SARS-CoV-2 by FDA under an Emergency Use Authorization (EUA). This EUA will remain  in effect (meaning this test can be used) for the duration of the COVID-19 declaration under Section 564(b)(1) of the Act, 21 U.S.C.section 360bbb-3(b)(1), unless the authorization is terminated  or revoked sooner.       Influenza A by PCR NEGATIVE NEGATIVE Final   Influenza B by PCR NEGATIVE NEGATIVE Final    Comment: (NOTE) The Xpert Xpress SARS-CoV-2/FLU/RSV  plus assay is intended as an aid in the diagnosis of influenza from Nasopharyngeal swab specimens and should not be used as a sole basis for treatment. Nasal washings and aspirates are unacceptable for Xpert Xpress SARS-CoV-2/FLU/RSV testing.  Fact Sheet for Patients: EntrepreneurPulse.com.au  Fact Sheet for Healthcare Providers: IncredibleEmployment.be  This test is not yet approved or cleared by the [place]  FDA and has been authorized for detection and/or diagnosis of SARS-CoV-2 by FDA under an Emergency Use Authorization (EUA). This EUA will remain in effect (meaning this test can be used) for the duration of the COVID-19 declaration under Section 564(b)(1) of the Act, 21 U.S.C. section 360bbb-3(b)(1), unless the authorization is terminated or revoked.  Performed at [facility], [place] [address] [place], [place] [address]      Radiology Studies:  No results found.   Scheduled Meds:   . (feeding supplement) PROSource Plus  30 mL Oral BID BM  . budesonide  0.5 mg Nebulization BID  . dexamethasone  4 mg Oral Daily  . feeding supplement  237 mL Oral BID BM  . heparin  5,000 Units Subcutaneous Q8H  . metoprolol succinate  50 mg Oral Daily  . multivitamin with minerals  1 tablet Oral Daily  . polyethylene glycol  17 g Oral BID  . senna  2 tablet Oral Daily  . sodium chloride flush  3 mL Intravenous Q12H    Continuous Infusions:      LOS: 2 days     Vernell Leep, MD, [place], [facility]. [facility]    To contact the attending provider between 7A-7P or the covering provider during after hours 7P-7A, please log into the web site [email or web address] and access using universal [place] password for that web site. If you do not have the password, please call the hospital operator.  08/08/2020, 5:45 PM

## 2020-08-08 NOTE — Progress Notes (Addendum)
HEMATOLOGY-ONCOLOGY PROGRESS NOTE  SUBJECTIVE: Chelsea Baker is followed by our office for metastatic lung squamous cell carcinoma. Was most recently seen on 07/26/2020 and CT scan results were discussed with her at that visit which showed disease progression. Her carboplatin and Taxol were held at the last visit but she received Keytruda. We have also requested for foundation 1 studies to be sent on her pathology to help guide further treatment.  She presented to the emergency room with abdominal pain. Pain is primarily in her right upper quadrant of the abdomen but does radiate over towards the left. She had been taking tramadol at home which helps somewhat but pain was worsening. She was started on dexamethasone 4 mg twice daily to help with capsular pain but the patient tells me that she was taking it only once a day at night and it was keeping her up all night. Therefore, she stopped taking it. She reports having some mild nausea but no vomiting. She is having some constipation. She offers no other complaints today. The patient was referred to palliative care as an outpatient. She states that she has not heard from them but it appears they have left her messages on 2 occasions.  Oncology History  Non-small cell lung cancer metastatic to liver ([place])  05/17/2020 Initial Diagnosis   Non-small cell lung cancer metastatic to liver ([place])   05/25/2020 -  Chemotherapy    Patient is on Treatment Plan: LUNG NSCLC CARBOPLATIN + PACLITAXEL + PEMBROLIZUMAB Q21D X 4 CYCLES / PEMBROLIZUMAB MAINTENANCE Q21D         REVIEW OF SYSTEMS:   Constitutional: Denies fevers, chills Eyes: Denies blurriness of vision Ears, nose, mouth, throat, and face: Denies mucositis or sore throat Respiratory: Denies cough, dyspnea or wheezes Cardiovascular: Denies palpitation, chest discomfort Gastrointestinal: Reports abdominal pain and nausea, no vomiting. Reports constipation Skin: Denies abnormal skin rashes Lymphatics:  Denies new lymphadenopathy or easy bruising Neurological:Denies numbness, tingling or new weaknesses Behavioral/Psych: Mood is stable, no new changes  Extremities: No lower extremity edema All other systems were reviewed with the patient and are negative.  I have reviewed the past medical history, past surgical history, social history and family history with the patient and they are unchanged from previous note.   PHYSICAL EXAMINATION: ECOG PERFORMANCE STATUS: 2 - Symptomatic, <50% confined to bed  Vitals:   08/08/20 0451 08/08/20 0900  BP: 125/90 108/77  Pulse: 98 90  Resp: 19   Temp: 98.5 F (36.9 C) 98.6 F (37 C)  SpO2: 91% 90%   Filed Weights   08/07/20 1438  Weight: 60.3 kg    Intake/Output from previous day: 02/09 0701 - 02/10 0700 In: 1438.7 [P.O.:120; I.V.:1318.7] Out: -   GENERAL:alert, no distress and comfortable SKIN: skin color, texture, turgor are normal, no rashes or significant lesions EYES: normal, Conjunctiva are pink and non-injected, sclera clear OROPHARYNX:no exudate, no erythema and lips, buccal mucosa, and tongue normal  LUNGS: clear to auscultation and percussion with normal breathing effort HEART: regular rate & rhythm and no murmurs and no lower extremity edema ABDOMEN: Positive bowel sounds, soft, tenderness particularly over the right upper quadrant, hepatomegaly noted Musculoskeletal:no cyanosis of digits and no clubbing  NEURO: alert & oriented x 3 with fluent speech, no focal motor/sensory deficits  LABORATORY DATA:  I have reviewed the data as listed CMP Latest Ref Rng & Units 08/08/2020 08/07/2020 08/06/2020  Glucose 70 - 99 mg/dL 77 80 85  BUN 8 - 23 mg/dL 39(H) 36(H) 41(H)  Creatinine 0.44 - 1.00 mg/dL 2.16(H) 2.15(H) 2.44(H)  Sodium 135 - 145 mmol/L 132(L) 131(L) 133(L)  Potassium 3.5 - 5.1 mmol/L 5.1 5.0 5.1  Chloride 98 - 111 mmol/L 97(L) 96(L) 93(L)  CO2 22 - 32 mmol/L 20(L) 23 20(L)  Calcium 8.9 - 10.3 mg/dL 8.9 8.4(L) 9.5  Total  Protein 6.5 - 8.1 g/dL - 5.7(L) 7.6  Total Bilirubin 0.3 - 1.2 mg/dL - 1.1 2.2(H)  Alkaline Phos 38 - 126 U/L - 437(H) 656(H)  AST 15 - 41 U/L - 85(H) 93(H)  ALT 0 - 44 U/L - 37 46(H)    Lab Results  Component Value Date   WBC 13.1 (H) 08/08/2020   HGB 11.8 (L) 08/08/2020   HCT 39.2 08/08/2020   MCV 90.7 08/08/2020   PLT 330 08/08/2020   NEUTROABS 12.4 (H) 08/06/2020    CT Abdomen Pelvis Wo Contrast  Result Date: 08/06/2020 CLINICAL DATA:  Abdominal pain and distension EXAM: CT ABDOMEN AND PELVIS WITHOUT CONTRAST TECHNIQUE: Multidetector CT imaging of the abdomen and pelvis was performed following the standard protocol without IV contrast. COMPARISON:  07/24/2020 FINDINGS: Lower chest: No acute abnormality. Hepatobiliary: Multiple hypodensities are noted scattered throughout the liver consistent with the metastatic lesions seen previously. The overall appearance is stable from the prior exam. Gallbladder is partially distended. Pancreas: Unremarkable. No pancreatic ductal dilatation or surrounding inflammatory changes. Spleen: Normal in size without focal abnormality. Adrenals/Urinary Tract: Adrenal glands are within normal limits. Kidneys demonstrate a normal appearance bilaterally. Tiny nonobstructing right renal stone is noted in the lower pole. No obstructive changes are seen. The bladder is well distended. Stomach/Bowel: Scattered diverticular change of the colon is noted without evidence of diverticulitis. No obstructive changes are seen. The appendix is within normal limits. Small bowel and stomach are unremarkable. Vascular/Lymphatic: Abdominal aorta demonstrates atherosclerotic calcification as well as a stent graft in satisfactory position. No aneurysmal dilatation is seen. No significant lymphadenopathy is noted. Reproductive: Uterus and bilateral adnexa are unremarkable. Other: Mild free fluid is noted within the pelvis slightly increased when compared with the prior exam. Mild fluid is  noted along the liver is well. Musculoskeletal: Degenerative changes of lumbar spine are noted. IMPRESSION: Stable appearing hepatic enlargement with underlying hepatic metastatic disease. New mild ascites not significant to allow for safe paracentesis. Tiny nonobstructing right renal stone. Aortic stent graft in satisfactory position. No aneurysmal dilatation is seen. Diverticulosis without diverticulitis. No obstructive changes are seen. Electronically Signed   By: Inez Catalina M.D.   On: 08/06/2020 14:52   DG Chest Port 1 View  Result Date: 08/06/2020 CLINICAL DATA:  Sepsis, history of lung liver cancer, generalized abdominal pain for 3 days EXAM: PORTABLE CHEST 1 VIEW COMPARISON:  CT 07/24/2020 FINDINGS: Persistent regions of architectural distortion in the right upper lobe and left mid lung may correspond with areas of previously treated malignancy. No superimposed consolidative process or edema is seen. Likely nipple shadow projecting over the left lung base. No pneumothorax or effusion. Stable cardiomediastinal contours with a calcified aorta. No acute osseous or soft tissue abnormality. Telemetry leads overlie the chest. IMPRESSION: Persistent regions of architectural distortion in the right upper lobe and left mid lung may correspond with areas of previously treated malignancy. No acute cardiopulmonary abnormality. Probable nipple shadow in the left lung base. If there is clinical concern, could consider repeat imaging with nipple markers. Electronically Signed   By: Lovena Le M.D.   On: 08/06/2020 16:19   CT CHEST ABDOMEN PELVIS WO CONTRAST  Result Date: 07/24/2020 CLINICAL DATA:  Stage IV squamous cell right upper lobe lung cancer diagnosed in 2019 status post radiation therapy x 3 with liver metastases and ongoing palliative chemotherapy. Patient reports new posterior right chest wall pain and tenderness. No reported injury. EXAM: CT CHEST, ABDOMEN AND PELVIS WITHOUT CONTRAST TECHNIQUE:  Multidetector CT imaging of the chest, abdomen and pelvis was performed following the standard protocol without IV contrast. COMPARISON:  04/10/2020 PET-CT. 03/11/2020 chest CT. 07/17/2017 CT abdomen/pelvis. FINDINGS: CT CHEST FINDINGS Cardiovascular: Normal heart size. No significant pericardial effusion/thickening. Three-vessel coronary atherosclerosis. Atherosclerotic nonaneurysmal thoracic aorta. Normal caliber pulmonary arteries. Mediastinum/Nodes: No discrete thyroid nodules. Unremarkable esophagus. No axillary adenopathy. Mildly enlarged 1.1 cm short axis diameter AP window node (series 2/image 19), previously 1.3 cm on 04/10/2020 PET-CT, slightly decreased. No new pathologically enlarged mediastinal nodes. Enlarged 1.8 cm left hilar node (series 2/image 27), previously 2.1 cm, slightly decreased. No right hilar adenopathy. Lungs/Pleura: No pneumothorax. No pleural effusion. Mild centrilobular emphysema. No acute consolidative airspace disease. Stable sharply marginated dense triangular consolidation in the posterior right upper lobe with associated volume loss and distortion, compatible with radiation change. Stable sharply marginated irregular bandlike consolidation in the lingula with associated volume loss and distortion, compatible with radiation change. Clustered tiny superior segment right lower lobe solid pulmonary nodules measuring up to 3 mm (series 4/image 44), stable. No new significant pulmonary nodules. Musculoskeletal: No aggressive appearing focal osseous lesions. Chronic healed posterior right fifth rib deformity. No acute fracture in the chest. Marked thoracic spondylosis. CT ABDOMEN PELVIS FINDINGS Hepatobiliary: New hepatomegaly with numerous (greater than 10) ill-defined heterogeneous hypodense liver masses scattered throughout the liver, increased in size and number since 04/10/2020 PET-CT. Representative segment 2 left liver 4.7 cm mass (series 2/image 54), new. Representative segment 3  left liver 3.2 cm mass (series 2/image 67), increased from 2.0 cm. Representative central right liver 4.5 cm mass (series 2/image 49), increased from 3.7 cm. Representative peripheral right liver 2.4 cm mass (series 2/image 48), new. Normal gallbladder with no radiopaque cholelithiasis. No biliary ductal dilatation. Pancreas: Normal, with no mass or duct dilation. Spleen: Normal size. No mass. Adrenals/Urinary Tract: Normal adrenals. No renal stones. No hydronephrosis. No contour deforming renal masses. Small cystocele. Otherwise normal nondistended bladder. Stomach/Bowel: Normal non-distended stomach. Normal caliber small bowel with no small bowel wall thickening. Normal appendix. Marked sigmoid diverticulosis with no large bowel wall thickening or significant pericolonic fat stranding. Vascular/Lymphatic: Atherosclerotic abdominal aorta with 3.1 cm infrarenal abdominal aortic aneurysm status post aorto bi-iliac stent graft. No pathologically enlarged lymph nodes in the abdomen or pelvis. Reproductive: Grossly normal uterus.  No adnexal mass. Other: No pneumoperitoneum, ascites or focal fluid collection. Musculoskeletal: No aggressive appearing focal osseous lesions. Mild lumbar spondylosis. IMPRESSION: 1. Progression of widespread bulky liver metastatic disease with new hepatomegaly. 2. Left hilar and mediastinal adenopathy is slightly decreased. 3. Stable radiation change in the right upper and lingula, with no evidence of local tumor recurrence. 4. Aortic Atherosclerosis (ICD10-I70.0) and Emphysema (ICD10-J43.9). Electronically Signed   By: Ilona Sorrel M.D.   On: 07/24/2020 17:03    ASSESSMENT AND PLAN: Chelsea Baker is a 75 y.o. female with   1) Metastatic lung squamous cell carcinoma Previous h/o right upper lobe squamous cell carcinoma of the lung. Stage I Presenting with a cavitary RUL mass.Biopsy proven to be squamous cell carcinoma. No overt mediastinal or hilar lymphadenopathy  noted. PET/CT showed no overt metastatic disease CT head neg for mets. Patient declined MRI S/p  SBRT 60 Gy in 5 fractions 09/01/17 to 09/10/17  CT Chest 10/22/17 shows improvement in the right upper lobe mass after [facility]  03/01/18 CT Chest revealed Continued reduction in spiculated posterior right upper lobe pulmonary nodule. 2. Left upper lobe 1.6 cm spiculated pulmonary nodule is increased in size and density, compatible with malignancy, either a second primary bronchogenic carcinoma or contralateral metastasis. 3. No thoracic adenopathy.  03/18/18 PET/CT revealed Intense metabolic activity associated with enlarging LEFT upper lobe pulmonary nodule is most suggestive primary bronchogenic Carcinoma. 2. Mild to moderate activity associated treated pulmonary nodule in the RIGHT upper lobe with associated moderate metabolic activity in adjacent chest wall favored post radiation change inflammation. 3. Stable additional RIGHT lower lobe pulmonary nodule. 4. No new pulmonary nodules evident. 5. No evidence of metastatic mediastinal adenopathy or distant disease.  07/12/18 CT Chest which revealed 2.2 x 1.2 cm irregular/spiculated posterior right upper lobe nodule, grossly unchanged. 5 x 10 mm left upper lobe nodule, decreased. Aortic Atherosclerosis and Emphysema.  05/16/2019 CT Chest Wo Contrast (Accession [id number]) which revealed "Radiation changes in the posterior right upper lobe and left upper lobe/lingula.10 mm lingular nodule, progressive, suspicious for recurrence."  09/07/2019 CT Chest ([id number]) revealed "1. Recently treated lingular pulmonary nodule is mildly decreased in size. 2. Masslike fibrosis in the posterior right upper lobe is mildly increased, equivocal for continued evolution of postradiation change versus early recurrence. 3. Otherwise no new or progressive metastatic disease in the chest."  04/10/2020 PET/CT ([id number]) revealed "1. Local hypermetabolic nodal metastatic  recurrence in the prevascular nodal station and LEFT hilum. 2. Bands of fibrotic change and consolidation in LEFT upper lobe and RIGHT upper lobe with low metabolic activity are favored post therapy benign findings. 3. Unfortunately, distant metastatic disease with new multifocal hypermetabolic HEPATIC METASTASIS."   2) Now with Left upper lobe 1.6 cm spiculated pulmonary nodule- increasing in size. Seen on the 03/18/18 PET/CT as noted above. Newly identified Left enlarging nodule is suspected to be a new primary, and would be considered a new Stage I tumor S/p 54 Gy in 3 fractions 04/06/18 to 04/13/18  3) Dyspnea and rest and on minimal exertion. - much improved CT chest with no PE  significant COPD at baseline -  Needing 2-3 L of oxygen by nasal cannula in hospital.and is now off oxygen ECHO ef 35-40% -Pulmonary function testing -severe obstruction - optimize rx -noted to have systolic cardiomyopathy - mx Dr Ellyn Hack. -Continue follow up with Dr. Sherrilyn Rist in Pulmonology as an outpatient  4) Poor medical f/u.     Past Medical History:  Diagnosis Date  . AAA (abdominal aortic aneurysm) ([place])   . AAA (abdominal aortic aneurysm) without rupture ([place]) 04/10/2014  . Abnormal echocardiogram 08/10/2017  . Aftercare following surgery of the circulatory system 04/10/2014  . Aftercare following surgery of the circulatory system, [place] 11/15/2012  . Cavitating mass in right upper lung lobe 07/12/2017  . Chest discomfort 08/10/2017  . Counseling regarding advance care planning and goals of care 05/17/2020  . Dilated cardiomyopathy ([place]) 07/17/2017   Echocardiogram July 17, 2017: EF 35-40%.  Septal mid and basal inferior wall hypokinesis.  Mildly dilated LV.  Mildly reduced EF.  GR 1 DD.  Calcified mitral annulus.    . Diverticulosis   . GERD (gastroesophageal reflux disease)   . Healthcare maintenance 12/21/2017  . Hiatal hernia   . Hyperlipidemia 12/21/2017  . Hypertension   .  Hypokalemia   . Hyponatremia 07/12/2017  . Hypoxia   .  Lumbar spondylolysis   . Lung cancer ([place]) dx'd 2019  . Lung tumor   . Malignant neoplasm of upper lobe of left lung ([place]) 03/22/2018  . Malignant neoplasm of upper lobe of right lung ([place]) 07/12/2017  . Non-small cell lung cancer metastatic to liver ([place]) 05/17/2020  . On home O2    2L N/C  . Protein-calorie malnutrition ([place])   . Shortness of breath   . Tachycardia 07/12/2017   4) Protein calorie malnutrition -nutritional consultation  5) Heavy smoker 75-100 pk-yrs -Pt reported on 08/12/17 that she has completely quit smoking and plans to never smoke again.  -Pt has continued smoking cessation as of 03/08/18 and was encouraged to keep up her good work.  6) abdominal pain secondary to hepatomegaly/liver metastasis  PLAN: -Discussed pt labwork; blood counts stable, chemistries show elevated BUN and creatinine with elevated LFTs and rising T bili from lab work on admission. -Discussed CT Chest/Abd/Pel wo contrast ([id number]) on 07/24/2020; tumor is progressing and not behaving as standard. The lack of response is either not responding to standard treatments and growing through primary treatments or if this is a tumor from the liver itself and so poorly differentiated. Repeat CT scan performed 08/06/2020 on admission appears to be stable. -Discussed possible approaches moving forward-- get Bm Bx, tumor markers, and blood tests to figure out exact cancer and future treatments or pursuing best supportive cares without further workup/treatment. The pt wishes to continue to explore options. Foundation 1 testing is currently pending. -The patient was started on dexamethasone 4 mg twice daily for pain relief as an outpatient. She was only taking it once a day at nighttime and was having difficulty sleeping so she stopped this. We discussed restarting this medication. We will start her on 4 mg daily at noon time since she does not eat  breakfast. Continue oxycodone and Dilaudid as needed for pain -Discussed use of Marinol for increasing appetite. The pt is not desiring this but notes she uses a spray-in-mouth CBD spray that does not aid her very much.  -Discussed palliative care service at home. The pt wishes to pursue this option for additional help. The patient tells me that she has not heard from the palliative care team but documentation shows they have called her on several occasions. I will request a palliative care consult here in the hospital to help with symptom management, goals of care, and assistance with arranging outpatient palliative care services.   LOS: 2 days   Mikey Bussing, DNP, AGPCNP-BC, AOCNP 08/08/20   ADDENDUM  .Patient was Personally and independently interviewed, examined and relevant elements of the history of present illness were reviewed in details and an assessment and plan was created. All elements of the patient's history of present illness , assessment and plan were discussed in details with Mikey Bussing, DNP, AGPCNP-BC, . The above documentation reflects our combined findings assessment and plan.   I had a detailed discussion with patient regarding goals of care. She is opening to the idea of palliative care and hospice but still feels that she can eat better and improve her functional status and consider 2nd line palliative chemotherapy with Docetaxel and Ramicirumab. She wants to focus on her diet and work with therapies andf/u in clinic to see if she is functioningwell enough to qualify for additional palliative chemotherapy. Notes pain is better controlled.  Sullivan Lone MD MS

## 2020-08-08 NOTE — Progress Notes (Signed)
Manufacturing engineer [facility]) Community Based Palliative Care       This patient has been referred to our palliative care services in the community.  [facility] will continue to follow for any discharge planning needs and to coordinate admission onto palliative care.    Thank you for the opportunity to participate in this patient's care.     Domenic Moras, BSN, RN [facility] Liaison   [phone] (24h on call)

## 2020-08-08 NOTE — Plan of Care (Signed)
  Problem: Education: Goal: Knowledge of General Education information will improve Description: Including pain rating scale, medication(s)/side effects and non-pharmacologic comfort measures Outcome: Completed/Met

## 2020-08-08 NOTE — Evaluation (Signed)
Physical Therapy Evaluation Patient Details Name: Chelsea Baker MRN: [id number] DOB: 03-21-1946 Today's Date: 08/08/2020   History of Present Illness  "75 year old female with history of non-small cell lung cancer with progressive liver metastasis, not responsive to first-line chemo + immunotherapy, follows with Dr. [name], oncology and waiting on mutational profile to see if there are any other targetable mutations for treatment, presented with 3 days of abdominal pain and initial concern for sepsis.  At this point, infectious etiology felt less likely and abdominal pain likely related to progressive liver mets."  Clinical Impression  Pt admitted with above diagnosis.  Pt currently with functional limitations due to the deficits listed below (see PT Problem List). Pt will benefit from skilled PT to increase their independence and safety with mobility to allow discharge to the venue listed below.  Pt ambulated in hallway and requiring RW today. Pt reports limited endurance and quick to fatigue.  Pt agreeable to HHPT upon d/c.     Follow Up Recommendations Home health PT    Equipment Recommendations  Other (comment) (would likely benefit from rollator (4 wheeled walker with seat) if pt would accept)    Recommendations for Other Services       Precautions / Restrictions Precautions Precautions: Fall      Mobility  Bed Mobility Overal bed mobility: Needs Assistance Bed Mobility: Supine to Sit     Supine to sit: Min assist     General bed mobility comments: assist for trunk upright; pt left sitting EOB per request with bed alarm in place upon therapist leaving room    Transfers Overall transfer level: Needs assistance Equipment used: Rolling walker (2 wheeled) Transfers: Sit to/from Stand Sit to Stand: Min guard         General transfer comment: verbal cues for hand placement  Ambulation/Gait Ambulation/Gait assistance: Min guard Gait Distance (Feet): 100  Feet Assistive device: Rolling walker (2 wheeled) Gait Pattern/deviations: Step-through pattern;Decreased stride length Gait velocity: decr   General Gait Details: pt initially only wanted HHA however requiring more support so agreeable to RW, distance to tolerance, pt with increased work of breathing and reports dyspnea; Spo2 88% on room air upon return to room, improved to 93% with seated rest break  Stairs            Wheelchair Mobility    Modified Rankin (Stroke Patients Only)       Balance Overall balance assessment: Needs assistance         Standing balance support: Bilateral upper extremity supported Standing balance-Leahy Scale: Poor Standing balance comment: reliant on UE support               High Level Balance Comments: pt reports near fall in bathroom with reaching to adjust shower head prior to admission             Pertinent Vitals/Pain Pain Assessment: 0-10 Pain Score: 6  Pain Location: right abdomen Pain Descriptors / Indicators: Aching Pain Intervention(s): Monitored during session;Ice applied    Home Living Family/patient expects to be discharged to:: Private residence Living Arrangements: Children (daughter and her family) Available Help at Discharge: Family;Available 24 hours/day Type of Home: House Home Access: Stairs to enter Entrance Stairs-Rails: Right Entrance Stairs-Number of Steps: 3-4 Home Layout: One level Home Equipment: None      Prior Function Level of Independence: Independent               Hand Dominance        Extremity/Trunk  Assessment        Lower Extremity Assessment Lower Extremity Assessment: Generalized weakness       Communication   Communication: No difficulties  Cognition Arousal/Alertness: Awake/alert Behavior During Therapy: WFL for tasks assessed/performed Overall Cognitive Status: Within Functional Limits for tasks assessed                                         General Comments      Exercises     Assessment/Plan    PT Assessment Patient needs continued PT services  PT Problem List Decreased strength;Decreased mobility;Decreased activity tolerance;Decreased balance;Decreased knowledge of use of DME       PT Treatment Interventions DME instruction;Gait training;Balance training;Therapeutic exercise;Functional mobility training;Patient/family education;Therapeutic activities;Stair training    PT Goals (Current goals can be found in the Care Plan section)  Acute Rehab PT Goals PT Goal Formulation: With patient Time For Goal Achievement: 08/22/20 Potential to Achieve Goals: Good    Frequency Min 3X/week   Barriers to discharge        Co-evaluation               AM-PAC PT "6 Clicks" Mobility  Outcome Measure Help needed turning from your back to your side while in a flat bed without using bedrails?: A Little Help needed moving from lying on your back to sitting on the side of a flat bed without using bedrails?: A Little Help needed moving to and from a bed to a chair (including a wheelchair)?: A Little Help needed standing up from a chair using your arms (e.g., wheelchair or bedside chair)?: A Little Help needed to walk in hospital room?: A Little Help needed climbing 3-5 steps with a railing? : A Little 6 Click Score: 18    End of Session Equipment Utilized During Treatment: Gait belt Activity Tolerance: Patient tolerated treatment well Patient left: in bed;with call bell/phone within reach;with bed alarm set   PT Visit Diagnosis: Difficulty in walking, not elsewhere classified (R26.2);Unsteadiness on feet (R26.81)    Time: 5621-3086 PT Time Calculation (min) (ACUTE ONLY): 21 min   Charges:   PT Evaluation $PT Eval Low Complexity: 1 Low        Kati PT, DPT Acute Rehabilitation Services Pager: [phone] Office: [phone]  Trena Platt 08/08/2020, 3:48 PM

## 2020-08-08 NOTE — Progress Notes (Signed)
Initial Nutrition Assessment  DOCUMENTATION CODES:   Not applicable  INTERVENTION:  - will order Ensure Enlive BID, each supplement provides 350 kcal and 20 grams of protein. - will order 30 ml Prosource Plus BID, each supplement provides 100 kcal and 15 grams protein.  - will order 1 tablet multivitamin with minerals/day. - complete NFPE at follow-up.    NUTRITION DIAGNOSIS:   Increased nutrient needs related to acute illness,cancer and cancer related treatments,chronic illness as evidenced by estimated needs.  GOAL:   Patient will meet greater than or equal to 90% of their needs  MONITOR:   PO intake,Supplement acceptance,Labs,Weight trends  REASON FOR ASSESSMENT:   Malnutrition Screening Tool  ASSESSMENT:   75 year old female with medical history of NSCLC with progressive liver metastasis, not responsive to first-line chemo + immunotherapy. She presented to the ED due to 3 day hx of abdominal pain and initial concern for sepsis.  Regular diet ordered on 2/8 at 1930 and changed to Soft yesterday at 0747. No intakes have been documented since admission.   Patient was on the phone at the time of attempted visit x2.   She has not been seen by a [place] RD at any time in the past.   Weight yesterday was 133 lb and weight on 05/13/20 was 141 lb. This indicates 8 lb weight loss (5.7% body weight) in the past 3 months; not significant for time frame, but no information documented in edema section of flow sheet so unsure if she has fluid onboard.  Per notes: - sepsis has been ruled out - AKI on stage 3 CKD - constipation--bowel regimen ordered - anemia of malignancy - NSCLC with progressive liver mets  - from home with likely plan to return home at the time of d/c - PTA she had been referred to Palliative Care as an outpatient - patient reported that she typically does not eat breakfast   Labs reviewed; Na: 132 mmol/l, Cl: 97 mmol/l, BUN: 39 mg/dl, creatinine: 2.16  mg/dl, Alk Phos elevated, GFR: 23 ml/min.  Medications reviewed; 17 g miralax BID, 2 tablets senokot/day.    NUTRITION - FOCUSED PHYSICAL EXAM:  unable to complete at this time.   Diet Order:   Diet Order            DIET SOFT Room service appropriate? Yes; Fluid consistency: Thin  Diet effective now                 EDUCATION NEEDS:   No education needs have been identified at this time  Skin:  Skin Assessment: Reviewed RN Assessment  Last BM:  2/10 (type 6)  Height:   Ht Readings from Last 1 Encounters:  08/07/20 5' (1.524 m)    Weight:   Wt Readings from Last 1 Encounters:  08/07/20 60.3 kg    Estimated Nutritional Needs:  Kcal:  2050-2250 kcal Protein:  105-115 grams Fluid:  >/= 2 L/day      Jarome Matin, MS, RD, LDN, CNSC Inpatient Clinical Dietitian RD pager # available in [facility]  After hours/weekend pager # available in [facility]

## 2020-08-09 ENCOUNTER — Inpatient Hospital Stay (HOSPITAL_COMMUNITY): Payer: PPO

## 2020-08-09 LAB — COMPREHENSIVE METABOLIC PANEL
ALT: 41 U/L (ref 0–44)
AST: 97 U/L — ABNORMAL HIGH (ref 15–41)
Albumin: 2.6 g/dL — ABNORMAL LOW (ref 3.5–5.0)
Alkaline Phosphatase: 332 U/L — ABNORMAL HIGH (ref 38–126)
Anion gap: 13 (ref 5–15)
BUN: 51 mg/dL — ABNORMAL HIGH (ref 8–23)
CO2: 21 mmol/L — ABNORMAL LOW (ref 22–32)
Calcium: 8.8 mg/dL — ABNORMAL LOW (ref 8.9–10.3)
Chloride: 99 mmol/L (ref 98–111)
Creatinine, Ser: 2.57 mg/dL — ABNORMAL HIGH (ref 0.44–1.00)
GFR, Estimated: 19 mL/min — ABNORMAL LOW (ref >60)
Glucose, Bld: 127 mg/dL — ABNORMAL HIGH (ref 70–99)
Potassium: 5.7 mmol/L — ABNORMAL HIGH (ref 3.5–5.1)
Sodium: 133 mmol/L — ABNORMAL LOW (ref 135–145)
Total Bilirubin: 1.3 mg/dL — ABNORMAL HIGH (ref 0.3–1.2)
Total Protein: 5.9 g/dL — ABNORMAL LOW (ref 6.5–8.1)

## 2020-08-09 LAB — BASIC METABOLIC PANEL
Anion gap: 17 — ABNORMAL HIGH (ref 5–15)
BUN: 55 mg/dL — ABNORMAL HIGH (ref 8–23)
CO2: 19 mmol/L — ABNORMAL LOW (ref 22–32)
Calcium: 8.6 mg/dL — ABNORMAL LOW (ref 8.9–10.3)
Chloride: 99 mmol/L (ref 98–111)
Creatinine, Ser: 2.58 mg/dL — ABNORMAL HIGH (ref 0.44–1.00)
GFR, Estimated: 19 mL/min — ABNORMAL LOW (ref >60)
Glucose, Bld: 120 mg/dL — ABNORMAL HIGH (ref 70–99)
Potassium: 5.1 mmol/L (ref 3.5–5.1)
Sodium: 135 mmol/L (ref 135–145)

## 2020-08-09 MED ORDER — ALUM & MAG HYDROXIDE-SIMETH 200-200-20 MG/5ML PO SUSP
15.0000 mL | Freq: Four times a day (QID) | ORAL | Status: DC | PRN
  Filled 2020-08-09: qty 30

## 2020-08-09 MED ORDER — PANTOPRAZOLE SODIUM 40 MG PO TBEC
40.0000 mg | DELAYED_RELEASE_TABLET | Freq: Every day | ORAL | Status: DC
  Administered 2020-08-09 – 2020-08-14 (×6): 40 mg via ORAL
  Filled 2020-08-09 (×7): qty 1

## 2020-08-09 MED ORDER — SODIUM CHLORIDE 0.9 % IV SOLN: INTRAVENOUS | Status: DC

## 2020-08-09 MED ORDER — SODIUM ZIRCONIUM CYCLOSILICATE 10 G PO PACK
10.0000 g | PACK | Freq: Once | ORAL | Status: AC
  Administered 2020-08-09: 10 g via ORAL
  Filled 2020-08-09: qty 1

## 2020-08-09 NOTE — TOC Initial Note (Signed)
Transition of Care [facility]) - Initial/Assessment Note    Patient Details  Name: Chelsea Baker MRN: [id number] Date of Birth: March 02, 1946  Transition of Care [facility]) CM/SW Contact:    Dessa Phi, RN Phone Number: 08/09/2020, 2:53 PM  Clinical Narrative: Damaris Schooner to dtr Ida Rogue to HHC-HHPT-checking for [facility] agency to accept insurance-dtr has no preference. [employer] for dme-rollator-will need orders-await Palliative care recc.                  Expected Discharge Plan: [place] Barriers to Discharge: Continued Medical Work up   Patient Goals and CMS Choice Patient states their goals for this hospitalization and ongoing recovery are:: go home CMS Medicare.gov Compare Post Acute Care list provided to:: Patient Represenative (must comment) Danae Chen dtr [phone]) Choice offered to / list presented to : Adult Children  Expected Discharge Plan and Services Expected Discharge Plan: [place]   Discharge Planning Services: CM Consult Post Acute Care Choice: [place] arrangements for the past 2 months: Single Family Home                                      Prior Living Arrangements/Services Living arrangements for the past 2 months: Single Family Home   Patient language and need for interpreter reviewed:: Yes Do you feel safe going back to the place where you live?: Yes      Need for Family Participation in Patient Care: No (Comment) Care giver support system in place?: Yes (comment)   Criminal Activity/Legal Involvement Pertinent to Current Situation/Hospitalization: No - Comment as needed  Activities of Daily Living Home Assistive Devices/Equipment: None ADL Screening (condition at time of admission) Patient's cognitive ability adequate to safely complete daily activities?: Yes Is the patient deaf or have difficulty hearing?: Yes Does the patient have difficulty seeing, even when wearing  glasses/contacts?: No Does the patient have difficulty concentrating, remembering, or making decisions?: No Patient able to express need for assistance with ADLs?: Yes Does the patient have difficulty dressing or bathing?: Yes Independently performs ADLs?: No Communication: Independent Dressing (OT): Needs assistance Is this a change from baseline?: Pre-admission baseline Grooming: Independent Feeding: Independent Bathing: Needs assistance Is this a change from baseline?: Pre-admission baseline Toileting: Needs assistance Is this a change from baseline?: Pre-admission baseline In/Out Bed: Needs assistance Is this a change from baseline?: Pre-admission baseline Walks in Home: Needs assistance Is this a change from baseline?: Pre-admission baseline Does the patient have difficulty walking or climbing stairs?: Yes Weakness of Legs: Both Weakness of Arms/Hands: Both  Permission Sought/Granted Permission sought to share information with : Case Manager Permission granted to share information with : Yes, Verbal Permission Granted  Share Information with NAME: Case manager           Emotional Assessment Appearance:: Appears stated age Attitude/Demeanor/Rapport: Gracious Affect (typically observed): Accepting Orientation: : Oriented to Self,Oriented to Place,Oriented to  Time,Oriented to Situation Alcohol / Substance Use: Not Applicable Psych Involvement: No (comment)  Admission diagnosis:  Sepsis ([place]) [A41.9] Sepsis with acute renal failure without septic shock, due to unspecified organism, unspecified acute renal failure type ([place]) [A41.9, R65.20, N17.9] Patient Active Problem List   Diagnosis Date Noted  . Abdominal pain 08/06/2020  . AKI (acute kidney injury) ([place]) 08/06/2020  . CKD (chronic kidney disease), stage III ([place]) 08/06/2020  . On home O2   .  Lung tumor   . Lung cancer ([place])   . Lumbar spondylolysis   . Hypokalemia   . Hiatal hernia   . GERD (gastroesophageal  reflux disease)   . Diverticulosis   . AAA (abdominal aortic aneurysm) ([place])   . Non-small cell lung cancer metastatic to liver ([place]) 05/17/2020  . Counseling regarding advance care planning and goals of care 05/17/2020  . Malignant neoplasm of upper lobe of left lung ([place]) 03/22/2018  . Healthcare maintenance 12/21/2017  . Hyperlipidemia 12/21/2017  . Hypertension 08/25/2017  . Chest discomfort 08/10/2017  . Abnormal echocardiogram 08/10/2017  . Hypoxia   . Dilated cardiomyopathy ([place]) 07/17/2017  . Shortness of breath   . Protein-calorie malnutrition ([place])   . Malignant neoplasm of upper lobe of right lung ([place]) 07/12/2017  . Tachycardia 07/12/2017  . Hyponatremia 07/12/2017  . Cavitating mass in right upper lung lobe 07/12/2017  . AAA (abdominal aortic aneurysm) without rupture ([place]) 04/10/2014  . Aftercare following surgery of the circulatory system 04/10/2014  . Aftercare following surgery of the circulatory system, [place] 11/15/2012   PCP:  Mackie Pai, PA-C Pharmacy:   [id number] - [place], [address] AT [address] [place] [address] Phone: [phone] Fax: [phone]     Social Determinants of Health (SDOH) Interventions    Readmission Risk Interventions No flowsheet data found.

## 2020-08-09 NOTE — Progress Notes (Addendum)
PROGRESS NOTE   RUPA LAGAN  BSW:[id number]    DOB: Jan 04, 1946    DOA: 08/06/2020  PCP: Elise Benne   I have briefly reviewed patients previous medical records in [facility].  Chief Complaint  Patient presents with  . Abdominal Pain    Brief Narrative:  75 year old female with history of non-small cell lung cancer with progressive liver metastasis, not responsive to first-line chemo + immunotherapy, follows with Dr. [name], oncology and waiting on mutational profile to see if there are any other targetable mutations for treatment, presented with 3 days of abdominal pain and initial concern for sepsis.  At this point, infectious etiology felt less likely and abdominal pain likely related to progressive liver mets.  Oncology consulted 2/10.   Assessment & Plan:  Principal Problem:   Abdominal pain Active Problems:   Dilated cardiomyopathy (HCC)   Non-small cell lung cancer metastatic to liver (HCC)   AKI (acute kidney injury) ([place])   CKD (chronic kidney disease), stage III (HCC)   Abdominal pain suspected due to progressive liver mets Likely related to metastatic liver disease.  CT abdomen pelvis without contrast 2/8: Stable appearing hepatic enlargement with underlying hepatic metastatic disease, new mild ascites not enough for safe paracentesis.  I communicated with her primary oncologist on 2/9: Notes as documented above in brief narrative.  He did initiate [place] discussions and palliative care during the last visit.  Supportive treatment of pain and initiate bowel regimen for constipation which may also help.  No UTI symptoms, discontinued antibiotics. Oncology consultation 2/10 appreciated: Reportedly had been started on dexamethasone 4 mg twice daily to help with capsular pain but patient had stopped due to sleep interference.  They have resumed dexamethasone 4 mg daily at noontime and have consulted palliative care for symptom management, goals of care.  Await  palliative input. Abdominal pain better.  SIRS Met criteria on admission.  Initially presumed to have UTI although she did not have any urinary symptoms.  Low index of suspicion for SBP to given small volume of ascites.  Discontinued antibiotics.  Sepsis ruled out.  Acute kidney injury complicating CKD stage IIIa Most likely due to malignancy related poor appetite and poor oral intake.  Creatinine has improved from 2.44-2.15.  Creatinine again worsened from 2.16-2.57 along with hyperkalemia.  Renal ultrasound without acute findings.  Reinitiated IVF.  Renal diet.  Follow BMP in a.m.  Poor overall prognosis.  Hyperkalemia Potassium 5.7.  Provided dose of Lokelma.  Follow BMP later this evening and daily.  Low potassium diet.  Hyponatremia Stable.  Could be a combination of hypovolemia or SIADH related to underlying cancer.  Stable  Constipation Aggressive bowel regimen which may help with her abdominal pain as well.  Patient however has been refusing as per nursing.  Counseled patient regarding importance of regular BMs which would help her pain as well.  Anemia of malignancy and cancer treatment Stable  Non-small cell lung cancer with progressive liver meta stasis Management as noted above.  Mildly abnormal LFTs likely related to this.  Dilated cardiomyopathy, LVEF 46-65%, grade 1 diastolic dysfunction Clinically euvolemic.  Body mass index is 25.96 kg/m.    DVT prophylaxis: heparin injection 5,000 Units Start: 08/06/20 2200     Code Status: DNR Family Communication: I called the patient's daughter via phone, updated care and answered questions.  She was appreciative of the call. Disposition:  Status is: Inpatient  Remains inpatient appropriate because:Inpatient level of care appropriate due to severity of  illness   Dispo: The patient is from: Home              Anticipated d/c is to: Home              Anticipated d/c date is: 2 days              Patient currently is not  medically stable to d/c.   Difficult to place patient No        Consultants:   Medical oncology PMT  Procedures:   None  Antimicrobials:    Anti-infectives (From admission, onward)   Start     Dose/Rate Route Frequency Ordered Stop   08/07/20 1400  cefTRIAXone (ROCEPHIN) 2 g in sodium chloride 0.9 % 100 mL IVPB  Status:  Discontinued        2 g 200 mL/hr over 30 Minutes Intravenous Every 24 hours 08/06/20 1931 08/07/20 0748   08/06/20 1345  cefTRIAXone (ROCEPHIN) 2 g in sodium chloride 0.9 % 100 mL IVPB        2 g 200 mL/hr over 30 Minutes Intravenous  Once 08/06/20 1333 08/06/20 1435   08/06/20 1345  metroNIDAZOLE (FLAGYL) IVPB 500 mg        500 mg 100 mL/hr over 60 Minutes Intravenous  Once 08/06/20 1333 08/06/20 1408        Subjective:  States that her abdominal pain is much better, states 2/10 in severity.  Not happy with restricted diet.  Denies dyspnea.  Objective:   Vitals:   08/08/20 2145 08/09/20 0539 08/09/20 0749 08/09/20 1319  BP: 118/79 116/77  127/83  Pulse: 80 74  82  Resp:  18  (!) 21  Temp:  (!) 97.4 F (36.3 C)  97.6 F (36.4 C)  TempSrc:    Oral  SpO2:  95% 96% 94%  Weight:      Height:        General exam: Elderly female, moderately built, frail and chronically ill looking sitting up in bed.  Appears to be in good spirits. Respiratory system: Occasional right basal crackles.  Rest clear to auscultation.  Mild DOE. Cardiovascular system: S1 & S2 heard, RRR. No JVD, murmurs, rubs, gallops or clicks. No pedal edema.   Gastrointestinal system: Abdomen with epigastric fullness and appreciate ill-defined mass with no tenderness, rigidity, guarding or rebound.  Not pulsatile. No other organomegaly or masses felt. Normal bowel sounds heard.  Today also appreciate?  Right upper quadrant small mass. Central nervous system: Alert and oriented x2. No focal neurological deficits. Extremities: Symmetric 5 x 5 power. Skin: No rashes, lesions or  ulcers Psychiatry: Judgement and insight appear normal. Mood & affect appropriate.     Data Reviewed:   I have personally reviewed following labs and imaging studies   CBC: Recent Labs  Lab 08/06/20 1235 08/07/20 0332 08/08/20 0601  WBC 13.9* 12.9* 13.1*  NEUTROABS 12.4*  --   --   HGB 13.9 10.9* 11.8*  HCT 45.3 36.0 39.2  MCV 90.1 92.1 90.7  PLT 439* 313 903    Basic Metabolic Panel: Recent Labs  Lab 08/07/20 0332 08/08/20 0601 08/09/20 0546  NA 131* 132* 133*  K 5.0 5.1 5.7*  CL 96* 97* 99  CO2 23 20* 21*  GLUCOSE 80 77 127*  BUN 36* 39* 51*  CREATININE 2.15* 2.16* 2.57*  CALCIUM 8.4* 8.9 8.8*    Liver Function Tests: Recent Labs  Lab 08/06/20 1235 08/07/20 0332 08/09/20 0546  AST 93* 85* 97*  ALT 46* 37 41  ALKPHOS 656* 437* 332*  BILITOT 2.2* 1.1 1.3*  PROT 7.6 5.7* 5.9*  ALBUMIN 3.4* 2.5* 2.6*    CBG: No results for input(s): GLUCAP in the last 168 hours.  Microbiology Studies:   Recent Results (from the past 240 hour(s))  Blood culture (routine x 2)     Status: None (Preliminary result)   Collection Time: 08/06/20 12:35 PM   Specimen: BLOOD  Result Value Ref Range Status   Specimen Description   Final    BLOOD RIGHT ANTECUBITAL Performed at [facility], [place] [address] [place], [place] [address]    Special Requests   Final    BOTTLES DRAWN AEROBIC AND ANAEROBIC Blood Culture adequate volume Performed at [place] [address], [place], [place] [address]    Culture   Final    NO GROWTH 3 DAYS Performed at [facility], [place] [address], [place], [place] [address]    Report Status PENDING  Incomplete  Urine culture     Status: Abnormal   Collection Time: 08/06/20  3:35 PM   Specimen: Urine, Random  Result Value Ref Range Status   Specimen Description   Final    URINE, RANDOM Performed at [place] [address], [place], [place] [address]    Special Requests   Final     NONE Performed at [facility], [place] [address] [place], [place] [address]    Culture (A)  Final    <10,000 COLONIES/mL INSIGNIFICANT GROWTH Performed at [place] [address] [place], [place] [address]    Report Status 08/08/2020 FINAL  Final  Resp Panel by RT-PCR (Flu A&B, Covid) Nasopharyngeal Swab     Status: None   Collection Time: 08/06/20  5:28 PM   Specimen: Nasopharyngeal Swab; Nasopharyngeal(NP) swabs in vial transport medium  Result Value Ref Range Status   SARS Coronavirus 2 by RT PCR NEGATIVE NEGATIVE Final    Comment: (NOTE) SARS-CoV-2 target nucleic acids are NOT DETECTED.  The SARS-CoV-2 RNA is generally detectable in upper respiratory specimens during the acute phase of infection. The lowest concentration of SARS-CoV-2 viral copies this assay can detect is 138 copies/mL. A negative result does not preclude SARS-Cov-2 infection and should not be used as the sole basis for treatment or other patient management decisions. A negative result may occur with  improper specimen collection/handling, submission of specimen other than nasopharyngeal swab, presence of viral mutation(s) within the areas targeted by this assay, and inadequate number of viral copies(<138 copies/mL). A negative result must be combined with clinical observations, patient history, and epidemiological information. The expected result is Negative.  Fact Sheet for Patients:  EntrepreneurPulse.com.au  Fact Sheet for Healthcare Providers:  IncredibleEmployment.be  This test is no t yet approved or cleared by the [place] FDA and  has been authorized for detection and/or diagnosis of SARS-CoV-2 by FDA under an Emergency Use Authorization (EUA). This EUA will remain  in effect (meaning this test can be used) for the duration of the COVID-19 declaration under Section 564(b)(1) of the Act, 21 U.S.C.section 360bbb-3(b)(1), unless  the authorization is terminated  or revoked sooner.       Influenza A by PCR NEGATIVE NEGATIVE Final   Influenza B by PCR NEGATIVE NEGATIVE Final    Comment: (NOTE) The Xpert Xpress SARS-CoV-2/FLU/RSV plus assay is intended as an aid in the diagnosis of influenza from Nasopharyngeal swab specimens and should  not be used as a sole basis for treatment. Nasal washings and aspirates are unacceptable for Xpert Xpress SARS-CoV-2/FLU/RSV testing.  Fact Sheet for Patients: EntrepreneurPulse.com.au  Fact Sheet for Healthcare Providers: IncredibleEmployment.be  This test is not yet approved or cleared by the [place] FDA and has been authorized for detection and/or diagnosis of SARS-CoV-2 by FDA under an Emergency Use Authorization (EUA). This EUA will remain in effect (meaning this test can be used) for the duration of the COVID-19 declaration under Section 564(b)(1) of the Act, 21 U.S.C. section 360bbb-3(b)(1), unless the authorization is terminated or revoked.  Performed at [facility], [place] [address] [place], [place] [address]      Radiology Studies:  US RENAL  Result Date: 2020-08-15 CLINICAL DATA:  75 year old female with abdominal pain. Metastatic lung cancer. Renal insufficiency. EXAM: RENAL / URINARY TRACT ULTRASOUND COMPLETE COMPARISON:  Noncontrast CT Abdomen and Pelvis 08/06/2020, and earlier. FINDINGS: Right Kidney: Renal measurements: 8.7 x 3.7 x 3.7 cm = volume: 62 mL. Echogenicity within normal limits. No mass or hydronephrosis visualized. Left Kidney: Renal measurements: 9.3 x 4.7 x 4.8 cm = volume: 109 mL. Echogenicity within normal limits. No mass or hydronephrosis visualized. Bladder: Appears normal for degree of bladder distention. Other: Heterogeneous hepatic echotexture compatible with known liver metastases. IMPRESSION: No acute renal finding. Diminutive but otherwise negative ultrasound appearance of the  kidneys. Electronically Signed   By: Genevie Ann M.D.   On: 08-15-20 07:44     Scheduled Meds:   . (feeding supplement) PROSource Plus  30 mL Oral BID BM  . budesonide  0.5 mg Nebulization BID  . dexamethasone  4 mg Oral Daily  . feeding supplement  237 mL Oral BID BM  . heparin  5,000 Units Subcutaneous Q8H  . metoprolol succinate  50 mg Oral Daily  . multivitamin with minerals  1 tablet Oral Daily  . polyethylene glycol  17 g Oral BID  . senna  2 tablet Oral Daily  . sodium chloride flush  3 mL Intravenous Q12H    Continuous Infusions:   . sodium chloride 50 mL/hr at 15-Aug-2020 0937     LOS: 3 days     Vernell Leep, MD, [place], [facility]. [facility]    To contact the attending provider between 7A-7P or the covering provider during after hours 7P-7A, please log into the web site [email or web address] and access using universal [place] password for that web site. If you do not have the password, please call the hospital operator.  08-15-20, 3:59 PM

## 2020-08-09 NOTE — TOC Progression Note (Signed)
Transition of Care [facility]) - Progression Note    Patient Details  Name: MIRRA BASILIO MRN: [id number] Date of Birth: Aug 24, 1945  Transition of Care [facility] CM/SW Contact  Amogh Komatsu, Juliann Pulse, RN Phone Number: 08/09/2020, 3:11 PM  Clinical Narrative:[employer] rep Tommi Rumps able to accept for HHPT-await orders;[facility] following for rollator-await orders.       Expected Discharge Plan: [place] Barriers to Discharge: Continued Medical Work up  Expected Discharge Plan and Services Expected Discharge Plan: [place]   Discharge Planning Services: CM Consult Post Acute Care Choice: [place] Living arrangements for the past 2 months: [place]: PT [place]: [place] Date [place]: 08/09/20 Time HH Agency Contacted: 1103 Representative spoke with at [place]: [place] ([place]) Interventions    Readmission Risk Interventions Readmission Risk Prevention Plan 08/09/2020  Transportation Screening Complete  PCP or Specialist Appt within 3-5 Days Complete  HRI or Home Care Consult Complete  Palliative Care Screening Complete  Medication Review (RN Care Manager) Complete  Some recent data might be hidden

## 2020-08-09 NOTE — Care Management Important Message (Signed)
Medicare IM printed for Chelsea Baker to give to the patient.

## 2020-08-10 DIAGNOSIS — Z515 Encounter for palliative care: Secondary | ICD-10-CM

## 2020-08-10 LAB — COMPREHENSIVE METABOLIC PANEL
ALT: 44 U/L (ref 0–44)
AST: 104 U/L — ABNORMAL HIGH (ref 15–41)
Albumin: 2.5 g/dL — ABNORMAL LOW (ref 3.5–5.0)
Alkaline Phosphatase: 441 U/L — ABNORMAL HIGH (ref 38–126)
Anion gap: 15 (ref 5–15)
BUN: 59 mg/dL — ABNORMAL HIGH (ref 8–23)
CO2: 20 mmol/L — ABNORMAL LOW (ref 22–32)
Calcium: 8.4 mg/dL — ABNORMAL LOW (ref 8.9–10.3)
Chloride: 100 mmol/L (ref 98–111)
Creatinine, Ser: 2.4 mg/dL — ABNORMAL HIGH (ref 0.44–1.00)
GFR, Estimated: 21 mL/min — ABNORMAL LOW (ref >60)
Glucose, Bld: 127 mg/dL — ABNORMAL HIGH (ref 70–99)
Potassium: 4.8 mmol/L (ref 3.5–5.1)
Sodium: 135 mmol/L (ref 135–145)
Total Bilirubin: 1.3 mg/dL — ABNORMAL HIGH (ref 0.3–1.2)
Total Protein: 5.5 g/dL — ABNORMAL LOW (ref 6.5–8.1)

## 2020-08-10 MED ORDER — SODIUM CHLORIDE 0.9 % IV SOLN: INTRAVENOUS | Status: DC

## 2020-08-10 NOTE — Progress Notes (Signed)
PROGRESS NOTE   Chelsea Baker  TOI:[id number]    DOB: 1946/04/30    DOA: 08/06/2020  PCP: Elise Benne   I have briefly reviewed patients previous medical records in [facility].  Chief Complaint  Patient presents with  . Abdominal Pain    Brief Narrative:  75 year old female with history of non-small cell lung cancer with progressive liver metastasis, not responsive to first-line chemo + immunotherapy, follows with Dr. [name], oncology and waiting on mutational profile to see if there are any other targetable mutations for treatment, presented with 3 days of abdominal pain and initial concern for sepsis.  At this point, infectious etiology felt less likely and abdominal pain likely related to progressive liver mets.  Oncology consulted 2/10.   Assessment & Plan:  Principal Problem:   Abdominal pain Active Problems:   Dilated cardiomyopathy (HCC)   Non-small cell lung cancer metastatic to liver (HCC)   AKI (acute kidney injury) ([place])   CKD (chronic kidney disease), stage III (HCC)   Abdominal pain suspected due to progressive liver mets Likely related to metastatic liver disease.  CT abdomen pelvis without contrast 2/8: Stable appearing hepatic enlargement with underlying hepatic metastatic disease, new mild ascites not enough for safe paracentesis.  I communicated with her primary oncologist on 2/9: Notes as documented above in brief narrative.  He did initiate [place] discussions and palliative care during the last visit.  Supportive treatment of pain and initiate bowel regimen for constipation which may also help.  No UTI symptoms, discontinued antibiotics. Oncology consultation 2/10 appreciated: Reportedly had been started on dexamethasone 4 mg twice daily to help with capsular pain but patient had stopped due to sleep interference.  They have resumed dexamethasone 4 mg daily at noontime and have consulted palliative care for symptom management, goals of care.  Await  palliative input. Abdominal pain has been consistently better for the last 2 days.  SIRS Met criteria on admission.  Initially presumed to have UTI although she did not have any urinary symptoms.  Low index of suspicion for SBP to given small volume of ascites.  Discontinued antibiotics.  Sepsis ruled out.  Acute kidney injury complicating CKD stage IIIa Most likely due to malignancy related poor appetite and poor oral intake.  Creatinine has improved from 2.44-2.15.  Creatinine again worsened on 2/11 from 2.16-2.57 along with hyperkalemia.  Renal ultrasound without acute findings.  Reinitiated IVF.  Renal diet.  Follow BMP in a.m.  Poor overall prognosis. Creatinine is improved to 2.40.  Continue gentle IV fluids for another day and follow BMP in a.m.  Hyperkalemia Potassium 5.7 on 2/11.  Provided dose of Lokelma.  Low potassium diet.  Resolved.  Hyponatremia Could be a combination of hypovolemia or SIADH related to underlying cancer.  Normalized.  Constipation Aggressive bowel regimen which may help with her abdominal pain as well.  Now having BMs yesterday and today.  Anemia of malignancy and cancer treatment Stable  Non-small cell lung cancer with progressive liver meta stasis Management as noted above.  Mildly abnormal LFTs likely related to this.  Dilated cardiomyopathy, LVEF 83-38%, grade 1 diastolic dysfunction Clinically euvolemic.  Body mass index is 25.96 kg/m.    DVT prophylaxis: heparin injection 5,000 Units Start: 08/06/20 2200     Code Status: DNR Family Communication: I called the patient's daughter via phone on 2/11, updated care and answered questions.  She was appreciative of the call. Disposition:  Status is: Inpatient  Remains inpatient appropriate because:Inpatient level  of care appropriate due to severity of illness   Dispo: The patient is from: Home              Anticipated d/c is to: Home              Anticipated d/c date is: 2 days               Patient currently is not medically stable to d/c.   Difficult to place patient No        Consultants:   Medical oncology PMT  Procedures:   None  Antimicrobials:    Anti-infectives (From admission, onward)   Start     Dose/Rate Route Frequency Ordered Stop   08/07/20 1400  cefTRIAXone (ROCEPHIN) 2 g in sodium chloride 0.9 % 100 mL IVPB  Status:  Discontinued        2 g 200 mL/hr over 30 Minutes Intravenous Every 24 hours 08/06/20 1931 08/07/20 0748   08/06/20 1345  cefTRIAXone (ROCEPHIN) 2 g in sodium chloride 0.9 % 100 mL IVPB        2 g 200 mL/hr over 30 Minutes Intravenous  Once 08/06/20 1333 08/06/20 1435   08/06/20 1345  metroNIDAZOLE (FLAGYL) IVPB 500 mg        500 mg 100 mL/hr over 60 Minutes Intravenous  Once 08/06/20 1333 08/06/20 1408        Subjective:  Patient sleeping this morning but arousable.  States that abdominal pain is better.  Had a small BM this morning.  Also said that she slept well last night.  Per nursing, no acute issues.  Objective:   Vitals:   08/09/20 2247 08/10/20 0502 08/10/20 0822 08/10/20 1352  BP: 125/74 130/82  124/77  Pulse: 84 83  92  Resp: (!) 24 (!) 24  (!) 22  Temp: 97.7 F (36.5 C) 97.7 F (36.5 C)  (!) 97.4 F (36.3 C)  TempSrc: Oral Oral  Axillary  SpO2: 96% 96% 97% 97%  Weight:      Height:        General exam: Elderly female, moderately built, frail and chronically ill sleeping comfortably supine in bed. Respiratory system: Clear to auscultation.  No wheezing, rhonchi or crackles.  No increased work of breathing. Cardiovascular system: S1 & S2 heard, RRR. No JVD, murmurs, rubs, gallops or clicks. No pedal edema.   Gastrointestinal system: Abdomen with epigastric fullness and appreciate ill-defined mass with no tenderness, rigidity, guarding or rebound.  Not pulsatile. No other organomegaly or masses felt. Normal bowel sounds heard.  Today also appreciate?  Right upper quadrant small mass. Central nervous system:  Sleeping but easily aroused and coherent.  No focal deficits. Extremities: Symmetric 5 x 5 power. Skin: No rashes, lesions or ulcers Psychiatry: Judgement and insight appear normal. Mood & affect appropriate.     Data Reviewed:   I have personally reviewed following labs and imaging studies   CBC: Recent Labs  Lab 08/06/20 1235 08/07/20 0332 08/08/20 0601  WBC 13.9* 12.9* 13.1*  NEUTROABS 12.4*  --   --   HGB 13.9 10.9* 11.8*  HCT 45.3 36.0 39.2  MCV 90.1 92.1 90.7  PLT 439* 313 993    Basic Metabolic Panel: Recent Labs  Lab 08/09/20 0546 08/09/20 1629 08/10/20 0607  NA 133* 135 135  K 5.7* 5.1 4.8  CL 99 99 100  CO2 21* 19* 20*  GLUCOSE 127* 120* 127*  BUN 51* 55* 59*  CREATININE 2.57* 2.58* 2.40*  CALCIUM  8.8* 8.6* 8.4*    Liver Function Tests: Recent Labs  Lab 08/07/20 0332 08/09/20 0546 08/10/20 0607  AST 85* 97* 104*  ALT 37 41 44  ALKPHOS 437* 332* 441*  BILITOT 1.1 1.3* 1.3*  PROT 5.7* 5.9* 5.5*  ALBUMIN 2.5* 2.6* 2.5*    CBG: No results for input(s): GLUCAP in the last 168 hours.  Microbiology Studies:   Recent Results (from the past 240 hour(s))  Blood culture (routine x 2)     Status: None (Preliminary result)   Collection Time: 08/06/20 12:35 PM   Specimen: BLOOD  Result Value Ref Range Status   Specimen Description   Final    BLOOD RIGHT ANTECUBITAL Performed at [facility], [place] [address] [place], [place] [address]    Special Requests   Final    BOTTLES DRAWN AEROBIC AND ANAEROBIC Blood Culture adequate volume Performed at [facility] [address], [place], [place] [address]    Culture   Final    NO GROWTH 3 DAYS Performed at [facility], [place] [address] [place], [place] [address]    Report Status PENDING  Incomplete  Urine culture     Status: Abnormal   Collection Time: 08/06/20  3:35 PM   Specimen: Urine, Random  Result Value Ref Range Status   Specimen Description   Final    URINE,  RANDOM Performed at [facility] [address] [place], [place] [address]    Special Requests   Final    NONE Performed at [facility], [place] [address], [place], [place] [address]    Culture (A)  Final    <10,000 COLONIES/mL INSIGNIFICANT GROWTH Performed at [facility] [address], [employer] [place], [place] [address]    Report Status 08/08/2020 FINAL  Final  Resp Panel by RT-PCR (Flu A&B, Covid) Nasopharyngeal Swab     Status: None   Collection Time: 08/06/20  5:28 PM   Specimen: Nasopharyngeal Swab; Nasopharyngeal(NP) swabs in vial transport medium  Result Value Ref Range Status   SARS Coronavirus 2 by RT PCR NEGATIVE NEGATIVE Final    Comment: (NOTE) SARS-CoV-2 target nucleic acids are NOT DETECTED.  The SARS-CoV-2 RNA is generally detectable in upper respiratory specimens during the acute phase of infection. The lowest concentration of SARS-CoV-2 viral copies this assay can detect is 138 copies/mL. A negative result does not preclude SARS-Cov-2 infection and should not be used as the sole basis for treatment or other patient management decisions. A negative result may occur with  improper specimen collection/handling, submission of specimen other than nasopharyngeal swab, presence of viral mutation(s) within the areas targeted by this assay, and inadequate number of viral copies(<138 copies/mL). A negative result must be combined with clinical observations, patient history, and epidemiological information. The expected result is Negative.  Fact Sheet for Patients:  EntrepreneurPulse.com.au  Fact Sheet for Healthcare Providers:  IncredibleEmployment.be  This test is no t yet approved or cleared by the [place] FDA and  has been authorized for detection and/or diagnosis of SARS-CoV-2 by FDA under an Emergency Use Authorization (EUA). This EUA will remain  in effect (meaning this test can be  used) for the duration of the COVID-19 declaration under Section 564(b)(1) of the Act, 21 U.S.C.section 360bbb-3(b)(1), unless the authorization is terminated  or revoked sooner.       Influenza A by PCR NEGATIVE NEGATIVE Final   Influenza B by PCR NEGATIVE NEGATIVE Final  Comment: (NOTE) The Xpert Xpress SARS-CoV-2/FLU/RSV plus assay is intended as an aid in the diagnosis of influenza from Nasopharyngeal swab specimens and should not be used as a sole basis for treatment. Nasal washings and aspirates are unacceptable for Xpert Xpress SARS-CoV-2/FLU/RSV testing.  Fact Sheet for Patients: EntrepreneurPulse.com.au  Fact Sheet for Healthcare Providers: IncredibleEmployment.be  This test is not yet approved or cleared by the [place] FDA and has been authorized for detection and/or diagnosis of SARS-CoV-2 by FDA under an Emergency Use Authorization (EUA). This EUA will remain in effect (meaning this test can be used) for the duration of the COVID-19 declaration under Section 564(b)(1) of the Act, 21 U.S.C. section 360bbb-3(b)(1), unless the authorization is terminated or revoked.  Performed at [facility], [place] [address], [place], [place] [address]      Radiology Studies:  US RENAL  Result Date: 09/04/20 CLINICAL DATA:  75 year old female with abdominal pain. Metastatic lung cancer. Renal insufficiency. EXAM: RENAL / URINARY TRACT ULTRASOUND COMPLETE COMPARISON:  Noncontrast CT Abdomen and Pelvis 08/06/2020, and earlier. FINDINGS: Right Kidney: Renal measurements: 8.7 x 3.7 x 3.7 cm = volume: 62 mL. Echogenicity within normal limits. No mass or hydronephrosis visualized. Left Kidney: Renal measurements: 9.3 x 4.7 x 4.8 cm = volume: 109 mL. Echogenicity within normal limits. No mass or hydronephrosis visualized. Bladder: Appears normal for degree of bladder distention. Other: Heterogeneous hepatic echotexture compatible  with known liver metastases. IMPRESSION: No acute renal finding. Diminutive but otherwise negative ultrasound appearance of the kidneys. Electronically Signed   By: Genevie Ann M.D.   On: 09/04/20 07:44   DG CHEST PORT 1 VIEW  Result Date: 2020/09/04 CLINICAL DATA:  Shortness of breath EXAM: PORTABLE CHEST 1 VIEW COMPARISON:  08/06/2020 FINDINGS: Ill-defined opacities are again identified in the upper right lung and mid left lung with architectural distortion previously characterized on chest CT. Unchanged ill-defined density at the left lung base likely reflecting nipple shadow. Stable cardiomediastinal contours. No pleural effusion or pneumothorax. IMPRESSION: No new finding since 08/06/2020. Electronically Signed   By: Macy Mis M.D.   On: 09/04/2020 16:55     Scheduled Meds:   . (feeding supplement) PROSource Plus  30 mL Oral BID BM  . budesonide  0.5 mg Nebulization BID  . dexamethasone  4 mg Oral Daily  . feeding supplement  237 mL Oral BID BM  . heparin  5,000 Units Subcutaneous Q8H  . metoprolol succinate  50 mg Oral Daily  . multivitamin with minerals  1 tablet Oral Daily  . pantoprazole  40 mg Oral Daily  . polyethylene glycol  17 g Oral BID  . senna  2 tablet Oral Daily  . sodium chloride flush  3 mL Intravenous Q12H    Continuous Infusions:   . sodium chloride 50 mL/hr at 08/10/20 0944     LOS: 4 days     Vernell Leep, MD, [place], [facility]. [facility]    To contact the attending provider between 7A-7P or the covering provider during after hours 7P-7A, please log into the web site [email or web address] and access using universal [place] password for that web site. If you do not have the password, please call the hospital operator.  08/10/2020, 4:18 PM

## 2020-08-10 NOTE — Consult Note (Signed)
Consultation Note Date: 08/10/2020   Patient Name: Chelsea Baker  DOB: [birth year]  MRN: [id number]  Age / Sex: 75 y.o., female  PCP: Elise Benne Referring Physician: Modena Jansky, MD  Reason for Consultation: Establishing goals of care  HPI/Patient Profile: 75 y.o. female  with past medical history of NSCLC with progressive liver mets admitted on 08/06/2020 with worsened abdominal pain.  Initial concern for infection, but infectious etiology less likely and increased pain likely due to metastatic disease.  Palliative consulted for assistance with pain management.   Clinical Assessment and Goals of Care: I met today with Chelsea Baker.   She reports that her pain has improved today.  Reports that pain was sharp, located in RUQ with radiation to left, worse with certain movements and positions, and somewhat better with pain medications.  Discussed reinitiation of steroids and that timing of this seems to coincide with improvement in pain.  Discussed constipation and decreased appetite.  She feels both of these have improved today.  Reiterated importance of bowel regimen.  We also discussed clinical course as well as wishes moving forward in regard to advanced directives.  Concepts specific to code status and rehospitalization discussed.  We discussed differences between a aggressive medical intervention path and a palliative, comfort focused care path.  Values and goals of care important to patient and family were attempted to be elicited.  Concept of Hospice and Palliative Care were discussed  Questions and concerns addressed.   PMT will continue to support holistically.  SUMMARY OF RECOMMENDATIONS   - DNR/DNI - Pain: Improved per her report.  In discussing with her, it seems pain likely has capsular component for liver mets.  Improvement is probably largely driven by restarting steroids.   Would recommend she continue these as well as continuing as needed oxycodone (she feels this is more effective that tramadol) for breakthrough pain.  She also reports aching in her breast.  Reports that at home she uses ace wrap to support herself.  I asked staff to assist her in placing wrap as she desires. - She is invested in plan to continue current interventions while awaiting return of molecular testing. - She reports that she would like her her daughter to make decisions on her behalf in the event she could not make them for herself.  We discussed consideration for completion of HCPOA paperwork.  Discussed that her children would share decision making if she does designate one of them as her surrogate.  She feels they would work together on this but will consider if she would like to complete advance directives. - Introduced concept of MOST form and how completion can ensure her care desires are documented.    Code Status/Advance Care Planning:  DNR   Symptom Management:  As above  Palliative Prophylaxis:   Frequent Pain Assessment  Psycho-social/Spiritual:   Desire for further Chaplaincy support:Did not address today  Additional Recommendations: Caregiving  Support/Resources  Prognosis:   Unable to determine  Discharge Planning: To Be Determined  Primary Diagnoses: Present on Admission: . (Resolved) Sepsis ([place]) . Dilated cardiomyopathy ([place]) . Non-small cell lung cancer metastatic to liver [facility])   I have reviewed the medical record, interviewed the patient and family, and examined the patient. The following aspects are pertinent.  Past Medical History:  Diagnosis Date  . AAA (abdominal aortic aneurysm) ([place])   . AAA (abdominal aortic aneurysm) without rupture ([place]) 04/10/2014  . Abnormal echocardiogram 08/10/2017  . Aftercare following surgery of the circulatory system 04/10/2014  . Aftercare following surgery of the circulatory system, [place] 11/15/2012  .  Cavitating mass in right upper lung lobe 07/12/2017  . Chest discomfort 08/10/2017  . Counseling regarding advance care planning and goals of care 05/17/2020  . Dilated cardiomyopathy ([place]) 07/17/2017   Echocardiogram July 17, 2017: EF 35-40%.  Septal mid and basal inferior wall hypokinesis.  Mildly dilated LV.  Mildly reduced EF.  GR 1 DD.  Calcified mitral annulus.    . Diverticulosis   . GERD (gastroesophageal reflux disease)   . Healthcare maintenance 12/21/2017  . Hiatal hernia   . Hyperlipidemia 12/21/2017  . Hypertension   . Hypokalemia   . Hyponatremia 07/12/2017  . Hypoxia   . Lumbar spondylolysis   . Lung cancer ([place]) dx'd 2019  . Lung tumor   . Malignant neoplasm of upper lobe of left lung ([place]) 03/22/2018  . Malignant neoplasm of upper lobe of right lung ([place]) 07/12/2017  . Non-small cell lung cancer metastatic to liver ([place]) 05/17/2020  . Protein-calorie malnutrition ([place])   . Shortness of breath   . Tachycardia 07/12/2017   Social History   Socioeconomic History  . Marital status: Single    Spouse name: Not on file  . Number of children: Not on file  . Years of education: Not on file  . Highest education level: Not on file  Occupational History  . Not on file  Tobacco Use  . Smoking status: Former Smoker    Packs/day: 1.00    Years: 50.00    Pack years: 50.00    Types: Cigarettes    Quit date: 06/2017    Years since quitting: 3.1  . Smokeless tobacco: Never Used  Vaping Use  . Vaping Use: Never used  Substance and Sexual Activity  . Alcohol use: Yes    Comment: rarely  . Drug use: No  . Sexual activity: Not Currently  Other Topics Concern  . Not on file  Social History Narrative  . Not on file   Social Determinants of Health   Financial Resource Strain: Not on file  Food Insecurity: Not on file  Transportation Needs: Not on file  Physical Activity: Not on file  Stress: Not on file  Social Connections: Not on file   Family History  Problem  Relation Age of Onset  . Heart failure Mother   . Heart attack Mother 60       In 43s - 1st MI  . Coronary artery disease Mother   . Coronary artery disease Father        Died at age 69  . Heart attack Father 73  . Heart disease Maternal Grandmother   . Heart disease Maternal Grandfather   . Cancer Neg Hx    Scheduled Meds: . (feeding supplement) PROSource Plus  30 mL Oral BID BM  . budesonide  0.5 mg Nebulization BID  . dexamethasone  4 mg Oral Daily  . feeding supplement  237 mL Oral BID BM  . heparin  5,000  Units Subcutaneous Q8H  . metoprolol succinate  50 mg Oral Daily  . multivitamin with minerals  1 tablet Oral Daily  . pantoprazole  40 mg Oral Daily  . polyethylene glycol  17 g Oral BID  . senna  2 tablet Oral Daily  . sodium chloride flush  3 mL Intravenous Q12H   Continuous Infusions: . sodium chloride 50 mL/hr at 08/10/20 0944   PRN Meds:.albuterol, bisacodyl, HYDROmorphone (DILAUDID) injection, LORazepam, ondansetron **OR** ondansetron (ZOFRAN) IV, oxyCODONE, prochlorperazine, sodium chloride, traMADol Medications Prior to Admission:  Prior to Admission medications   Medication Sig Start Date End Date Taking? Authorizing Provider  acetaminophen (TYLENOL) 500 MG tablet Take 500 mg by mouth every 6 (six) hours as needed for moderate pain.   Yes [provider]  albuterol (VENTOLIN HFA) 108 (90 Base) MCG/ACT inhaler INHALE 2 PUFFS INTO THE LUNGS EVERY 4 (FOUR) HOURS AS NEEDED FOR WHEEZING OR SHORTNESS OF BREATH. 10/09/19  Yes Olalere, Adewale A, MD  Baclofen 5 MG TABS Take 1 tablet by mouth 2 (two) times daily as needed. Patient taking differently: Take 1 tablet by mouth 2 (two) times daily as needed (musle spasm). 01/29/20  Yes Abonza, Maritza, PA-C  budesonide (PULMICORT) 0.5 MG/2ML nebulizer solution Take 2 mLs (0.5 mg total) by nebulization 2 (two) times daily. Patient taking differently: Take 0.5 mg by nebulization daily as needed (wheezing). 01/12/19  Yes  Olalere, Adewale A, MD  dexamethasone (DECADRON) 4 MG tablet Take 1 tablet (4 mg total) by mouth 2 (two) times daily with breakfast and lunch. Start the day after carboplatin chemotherapy for 3 days. Patient taking differently: Take 4 mg by mouth daily as needed (inflammation). 07/29/20  Yes [name], MD  LORazepam (ATIVAN) 0.5 MG tablet Take 1 tablet (0.5 mg total) by mouth every 6 (six) hours as needed (Nausea or vomiting). Patient taking differently: Take 0.5 mg by mouth every 6 (six) hours as needed for anxiety. 05/17/20  Yes [name], MD  metoprolol succinate (TOPROL-XL) 50 MG 24 hr tablet 1 tab po q day Patient taking differently: Take 50 mg by mouth daily. 07/10/20  Yes Saguier, Percell Miller, PA-C  ondansetron (ZOFRAN) 8 MG tablet Take 1 tablet (8 mg total) by mouth 2 (two) times daily as needed for refractory nausea / vomiting. Start on day 3 after carboplatin chemo. Patient taking differently: Take 8 mg by mouth 2 (two) times daily as needed for refractory nausea / vomiting. 05/17/20  Yes [name], MD  prochlorperazine (COMPAZINE) 10 MG tablet Take 1 tablet (10 mg total) by mouth every 6 (six) hours as needed (Nausea or vomiting). Patient taking differently: Take 10 mg by mouth every 6 (six) hours as needed for refractory nausea / vomiting. 05/17/20  Yes [name], MD  sodium chloride (OCEAN) 0.65 % SOLN nasal spray Place 1 spray into both nostrils daily as needed for congestion.   Yes [provider]  Tetrahydrozoline HCl (VISINE EXTRA OP) Place 1 drop into both eyes daily as needed (dry eyes).   Yes [provider]  traMADol (ULTRAM) 50 MG tablet Take 1 tablet (50 mg total) by mouth every 6 (six) hours as needed. Patient taking differently: Take 50 mg by mouth every 6 (six) hours as needed for moderate pain. 07/29/20  Yes [name], MD  acyclovir (ZOVIRAX) 400 MG tablet Take 1 tablet (400 mg total) by mouth 2 (two) times  daily. Patient not taking: No sig reported 07/05/20   Sullivan Lone  Vivien Rossetti, MD  fexofenadine (ALLEGRA) 180 MG tablet Take 1 tablet (180 mg total) by mouth daily. Patient not taking: No sig reported 03/18/20   [name], MD  ipratropium-albuterol (DUONEB) 0.5-2.5 (3) MG/3ML SOLN Take 3 mLs by nebulization every 4 (four) hours as needed. Patient not taking: No sig reported 12/19/18   Laurin Coder, MD   No Known Allergies Review of Systems  Physical Exam  General: Alert, awake, in no acute distress. Frail and chronically ill appearing  HEENT: No bruits, no goiter, no JVD Heart: Regular rate and rhythm. No murmur appreciated. Lungs: Good air movement, clear Abdomen: Soft, nontender, nondistended, positive bowel sounds.  Ext: No significant edema Skin: Warm and dry Neuro: Grossly intact, nonfocal.   Vital Signs: BP 121/70 (BP Location: Left Arm)   Pulse 87   Temp (!) 97.4 F (36.3 C) (Oral)   Resp 20   Ht 5' (1.524 m)   Wt 60.3 kg   SpO2 96%   BMI 25.96 kg/m  Pain Scale: 0-10   Pain Score: 0-No pain   SpO2: SpO2: 96 % O2 Device:SpO2: 96 % O2 Flow Rate: .O2 Flow Rate (L/min): 1 L/min  IO: Intake/output summary:   Intake/Output Summary (Last 24 hours) at 08/10/2020 2311 Last data filed at 08/10/2020 1559 Gross per 24 hour  Intake 1510.89 ml  Output -  Net 1510.89 ml    LBM: Last BM Date: 08/09/20 Baseline Weight: Weight: 60.3 kg Most recent weight: Weight: 60.3 kg     Palliative Assessment/Data:   Flowsheet Rows   Flowsheet Row Most Recent Value  Intake Tab   Referral Department Hospitalist  Unit at Time of Referral Med/Surg Unit  Clinical Assessment   Psychosocial & Spiritual Assessment   Palliative Care Outcomes       Time In: 1700 Time Out: 1540 Time Total: 80 Greater than 50%  of this time was spent counseling and coordinating care related to the above assessment and plan.  Signed by: Micheline Rough, MD   Please contact Palliative  Medicine Team phone at [phone] for questions and concerns.  For individual provider: See Shea Evans

## 2020-08-11 ENCOUNTER — Inpatient Hospital Stay (HOSPITAL_COMMUNITY): Payer: PPO

## 2020-08-11 DIAGNOSIS — C787 Secondary malignant neoplasm of liver and intrahepatic bile duct: Secondary | ICD-10-CM

## 2020-08-11 DIAGNOSIS — R109 Unspecified abdominal pain: Secondary | ICD-10-CM

## 2020-08-11 LAB — CULTURE, BLOOD (ROUTINE X 2)
Culture: NO GROWTH
Special Requests: ADEQUATE

## 2020-08-11 LAB — BASIC METABOLIC PANEL
Anion gap: 11 (ref 5–15)
BUN: 51 mg/dL — ABNORMAL HIGH (ref 8–23)
CO2: 21 mmol/L — ABNORMAL LOW (ref 22–32)
Calcium: 8.4 mg/dL — ABNORMAL LOW (ref 8.9–10.3)
Chloride: 103 mmol/L (ref 98–111)
Creatinine, Ser: 1.96 mg/dL — ABNORMAL HIGH (ref 0.44–1.00)
GFR, Estimated: 26 mL/min — ABNORMAL LOW (ref >60)
Glucose, Bld: 119 mg/dL — ABNORMAL HIGH (ref 70–99)
Potassium: 4.7 mmol/L (ref 3.5–5.1)
Sodium: 135 mmol/L (ref 135–145)

## 2020-08-11 MED ORDER — ENSURE ENLIVE PO LIQD: 237.0000 mL | Freq: Two times a day (BID) | ORAL | 12 refills | Status: AC

## 2020-08-11 MED ORDER — PROSOURCE PLUS PO LIQD: 30.0000 mL | Freq: Two times a day (BID) | ORAL | 0 refills | Status: AC

## 2020-08-11 MED ORDER — LORAZEPAM 0.5 MG PO TABS: 0.5000 mg | ORAL_TABLET | Freq: Four times a day (QID) | ORAL | Status: AC | PRN

## 2020-08-11 MED ORDER — PANTOPRAZOLE SODIUM 40 MG PO TBEC: 40.0000 mg | DELAYED_RELEASE_TABLET | Freq: Every day | ORAL | 0 refills | Status: AC

## 2020-08-11 MED ORDER — FLEET ENEMA 7-19 GM/118ML RE ENEM: 1.0000 | ENEMA | Freq: Every day | RECTAL | Status: DC | PRN

## 2020-08-11 MED ORDER — SENNA 8.6 MG PO TABS: 2.0000 | ORAL_TABLET | Freq: Every day | ORAL | 0 refills | Status: AC

## 2020-08-11 MED ORDER — POLYETHYLENE GLYCOL 3350 17 G PO PACK: 17.0000 g | PACK | Freq: Two times a day (BID) | ORAL | 0 refills | Status: AC

## 2020-08-11 MED ORDER — ADULT MULTIVITAMIN W/MINERALS CH: 1.0000 | ORAL_TABLET | Freq: Every day | ORAL | Status: AC

## 2020-08-11 MED ORDER — DEXAMETHASONE 4 MG PO TABS: 4.0000 mg | ORAL_TABLET | Freq: Every day | ORAL | 0 refills | Status: AC

## 2020-08-11 NOTE — Discharge Instructions (Signed)

## 2020-08-11 NOTE — TOC Progression Note (Signed)
Transition of Care [facility]) - Progression Note    Patient Details  Name: Chelsea Baker MRN: [id number] Date of Birth: 24-Jun-1946  Transition of Care [facility]) CM/SW Contact  Joaquin Courts, RN Phone Number: 08/11/2020, 3:00 PM  Clinical Narrative:    Adapt to deliver rollator to bedside for home use.   Expected Discharge Plan: [place] Barriers to Discharge: Continued Medical Work up  Expected Discharge Plan and Services Expected Discharge Plan: [place]   Discharge Planning Services: CM Consult Post Acute Care Choice: [place] arrangements for the past 2 months: Single Family Home Expected Discharge Date: 08/11/20                         HH Arranged: PT [place]: [place] Date [place]: 08/09/20 Time [place]: [id number] Representative spoke with at [place]: [place] ([place]) Interventions    Readmission Risk Interventions Readmission Risk Prevention Plan 08/09/2020  Transportation Screening Complete  PCP or Specialist Appt within 3-5 Days Complete  HRI or Home Care Consult Complete  Palliative Care Screening Complete  Medication Review (RN Care Manager) Complete  Some recent data might be hidden

## 2020-08-11 NOTE — Progress Notes (Addendum)
Addendum  Patient had been discharged.  Daughter came to pick patient up but then now patient stated that she had significant uncontrolled pain and nausea.  Reportedly started after she ambulated the hall with RN.  As per discussion with RN, does not really look in distress.  Some concern regarding anxiety going home because this morning she had said that she prefers to be in the hospital rather than go home.     Arrived at bedside.   Says pain started within the hour. Had small BM with hard stools. Feels gaseous.   Patient appears uncomfortable. Abd: Obese/distended, but doesn't seem any worse than in prior few days, diffuse mild tenderness> however appears inconsistent and changing locations. ? Guarding. Normal bowel sounds heard.  A/P  Chronic Ca related pain, ? Constipation, r/o ileus.  Check KUB stat. Discussed in detail with daughter and RN at bedside.  We will cancel discharge for tonight, monitor overnight and reassess in a.m.  Daughter agreeable  Vernell Leep, MD, [place], [facility]  To contact the attending provider between 7A-7P or the covering provider during after hours 7P-7A, please log into the web site [email or web address] and access using universal [place] password for that web site. If you do not have the password, please call the hospital operator.

## 2020-08-11 NOTE — Progress Notes (Signed)
Provided and discussed discharge instructions. Addressed all questions and concerns. Patient verbalized understanding, patient removed IV herself.  Jerene Pitch

## 2020-08-11 NOTE — Discharge Summary (Signed)
Physician Discharge Summary  DELMAR DONDERO [facility]:[id number] DOB: 12/15/1945  PCP: Mackie Pai, PA-C  Admitted from: Home Discharged to: Home  Admit date: 08/06/2020 Discharge date: 08/11/2020  Recommendations for Outpatient Follow-up:    Follow-up Information    Saguier, Iris Pert. Schedule an appointment as soon as possible for a visit in 1 week(s).   Specialties: Internal Medicine, Family Medicine Why: To be seen with repeat labs (CBC & CMP). Contact information: [address] [place] [address] [phone]        [name], MD. Schedule an appointment as soon as possible for a visit in 1 week(s).   Specialties: Hematology, Oncology Contact information: [place] [address] [place] Orders (From admission, onward)    Start     Ordered   08/11/20 [place]  At discharge       Question Answer Comment  To provide the following care/treatments PT   To provide the following care/treatments OT      08/11/20 1326           Equipment/Devices: 4 wheeled walker with seat    Discharge Condition: Improved and stable   Code Status: DNR Diet recommendation:  Discharge Diet Orders (From admission, onward)    Start     Ordered   08/11/20 0000  Diet - low sodium heart healthy        08/11/20 1432           Discharge Diagnoses:  Principal Problem:   Abdominal pain Active Problems:   Dilated cardiomyopathy ([place])   Non-small cell lung cancer metastatic to liver ([place])   AKI (acute kidney injury) ([place])   CKD (chronic kidney disease), stage III ([place])   Liver metastases ([place])   Brief Summary: 75 year old female with history of non-small cell lung cancer with progressive liver metastasis, not responsive to first-line chemo + immunotherapy, follows with Dr. [name], oncology and waiting on mutational profile to see if there are any other targetable  mutations for treatment, presented with 3 days of abdominal pain and initial concern for sepsis.  At this point, infectious etiology felt less likely and abdominal pain likely related to progressive liver mets.  Oncology consulted 2/10.   Assessment & Plan:   Abdominal pain suspected due to progressive liver mets CT abdomen pelvis without contrast 2/8: Stable appearing hepatic enlargement with underlying hepatic metastatic disease, new mild ascites not enough for safe paracentesis.  I communicated with her primary oncologist on 2/9: Notes as documented above in brief narrative.  He did initiate [place] discussions and palliative care during the last visit.  Supportive treatment of pain and initiated bowel regimen for constipation which may also help.  No UTI symptoms, discontinued antibiotics. Oncology consultation 2/10 appreciated: Reportedly had been started on dexamethasone 4 mg twice daily to help with capsular pain but patient had stopped due to sleep interference.  They have resumed dexamethasone 4 mg daily at noontime and have consulted palliative care for symptom management, goals of care.   Abdominal pain has consistently improved for the last 3 days where she has reported pain is 2/10 in severity.  She has barely used any pain medicines except her dose of oxycodone this morning.  Reports to be eating okay but does not like the hospital food.  States that she had a large BM today. Palliative  care consultation 2/12 appreciated: DNR/DNI has been established.  Agree that her pain improvement is probably largely driven by restarting steroids.  Continue steroids at discharge and patient has been counseled to be compliant with these and this was discussed with patient's daughter as well.  Since she has not used much of the opioid pain medicines, left her on her prior home tramadol for breakthrough pain and this can be followed up at the cancer center with her oncologist.  Recommend continued [place] and  palliative care conversations during follow-up with her oncologist. Improved and stable.  SIRS Met criteria on admission.  Initially presumed to have UTI although she did not have any urinary symptoms.  Low index of suspicion for SBP to given small volume of ascites.  Discontinued antibiotics.  Sepsis ruled out.  Acute kidney injury complicating CKD stage IIIa Most likely due to malignancy related poor appetite and poor oral intake.    Last creatinine PTA on 07/26/2020: 1.96.  Presented with creatinine of 2.44.  Despite IV hydration, this peaked to 2.58.  Renal ultrasound was thereby obtained and unremarkable.  IV fluids were continued.  Creatinine has finally improved to 1.96.  Recommend continued ad lib. oral hydration at discharge and avoid nephrotoxic's.  Follow BMP as outpatient at the cancer center.  Hyperkalemia Potassium 5.7 on 2/11.  Provided dose of Lokelma.  Low potassium diet.  Resolved.  Monitor BMP as outpatient.  Hyponatremia Could be a combination of hypovolemia or SIADH related to underlying cancer.  Normalized.  Constipation Aggressive bowel regimen which may help with her abdominal pain as well.  Now having BMs yesterday and today.  Continue MiraLAX and senna and patient counseled regarding titration of these medications as needed if she develops diarrhea  Anemia of malignancy and cancer treatment Stable  Non-small cell lung cancer with progressive liver meta stasis Management as noted above.  Mildly abnormal LFTs likely related to this.  Dilated cardiomyopathy, LVEF 62-26%, grade 1 diastolic dysfunction Clinically euvolemic.  Body mass index is 25.96 kg/m.      Consultants:   Medical oncology PMT  Procedures:   None   Discharge Instructions  Discharge Instructions    Call MD for:  difficulty breathing, headache or visual disturbances   Complete by: As directed    Call MD for:  extreme fatigue   Complete by: As directed    Call MD for:   persistant dizziness or light-headedness   Complete by: As directed    Call MD for:  persistant nausea and vomiting   Complete by: As directed    Call MD for:  severe uncontrolled pain   Complete by: As directed    Call MD for:  temperature >100.4   Complete by: As directed    Diet - low sodium heart healthy   Complete by: As directed    Increase activity slowly   Complete by: As directed        Medication List    STOP taking these medications   acyclovir 400 MG tablet Commonly known as: ZOVIRAX   fexofenadine 180 MG tablet Commonly known as: ALLEGRA   ipratropium-albuterol 0.5-2.5 (3) MG/3ML Soln Commonly known as: DUONEB     TAKE these medications   (feeding supplement) PROSource Plus liquid Take 30 mLs by mouth 2 (two) times daily between meals.   feeding supplement Liqd Take 237 mLs by mouth 2 (two) times daily between meals.   acetaminophen 500 MG tablet Commonly known as: TYLENOL Take 500 mg by mouth  every 6 (six) hours as needed for moderate pain.   albuterol 108 (90 Base) MCG/ACT inhaler Commonly known as: VENTOLIN HFA INHALE 2 PUFFS INTO THE LUNGS EVERY 4 (FOUR) HOURS AS NEEDED FOR WHEEZING OR SHORTNESS OF BREATH.   Baclofen 5 MG Tabs Take 1 tablet by mouth 2 (two) times daily as needed. What changed: reasons to take this   budesonide 0.5 MG/2ML nebulizer solution Commonly known as: Pulmicort Take 2 mLs (0.5 mg total) by nebulization 2 (two) times daily. What changed:   when to take this  reasons to take this   dexamethasone 4 MG tablet Commonly known as: DECADRON Take 1 tablet (4 mg total) by mouth daily. What changed:   when to take this  additional instructions   LORazepam 0.5 MG tablet Commonly known as: Ativan Take 1 tablet (0.5 mg total) by mouth every 6 (six) hours as needed for anxiety.   metoprolol succinate 50 MG 24 hr tablet Commonly known as: TOPROL-XL 1 tab po q day What changed:   how much to take  how to take  this  when to take this  additional instructions   multivitamin with minerals Tabs tablet Take 1 tablet by mouth daily. Start taking on: August 12, 2020   ondansetron 8 MG tablet Commonly known as: Zofran Take 1 tablet (8 mg total) by mouth 2 (two) times daily as needed for refractory nausea / vomiting. Start on day 3 after carboplatin chemo. What changed: additional instructions   pantoprazole 40 MG tablet Commonly known as: PROTONIX Take 1 tablet (40 mg total) by mouth daily. Start taking on: August 12, 2020   polyethylene glycol 17 g packet Commonly known as: MIRALAX / GLYCOLAX Take 17 g by mouth 2 (two) times daily.   prochlorperazine 10 MG tablet Commonly known as: COMPAZINE Take 1 tablet (10 mg total) by mouth every 6 (six) hours as needed (Nausea or vomiting). What changed: reasons to take this   senna 8.6 MG Tabs tablet Commonly known as: SENOKOT Take 2 tablets (17.2 mg total) by mouth daily. If diarrhea, temporarily hold. Start taking on: August 12, 2020   sodium chloride 0.65 % Soln nasal spray Commonly known as: OCEAN Place 1 spray into both nostrils daily as needed for congestion.   traMADol 50 MG tablet Commonly known as: ULTRAM Take 1 tablet (50 mg total) by mouth every 6 (six) hours as needed. What changed: reasons to take this   VISINE EXTRA OP Place 1 drop into both eyes daily as needed (dry eyes).      No Known Allergies    Procedures/Studies: CT Abdomen Pelvis Wo Contrast  Result Date: 08/06/2020 CLINICAL DATA:  Abdominal pain and distension EXAM: CT ABDOMEN AND PELVIS WITHOUT CONTRAST TECHNIQUE: Multidetector CT imaging of the abdomen and pelvis was performed following the standard protocol without IV contrast. COMPARISON:  07/24/2020 FINDINGS: Lower chest: No acute abnormality. Hepatobiliary: Multiple hypodensities are noted scattered throughout the liver consistent with the metastatic lesions seen previously. The overall appearance is  stable from the prior exam. Gallbladder is partially distended. Pancreas: Unremarkable. No pancreatic ductal dilatation or surrounding inflammatory changes. Spleen: Normal in size without focal abnormality. Adrenals/Urinary Tract: Adrenal glands are within normal limits. Kidneys demonstrate a normal appearance bilaterally. Tiny nonobstructing right renal stone is noted in the lower pole. No obstructive changes are seen. The bladder is well distended. Stomach/Bowel: Scattered diverticular change of the colon is noted without evidence of diverticulitis. No obstructive changes are seen. The appendix is within  normal limits. Small bowel and stomach are unremarkable. Vascular/Lymphatic: Abdominal aorta demonstrates atherosclerotic calcification as well as a stent graft in satisfactory position. No aneurysmal dilatation is seen. No significant lymphadenopathy is noted. Reproductive: Uterus and bilateral adnexa are unremarkable. Other: Mild free fluid is noted within the pelvis slightly increased when compared with the prior exam. Mild fluid is noted along the liver is well. Musculoskeletal: Degenerative changes of lumbar spine are noted. IMPRESSION: Stable appearing hepatic enlargement with underlying hepatic metastatic disease. New mild ascites not significant to allow for safe paracentesis. Tiny nonobstructing right renal stone. Aortic stent graft in satisfactory position. No aneurysmal dilatation is seen. Diverticulosis without diverticulitis. No obstructive changes are seen. Electronically Signed   By: Inez Catalina M.D.   On: 08/06/2020 14:52   US RENAL  Result Date: 08/09/2020 CLINICAL DATA:  75 year old female with abdominal pain. Metastatic lung cancer. Renal insufficiency. EXAM: RENAL / URINARY TRACT ULTRASOUND COMPLETE COMPARISON:  Noncontrast CT Abdomen and Pelvis 08/06/2020, and earlier. FINDINGS: Right Kidney: Renal measurements: 8.7 x 3.7 x 3.7 cm = volume: 62 mL. Echogenicity within normal limits. No  mass or hydronephrosis visualized. Left Kidney: Renal measurements: 9.3 x 4.7 x 4.8 cm = volume: 109 mL. Echogenicity within normal limits. No mass or hydronephrosis visualized. Bladder: Appears normal for degree of bladder distention. Other: Heterogeneous hepatic echotexture compatible with known liver metastases. IMPRESSION: No acute renal finding. Diminutive but otherwise negative ultrasound appearance of the kidneys. Electronically Signed   By: Genevie Ann M.D.   On: 08/09/2020 07:44   DG CHEST PORT 1 VIEW  Result Date: 08/09/2020 CLINICAL DATA:  Shortness of breath EXAM: PORTABLE CHEST 1 VIEW COMPARISON:  08/06/2020 FINDINGS: Ill-defined opacities are again identified in the upper right lung and mid left lung with architectural distortion previously characterized on chest CT. Unchanged ill-defined density at the left lung base likely reflecting nipple shadow. Stable cardiomediastinal contours. No pleural effusion or pneumothorax. IMPRESSION: No new finding since 08/06/2020. Electronically Signed   By: Macy Mis M.D.   On: 08/09/2020 16:55   DG Chest Port 1 View  Result Date: 08/06/2020 CLINICAL DATA:  Sepsis, history of lung liver cancer, generalized abdominal pain for 3 days EXAM: PORTABLE CHEST 1 VIEW COMPARISON:  CT 07/24/2020 FINDINGS: Persistent regions of architectural distortion in the right upper lobe and left mid lung may correspond with areas of previously treated malignancy. No superimposed consolidative process or edema is seen. Likely nipple shadow projecting over the left lung base. No pneumothorax or effusion. Stable cardiomediastinal contours with a calcified aorta. No acute osseous or soft tissue abnormality. Telemetry leads overlie the chest. IMPRESSION: Persistent regions of architectural distortion in the right upper lobe and left mid lung may correspond with areas of previously treated malignancy. No acute cardiopulmonary abnormality. Probable nipple shadow in the left lung base. If  there is clinical concern, could consider repeat imaging with nipple markers. Electronically Signed   By: Lovena Le M.D.   On: 08/06/2020 16:19     Subjective: Patient interviewed and examined along with her female RN in the room.  Reports feeling "good".  Abdominal pain 2/10 in severity and improved since admission.  Reports having a large BM today.  Denies dyspnea.  Tolerating diet but does not like hospital food.  States that she lives with her daughter and daughter's family.  Discharge Exam:  Vitals:   08/10/20 2047 08/11/20 0449 08/11/20 0934 08/11/20 1258  BP: 121/70 110/66  124/72  Pulse: 87 86  87  Resp: '20 20  20  '$ Temp: (!) 97.4 F (36.3 C)   98 F (36.7 C)  TempSrc: Oral   Oral  SpO2: 96% 94% 94% 96%  Weight:      Height:        General exam: Elderly female, moderately built, frail and chronically ill  sitting up comfortably at edge of bed.  Appears to be in good spirits.  Did not appear in any distress. Respiratory system: Clear to auscultation.  No wheezing, rhonchi or crackles.  No increased work of breathing. Cardiovascular system: S1 & S2 heard, RRR. No JVD, murmurs, rubs, gallops or clicks. No pedal edema.   Gastrointestinal system: Abdomen with epigastric fullness and appreciate ill-defined mass with no tenderness, rigidity, guarding or rebound.  Not pulsatile. Normal bowel sounds heard.  ?  Right upper quadrant small mass. Central nervous system:  Alert and oriented.  No focal deficits. Extremities: Symmetric 5 x 5 power. Skin: No rashes, lesions or ulcers Psychiatry: Judgement and insight appear normal. Mood & affect appropriate.     The results of significant diagnostics from this hospitalization (including imaging, microbiology, ancillary and laboratory) are listed below for reference.     Microbiology: Recent Results (from the past 240 hour(s))  Blood culture (routine x 2)     Status: None (Preliminary result)   Collection Time: 08/06/20 12:35 PM    Specimen: BLOOD  Result Value Ref Range Status   Specimen Description   Final    BLOOD RIGHT ANTECUBITAL Performed at [facility], [place] [address], [place], [place] [address]    Special Requests   Final    BOTTLES DRAWN AEROBIC AND ANAEROBIC Blood Culture adequate volume Performed at [facility] [address], [place], [place] [address]    Culture   Final    NO GROWTH 4 DAYS Performed at [facility], [place] [address], [place], [place] [address]    Report Status PENDING  Incomplete  Urine culture     Status: Abnormal   Collection Time: 08/06/20  3:35 PM   Specimen: Urine, Random  Result Value Ref Range Status   Specimen Description   Final    URINE, RANDOM Performed at [facility] [address] [place], [place] [address]    Special Requests   Final    NONE Performed at [facility], [place] [address] [place], [place] [address]    Culture (A)  Final    <10,000 COLONIES/mL INSIGNIFICANT GROWTH Performed at [facility] [address] [place], [place] [address]    Report Status 08/08/2020 FINAL  Final  Resp Panel by RT-PCR (Flu A&B, Covid) Nasopharyngeal Swab     Status: None   Collection Time: 08/06/20  5:28 PM   Specimen: Nasopharyngeal Swab; Nasopharyngeal(NP) swabs in vial transport medium  Result Value Ref Range Status   SARS Coronavirus 2 by RT PCR NEGATIVE NEGATIVE Final    Comment: (NOTE) SARS-CoV-2 target nucleic acids are NOT DETECTED.  The SARS-CoV-2 RNA is generally detectable in upper respiratory specimens during the acute phase of infection. The lowest concentration of SARS-CoV-2 viral copies this assay can detect is 138 copies/mL. A negative result does not preclude SARS-Cov-2 infection and should not be used as the sole basis for treatment or other patient management decisions. A negative result may occur with  improper specimen collection/handling, submission of specimen  other than nasopharyngeal swab, presence of viral mutation(s) within the areas targeted by this  assay, and inadequate number of viral copies(<138 copies/mL). A negative result must be combined with clinical observations, patient history, and epidemiological information. The expected result is Negative.  Fact Sheet for Patients:  EntrepreneurPulse.com.au  Fact Sheet for Healthcare Providers:  IncredibleEmployment.be  This test is no t yet approved or cleared by the [place] FDA and  has been authorized for detection and/or diagnosis of SARS-CoV-2 by FDA under an Emergency Use Authorization (EUA). This EUA will remain  in effect (meaning this test can be used) for the duration of the COVID-19 declaration under Section 564(b)(1) of the Act, 21 U.S.C.section 360bbb-3(b)(1), unless the authorization is terminated  or revoked sooner.       Influenza A by PCR NEGATIVE NEGATIVE Final   Influenza B by PCR NEGATIVE NEGATIVE Final    Comment: (NOTE) The Xpert Xpress SARS-CoV-2/FLU/RSV plus assay is intended as an aid in the diagnosis of influenza from Nasopharyngeal swab specimens and should not be used as a sole basis for treatment. Nasal washings and aspirates are unacceptable for Xpert Xpress SARS-CoV-2/FLU/RSV testing.  Fact Sheet for Patients: EntrepreneurPulse.com.au  Fact Sheet for Healthcare Providers: IncredibleEmployment.be  This test is not yet approved or cleared by the [place] FDA and has been authorized for detection and/or diagnosis of SARS-CoV-2 by FDA under an Emergency Use Authorization (EUA). This EUA will remain in effect (meaning this test can be used) for the duration of the COVID-19 declaration under Section 564(b)(1) of the Act, 21 U.S.C. section 360bbb-3(b)(1), unless the authorization is terminated or revoked.  Performed at [facility], [place] [address] [place], [place] [address]      Labs: CBC: Recent Labs  Lab 08/06/20 1235 08/07/20 0332 08/08/20 0601  WBC 13.9* 12.9* 13.1*  NEUTROABS 12.4*  --   --   HGB 13.9 10.9* 11.8*  HCT 45.3 36.0 39.2  MCV 90.1 92.1 90.7  PLT 439* 313 024    Basic Metabolic Panel: Recent Labs  Lab 08/08/20 0601 08/09/20 0546 08/09/20 1629 08/10/20 0607 08/11/20 0551  NA 132* 133* 135 135 135  K 5.1 5.7* 5.1 4.8 4.7  CL 97* 99 99 100 103  CO2 20* 21* 19* 20* 21*  GLUCOSE 77 127* 120* 127* 119*  BUN 39* 51* 55* 59* 51*  CREATININE 2.16* 2.57* 2.58* 2.40* 1.96*  CALCIUM 8.9 8.8* 8.6* 8.4* 8.4*    Liver Function Tests: Recent Labs  Lab 08/06/20 1235 08/07/20 0332 08/09/20 0546 08/10/20 0607  AST 93* 85* 97* 104*  ALT 46* 37 41 44  ALKPHOS 656* 437* 332* 441*  BILITOT 2.2* 1.1 1.3* 1.3*  PROT 7.6 5.7* 5.9* 5.5*  ALBUMIN 3.4* 2.5* 2.6* 2.5*    Urinalysis    Component Value Date/Time   COLORURINE YELLOW 08/06/2020 1535   APPEARANCEUR HAZY (A) 08/06/2020 1535   LABSPEC 1.018 08/06/2020 1535   PHURINE 5.0 08/06/2020 1535   GLUCOSEU NEGATIVE 08/06/2020 1535   HGBUR NEGATIVE 08/06/2020 1535   BILIRUBINUR NEGATIVE 08/06/2020 1535   KETONESUR 5 (A) 08/06/2020 1535   PROTEINUR NEGATIVE 08/06/2020 1535   UROBILINOGEN 0.2 09/25/2010 0357   NITRITE NEGATIVE 08/06/2020 1535   LEUKOCYTESUR SMALL (A) 08/06/2020 1535    I discussed in detail with patient's daughter via phone, updated care and answered all questions.  She was appreciative of the call.  Time coordinating discharge: 35 minutes  SIGNED:  Vernell Leep, MD, [place], [facility]. [facility]  To contact the attending provider between 7A-7P or the covering provider during after  hours 7P-7A, please log into the web site [email or web address] and access using universal  password for that web site. If you do not have the password, please call the hospital operator.

## 2020-08-12 MED ORDER — OXYCODONE HCL 5 MG PO TABS
5.0000 mg | ORAL_TABLET | Freq: Three times a day (TID) | ORAL | Status: DC | PRN
  Administered 2020-08-12 – 2020-08-14 (×5): 5 mg via ORAL
  Filled 2020-08-12 (×5): qty 1

## 2020-08-12 NOTE — Progress Notes (Signed)
Physical Therapy Treatment Patient Details Name: Chelsea Baker MRN: [id number] DOB: [birth year] Today's Date: 08/12/2020    History of Present Illness "75 year old female with history of non-small cell lung cancer with progressive liver metastasis, not responsive to first-line chemo + immunotherapy, follows with Dr. [name], oncology and waiting on mutational profile to see if there are any other targetable mutations for treatment, presented with 3 days of abdominal pain and initial concern for sepsis.  At this point, infectious etiology felt less likely and abdominal pain likely related to progressive liver mets."    PT Comments    Pt assisted to/from bathroom using rollator however too fatigued to ambulate in hallway after.  Pt requesting to rest at EOB.  Pt reports feeling overall very tired and required more assist today then previous session.   Follow Up Recommendations  Home health PT;Supervision for mobility/OOB     Equipment Recommendations  Other (comment) (4 wheeled walker with seat)    Recommendations for Other Services       Precautions / Restrictions Precautions Precautions: Fall    Mobility  Bed Mobility Overal bed mobility: Needs Assistance Bed Mobility: Supine to Sit     Supine to sit: Min guard     General bed mobility comments: pt left sitting EOB per request with bed alarm in place upon therapist leaving room    Transfers Overall transfer level: Needs assistance Equipment used: 4-wheeled walker Transfers: Sit to/from Stand Sit to Stand: Min assist         General transfer comment: verbal cues for hand placement; assist for rise and steady  Ambulation/Gait Ambulation/Gait assistance: Min assist Gait Distance (Feet): 8 Feet (x2) Assistive device: 4-wheeled walker Gait Pattern/deviations: Step-through pattern;Decreased stride length;Trunk flexed     General Gait Details: pt only able to tolerate ambulating to/from bathroom with rollator today,  pt reports feeling extremely tired, declined hallway ambulation   Stairs             Wheelchair Mobility    Modified Rankin (Stroke Patients Only)       Balance Overall balance assessment: Needs assistance         Standing balance support: Bilateral upper extremity supported Standing balance-Leahy Scale: Poor Standing balance comment: external assist required                            Cognition Arousal/Alertness: Awake/alert Behavior During Therapy: WFL for tasks assessed/performed Overall Cognitive Status: Within Functional Limits for tasks assessed                                        Exercises      General Comments        Pertinent Vitals/Pain Pain Assessment: 0-10 Pain Score: 4  Pain Location: right abdomen Pain Descriptors / Indicators: Aching Pain Intervention(s): Repositioned;Monitored during session    Home Living                      Prior Function            PT Goals (current goals can now be found in the care plan section) Progress towards PT goals: Progressing toward goals    Frequency    Min 3X/week      PT Plan Current plan remains appropriate    Co-evaluation  AM-PAC PT "6 Clicks" Mobility   Outcome Measure  Help needed turning from your back to your side while in a flat bed without using bedrails?: A Little Help needed moving from lying on your back to sitting on the side of a flat bed without using bedrails?: A Little Help needed moving to and from a bed to a chair (including a wheelchair)?: A Little Help needed standing up from a chair using your arms (e.g., wheelchair or bedside chair)?: A Little Help needed to walk in hospital room?: A Little Help needed climbing 3-5 steps with a railing? : A Lot 6 Click Score: 17    End of Session Equipment Utilized During Treatment: Gait belt Activity Tolerance: Patient tolerated treatment well Patient left: in bed;with  call bell/phone within reach;with bed alarm set   PT Visit Diagnosis: Difficulty in walking, not elsewhere classified (R26.2);Unsteadiness on feet (R26.81)     Time: 1696-7893 PT Time Calculation (min) (ACUTE ONLY): 17 min  Charges:  $Gait Training: 8-22 mins                     Jannette Spanner PT, DPT Acute Rehabilitation Services Pager: [phone] Office: [phone]  York Ram E 08/12/2020, 2:51 PM

## 2020-08-12 NOTE — Plan of Care (Signed)
  Problem: Health Behavior/Discharge Planning: Goal: Ability to manage health-related needs will improve Outcome: Progressing   Problem: Clinical Measurements: Goal: Diagnostic test results will improve Outcome: Progressing   Problem: Pain Managment: Goal: General experience of comfort will improve Outcome: Progressing   Problem: Clinical Measurements: Goal: Will remain free from infection Outcome: Adequate for Discharge   Problem: Nutrition: Goal: Adequate nutrition will be maintained Outcome: Adequate for Discharge

## 2020-08-12 NOTE — Progress Notes (Addendum)
Daily Progress Note   Patient Name: Chelsea Baker       Date: 08/12/2020 DOB: 1945/11/23  Age: 75 y.o. MRN#: [id number] Attending Physician: Modena Jansky, MD Primary Care Physician: Elise Benne Admit Date: 08/06/2020  Reason for Consultation/Follow-up: Non pain symptom management and Pain control  Subjective: I saw and examined Chelsea Baker today.  She was lying in bed in no distress.  She denies any complaints other than reporting she is feeling a little bit tired and "groggy."  Tells me that she did not sleep as well last night.  Sometimes she feels this way after being stuck in bed for couple of days.    MAR reviewed that last night she got higher dose of oxycodone (10 mg) as well as dose of Ativan.  These may be factors contributing to sleepiness today.  I discussed with her regarding both pain management and her nausea.  She currently denies either of these and states that she really wants to be able to go home this afternoon.  Length of Stay: 6  Current Medications: Scheduled Meds:  . (feeding supplement) PROSource Plus  30 mL Oral BID BM  . budesonide  0.5 mg Nebulization BID  . dexamethasone  4 mg Oral Daily  . feeding supplement  237 mL Oral BID BM  . heparin  5,000 Units Subcutaneous Q8H  . metoprolol succinate  50 mg Oral Daily  . multivitamin with minerals  1 tablet Oral Daily  . pantoprazole  40 mg Oral Daily  . polyethylene glycol  17 g Oral BID  . senna  2 tablet Oral Daily  . sodium chloride flush  3 mL Intravenous Q12H    Continuous Infusions:   PRN Meds: albuterol, bisacodyl, HYDROmorphone (DILAUDID) injection, LORazepam, ondansetron **OR** ondansetron (ZOFRAN) IV, oxyCODONE, prochlorperazine, sodium chloride, sodium phosphate,  traMADol  Physical Exam     General: Alert, awake, in no acute distress. Frail and chronically ill appearing.  Appears to be a little sleepier today  HEENT: No bruits, no goiter, no JVD Heart: Regular rate and rhythm. No murmur appreciated. Lungs: Good air movement, clear Abdomen: Soft, nontender, RUQ mass, positive bowel sounds.  Ext: No significant edema Skin: Warm and dry Neuro: Grossly intact, nonfocal.      Vital Signs: BP 117/80   Pulse 92  Temp 98.2 F (36.8 C) (Oral)   Resp 20   Ht 5' (1.524 m)   Wt 60.3 kg   SpO2 95%   BMI 25.96 kg/m  SpO2: SpO2: 95 % O2 Device: O2 Device: Room Air O2 Flow Rate: O2 Flow Rate (L/min): 1 L/min  Intake/output summary:   Intake/Output Summary (Last 24 hours) at 08/12/2020 1428 Last data filed at 08/12/2020 0400 Gross per 24 hour  Intake 120 ml  Output -  Net 120 ml   LBM: Last BM Date: 08/10/20 Baseline Weight: Weight: 60.3 kg Most recent weight: Weight: 60.3 kg       Palliative Assessment/Data:    Flowsheet Rows   Flowsheet Row Most Recent Value  Intake Tab   Referral Department Hospitalist  Unit at Time of Referral Med/Surg Unit  Clinical Assessment   Psychosocial & Spiritual Assessment   Palliative Care Outcomes       Patient Active Problem List   Diagnosis Date Noted  . Liver metastases ([place])   . Abdominal pain 08/06/2020  . AKI (acute kidney injury) ([place]) 08/06/2020  . CKD (chronic kidney disease), stage III ([place]) 08/06/2020  . On home O2   . Lung tumor   . Lung cancer ([place])   . Lumbar spondylolysis   . Hypokalemia   . Hiatal hernia   . GERD (gastroesophageal reflux disease)   . Diverticulosis   . AAA (abdominal aortic aneurysm) ([place])   . Non-small cell lung cancer metastatic to liver ([place]) 05/17/2020  . Counseling regarding advance care planning and goals of care 05/17/2020  . Malignant neoplasm of upper lobe of left lung ([place]) 03/22/2018  . Healthcare maintenance 12/21/2017  . Hyperlipidemia  12/21/2017  . Hypertension 08/25/2017  . Chest discomfort 08/10/2017  . Abnormal echocardiogram 08/10/2017  . Hypoxia   . Dilated cardiomyopathy ([place]) 07/17/2017  . Shortness of breath   . Protein-calorie malnutrition ([place])   . Malignant neoplasm of upper lobe of right lung ([place]) 07/12/2017  . Tachycardia 07/12/2017  . Hyponatremia 07/12/2017  . Cavitating mass in right upper lung lobe 07/12/2017  . AAA (abdominal aortic aneurysm) without rupture ([place]) 04/10/2014  . Aftercare following surgery of the circulatory system 04/10/2014  . Aftercare following surgery of the circulatory system, [place] 11/15/2012    Palliative Care Assessment & Plan   Patient Profile: 75 y.o. female  with past medical history of NSCLC with progressive liver mets admitted on 08/06/2020 with worsened abdominal pain.  Initial concern for infection, but infectious etiology less likely and increased pain likely due to metastatic disease.  Palliative consulted for assistance with pain management.   Recommendations/Plan:  Currently denies pain or nausea.  She denies all complaints and reports being hopeful to be able to go home this afternoon.  At the same time, however, she is groggy today and Chelsea Baker reportedly concerned about taking her home with her current mental status.  Due to grogginess after receiving higher dose oxycodone and Ativan last night, I discussed with Dr. Algis Liming and plan to decrease max dose of oxycodone to 5mg  every 8 hours and will also d/c prn dilaudid as she has not needed any and we want to minimize opioids as much as possible.  While I am going off service, I will ask my partner, Dr. Rowe Pavy, to check in tomorrow for symptom management to see how she is doing.  Code Status:    Code Status Orders  (From admission, onward)         Start  Ordered   08/06/20 1932  Do not attempt resuscitation (DNR)  Continuous       Question Answer Comment  In the event of cardiac or respiratory ARREST  Do not call a "code blue"   In the event of cardiac or respiratory ARREST Do not perform Intubation, CPR, defibrillation or ACLS   In the event of cardiac or respiratory ARREST Use medication by any route, position, wound care, and other measures to relive pain and suffering. May use oxygen, suction and manual treatment of airway obstruction as needed for comfort.      08/06/20 1931        Code Status History    Date Active Date Inactive Code Status Order ID Comments User Context   07/13/2017 0047 07/18/2017 1838 Full Code [id number]  Jani Gravel, MD Inpatient   Advance Care Planning Activity      Discharge Planning:  Home with Palliative Services  Care plan was discussed with patient  Thank you for allowing the Palliative Medicine Team to assist in the care of this patient.   Time In: 1240 Time Out: 1300 Total Time 20 Prolonged Time Billed No      Greater than 50%  of this time was spent counseling and coordinating care related to the above assessment and plan.  Micheline Rough, MD  Please contact Palliative Medicine Team phone at [phone] for questions and concerns.

## 2020-08-12 NOTE — Progress Notes (Signed)
Manufacturing engineer [facility]) Community Based Palliative Care       This patient has been referred to our palliative care services in the community.  [facility] will continue to follow for any discharge planning needs and to coordinate admission onto palliative care.    Thank you for the opportunity to participate in this patient's care.     Domenic Moras, BSN, RN [facility] Liaison   [phone] (24h on call)

## 2020-08-12 NOTE — Care Management Important Message (Signed)
Important Message  Patient Details IM Letter given to the Patient. Name: Chelsea Baker MRN: [id number] Date of Birth: Mar 11, 1946   Medicare Important Message Given:  Yes     Kerin Salen 08/12/2020, 12:32 PM

## 2020-08-12 NOTE — Progress Notes (Signed)
PROGRESS NOTE   Chelsea Baker  JJH:[id number]    DOB: 10-17-1945    DOA: 08/06/2020  PCP: Elise Benne   I have briefly reviewed patients previous medical records in [facility].  Chief Complaint  Patient presents with  . Abdominal Pain    Brief Narrative:  75 year old female with history of non-small cell lung cancer with progressive liver metastasis, not responsive to first-line chemo + immunotherapy, follows with Dr. [name], oncology and waiting on mutational profile to see if there are any other targetable mutations for treatment, presented with 3 days of abdominal pain and initial concern for sepsis.  At this point, infectious etiology felt less likely and abdominal pain likely related to progressive liver mets.  Oncology consulted 2/10.   Assessment & Plan:  Principal Problem:   Abdominal pain Active Problems:   Dilated cardiomyopathy (HCC)   Non-small cell lung cancer metastatic to liver (HCC)   AKI (acute kidney injury) ([place])   CKD (chronic kidney disease), stage III (HCC)   Liver metastases (HCC)   Abdominal pain suspected due to progressive liver mets  CT abdomen pelvis without contrast 2/8: Stable appearing hepatic enlargement with underlying hepatic metastatic disease, new mild ascites not enough for safe paracentesis. I communicated with her primary oncologist on 2/9: Notes as documented above in brief narrative. He did initiate [place] discussions and palliative care during the last visit. Supportive treatment of pain and initiated bowel regimen for constipation which may also help. No UTI symptoms, discontinued antibiotics.  Oncology consultation 2/10 appreciated: Reportedly had been started on dexamethasone 4 mg twice daily to help with capsular pain but patient had stopped due to sleep interference. They have resumed dexamethasone 4 mg daily at noontime and consulted palliative care for symptom management, goals of care.   Palliative care  consultation 2/12 appreciated: DNR/DNI has been established.  Agree that her pain improvement is probably largely driven by restarting steroids.  Continue steroids at discharge and patient has been counseled to be compliant with these and this was discussed with patient's daughter as well.    Recommend continued GOC and palliative care conversations during follow-up with her oncologist.  On 2/13, abdominal pain had improved over the last 3 days prior, after speaking with patient and daughter, had completed her discharge paperwork. However when daughter came to pick patient up, she reported worsening abdominal pain and DC was canceled.  Received multiple doses (3) of oxycodone since noon yesterday, a dose of Ativan last night. Patient somnolent this morning, reports pain is improved again.  KUB 2/13 without acute findings.  Discussed with PMT/Dr. Domingo Cocking and requested follow-up for symptom management.  SIRS  Met criteria on admission. Initially presumed to have UTI although she did not have any urinary symptoms. Low index of suspicion for SBP to given small volume of ascites.  Discontinued antibiotics. Sepsis ruled out.  Acute kidney injury complicating CKD stage IIIa  Most likely due to malignancy related poor appetite and poor oral intake.     Last creatinine PTA on 07/26/2020: 1.96.  Presented with creatinine of 2.44.    Despite IV hydration, this peaked to 2.58.  Renal ultrasound was thereby obtained and unremarkable.    IV fluids were continued.  Creatinine finally improved to 1.96 on 2/13.    Recommend continued ad lib. oral hydration at discharge and avoid nephrotoxic's.  Follow BMP as outpatient at the cancer center.  Follow BMP in a.m.  Hyperkalemia  Potassium 5.7on 2/11. Provided dose of Lokelma.  Low potassium diet.  Resolved.  Monitor BMP as outpatient.  Hyponatremia  Could be a combination of hypovolemia or SIADH related to underlying  cancer.Normalized.  Constipation  Aggressive bowel regimen which may help with her abdominal pain as well.  Continue MiraLAX and senna and patient counseled regarding titration of these medications as needed if she develops diarrhea  Anemia of malignancy and cancer treatment  Stable  Non-small cell lung cancer with progressive liver meta stasis  Management as noted above. Mildly abnormal LFTs likely related to this.  Dilated cardiomyopathy, LVEF 39-76%, grade 1 diastolic dysfunction  Clinically euvolemic.  Body mass index is 25.96 kg/m.  Altered mental status/acute toxic encephalopathy  Currently most likely due to medications.  Discussed in detail with RN and advised holding all sedative medications including Ativan, opioids until patient consistently alert.  Monitor closely   DVT prophylaxis: heparin injection 5,000 Units Start: 08/06/20 2200     Code Status: DNR Family Communication: I discussed in detail with patient's daughter at bedside on 2/13 when she had come up to take patient home. Disposition:  Status is: Inpatient  Remains inpatient appropriate because:Inpatient level of care appropriate due to severity of illness   Dispo: The patient is from: Home              Anticipated d/c is to: Home              Anticipated d/c date is: 08/13/2020              Patient currently is not medically stable to d/c.   Difficult to place patient No        Consultants:   Medical oncology PMT  Procedures:   None  Antimicrobials:    Anti-infectives (From admission, onward)   Start     Dose/Rate Route Frequency Ordered Stop   08/07/20 1400  cefTRIAXone (ROCEPHIN) 2 g in sodium chloride 0.9 % 100 mL IVPB  Status:  Discontinued        2 g 200 mL/hr over 30 Minutes Intravenous Every 24 hours 08/06/20 1931 08/07/20 0748   08/06/20 1345  cefTRIAXone (ROCEPHIN) 2 g in sodium chloride 0.9 % 100 mL IVPB        2 g 200 mL/hr over 30 Minutes Intravenous  Once  08/06/20 1333 08/06/20 1435   08/06/20 1345  metroNIDAZOLE (FLAGYL) IVPB 500 mg        500 mg 100 mL/hr over 60 Minutes Intravenous  Once 08/06/20 1333 08/06/20 1408        Subjective:  Sleepy this morning. Noticed that she received a dose of Ativan last night. Again reports that pain is mild and improved. Denies complaints. Somewhat confused. Unable to stay awake consistently.  Objective:   Vitals:   08/11/20 1258 08/11/20 2058 08/12/20 0600 08/12/20 0837  BP: 124/72 (!) 142/75 117/80   Pulse: 87 97 92   Resp: _0 Temp: 98 F (36.7 C) 98.2 F (36.8 C) 98.2 F (36.8 C)   TempSrc: Oral Oral Oral   SpO2: 96% 96% 94% 95%  Weight:      Height:        General exam: Elderly female, moderately built, frail and chronically ill briefly seen sitting up at edge of bed then laid down in bed. Respiratory system: Clear to auscultation.  No wheezing, rhonchi or crackles.  No increased work of breathing. Cardiovascular system: S1 & S2 heard, RRR. No JVD, murmurs, rubs, gallops or clicks. No pedal edema.  Gastrointestinal system: Abdomen with epigastric fullness and appreciate ill-defined mass with no tenderness, rigidity, guarding or rebound.  Not pulsatile. No other organomegaly or masses felt. Normal bowel sounds heard.  Today also appreciate?  Right upper quadrant small mass. No acute findings noted. Central nervous system: Sleepy/drowsy but arousable, oriented x2, answers simple questions but drifts back to sleep.  No focal deficits. Extremities: Symmetric 5 x 5 power. Skin: No rashes, lesions or ulcers Psychiatry: Judgement and insight appear normal. Mood & affect appropriate.     Data Reviewed:   I have personally reviewed following labs and imaging studies   CBC: Recent Labs  Lab 08/06/20 1235 08/07/20 0332 08/08/20 0601  WBC 13.9* 12.9* 13.1*  NEUTROABS 12.4*  --   --   HGB 13.9 10.9* 11.8*  HCT 45.3 36.0 39.2  MCV 90.1 92.1 90.7  PLT 439* 313 330    Basic  Metabolic Panel: Recent Labs  Lab 08/09/20 1629 08/10/20 0607 08/11/20 0551  NA 135 135 135  K 5.1 4.8 4.7  CL 99 100 103  CO2 19* 20* 21*  GLUCOSE 120* 127* 119*  BUN 55* 59* 51*  CREATININE 2.58* 2.40* 1.96*  CALCIUM 8.6* 8.4* 8.4*    Liver Function Tests: Recent Labs  Lab 08/07/20 0332 08/09/20 0546 08/10/20 0607  AST 85* 97* 104*  ALT 37 41 44  ALKPHOS 437* 332* 441*  BILITOT 1.1 1.3* 1.3*  PROT 5.7* 5.9* 5.5*  ALBUMIN 2.5* 2.6* 2.5*    CBG: No results for input(s): GLUCAP in the last 168 hours.  Microbiology Studies:   Recent Results (from the past 240 hour(s))  Blood culture (routine x 2)     Status: None   Collection Time: 08/06/20 12:35 PM   Specimen: BLOOD  Result Value Ref Range Status   Specimen Description   Final    BLOOD RIGHT ANTECUBITAL Performed at [facility], [place] [address], [place], [place] [address]    Special Requests   Final    BOTTLES DRAWN AEROBIC AND ANAEROBIC Blood Culture adequate volume Performed at [facility] [address] [place], [place] [address]    Culture   Final    NO GROWTH 5 DAYS Performed at [facility], [place] [address], [place], [place] [address]    Report Status 08/11/2020 FINAL  Final  Urine culture     Status: Abnormal   Collection Time: 08/06/20  3:35 PM   Specimen: Urine, Random  Result Value Ref Range Status   Specimen Description   Final    URINE, RANDOM Performed at [facility] [address] [place], [place] [address]    Special Requests   Final    NONE Performed at [facility], [place] [address], [place], [place] [address]    Culture (A)  Final    <10,000 COLONIES/mL INSIGNIFICANT GROWTH Performed at [facility] [address] [place], [place] [address]    Report Status 08/08/2020 FINAL  Final  Resp Panel by RT-PCR (Flu A&B, Covid) Nasopharyngeal Swab     Status: None   Collection Time: 08/06/20  5:28 PM    Specimen: Nasopharyngeal Swab; Nasopharyngeal(NP) swabs in vial transport medium  Result Value Ref Range Status   SARS Coronavirus 2 by RT PCR NEGATIVE NEGATIVE Final    Comment: (NOTE) SARS-CoV-2 target nucleic acids are NOT DETECTED.  The SARS-CoV-2 RNA is generally detectable in upper respiratory specimens during the acute phase of infection. The  lowest concentration of SARS-CoV-2 viral copies this assay can detect is 138 copies/mL. A negative result does not preclude SARS-Cov-2 infection and should not be used as the sole basis for treatment or other patient management decisions. A negative result may occur with  improper specimen collection/handling, submission of specimen other than nasopharyngeal swab, presence of viral mutation(s) within the areas targeted by this assay, and inadequate number of viral copies(<138 copies/mL). A negative result must be combined with clinical observations, patient history, and epidemiological information. The expected result is Negative.  Fact Sheet for Patients:  EntrepreneurPulse.com.au  Fact Sheet for Healthcare Providers:  IncredibleEmployment.be  This test is no t yet approved or cleared by the [place] FDA and  has been authorized for detection and/or diagnosis of SARS-CoV-2 by FDA under an Emergency Use Authorization (EUA). This EUA will remain  in effect (meaning this test can be used) for the duration of the COVID-19 declaration under Section 564(b)(1) of the Act, 21 U.S.C.section 360bbb-3(b)(1), unless the authorization is terminated  or revoked sooner.       Influenza A by PCR NEGATIVE NEGATIVE Final   Influenza B by PCR NEGATIVE NEGATIVE Final    Comment: (NOTE) The Xpert Xpress SARS-CoV-2/FLU/RSV plus assay is intended as an aid in the diagnosis of influenza from Nasopharyngeal swab specimens and should not be used as a sole basis for treatment. Nasal washings and aspirates are  unacceptable for Xpert Xpress SARS-CoV-2/FLU/RSV testing.  Fact Sheet for Patients: EntrepreneurPulse.com.au  Fact Sheet for Healthcare Providers: IncredibleEmployment.be  This test is not yet approved or cleared by the [place] FDA and has been authorized for detection and/or diagnosis of SARS-CoV-2 by FDA under an Emergency Use Authorization (EUA). This EUA will remain in effect (meaning this test can be used) for the duration of the COVID-19 declaration under Section 564(b)(1) of the Act, 21 U.S.C. section 360bbb-3(b)(1), unless the authorization is terminated or revoked.  Performed at [facility], [place] [address], [place], [place] [address]      Radiology Studies:  DG Abd Portable 1V  Result Date: 08/11/2020 CLINICAL DATA:  Uncontrolled abdominal pain. EXAM: PORTABLE ABDOMEN - 1 VIEW COMPARISON:  10/01/2010 FINDINGS: The liver is diffusely enlarged. The bowel gas pattern is nonobstructive. In abdominal aortic endograft is again noted. There are no radiopaque kidney stones. IMPRESSION: 1. Nonobstructive bowel gas pattern. 2. Hepatomegaly. Electronically Signed   By: Constance Holster M.D.   On: 08/11/2020 18:53     Scheduled Meds:   . (feeding supplement) PROSource Plus  30 mL Oral BID BM  . budesonide  0.5 mg Nebulization BID  . dexamethasone  4 mg Oral Daily  . feeding supplement  237 mL Oral BID BM  . heparin  5,000 Units Subcutaneous Q8H  . metoprolol succinate  50 mg Oral Daily  . multivitamin with minerals  1 tablet Oral Daily  . pantoprazole  40 mg Oral Daily  . polyethylene glycol  17 g Oral BID  . senna  2 tablet Oral Daily  . sodium chloride flush  3 mL Intravenous Q12H    Continuous Infusions:      LOS: 6 days     Vernell Leep, MD, [place], [facility]. [facility]    To contact the attending provider between 7A-7P or the covering provider during after hours 7P-7A, please log into the web  site [email or web address] and access using universal [place] password for that web site. If you do not have the password, please call the hospital  operator.  08/12/2020, 2:28 PM

## 2020-08-13 DIAGNOSIS — Z515 Encounter for palliative care: Secondary | ICD-10-CM

## 2020-08-13 DIAGNOSIS — G893 Neoplasm related pain (acute) (chronic): Secondary | ICD-10-CM

## 2020-08-13 DIAGNOSIS — N189 Chronic kidney disease, unspecified: Secondary | ICD-10-CM

## 2020-08-13 DIAGNOSIS — G9341 Metabolic encephalopathy: Secondary | ICD-10-CM

## 2020-08-13 DIAGNOSIS — C3490 Malignant neoplasm of unspecified part of unspecified bronchus or lung: Secondary | ICD-10-CM

## 2020-08-13 LAB — BASIC METABOLIC PANEL
Anion gap: 13 (ref 5–15)
BUN: 64 mg/dL — ABNORMAL HIGH (ref 8–23)
CO2: 20 mmol/L — ABNORMAL LOW (ref 22–32)
Calcium: 8.8 mg/dL — ABNORMAL LOW (ref 8.9–10.3)
Chloride: 105 mmol/L (ref 98–111)
Creatinine, Ser: 2.68 mg/dL — ABNORMAL HIGH (ref 0.44–1.00)
GFR, Estimated: 18 mL/min — ABNORMAL LOW (ref >60)
Glucose, Bld: 96 mg/dL (ref 70–99)
Potassium: 5.4 mmol/L — ABNORMAL HIGH (ref 3.5–5.1)
Sodium: 138 mmol/L (ref 135–145)

## 2020-08-13 LAB — AMMONIA: Ammonia: 39 umol/L — ABNORMAL HIGH (ref 9–35)

## 2020-08-13 MED ORDER — LACTATED RINGERS IV SOLN: INTRAVENOUS | Status: DC

## 2020-08-13 MED ORDER — SODIUM ZIRCONIUM CYCLOSILICATE 10 G PO PACK
10.0000 g | PACK | Freq: Two times a day (BID) | ORAL | Status: DC
  Filled 2020-08-13 (×2): qty 1

## 2020-08-13 NOTE — TOC Progression Note (Signed)
Transition of Care [facility]) - Progression Note    Patient Details  Name: Chelsea Baker MRN: [id number] Date of Birth: 1946/03/06  Transition of Care [facility]) CM/SW Contact  Joaquin Courts, RN Phone Number: 08/13/2020, 3:23 PM  Clinical Narrative:    Per hospice rep, equipment will be set up for home and daughter feels she will be able to transport patient home if she has equipment there when patient arrives. Will await confirmation of equipment delivery.       Expected Discharge Plan: Home w Hospice Care Barriers to Discharge: Continued Medical Work up  Expected Discharge Plan and Services Expected Discharge Plan: [place]   Discharge Planning Services: CM Consult Post Acute Care Choice: Hospice Living arrangements for the past 2 months: Single Family Home Expected Discharge Date: 08/11/20                         HH Arranged: PT HH Agency: [place] Date [place]: 08/09/20 Time HH Agency Contacted: [id number] Representative spoke with at [place]: [place]) Interventions    Readmission Risk Interventions Readmission Risk Prevention Plan 08/09/2020  Transportation Screening Complete  PCP or Specialist Appt within 3-5 Days Complete  HRI or Home Care Consult Complete  Palliative Care Screening Complete  Medication Review (RN Care Manager) Complete  Some recent data might be hidden

## 2020-08-13 NOTE — Progress Notes (Signed)
Daily Progress Note   Patient Name: Chelsea Baker       Date: 08/13/2020 DOB: Oct 27, 1945  Age: 75 y.o. MRN#: [id number] Attending Physician: Modena Jansky, MD Primary Care Physician: Elise Benne Admit Date: 08/06/2020  Reason for Consultation/Follow-up: Non pain symptom management and Pain control  Subjective: Patient is sitting up by the side of her bed, appears with generalized weakness. She opens her eyes and is alert, engages appropriately, does appear to have some flat affect, complains of pain in her right side.    Length of Stay: 7  Current Medications: Scheduled Meds:  . (feeding supplement) PROSource Plus  30 mL Oral BID BM  . budesonide  0.5 mg Nebulization BID  . dexamethasone  4 mg Oral Daily  . feeding supplement  237 mL Oral BID BM  . heparin  5,000 Units Subcutaneous Q8H  . metoprolol succinate  50 mg Oral Daily  . multivitamin with minerals  1 tablet Oral Daily  . pantoprazole  40 mg Oral Daily  . polyethylene glycol  17 g Oral BID  . senna  2 tablet Oral Daily  . sodium chloride flush  3 mL Intravenous Q12H  . sodium zirconium cyclosilicate  10 g Oral BID    Continuous Infusions: . lactated ringers 75 mL/hr at 08/13/20 1302    PRN Meds: albuterol, bisacodyl, ondansetron **OR** ondansetron (ZOFRAN) IV, oxyCODONE, prochlorperazine, sodium chloride, sodium phosphate  Physical Exam     General: Alert, awake, in no acute distress. Frail and chronically ill appearing.    HEENT: No bruits, no goiter, no JVD Heart: Regular rate and rhythm. No murmur appreciated. Lungs: Good air movement, clear Abdomen: Soft, nontender, RUQ mass, positive bowel sounds.  Ext: No significant edema Skin: Warm and dry Neuro: Grossly intact, nonfocal.      Vital  Signs: BP 128/73 (BP Location: Left Arm)   Pulse 95   Temp (!) 97.4 F (36.3 C) (Oral)   Resp (!) 24   Ht 5' (1.524 m)   Wt 60.3 kg   SpO2 94%   BMI 25.96 kg/m  SpO2: SpO2: 94 % O2 Device: O2 Device: Room Air O2 Flow Rate: O2 Flow Rate (L/min): 1 L/min  Intake/output summary:   Intake/Output Summary (Last 24 hours) at 08/13/2020 1307 Last data filed at 08/13/2020 1048 Gross  per 24 hour  Intake 123 ml  Output --  Net 123 ml   LBM: Last BM Date: 08/10/20 Baseline Weight: Weight: 60.3 kg Most recent weight: Weight: 60.3 kg       Palliative Assessment/Data:    Flowsheet Rows   Flowsheet Row Most Recent Value  Intake Tab   Referral Department Hospitalist  Unit at Time of Referral Med/Surg Unit  Clinical Assessment   Psychosocial & Spiritual Assessment   Palliative Care Outcomes       Patient Active Problem List   Diagnosis Date Noted  . Liver metastases ([place])   . Abdominal pain 08/06/2020  . AKI (acute kidney injury) ([employer]) 08/06/2020  . CKD (chronic kidney disease), stage III ([place]) 08/06/2020  . On home O2   . Lung tumor   . Lung cancer ([place])   . Lumbar spondylolysis   . Hypokalemia   . Hiatal hernia   . GERD (gastroesophageal reflux disease)   . Diverticulosis   . AAA (abdominal aortic aneurysm) ([place])   . Non-small cell lung cancer metastatic to liver ([place]) 05/17/2020  . Counseling regarding advance care planning and goals of care 05/17/2020  . Malignant neoplasm of upper lobe of left lung ([place]) 03/22/2018  . Healthcare maintenance 12/21/2017  . Hyperlipidemia 12/21/2017  . Hypertension 08/25/2017  . Chest discomfort 08/10/2017  . Abnormal echocardiogram 08/10/2017  . Hypoxia   . Dilated cardiomyopathy ([place]) 07/17/2017  . Shortness of breath   . Protein-calorie malnutrition ([place])   . Malignant neoplasm of upper lobe of right lung ([place]) 07/12/2017  . Tachycardia 07/12/2017  . Hyponatremia 07/12/2017  . Cavitating mass in right upper lung lobe  07/12/2017  . AAA (abdominal aortic aneurysm) without rupture ([place]) 04/10/2014  . Aftercare following surgery of the circulatory system 04/10/2014  . Aftercare following surgery of the circulatory system, [place] 11/15/2012    Palliative Care Assessment & Plan   Patient Profile: 75 y.o. female  with past medical history of NSCLC with progressive liver mets admitted on 08/06/2020 with worsened abdominal pain.  Initial concern for infection, but infectious etiology less likely and increased pain likely due to metastatic disease.  Palliative consulted for assistance with pain management.   Recommendations/Plan:  Discussed with patient about her current condition as well as current scope of symptom management.  Discussed with Dr. Algis Liming from [facility] as well.  Chart reviewed briefly.  It appears that the patient has gradually progressive functional status decline.   Recommend not using benzodiazepines, recommend as needed oxycodone for pain.  Patient is opioid nave, will not recommend long-acting pain medications as yet.  Discussed with patient about balancing pain management along with patient's ability to be awake alert and to participate in therapy and other activities.  Recommend palliative services following with the patient upon discharge.  Monitor hospital course and overall disease trajectory to determine hospice eligibility.    Code Status:    Code Status Orders  (From admission, onward)         Start     Ordered   08/06/20 1932  Do not attempt resuscitation (DNR)  Continuous       Question Answer Comment  In the event of cardiac or respiratory ARREST Do not call a "code blue"   In the event of cardiac or respiratory ARREST Do not perform Intubation, CPR, defibrillation or ACLS   In the event of cardiac or respiratory ARREST Use medication by any route, position, wound care, and other measures to relive pain and  suffering. May use oxygen, suction and manual treatment of airway obstruction as  needed for comfort.      08/06/20 1931        Code Status History    Date Active Date Inactive Code Status Order ID Comments User Context   07/13/2017 0047 07/18/2017 1838 Full Code [id number]  Jani Gravel, MD Inpatient   Advance Care Planning Activity      Discharge Planning:  Home with Palliative Services  Care plan was discussed with patient and TR[facility] MD.   Thank you for allowing the Palliative Medicine Team to assist in the care of this patient.   Time In: 10 Time Out: 10.35 Total Time 35 Prolonged Time Billed No      Greater than 50%  of this time was spent counseling and coordinating care related to the above assessment and plan.  Loistine Chance, MD  Please contact Palliative Medicine Team phone at [phone] for questions and concerns.

## 2020-08-13 NOTE — Progress Notes (Addendum)
HEMATOLOGY-ONCOLOGY PROGRESS NOTE  SUBJECTIVE: The patient was due to be discharged 2/13. Discharge cancelled due to worsening abdominal pain. Was very lethargic 2/14 and narcotics have been adjusted. Has Oxycodone 5 mg every 8 hours as needed for pain now. Last dose of Oxycodone was given on 2/14 at 2245.  The patient is awake at the time my visit.  She has returned from the bedside commode.  She has somewhat short of breath.    She is able to converse and make her needs known.  She reports significant abdominal discomfort particularly in the right upper quadrant.  She rates pain 7-8 out of 10.  She is not having any nausea or vomiting at this time.  She is having some difficulty swallowing pills.  Unable to swallow the multivitamin today.  Pills are getting "stuck" in the back of her throat.  Nursing reports that she does not want to take in the supplements that are ordered for her.  Oncology History  Non-small cell lung cancer metastatic to liver ([place])  05/17/2020 Initial Diagnosis   Non-small cell lung cancer metastatic to liver ([place])   05/25/2020 -  Chemotherapy    Patient is on Treatment Plan: LUNG NSCLC CARBOPLATIN + PACLITAXEL + PEMBROLIZUMAB Q21D X 4 CYCLES / PEMBROLIZUMAB MAINTENANCE Q21D         REVIEW OF SYSTEMS:   Constitutional: Denies fevers, chills.  Reports increase in weakness. Eyes: Denies blurriness of vision Ears, nose, mouth, throat, and face: Denies mucositis or sore throat Respiratory: Denies cough, dyspnea or wheezes Cardiovascular: Denies palpitation, chest discomfort Gastrointestinal: Reports right upper quadrant abdominal pain.  Pain is sharp.  Has some difficulty swallowing pills. Skin: Denies abnormal skin rashes Lymphatics: Denies new lymphadenopathy or easy bruising Neurological:Denies numbness, tingling or new weaknesses Behavioral/Psych: Mood is stable, no new changes  Extremities: No lower extremity edema All other systems were reviewed with the  patient and are negative.  I have reviewed the past medical history, past surgical history, social history and family history with the patient and they are unchanged from previous note.   PHYSICAL EXAMINATION: ECOG PERFORMANCE STATUS: 3 - Symptomatic, >50% confined to bed  Vitals:   08/12/20 2100 08/13/20 0605  BP: 134/89 128/73  Pulse: 94 95  Resp: 20 (!) 24  Temp: 97.6 F (36.4 C) (!) 97.4 F (36.3 C)  SpO2: 91% 94%   Filed Weights   08/07/20 1438  Weight: 60.3 kg    Intake/Output from previous day: 02/14 0701 - 02/15 0700 In: 3 [I.V.:3] Out: -   GENERAL: Chronically ill-appearing female, appears more fatigued and weak today, tachypneic SKIN: skin color, texture, turgor are normal, no rashes or significant lesions EYES: normal, Conjunctiva are pink and non-injected, sclera clear OROPHARYNX: Dry oral mucosa, no thrush or mucositis LUNGS: clear to auscultation and percussion, tachypneic  HEART: regular rate & rhythm and no murmurs and no lower extremity edema ABDOMEN: Positive bowel sounds, distended, tenderness over the right upper quadrant, marked hepatomegaly NEURO: alert & oriented x 3 with fluent speech, no focal motor/sensory deficits  LABORATORY DATA:  I have reviewed the data as listed CMP Latest Ref Rng & Units 08/13/2020 08/11/2020 08/10/2020  Glucose 70 - 99 mg/dL 96 119(H) 127(H)  BUN 8 - 23 mg/dL 64(H) 51(H) 59(H)  Creatinine 0.44 - 1.00 mg/dL 2.68(H) 1.96(H) 2.40(H)  Sodium 135 - 145 mmol/L 138 135 135  Potassium 3.5 - 5.1 mmol/L 5.4(H) 4.7 4.8  Chloride 98 - 111 mmol/L 105 103 100  CO2 22 -  32 mmol/L 20(L) 21(L) 20(L)  Calcium 8.9 - 10.3 mg/dL 8.8(L) 8.4(L) 8.4(L)  Total Protein 6.5 - 8.1 g/dL - - 5.5(L)  Total Bilirubin 0.3 - 1.2 mg/dL - - 1.3(H)  Alkaline Phos 38 - 126 U/L - - 441(H)  AST 15 - 41 U/L - - 104(H)  ALT 0 - 44 U/L - - 44    Lab Results  Component Value Date   WBC 13.1 (H) 08/08/2020   HGB 11.8 (L) 08/08/2020   HCT 39.2 08/08/2020    MCV 90.7 08/08/2020   PLT 330 08/08/2020   NEUTROABS 12.4 (H) 08/06/2020    CT Abdomen Pelvis Wo Contrast  Result Date: 08/06/2020 CLINICAL DATA:  Abdominal pain and distension EXAM: CT ABDOMEN AND PELVIS WITHOUT CONTRAST TECHNIQUE: Multidetector CT imaging of the abdomen and pelvis was performed following the standard protocol without IV contrast. COMPARISON:  07/24/2020 FINDINGS: Lower chest: No acute abnormality. Hepatobiliary: Multiple hypodensities are noted scattered throughout the liver consistent with the metastatic lesions seen previously. The overall appearance is stable from the prior exam. Gallbladder is partially distended. Pancreas: Unremarkable. No pancreatic ductal dilatation or surrounding inflammatory changes. Spleen: Normal in size without focal abnormality. Adrenals/Urinary Tract: Adrenal glands are within normal limits. Kidneys demonstrate a normal appearance bilaterally. Tiny nonobstructing right renal stone is noted in the lower pole. No obstructive changes are seen. The bladder is well distended. Stomach/Bowel: Scattered diverticular change of the colon is noted without evidence of diverticulitis. No obstructive changes are seen. The appendix is within normal limits. Small bowel and stomach are unremarkable. Vascular/Lymphatic: Abdominal aorta demonstrates atherosclerotic calcification as well as a stent graft in satisfactory position. No aneurysmal dilatation is seen. No significant lymphadenopathy is noted. Reproductive: Uterus and bilateral adnexa are unremarkable. Other: Mild free fluid is noted within the pelvis slightly increased when compared with the prior exam. Mild fluid is noted along the liver is well. Musculoskeletal: Degenerative changes of lumbar spine are noted. IMPRESSION: Stable appearing hepatic enlargement with underlying hepatic metastatic disease. New mild ascites not significant to allow for safe paracentesis. Tiny nonobstructing right renal stone. Aortic stent  graft in satisfactory position. No aneurysmal dilatation is seen. Diverticulosis without diverticulitis. No obstructive changes are seen. Electronically Signed   By: Inez Catalina M.D.   On: 08/06/2020 14:52   US RENAL  Result Date: 08/09/2020 CLINICAL DATA:  75 year old female with abdominal pain. Metastatic lung cancer. Renal insufficiency. EXAM: RENAL / URINARY TRACT ULTRASOUND COMPLETE COMPARISON:  Noncontrast CT Abdomen and Pelvis 08/06/2020, and earlier. FINDINGS: Right Kidney: Renal measurements: 8.7 x 3.7 x 3.7 cm = volume: 62 mL. Echogenicity within normal limits. No mass or hydronephrosis visualized. Left Kidney: Renal measurements: 9.3 x 4.7 x 4.8 cm = volume: 109 mL. Echogenicity within normal limits. No mass or hydronephrosis visualized. Bladder: Appears normal for degree of bladder distention. Other: Heterogeneous hepatic echotexture compatible with known liver metastases. IMPRESSION: No acute renal finding. Diminutive but otherwise negative ultrasound appearance of the kidneys. Electronically Signed   By: Genevie Ann M.D.   On: 08/09/2020 07:44   DG CHEST PORT 1 VIEW  Result Date: 08/09/2020 CLINICAL DATA:  Shortness of breath EXAM: PORTABLE CHEST 1 VIEW COMPARISON:  08/06/2020 FINDINGS: Ill-defined opacities are again identified in the upper right lung and mid left lung with architectural distortion previously characterized on chest CT. Unchanged ill-defined density at the left lung base likely reflecting nipple shadow. Stable cardiomediastinal contours. No pleural effusion or pneumothorax. IMPRESSION: No new finding since 08/06/2020. Electronically Signed  By: Macy Mis M.D.   On: 08/09/2020 16:55   DG Chest Port 1 View  Result Date: 08/06/2020 CLINICAL DATA:  Sepsis, history of lung liver cancer, generalized abdominal pain for 3 days EXAM: PORTABLE CHEST 1 VIEW COMPARISON:  CT 07/24/2020 FINDINGS: Persistent regions of architectural distortion in the right upper lobe and left mid lung  may correspond with areas of previously treated malignancy. No superimposed consolidative process or edema is seen. Likely nipple shadow projecting over the left lung base. No pneumothorax or effusion. Stable cardiomediastinal contours with a calcified aorta. No acute osseous or soft tissue abnormality. Telemetry leads overlie the chest. IMPRESSION: Persistent regions of architectural distortion in the right upper lobe and left mid lung may correspond with areas of previously treated malignancy. No acute cardiopulmonary abnormality. Probable nipple shadow in the left lung base. If there is clinical concern, could consider repeat imaging with nipple markers. Electronically Signed   By: Lovena Le M.D.   On: 08/06/2020 16:19   DG Abd Portable 1V  Result Date: 08/11/2020 CLINICAL DATA:  Uncontrolled abdominal pain. EXAM: PORTABLE ABDOMEN - 1 VIEW COMPARISON:  10/01/2010 FINDINGS: The liver is diffusely enlarged. The bowel gas pattern is nonobstructive. In abdominal aortic endograft is again noted. There are no radiopaque kidney stones. IMPRESSION: 1. Nonobstructive bowel gas pattern. 2. Hepatomegaly. Electronically Signed   By: Constance Holster M.D.   On: 08/11/2020 18:53   CT CHEST ABDOMEN PELVIS WO CONTRAST  Result Date: 07/24/2020 CLINICAL DATA:  Stage IV squamous cell right upper lobe lung cancer diagnosed in 2019 status post radiation therapy x 3 with liver metastases and ongoing palliative chemotherapy. Patient reports new posterior right chest wall pain and tenderness. No reported injury. EXAM: CT CHEST, ABDOMEN AND PELVIS WITHOUT CONTRAST TECHNIQUE: Multidetector CT imaging of the chest, abdomen and pelvis was performed following the standard protocol without IV contrast. COMPARISON:  04/10/2020 PET-CT. 03/11/2020 chest CT. 07/17/2017 CT abdomen/pelvis. FINDINGS: CT CHEST FINDINGS Cardiovascular: Normal heart size. No significant pericardial effusion/thickening. Three-vessel coronary  atherosclerosis. Atherosclerotic nonaneurysmal thoracic aorta. Normal caliber pulmonary arteries. Mediastinum/Nodes: No discrete thyroid nodules. Unremarkable esophagus. No axillary adenopathy. Mildly enlarged 1.1 cm short axis diameter AP window node (series 2/image 19), previously 1.3 cm on 04/10/2020 PET-CT, slightly decreased. No new pathologically enlarged mediastinal nodes. Enlarged 1.8 cm left hilar node (series 2/image 27), previously 2.1 cm, slightly decreased. No right hilar adenopathy. Lungs/Pleura: No pneumothorax. No pleural effusion. Mild centrilobular emphysema. No acute consolidative airspace disease. Stable sharply marginated dense triangular consolidation in the posterior right upper lobe with associated volume loss and distortion, compatible with radiation change. Stable sharply marginated irregular bandlike consolidation in the lingula with associated volume loss and distortion, compatible with radiation change. Clustered tiny superior segment right lower lobe solid pulmonary nodules measuring up to 3 mm (series 4/image 44), stable. No new significant pulmonary nodules. Musculoskeletal: No aggressive appearing focal osseous lesions. Chronic healed posterior right fifth rib deformity. No acute fracture in the chest. Marked thoracic spondylosis. CT ABDOMEN PELVIS FINDINGS Hepatobiliary: New hepatomegaly with numerous (greater than 10) ill-defined heterogeneous hypodense liver masses scattered throughout the liver, increased in size and number since 04/10/2020 PET-CT. Representative segment 2 left liver 4.7 cm mass (series 2/image 54), new. Representative segment 3 left liver 3.2 cm mass (series 2/image 67), increased from 2.0 cm. Representative central right liver 4.5 cm mass (series 2/image 49), increased from 3.7 cm. Representative peripheral right liver 2.4 cm mass (series 2/image 48), new. Normal gallbladder with no radiopaque cholelithiasis.  No biliary ductal dilatation. Pancreas: Normal, with  no mass or duct dilation. Spleen: Normal size. No mass. Adrenals/Urinary Tract: Normal adrenals. No renal stones. No hydronephrosis. No contour deforming renal masses. Small cystocele. Otherwise normal nondistended bladder. Stomach/Bowel: Normal non-distended stomach. Normal caliber small bowel with no small bowel wall thickening. Normal appendix. Marked sigmoid diverticulosis with no large bowel wall thickening or significant pericolonic fat stranding. Vascular/Lymphatic: Atherosclerotic abdominal aorta with 3.1 cm infrarenal abdominal aortic aneurysm status post aorto bi-iliac stent graft. No pathologically enlarged lymph nodes in the abdomen or pelvis. Reproductive: Grossly normal uterus.  No adnexal mass. Other: No pneumoperitoneum, ascites or focal fluid collection. Musculoskeletal: No aggressive appearing focal osseous lesions. Mild lumbar spondylosis. IMPRESSION: 1. Progression of widespread bulky liver metastatic disease with new hepatomegaly. 2. Left hilar and mediastinal adenopathy is slightly decreased. 3. Stable radiation change in the right upper and lingula, with no evidence of local tumor recurrence. 4. Aortic Atherosclerosis (ICD10-I70.0) and Emphysema (ICD10-J43.9). Electronically Signed   By: Ilona Sorrel M.D.   On: 07/24/2020 17:03    ASSESSMENT AND PLAN: JEVON LITTLEPAGE is a 75 y.o. female with   1) Metastatic lung squamous cell carcinoma Previous h/o right upper lobe squamous cell carcinoma of the lung. Stage I Presenting with a cavitary RUL mass.Biopsy proven to be squamous cell carcinoma. No overt mediastinal or hilar lymphadenopathy noted. PET/CT showed no overt metastatic disease CT head neg for mets. Patient declined MRI S/p SBRT 60 Gy in 5 fractions 09/01/17 to 09/10/17  CT Chest 10/22/17 shows improvement in the right upper lobe mass after [facility]  03/01/18 CT Chest revealed Continued reduction in spiculated posterior right upper lobe pulmonary nodule. 2. Left upper lobe 1.6  cm spiculated pulmonary nodule is increased in size and density, compatible with malignancy, either a second primary bronchogenic carcinoma or contralateral metastasis. 3. No thoracic adenopathy.  03/18/18 PET/CT revealed Intense metabolic activity associated with enlarging LEFT upper lobe pulmonary nodule is most suggestive primary bronchogenic Carcinoma. 2. Mild to moderate activity associated treated pulmonary nodule in the RIGHT upper lobe with associated moderate metabolic activity in adjacent chest wall favored post radiation change inflammation. 3. Stable additional RIGHT lower lobe pulmonary nodule. 4. No new pulmonary nodules evident. 5. No evidence of metastatic mediastinal adenopathy or distant disease.  07/12/18 CT Chest which revealed 2.2 x 1.2 cm irregular/spiculated posterior right upper lobe nodule, grossly unchanged. 5 x 10 mm left upper lobe nodule, decreased. Aortic Atherosclerosis and Emphysema.  05/16/2019 CT Chest Wo Contrast (Accession [id number]) which revealed "Radiation changes in the posterior right upper lobe and left upper lobe/lingula.10 mm lingular nodule, progressive, suspicious for recurrence."  09/07/2019 CT Chest ([id number]) revealed "1. Recently treated lingular pulmonary nodule is mildly decreased in size. 2. Masslike fibrosis in the posterior right upper lobe is mildly increased, equivocal for continued evolution of postradiation change versus early recurrence. 3. Otherwise no new or progressive metastatic disease in the chest."  04/10/2020 PET/CT ([id number]) revealed "1. Local hypermetabolic nodal metastatic recurrence in the prevascular nodal station and LEFT hilum. 2. Bands of fibrotic change and consolidation in LEFT upper lobe and RIGHT upper lobe with low metabolic activity are favored post therapy benign findings. 3. Unfortunately, distant metastatic disease with new multifocal hypermetabolic HEPATIC METASTASIS."   2) Now with Left upper lobe 1.6 cm  spiculated pulmonary nodule- increasing in size. Seen on the 03/18/18 PET/CT as noted above. Newly identified Left enlarging nodule is suspected to be a new primary, and would be considered a  new Stage I tumor S/p 54 Gy in 3 fractions 04/06/18 to 04/13/18  3) Dyspnea and rest and on minimal exertion. - much improved CT chest with no PE  significant COPD at baseline -  Needing 2-3 L of oxygen by nasal cannula in hospital.and is now off oxygen ECHO ef 35-40% -Pulmonary function testing -severe obstruction - optimize rx -noted to have systolic cardiomyopathy - mx Dr Ellyn Hack. -Continue follow up with Dr. Sherrilyn Rist in Pulmonology as an outpatient  4) Poor medical f/u.     Past Medical History:  Diagnosis Date   AAA (abdominal aortic aneurysm) ([place])    AAA (abdominal aortic aneurysm) without rupture ([place]) 04/10/2014   Abnormal echocardiogram 08/10/2017   Aftercare following surgery of the circulatory system 04/10/2014   Aftercare following surgery of the circulatory system, NEC 11/15/2012   Cavitating mass in right upper lung lobe 07/12/2017   Chest discomfort 08/10/2017   Counseling regarding advance care planning and goals of care 05/17/2020   Dilated cardiomyopathy ([place]) 07/17/2017   Echocardiogram July 17, 2017: EF 35-40%.  Septal mid and basal inferior wall hypokinesis.  Mildly dilated LV.  Mildly reduced EF.  GR 1 DD.  Calcified mitral annulus.     Diverticulosis    GERD (gastroesophageal reflux disease)    Healthcare maintenance 12/21/2017   Hiatal hernia    Hyperlipidemia 12/21/2017   Hypertension    Hypokalemia    Hyponatremia 07/12/2017   Hypoxia    Lumbar spondylolysis    Lung cancer ([place]) dx'd 2019   Lung tumor    Malignant neoplasm of upper lobe of left lung ([place]) 03/22/2018   Malignant neoplasm of upper lobe of right lung ([place]) 07/12/2017   Non-small cell lung cancer metastatic to liver ([place]) 05/17/2020   On home O2    2L N/C    Protein-calorie malnutrition ([place])    Shortness of breath    Tachycardia 07/12/2017   4) Protein calorie malnutrition -nutritional consultation  5) Heavy smoker 75-100 pk-yrs -Pt reported on 08/12/17 that she has completely quit smoking and plans to never smoke again.  -Pt has continued smoking cessation as of 03/08/18 and was encouraged to keep up her good work.  6) abdominal pain secondary to hepatomegaly/liver metastasis  PLAN: -BMET from this morning has been reviewed with worsening of her renal function -Discussed CT Chest/Abd/Pel wo contrast ([id number]) on 07/24/2020; tumor is progressing and not behaving as standard. The lack of response is either not responding to standard treatments and growing through primary treatments or if this is a tumor from the liver itself and so poorly differentiated. Repeat CT scan performed 08/06/2020 on admission appears to be stable. -The patient has become significantly weaker with significant decline in her performance status since our last visit with her a few days.  I again reviewed her previous discussions with the patient regarding palliative care and hospice.  I advised the patient that at this point, she is to week to proceed with any additional chemotherapy and that she should consider transitioning to hospice to focus on comfort.  The patient agrees with this approach.  She is hesitant to consider residential hospice because she wants to be able to see her dog.  We will reach out to the [facility] team to help [place] determine if she can go home with hospice versus residential hospice.  I am unsure if residential hospice will allow for her dog to visit. -The patient received a dose of oxycodone 5 mg during my initial visit  and when I returned about 30 minutes later, she was very comfortable from this dose of pain medication.  I have not made any changes to her pain medications at this time.  Recommend titrating pain medication for comfort.  I have offered  to change her oxycodone to a liquid formulation, but the patient has declined. -We will continue oral dexamethasone for now for capsular pain. -I have discontinued the patient's multivitamin because she cannot swallow it as well as her nutritional supplements because she does not like them. -I have offered to call the patient's daughter to give her an update but the patient has declined this.   LOS: 7 days   Mikey Bussing, DNP, AGPCNP-BC, AOCNP 08/13/20   ADDENDUM  .Patient was Personally and independently interviewed, examined and relevant elements of the history of present illness were reviewed in details and an assessment and plan was created. All elements of the patient's history of present illness , assessment and plan were discussed in details with Mikey Bussing, DNP, AGPCNP-BC, AOCNP. The above documentation reflects our combined findings assessment and plan.  Sullivan Lone MD MS

## 2020-08-13 NOTE — Progress Notes (Addendum)
PROGRESS NOTE   LITTLE BASHORE  DZH:[id number]    DOB: Oct 10, 1945    DOA: 08/06/2020  PCP: Elise Benne   I have briefly reviewed patients previous medical records in [facility].  Chief Complaint  Patient presents with  . Abdominal Pain    Brief Narrative:  75 year old female with history of non-small cell lung cancer with progressive liver metastasis, not responsive to first-line chemo + immunotherapy, follows with Dr. [name], oncology and waiting on mutational profile to see if there are any other targetable mutations for treatment, presented with 3 days of abdominal pain and initial concern for sepsis.  At this point, infectious etiology felt less likely and abdominal pain likely related to progressive liver mets.  Oncology consulted 2/10.  Patient was started on Decadron with transient improvement of abdominal pain, plan for discharge on 2/13 but started having recurrent abdominal pain.  Course then complicated by difficult to control pain and mental status changes likely related to opioids.  Ongoing functional decline.  Requested oncology to assist with further management i.e. discussing with patient, family regarding further course, whether she is appropriate for hospice etc.  Palliative care medicine on board for symptom management and goals of care discussions.   Assessment & Plan:  Principal Problem:   Abdominal pain Active Problems:   Dilated cardiomyopathy (HCC)   Non-small cell lung cancer metastatic to liver (HCC)   AKI (acute kidney injury) ([place])   CKD (chronic kidney disease), stage III (HCC)   Liver metastases (HCC)   Abdominal pain suspected due to progressive liver mets  CT abdomen pelvis without contrast 2/8: Stable appearing hepatic enlargement with underlying hepatic metastatic disease, new mild ascites not enough for safe paracentesis. I communicated with her primary oncologist on 2/9: Notes as documented above in brief narrative. He did initiate  [place] discussions and palliative care during the last visit. Supportive treatment of pain and initiated bowel regimen for constipation which may also help. No UTI symptoms, discontinued antibiotics.  Oncology consultation 2/10 appreciated: Reportedly had been started on dexamethasone 4 mg twice daily to help with capsular pain but patient had stopped due to sleep interference. They have resumed dexamethasone 4 mg daily at noontime and consulted palliative care for symptom management, goals of care.   Palliative care consultation 2/12 appreciated: DNR/DNI has been established.  Agree that her pain improvement is probably largely driven by restarting steroids.  Continue steroids at discharge and patient has been counseled to be compliant with these and this was discussed with patient's daughter as well.    Recommend continued GOC and palliative care conversations during follow-up with her oncologist.  On 2/13, abdominal pain had improved over the last 3 days prior, after speaking with patient and daughter, had completed her discharge paperwork. However when daughter came to pick patient up, she reported worsening abdominal pain and DC was canceled.  Almost all day on 2/14, patient remained lethargic, likely due to opioids and Ativan.  Opioids doses were down titrated (OxyIR dose reduced, IV Dilaudid discontinued)  KUB 2/13 without acute findings.  Difficult situation and that patient has ongoing cancer related abdominal pain.  However unable to uptitrate opioids without causing significant mental status changes/lethargy, drowsiness etc.  Her functional status is definitely down.  My discussed in detail with Dr. Rowe Pavy, PMT, may not be a candidate for fentanyl patch or MS Contin due to being opioid nave.  Communicated with her primary oncology team to see her and discuss with her and her  family regarding her sustained decline and further options i.e. hospice?Marland Kitchen  Await their input.  SIRS  Met  criteria on admission. Initially presumed to have UTI although she did not have any urinary symptoms. Low index of suspicion for SBP to given small volume of ascites.  Discontinued antibiotics. Sepsis ruled out.  Acute kidney injury complicating CKD stage IIIa, recurrent  Most likely due to malignancy related poor appetite and poor oral intake.     Last creatinine PTA on 07/26/2020: 1.96.  Presented with creatinine of 2.44.    Despite IV hydration, this peaked to 2.58.  Renal ultrasound was thereby obtained and unremarkable.    IV fluids were continued.  Creatinine finally improved to 1.96 on 2/13.    Recommend continued ad lib. oral hydration at discharge and avoid nephrotoxic's.  Follow BMP as outpatient at the cancer center.  Creatinine back up today from 1.96-2.68, likely due to inconsistent and poor oral intake.  Resume gentle IV fluids.  Hyperkalemia, recurrent  Potassium 5.7on 2/11. Provided dose of Lokelma. Low potassium diet.  Potassium back up from 4.7-5.4.  Repeat Lokelma and follow BMP in a.m.  May have to remain on standing dose of daily Lokelma.  Hyponatremia  Could be a combination of hypovolemia or SIADH related to underlying cancer.Resolved.  Constipation  Aggressive bowel regimen which may help with her abdominal pain as well.  Continue MiraLAX and senna and patient counseled regarding titration of these medications as needed if she develops diarrhea  Anemia of malignancy and cancer treatment  Stable  Non-small cell lung cancer with progressive liver meta stasis  Management as noted above. Mildly abnormal LFTs likely related to this.  Dilated cardiomyopathy, LVEF 46-95%, grade 1 diastolic dysfunction  Clinically euvolemic.  Body mass index is 25.96 kg/m.  Altered mental status/acute toxic encephalopathy  Likely multifactorial but mostly due to opioid.  Check ammonia level for possible hepatic encephalopathy.  Could also  have an element of uremia from AKI.  Management as noted above.  Mental status is somewhat better compared to yesterday.   DVT prophylaxis: heparin injection 5,000 Units Start: 08/06/20 2200     Code Status: DNR Family Communication: I discussed in detail with patient's daughter via phone on 2/15, updated care in great detail and answered all questions.  Advised her that patient is not doing well.  She was appreciative of the call. Disposition:  Status is: Inpatient  Remains inpatient appropriate because:Inpatient level of care appropriate due to severity of illness   Dispo: The patient is from: Home              Anticipated d/c is to: Home              Anticipated d/c date is: 08/13/2020              Patient currently is not medically stable to d/c.   Difficult to place patient No        Consultants:   Medical oncology PMT  Procedures:   None  Antimicrobials:    Anti-infectives (From admission, onward)   Start     Dose/Rate Route Frequency Ordered Stop   08/07/20 1400  cefTRIAXone (ROCEPHIN) 2 g in sodium chloride 0.9 % 100 mL IVPB  Status:  Discontinued        2 g 200 mL/hr over 30 Minutes Intravenous Every 24 hours 08/06/20 1931 08/07/20 0748   08/06/20 1345  cefTRIAXone (ROCEPHIN) 2 g in sodium chloride 0.9 % 100 mL IVPB  2 g 200 mL/hr over 30 Minutes Intravenous  Once 08/06/20 1333 08/06/20 1435   08/06/20 1345  metroNIDAZOLE (FLAGYL) IVPB 500 mg        500 mg 100 mL/hr over 60 Minutes Intravenous  Once 08/06/20 1333 08/06/20 1408        Subjective:  Slightly more alert and coherent than yesterday but still lethargic.  Answers more appropriately.  States that her abdominal pain is better but seems uncomfortable and vacillates between sitting up at edge of bed and laying down.  Just does not seem to be getting comfortable.  Objective:   Vitals:   08/12/20 1619 08/12/20 2019 08/12/20 2100 08/13/20 0605  BP: 130/75  134/89 128/73  Pulse: 96  94 95   Resp: 16  20 (!) 24  Temp: 97.8 F (36.6 C)  97.6 F (36.4 C) (!) 97.4 F (36.3 C)  TempSrc: Oral  Oral Oral  SpO2: 92% 92% 91% 94%  Weight:      Height:        General exam: Elderly female, moderately built, frail and chronically ill briefly seen sitting up at edge of bed then laid down in bed, keeps moving back and forth between these two positions. Respiratory system: Clear to auscultation.  No wheezing, rhonchi or crackles.  Appears to have DOE on minimal exertion, gets tachypneic. Cardiovascular system: S1 and S2 heard, RRR.  No JVD, murmurs or pedal edema. Gastrointestinal system: Abdomen with epigastric fullness and appreciate ill-defined mass with no tenderness, rigidity, guarding or rebound.  Not pulsatile. No other organomegaly or masses felt. Normal bowel sounds heard.  Today also appreciate?  Right upper quadrant small mass.  No acute findings noted. Central nervous system: More alert than yesterday but still lethargic.  Oriented x2.  Not fully coherent.  No focal deficits. Extremities: Symmetric 5 x 5 power. Skin: No rashes, lesions or ulcers Psychiatry: Judgement and insight impaired. Mood & affect appropriate.     Data Reviewed:   I have personally reviewed following labs and imaging studies   CBC: Recent Labs  Lab 08/06/20 1235 08/07/20 0332 08/08/20 0601  WBC 13.9* 12.9* 13.1*  NEUTROABS 12.4*  --   --   HGB 13.9 10.9* 11.8*  HCT 45.3 36.0 39.2  MCV 90.1 92.1 90.7  PLT 439* 313 665    Basic Metabolic Panel: Recent Labs  Lab 08/10/20 0607 08/11/20 0551 08/13/20 0731  NA 135 135 138  K 4.8 4.7 5.4*  CL 100 103 105  CO2 20* 21* 20*  GLUCOSE 127* 119* 96  BUN 59* 51* 64*  CREATININE 2.40* 1.96* 2.68*  CALCIUM 8.4* 8.4* 8.8*    Liver Function Tests: Recent Labs  Lab 08/07/20 0332 08/09/20 0546 08/10/20 0607  AST 85* 97* 104*  ALT 37 41 44  ALKPHOS 437* 332* 441*  BILITOT 1.1 1.3* 1.3*  PROT 5.7* 5.9* 5.5*  ALBUMIN 2.5* 2.6* 2.5*     CBG: No results for input(s): GLUCAP in the last 168 hours.  Microbiology Studies:   Recent Results (from the past 240 hour(s))  Blood culture (routine x 2)     Status: None   Collection Time: 08/06/20 12:35 PM   Specimen: BLOOD  Result Value Ref Range Status   Specimen Description   Final    BLOOD RIGHT ANTECUBITAL Performed at [facility], [place] [address] [place], [place] [address]    Special Requests   Final    BOTTLES DRAWN AEROBIC AND ANAEROBIC Blood Culture adequate volume  Performed at [facility], [place] [address] [place], [place] [address]    Culture   Final    NO GROWTH 5 DAYS Performed at [facility], [place] [address] [place], [place] [address]    Report Status 08/11/2020 FINAL  Final  Urine culture     Status: Abnormal   Collection Time: 08/06/20  3:35 PM   Specimen: Urine, Random  Result Value Ref Range Status   Specimen Description   Final    URINE, RANDOM Performed at [place] [address], [place], [place] [address]    Special Requests   Final    NONE Performed at [facility], [place] [address], [place], [place] [address]    Culture (A)  Final    <10,000 COLONIES/mL INSIGNIFICANT GROWTH Performed at [place] [address], [place], [place] [address]    Report Status 08/08/2020 FINAL  Final  Resp Panel by RT-PCR (Flu A&B, Covid) Nasopharyngeal Swab     Status: None   Collection Time: 08/06/20  5:28 PM   Specimen: Nasopharyngeal Swab; Nasopharyngeal(NP) swabs in vial transport medium  Result Value Ref Range Status   SARS Coronavirus 2 by RT PCR NEGATIVE NEGATIVE Final    Comment: (NOTE) SARS-CoV-2 target nucleic acids are NOT DETECTED.  The SARS-CoV-2 RNA is generally detectable in upper respiratory specimens during the acute phase of infection. The lowest concentration of SARS-CoV-2 viral copies this assay can detect is 138 copies/mL. A negative result does not  preclude SARS-Cov-2 infection and should not be used as the sole basis for treatment or other patient management decisions. A negative result may occur with  improper specimen collection/handling, submission of specimen other than nasopharyngeal swab, presence of viral mutation(s) within the areas targeted by this assay, and inadequate number of viral copies(<138 copies/mL). A negative result must be combined with clinical observations, patient history, and epidemiological information. The expected result is Negative.  Fact Sheet for Patients:  EntrepreneurPulse.com.au  Fact Sheet for Healthcare Providers:  IncredibleEmployment.be  This test is no t yet approved or cleared by the [place] FDA and  has been authorized for detection and/or diagnosis of SARS-CoV-2 by FDA under an Emergency Use Authorization (EUA). This EUA will remain  in effect (meaning this test can be used) for the duration of the COVID-19 declaration under Section 564(b)(1) of the Act, 21 U.S.C.section 360bbb-3(b)(1), unless the authorization is terminated  or revoked sooner.       Influenza A by PCR NEGATIVE NEGATIVE Final   Influenza B by PCR NEGATIVE NEGATIVE Final    Comment: (NOTE) The Xpert Xpress SARS-CoV-2/FLU/RSV plus assay is intended as an aid in the diagnosis of influenza from Nasopharyngeal swab specimens and should not be used as a sole basis for treatment. Nasal washings and aspirates are unacceptable for Xpert Xpress SARS-CoV-2/FLU/RSV testing.  Fact Sheet for Patients: EntrepreneurPulse.com.au  Fact Sheet for Healthcare Providers: IncredibleEmployment.be  This test is not yet approved or cleared by the [place] FDA and has been authorized for detection and/or diagnosis of SARS-CoV-2 by FDA under an Emergency Use Authorization (EUA). This EUA will remain in effect (meaning this test can be used) for the  duration of the COVID-19 declaration under Section 564(b)(1) of the Act, 21 U.S.C. section 360bbb-3(b)(1), unless the authorization is terminated or revoked.  Performed at [facility], [place] [address] [place], [place] [address]      Radiology Studies:  DG Abd Portable  1V  Result Date: 08/11/2020 CLINICAL DATA:  Uncontrolled abdominal pain. EXAM: PORTABLE ABDOMEN - 1 VIEW COMPARISON:  10/01/2010 FINDINGS: The liver is diffusely enlarged. The bowel gas pattern is nonobstructive. In abdominal aortic endograft is again noted. There are no radiopaque kidney stones. IMPRESSION: 1. Nonobstructive bowel gas pattern. 2. Hepatomegaly. Electronically Signed   By: Constance Holster M.D.   On: 08/11/2020 18:53     Scheduled Meds:   . (feeding supplement) PROSource Plus  30 mL Oral BID BM  . budesonide  0.5 mg Nebulization BID  . dexamethasone  4 mg Oral Daily  . feeding supplement  237 mL Oral BID BM  . heparin  5,000 Units Subcutaneous Q8H  . metoprolol succinate  50 mg Oral Daily  . multivitamin with minerals  1 tablet Oral Daily  . pantoprazole  40 mg Oral Daily  . polyethylene glycol  17 g Oral BID  . senna  2 tablet Oral Daily  . sodium chloride flush  3 mL Intravenous Q12H    Continuous Infusions:      LOS: 7 days     Vernell Leep, MD, [place], [facility]. [facility]    To contact the attending provider between 7A-7P or the covering provider during after hours 7P-7A, please log into the web site [email or web address] and access using universal [place] password for that web site. If you do not have the password, please call the hospital operator.  08/13/2020, 11:59 AM

## 2020-08-13 NOTE — Progress Notes (Signed)
PMT no charge note.  Received call from nursing colleague regarding patient's daughter arriving at bedside and not in favor of the patient's DNR status. Patient wants to go along with what her daughter wants.   Arrived at bedside, patient's daughter not at bedside, nursing staff discussing with patient. Patient in bed, appears fatigued and states that she doesn't want to talk about this. Patient wishes to go along with full code, in line with her daughter's wishes. Daughter reportedly agreeable to hospice services at home.   PMT will follow up on 08-14-20, ok to consider full code and discharge home with hospice, would recommend home hospice staff to continue empathic conversations with patient and family at home in the patient's familiar surroundings regarding further discussions regarding dnr and comfort measures.   No charge.   Loistine Chance MD [place] palliative.

## 2020-08-13 NOTE — Progress Notes (Signed)
Patients daughter called out of room stating that they needed to see someone immediately. CN entered room and daughter handed nurse DNR bracelet that was on patient. Daughter stated that she does not need that. Daughter went on to say that if "something happens to her heart we need to fix that and that the only thing that will kill her mother is the cancer". Inquired with patient about previous conversation she had with oncology regarding hospice and statement patient made to CN earlier in the day about wanting hospice care. Patient stated that we should do what the daughter wants. MD paged.  MD spoke with daughter. Will continue DNR status at this time.

## 2020-08-13 NOTE — TOC Progression Note (Signed)
Transition of Care [facility]) - Progression Note    Patient Details  Name: Chelsea Baker MRN: [id number] Date of Birth: May 28, 1946  [facility] CM/SW Contact  Joaquin Courts, RN Phone Number: 08/13/2020, 2:04 PM  Clinical Narrative:    CM noted consult for home with hospice services.  Spoke with patient's daughter and offered hospice choice.  Daughter selects hospice of the [place], rep Webb Silversmith given referral.  Per daughter, patient lives with her and while she is not there all the time, other family members are available to assist and check on patient.  Patient does not have any equipment in the home at this time.  Cheri to contact daughter and discuss equipment needs, including the possibility of a hospital bed and when this equipment can be delivered.    Expected Discharge Plan: Home w Hospice Care Barriers to Discharge: Continued Medical Work up  Expected Discharge Plan and Services Expected Discharge Plan: [place]   Discharge Planning Services: CM Consult Post Acute Care Choice: Hospice Living arrangements for the past 2 months: Single Family Home Expected Discharge Date: 08/11/20                         HH Arranged: PT HH Agency: [facility] Date [place]: 08/09/20 Time HH Agency Contacted: 1941 Representative spoke with at [place]: [place] ([place]) Interventions    Readmission Risk Interventions Readmission Risk Prevention Plan 08/09/2020  Transportation Screening Complete  PCP or Specialist Appt within 3-5 Days Complete  HRI or Home Care Consult Complete  Palliative Care Screening Complete  Medication Review (RN Care Manager) Complete  Some recent data might be hidden

## 2020-08-14 ENCOUNTER — Encounter (HOSPITAL_COMMUNITY): Payer: Self-pay | Admitting: Hematology

## 2020-08-14 LAB — CBC
HCT: 39.3 % (ref 36.0–46.0)
Hemoglobin: 12.1 g/dL (ref 12.0–15.0)
MCH: 27.4 pg (ref 26.0–34.0)
MCHC: 30.8 g/dL (ref 30.0–36.0)
MCV: 89.1 fL (ref 80.0–100.0)
Platelets: 197 10*3/uL (ref 150–400)
RBC: 4.41 MIL/uL (ref 3.87–5.11)
RDW: 18.6 % — ABNORMAL HIGH (ref 11.5–15.5)
WBC: 13.2 10*3/uL — ABNORMAL HIGH (ref 4.0–10.5)
nRBC: 0 % (ref 0.0–0.2)

## 2020-08-14 LAB — COMPREHENSIVE METABOLIC PANEL
ALT: 50 U/L — ABNORMAL HIGH (ref 0–44)
AST: 163 U/L — ABNORMAL HIGH (ref 15–41)
Albumin: 2.5 g/dL — ABNORMAL LOW (ref 3.5–5.0)
Alkaline Phosphatase: 400 U/L — ABNORMAL HIGH (ref 38–126)
Anion gap: 12 (ref 5–15)
BUN: 68 mg/dL — ABNORMAL HIGH (ref 8–23)
CO2: 19 mmol/L — ABNORMAL LOW (ref 22–32)
Calcium: 8.7 mg/dL — ABNORMAL LOW (ref 8.9–10.3)
Chloride: 105 mmol/L (ref 98–111)
Creatinine, Ser: 2.93 mg/dL — ABNORMAL HIGH (ref 0.44–1.00)
GFR, Estimated: 16 mL/min — ABNORMAL LOW (ref >60)
Glucose, Bld: 97 mg/dL (ref 70–99)
Potassium: 5.6 mmol/L — ABNORMAL HIGH (ref 3.5–5.1)
Sodium: 136 mmol/L (ref 135–145)
Total Bilirubin: 1.8 mg/dL — ABNORMAL HIGH (ref 0.3–1.2)
Total Protein: 5.5 g/dL — ABNORMAL LOW (ref 6.5–8.1)

## 2020-08-14 MED ORDER — SODIUM CHLORIDE 0.9 % IV SOLN: INTRAVENOUS | Status: DC

## 2020-08-14 MED ORDER — SODIUM ZIRCONIUM CYCLOSILICATE 10 G PO PACK
10.0000 g | PACK | Freq: Two times a day (BID) | ORAL | Status: DC
  Filled 2020-08-14: qty 1

## 2020-08-14 NOTE — Progress Notes (Signed)
Daily Progress Note   Patient Name: Chelsea Baker       Date: 08/14/2020 DOB: September 30, 1945  Age: 75 y.o. MRN#: [id number] Attending Physician: Deatra James, MD Primary Care Physician: Elise Benne Admit Date: 08/06/2020  Reason for Consultation/Follow-up: Non pain symptom management and Pain control  Subjective: Patient is resting in bed, complains of pain in her R side abdomen, wants pain medication. Patient does not wish to discuss further goals of care, asks for pain medication to be given, endorses home with hospice discharge plan.     Length of Stay: 8  Current Medications: Scheduled Meds:  . budesonide  0.5 mg Nebulization BID  . dexamethasone  4 mg Oral Daily  . heparin  5,000 Units Subcutaneous Q8H  . metoprolol succinate  50 mg Oral Daily  . pantoprazole  40 mg Oral Daily  . polyethylene glycol  17 g Oral BID  . senna  2 tablet Oral Daily  . sodium chloride flush  3 mL Intravenous Q12H  . sodium zirconium cyclosilicate  10 g Oral BID    Continuous Infusions: . sodium chloride 75 mL/hr at 08/14/20 1147    PRN Meds: albuterol, bisacodyl, ondansetron **OR** ondansetron (ZOFRAN) IV, oxyCODONE, prochlorperazine, sodium chloride, sodium phosphate  Physical Exam     General: Alert, awake, in no acute distress. Frail and chronically ill appearing.    HEENT: No bruits, no goiter, no JVD Heart: Regular rate and rhythm. No murmur appreciated. Lungs: Good air movement, clear Abdomen: Soft, nontender, RUQ mass, positive bowel sounds.  Ext: No significant edema Skin: Warm and dry Neuro: Grossly intact, nonfocal.      Vital Signs: BP 133/77 (BP Location: Left Arm)   Pulse 95   Temp 98 F (36.7 C) (Oral)   Resp 20   Ht 5' (1.524 m)   Wt 60.3 kg   SpO2 93%    BMI 25.96 kg/m  SpO2: SpO2: 93 % O2 Device: O2 Device: Room Air O2 Flow Rate: O2 Flow Rate (L/min): 1 L/min  Intake/output summary:   Intake/Output Summary (Last 24 hours) at 08/14/2020 1341 Last data filed at 08/14/2020 0600 Gross per 24 hour  Intake 1370.01 ml  Output --  Net 1370.01 ml   LBM: Last BM Date: 08/10/20 Baseline Weight: Weight: 60.3 kg Most recent weight: Weight: 60.3  kg       Palliative Assessment/Data:    Flowsheet Rows   Flowsheet Row Most Recent Value  Intake Tab   Referral Department Hospitalist  Unit at Time of Referral Med/Surg Unit  Clinical Assessment   Psychosocial & Spiritual Assessment   Palliative Care Outcomes       Patient Active Problem List   Diagnosis Date Noted  . Palliative care by specialist   . Neoplasm related pain   . Liver metastases ([place])   . Abdominal pain 08/06/2020  . AKI (acute kidney injury) ([place]) 08/06/2020  . CKD (chronic kidney disease), stage III ([place]) 08/06/2020  . On home O2   . Lung tumor   . Lung cancer ([place])   . Lumbar spondylolysis   . Hypokalemia   . Hiatal hernia   . GERD (gastroesophageal reflux disease)   . Diverticulosis   . AAA (abdominal aortic aneurysm) ([place])   . Non-small cell lung cancer metastatic to liver ([place]) 05/17/2020  . Counseling regarding advance care planning and goals of care 05/17/2020  . Malignant neoplasm of upper lobe of left lung ([place]) 03/22/2018  . Healthcare maintenance 12/21/2017  . Hyperlipidemia 12/21/2017  . Hypertension 08/25/2017  . Chest discomfort 08/10/2017  . Abnormal echocardiogram 08/10/2017  . Hypoxia   . Dilated cardiomyopathy ([place]) 07/17/2017  . Shortness of breath   . Protein-calorie malnutrition ([place])   . Malignant neoplasm of upper lobe of right lung ([place]) 07/12/2017  . Tachycardia 07/12/2017  . Hyponatremia 07/12/2017  . Cavitating mass in right upper lung lobe 07/12/2017  . AAA (abdominal aortic aneurysm) without rupture ([place]) 04/10/2014  .  Aftercare following surgery of the circulatory system 04/10/2014  . Aftercare following surgery of the circulatory system, [place] 11/15/2012    Palliative Care Assessment & Plan   Patient Profile: 75 y.o. female  with past medical history of NSCLC with progressive liver mets admitted on 08/06/2020 with worsened abdominal pain.  Initial concern for infection, but infectious etiology less likely and increased pain likely due to metastatic disease.  Palliative consulted for assistance with pain management.   Recommendations/Plan:  recommend home with hospice today Continue current pain and non pain symptom management options.  Continue efforts to provide patient's goal concordant care, patient wishes to be home, is accepting of hospice support.     Code Status:    Code Status Orders  (From admission, onward)         Start     Ordered   08/06/20 1932  Do not attempt resuscitation (DNR)  Continuous       Question Answer Comment  In the event of cardiac or respiratory ARREST Do not call a "code blue"   In the event of cardiac or respiratory ARREST Do not perform Intubation, CPR, defibrillation or ACLS   In the event of cardiac or respiratory ARREST Use medication by any route, position, wound care, and other measures to relive pain and suffering. May use oxygen, suction and manual treatment of airway obstruction as needed for comfort.      08/06/20 1931        Code Status History    Date Active Date Inactive Code Status Order ID Comments User Context   07/13/2017 0047 07/18/2017 1838 Full Code [id number]  Jani Gravel, MD Inpatient   Advance Care Planning Activity      Discharge Planning:  Home with hospice.   Care plan was discussed with patient and [facility] MD.   Thank you for allowing the  Palliative Medicine Team to assist in the care of this patient.   Time In: 11 Time Out: 11.15 Total Time 15 Prolonged Time Billed No      Greater than 50%  of this time was spent counseling and  coordinating care related to the above assessment and plan.  Loistine Chance, MD  Please contact Palliative Medicine Team phone at [phone] for questions and concerns.

## 2020-08-14 NOTE — Progress Notes (Signed)
   I have spoke to the pt's daughter Danae Chen  regarding hospice care yesterday afternoon.  She does appear to be in agreement.   Danae Chen stated she was at the pt's bedside at that time and was hoping to hear from the pt's oncologist regarding any further treatment options before she would accept all of this. Danae Chen states that th e pt's regular MD had told her that there were no more options for treatment but she felt that should come from the oncologist instead.   I reached out to the [facility] relaying this above info.  (I did note that the pt herself had asked oncologist yesterday that he did not need to discuss with the daughter when he offered to call her.)   I did go ahead and order equipment yesterday to be delivered today. I have ordered hospital bed, oxygen, BSC, walker, wheelchair and shower chair.  I have reached out this am to the Daughter Danae Chen to see if she has heard from the equipment company on time frame of delivery. She did not answer the phone I have left a VM for her to return the call.  I have updated Randall Hiss with Transitions of care today.   Webb Silversmith RN BSN [facility]

## 2020-08-14 NOTE — Plan of Care (Signed)

## 2020-08-14 NOTE — Progress Notes (Signed)
Physician Discharge Summary [facility]    Patient: Chelsea Baker                   Admit date: 08/06/2020   DOB: 10-13-1945             Discharge date:08/14/2020/11:36 AM GUR:[id number]                          PCP: Mackie Pai, PA-C  Disposition: HOME with Hospice   Recommendations for Outpatient Follow-up:   . Follow up: in 1 day with HOSPICE   Discharge Condition: Stable   Code Status:   Code Status: Full Code  Diet recommendation: Regular healthy diet   Addendum:  The patient was seen and examined this morning, refusing oral medication based on consistent.   No changes to discharge planning still to be discharged home with hospice.  Daughter has agreed to the plan. (Hospice RN --Chelsea Silversmith RN  --- discussed that with daughter in detail yesterday 08/14/20)  Apparently daughter has requested patient to be full code yesterday  Plan was discussed in detail with palliative care physician Dr. Rowe Pavy.   Discharge Diagnoses:    Principal Problem:   Abdominal pain Active Problems:   Dilated cardiomyopathy (HCC)   Non-small cell lung cancer metastatic to liver (HCC)   AKI (acute kidney injury) ([place])   CKD (chronic kidney disease), stage III ([place])   Liver metastases ([employer])   Palliative care by specialist   Neoplasm related pain   History of Present Illness/ Hospital Course Chelsea Baker Summary:   Abdominal pain suspected due to progressive liver mets CT abdomen pelvis without contrast 2/8: Stable appearing hepatic enlargement with underlying hepatic metastatic disease, new mild ascites not enough for safe paracentesis. I communicated with her primary oncologist on 2/9: Notes as documented above in brief narrative. He did initiate [facility] discussions and palliative care during the last visit. Supportive treatment of pain and initiated bowel regimen for constipation which may also help. No UTI symptoms, discontinued antibiotics. Oncology consultation 2/10  appreciated: Reportedly had been started on dexamethasone 4 mg twice daily to help with capsular pain but patient had stopped due to sleep interference. They have resumed dexamethasone 4 mg daily at noontime and have consulted palliative care for symptom management, goals of care.  Abdominal pain has consistently improved for the last 3 days where she has reported pain is 2/10 in severity.  She has barely used any pain medicines except her dose of oxycodone this morning.  Reports to be eating okay but does not like the hospital food.  States that she had a large BM today. Palliative care consultation 2/12 appreciated: DNR/DNI has been established.  Agree that her pain improvement is probably largely driven by restarting steroids.  Continue steroids at discharge and patient has been counseled to be compliant with these and this was discussed with patient's daughter as well.  Since she has not used much of the opioid pain medicines, left her on her prior home tramadol for breakthrough pain and this can be followed up at the cancer center with her oncologist.  Recommend continued [place] and palliative care conversations during follow-up with her oncologist. Improved and stable.  SIRS Met criteria on admission. Initially presumed to have UTI although she did not have any urinary symptoms. Low index of suspicion for SBP to given small volume of ascites. Discontinued antibiotics. Sepsis ruled out.... Hemodynamically stable now  Acute kidney injury complicating CKD  stage IIIa Most likely due to malignancy related poor appetite and poor oral intake.   Last creatinine PTA on 07/26/2020: 1.96.  Presented with creatinine of 2.44.  Despite IV hydration, this peaked to 2.58.  Renal ultrasound was thereby obtained and unremarkable.  IV fluids were continued.  Creatinine has finally improved to 1.96.  Recommend continued ad lib. oral hydration at discharge and avoid nephrotoxic's.  Follow BMP as outpatient at the  cancer center.  Hyperkalemia Potassium 5.7on 2/11. Provided dose of Lokelma. Low potassium diet.Resolved.  Monitor BMP as outpatient.... Elevated at 5.6, patient refused to take her medication of Lokema  Hyponatremia Could be a combination of hypovolemia or SIADH related to underlying cancer.Normalized.  Constipation Aggressive bowel regimen which may help with her abdominal pain as well.Now having BMs yesterday and today.  Continue MiraLAX and senna and patient counseled regarding titration of these medications as needed if she develops diarrhea -Improved  Anemia of malignancy and cancer treatment Stable  Non-small cell lung cancer with progressive liver meta stasis Management as noted above. Mildly abnormal LFTs likely related to this.  Dilated cardiomyopathy, LVEF 62-83%, grade 1 diastolic dysfunction Clinically euvolemic.  Body mass index is 25.96 kg/m.     Consultants:  Medical oncology PMT  Procedures:  None   Nutritional status:  Nutrition Problem: Increased nutrient needs Etiology: acute illness,cancer and cancer related treatments,chronic illness Signs/Symptoms: estimated needs Interventions: Prostat,Ensure Enlive (each supplement provides 350kcal and 20 grams of protein),MVI  The patient's BMI is: Body mass index is 25.96 kg/m. I agree with the assessment and plan as outlined below: Nutrition Status: Nutrition Problem: Increased nutrient needs Etiology: acute illness,cancer and cancer related treatments,chronic illness Signs/Symptoms: estimated needs Interventions: Prostat,Ensure Enlive (each supplement provides 350kcal and 20 grams of protein),MVI     SKIN assessment: I agree with skin assessment and plan as outlined below:    Risk of unplanned readmission Score:     Discharge Instructions:   Discharge Instructions    Activity as tolerated - No restrictions   Complete by: As directed    Call MD for:  difficulty  breathing, headache or visual disturbances   Complete by: As directed    Call MD for:  extreme fatigue   Complete by: As directed    Call MD for:  persistant dizziness or light-headedness   Complete by: As directed    Call MD for:  persistant nausea and vomiting   Complete by: As directed    Call MD for:  severe uncontrolled pain   Complete by: As directed    Call MD for:  temperature >100.4   Complete by: As directed    Diet - low sodium heart healthy   Complete by: As directed    Discharge instructions   Complete by: As directed    F/up with HOSPICE   Increase activity slowly   Complete by: As directed    Increase activity slowly   Complete by: As directed        Medication List    STOP taking these medications   acyclovir 400 MG tablet Commonly known as: ZOVIRAX   fexofenadine 180 MG tablet Commonly known as: ALLEGRA   ipratropium-albuterol 0.5-2.5 (3) MG/3ML Soln Commonly known as: DUONEB     TAKE these medications   (feeding supplement) PROSource Plus liquid Take 30 mLs by mouth 2 (two) times daily between meals.   feeding supplement Liqd Take 237 mLs by mouth 2 (two) times daily between meals.   acetaminophen 500 MG  tablet Commonly known as: TYLENOL Take 500 mg by mouth every 6 (six) hours as needed for moderate pain.   albuterol 108 (90 Base) MCG/ACT inhaler Commonly known as: VENTOLIN HFA INHALE 2 PUFFS INTO THE LUNGS EVERY 4 (FOUR) HOURS AS NEEDED FOR WHEEZING OR SHORTNESS OF BREATH.   Baclofen 5 MG Tabs Take 1 tablet by mouth 2 (two) times daily as needed. What changed: reasons to take this   budesonide 0.5 MG/2ML nebulizer solution Commonly known as: Pulmicort Take 2 mLs (0.5 mg total) by nebulization 2 (two) times daily. What changed:   when to take this  reasons to take this   dexamethasone 4 MG tablet Commonly known as: DECADRON Take 1 tablet (4 mg total) by mouth daily. What changed:   when to take this  additional instructions    LORazepam 0.5 MG tablet Commonly known as: Ativan Take 1 tablet (0.5 mg total) by mouth every 6 (six) hours as needed for anxiety.   metoprolol succinate 50 MG 24 hr tablet Commonly known as: TOPROL-XL 1 tab po q day What changed:   how much to take  how to take this  when to take this  additional instructions   multivitamin with minerals Tabs tablet Take 1 tablet by mouth daily.   ondansetron 8 MG tablet Commonly known as: Zofran Take 1 tablet (8 mg total) by mouth 2 (two) times daily as needed for refractory nausea / vomiting. Start on day 3 after carboplatin chemo. What changed: additional instructions   pantoprazole 40 MG tablet Commonly known as: PROTONIX Take 1 tablet (40 mg total) by mouth daily.   polyethylene glycol 17 g packet Commonly known as: MIRALAX / GLYCOLAX Take 17 g by mouth 2 (two) times daily.   prochlorperazine 10 MG tablet Commonly known as: COMPAZINE Take 1 tablet (10 mg total) by mouth every 6 (six) hours as needed (Nausea or vomiting). What changed: reasons to take this   senna 8.6 MG Tabs tablet Commonly known as: SENOKOT Take 2 tablets (17.2 mg total) by mouth daily. If diarrhea, temporarily hold.   sodium chloride 0.65 % Soln nasal spray Commonly known as: OCEAN Place 1 spray into both nostrils daily as needed for congestion.   traMADol 50 MG tablet Commonly known as: ULTRAM Take 1 tablet (50 mg total) by mouth every 6 (six) hours as needed. What changed: reasons to take this   VISINE EXTRA OP Place 1 drop into both eyes daily as needed (dry eyes).       Follow-up Information    Saguier, Iris Pert. Schedule an appointment as soon as possible for a visit in 1 week(s).   Specialties: Internal Medicine, Family Medicine Why: To be seen with repeat labs (CBC & CMP). Contact information: [address] [place] [address] [phone]        [name], MD. Schedule an appointment as soon as  possible for a visit in 1 week(s).   Specialties: Hematology, Oncology Contact information: [place] [address] [phone]              No Known Allergies   Procedures /Studies:   CT Abdomen Pelvis Wo Contrast  Result Date: 08/06/2020 CLINICAL DATA:  Abdominal pain and distension EXAM: CT ABDOMEN AND PELVIS WITHOUT CONTRAST TECHNIQUE: Multidetector CT imaging of the abdomen and pelvis was performed following the standard protocol without IV contrast. COMPARISON:  07/24/2020 FINDINGS: Lower chest: No acute abnormality. Hepatobiliary: Multiple hypodensities are noted  scattered throughout the liver consistent with the metastatic lesions seen previously. The overall appearance is stable from the prior exam. Gallbladder is partially distended. Pancreas: Unremarkable. No pancreatic ductal dilatation or surrounding inflammatory changes. Spleen: Normal in size without focal abnormality. Adrenals/Urinary Tract: Adrenal glands are within normal limits. Kidneys demonstrate a normal appearance bilaterally. Tiny nonobstructing right renal stone is noted in the lower pole. No obstructive changes are seen. The bladder is well distended. Stomach/Bowel: Scattered diverticular change of the colon is noted without evidence of diverticulitis. No obstructive changes are seen. The appendix is within normal limits. Small bowel and stomach are unremarkable. Vascular/Lymphatic: Abdominal aorta demonstrates atherosclerotic calcification as well as a stent graft in satisfactory position. No aneurysmal dilatation is seen. No significant lymphadenopathy is noted. Reproductive: Uterus and bilateral adnexa are unremarkable. Other: Mild free fluid is noted within the pelvis slightly increased when compared with the prior exam. Mild fluid is noted along the liver is well. Musculoskeletal: Degenerative changes of lumbar spine are noted. IMPRESSION: Stable appearing hepatic enlargement with underlying  hepatic metastatic disease. New mild ascites not significant to allow for safe paracentesis. Tiny nonobstructing right renal stone. Aortic stent graft in satisfactory position. No aneurysmal dilatation is seen. Diverticulosis without diverticulitis. No obstructive changes are seen. Electronically Signed   By: Inez Catalina M.D.   On: 08/06/2020 14:52   US RENAL  Result Date: 08/09/2020 CLINICAL DATA:  75 year old female with abdominal pain. Metastatic lung cancer. Renal insufficiency. EXAM: RENAL / URINARY TRACT ULTRASOUND COMPLETE COMPARISON:  Noncontrast CT Abdomen and Pelvis 08/06/2020, and earlier. FINDINGS: Right Kidney: Renal measurements: 8.7 x 3.7 x 3.7 cm = volume: 62 mL. Echogenicity within normal limits. No mass or hydronephrosis visualized. Left Kidney: Renal measurements: 9.3 x 4.7 x 4.8 cm = volume: 109 mL. Echogenicity within normal limits. No mass or hydronephrosis visualized. Bladder: Appears normal for degree of bladder distention. Other: Heterogeneous hepatic echotexture compatible with known liver metastases. IMPRESSION: No acute renal finding. Diminutive but otherwise negative ultrasound appearance of the kidneys. Electronically Signed   By: Genevie Ann M.D.   On: 08/09/2020 07:44   DG CHEST PORT 1 VIEW  Result Date: 08/09/2020 CLINICAL DATA:  Shortness of breath EXAM: PORTABLE CHEST 1 VIEW COMPARISON:  08/06/2020 FINDINGS: Ill-defined opacities are again identified in the upper right lung and mid left lung with architectural distortion previously characterized on chest CT. Unchanged ill-defined density at the left lung base likely reflecting nipple shadow. Stable cardiomediastinal contours. No pleural effusion or pneumothorax. IMPRESSION: No new finding since 08/06/2020. Electronically Signed   By: Macy Mis M.D.   On: 08/09/2020 16:55   DG Chest Port 1 View  Result Date: 08/06/2020 CLINICAL DATA:  Sepsis, history of lung liver cancer, generalized abdominal pain for 3 days EXAM:  PORTABLE CHEST 1 VIEW COMPARISON:  CT 07/24/2020 FINDINGS: Persistent regions of architectural distortion in the right upper lobe and left mid lung may correspond with areas of previously treated malignancy. No superimposed consolidative process or edema is seen. Likely nipple shadow projecting over the left lung base. No pneumothorax or effusion. Stable cardiomediastinal contours with a calcified aorta. No acute osseous or soft tissue abnormality. Telemetry leads overlie the chest. IMPRESSION: Persistent regions of architectural distortion in the right upper lobe and left mid lung may correspond with areas of previously treated malignancy. No acute cardiopulmonary abnormality. Probable nipple shadow in the left lung base. If there is clinical concern, could consider repeat imaging with nipple markers. Electronically Signed   By:  Lovena Le M.D.   On: 08/06/2020 16:19   DG Abd Portable 1V  Result Date: 08/11/2020 CLINICAL DATA:  Uncontrolled abdominal pain. EXAM: PORTABLE ABDOMEN - 1 VIEW COMPARISON:  10/01/2010 FINDINGS: The liver is diffusely enlarged. The bowel gas pattern is nonobstructive. In abdominal aortic endograft is again noted. There are no radiopaque kidney stones. IMPRESSION: 1. Nonobstructive bowel gas pattern. 2. Hepatomegaly. Electronically Signed   By: Constance Holster M.D.   On: 08/11/2020 18:53   CT CHEST ABDOMEN PELVIS WO CONTRAST  Result Date: 07/24/2020 CLINICAL DATA:  Stage IV squamous cell right upper lobe lung cancer diagnosed in 2019 status post radiation therapy x 3 with liver metastases and ongoing palliative chemotherapy. Patient reports new posterior right chest wall pain and tenderness. No reported injury. EXAM: CT CHEST, ABDOMEN AND PELVIS WITHOUT CONTRAST TECHNIQUE: Multidetector CT imaging of the chest, abdomen and pelvis was performed following the standard protocol without IV contrast. COMPARISON:  04/10/2020 PET-CT. 03/11/2020 chest CT. 07/17/2017 CT abdomen/pelvis.  FINDINGS: CT CHEST FINDINGS Cardiovascular: Normal heart size. No significant pericardial effusion/thickening. Three-vessel coronary atherosclerosis. Atherosclerotic nonaneurysmal thoracic aorta. Normal caliber pulmonary arteries. Mediastinum/Nodes: No discrete thyroid nodules. Unremarkable esophagus. No axillary adenopathy. Mildly enlarged 1.1 cm short axis diameter AP window node (series 2/image 19), previously 1.3 cm on 04/10/2020 PET-CT, slightly decreased. No new pathologically enlarged mediastinal nodes. Enlarged 1.8 cm left hilar node (series 2/image 27), previously 2.1 cm, slightly decreased. No right hilar adenopathy. Lungs/Pleura: No pneumothorax. No pleural effusion. Mild centrilobular emphysema. No acute consolidative airspace disease. Stable sharply marginated dense triangular consolidation in the posterior right upper lobe with associated volume loss and distortion, compatible with radiation change. Stable sharply marginated irregular bandlike consolidation in the lingula with associated volume loss and distortion, compatible with radiation change. Clustered tiny superior segment right lower lobe solid pulmonary nodules measuring up to 3 mm (series 4/image 44), stable. No new significant pulmonary nodules. Musculoskeletal: No aggressive appearing focal osseous lesions. Chronic healed posterior right fifth rib deformity. No acute fracture in the chest. Marked thoracic spondylosis. CT ABDOMEN PELVIS FINDINGS Hepatobiliary: New hepatomegaly with numerous (greater than 10) ill-defined heterogeneous hypodense liver masses scattered throughout the liver, increased in size and number since 04/10/2020 PET-CT. Representative segment 2 left liver 4.7 cm mass (series 2/image 54), new. Representative segment 3 left liver 3.2 cm mass (series 2/image 67), increased from 2.0 cm. Representative central right liver 4.5 cm mass (series 2/image 49), increased from 3.7 cm. Representative peripheral right liver 2.4 cm mass  (series 2/image 48), new. Normal gallbladder with no radiopaque cholelithiasis. No biliary ductal dilatation. Pancreas: Normal, with no mass or duct dilation. Spleen: Normal size. No mass. Adrenals/Urinary Tract: Normal adrenals. No renal stones. No hydronephrosis. No contour deforming renal masses. Small cystocele. Otherwise normal nondistended bladder. Stomach/Bowel: Normal non-distended stomach. Normal caliber small bowel with no small bowel wall thickening. Normal appendix. Marked sigmoid diverticulosis with no large bowel wall thickening or significant pericolonic fat stranding. Vascular/Lymphatic: Atherosclerotic abdominal aorta with 3.1 cm infrarenal abdominal aortic aneurysm status post aorto bi-iliac stent graft. No pathologically enlarged lymph nodes in the abdomen or pelvis. Reproductive: Grossly normal uterus.  No adnexal mass. Other: No pneumoperitoneum, ascites or focal fluid collection. Musculoskeletal: No aggressive appearing focal osseous lesions. Mild lumbar spondylosis. IMPRESSION: 1. Progression of widespread bulky liver metastatic disease with new hepatomegaly. 2. Left hilar and mediastinal adenopathy is slightly decreased. 3. Stable radiation change in the right upper and lingula, with no evidence of local tumor recurrence. 4. Aortic  Atherosclerosis (ICD10-I70.0) and Emphysema (ICD10-J43.9). Electronically Signed   By: Ilona Sorrel M.D.   On: 07/24/2020 17:03     Subjective:   Patient was seen and examined 08/14/2020, 11:36 AM Patient stable today. No acute distress.  No issues overnight Stable for discharge.  Discharge Exam:    Vitals:   08/13/20 1409 08/13/20 2038 08/13/20 2158 08/14/20 0613  BP: 124/71 139/87 138/87 116/69  Pulse: 90 92 89 92  Resp: 18 (!) 24  (!) 22  Temp: 98 F (36.7 C) (!) 97.4 F (36.3 C)  97.7 F (36.5 C)  TempSrc: Axillary Oral  Oral  SpO2: 95% 97%  94%  Weight:      Height:        General: Pt lying comfortably in bed & appears in no obvious  distress. Cardiovascular: S1 & S2 heard, RRR, S1/S2 +. No murmurs, rubs, gallops or clicks. No JVD or pedal edema. Respiratory: Clear to auscultation without wheezing, rhonchi or crackles. No increased work of breathing. Abdominal:  Non-distended, non-tender & soft. No organomegaly or masses appreciated. Normal bowel sounds heard. CNS: Alert and oriented. No focal deficits. Extremities: no edema, no cyanosis      The results of significant diagnostics from this hospitalization (including imaging, microbiology, ancillary and laboratory) are listed below for reference.      Microbiology:   Recent Results (from the past 240 hour(s))  Blood culture (routine x 2)     Status: None   Collection Time: 08/06/20 12:35 PM   Specimen: BLOOD  Result Value Ref Range Status   Specimen Description   Final    BLOOD RIGHT ANTECUBITAL Performed at [facility], [place] [address], [place], [place] [address]    Special Requests   Final    BOTTLES DRAWN AEROBIC AND ANAEROBIC Blood Culture adequate volume Performed at [facility] [address], [place], [place] [address]    Culture   Final    NO GROWTH 5 DAYS Performed at [facility], [place] [address], [place], [place] [address]    Report Status 08/11/2020 FINAL  Final  Urine culture     Status: Abnormal   Collection Time: 08/06/20  3:35 PM   Specimen: Urine, Random  Result Value Ref Range Status   Specimen Description   Final    URINE, RANDOM Performed at [facility] [address] [place], [place] [address]    Special Requests   Final    NONE Performed at [facility], [place] [address] [place], [place] [address]    Culture (A)  Final    <10,000 COLONIES/mL INSIGNIFICANT GROWTH Performed at [facility] [address], [place], [place] [address]    Report Status 08/08/2020 FINAL  Final  Resp Panel by RT-PCR (Flu A&B, Covid) Nasopharyngeal Swab     Status: None    Collection Time: 08/06/20  5:28 PM   Specimen: Nasopharyngeal Swab; Nasopharyngeal(NP) swabs in vial transport medium  Result Value Ref Range Status   SARS Coronavirus 2 by RT PCR NEGATIVE NEGATIVE Final    Comment: (NOTE) SARS-CoV-2 target nucleic acids are NOT DETECTED.  The SARS-CoV-2 RNA is generally detectable in upper respiratory specimens during the acute phase of infection. The lowest concentration of SARS-CoV-2 viral copies this assay can detect is 138 copies/mL. A negative result does not preclude SARS-Cov-2 infection and should not be used as the sole basis for treatment or other patient management decisions. A negative result  may occur with  improper specimen collection/handling, submission of specimen other than nasopharyngeal swab, presence of viral mutation(s) within the areas targeted by this assay, and inadequate number of viral copies(<138 copies/mL). A negative result must be combined with clinical observations, patient history, and epidemiological information. The expected result is Negative.  Fact Sheet for Patients:  EntrepreneurPulse.com.au  Fact Sheet for Healthcare Providers:  IncredibleEmployment.be  This test is no t yet approved or cleared by the [place] FDA and  has been authorized for detection and/or diagnosis of SARS-CoV-2 by FDA under an Emergency Use Authorization (EUA). This EUA will remain  in effect (meaning this test can be used) for the duration of the COVID-19 declaration under Section 564(b)(1) of the Act, 21 U.S.C.section 360bbb-3(b)(1), unless the authorization is terminated  or revoked sooner.       Influenza A by PCR NEGATIVE NEGATIVE Final   Influenza B by PCR NEGATIVE NEGATIVE Final    Comment: (NOTE) The Xpert Xpress SARS-CoV-2/FLU/RSV plus assay is intended as an aid in the diagnosis of influenza from Nasopharyngeal swab specimens and should not be used as a sole basis for treatment.  Nasal washings and aspirates are unacceptable for Xpert Xpress SARS-CoV-2/FLU/RSV testing.  Fact Sheet for Patients: EntrepreneurPulse.com.au  Fact Sheet for Healthcare Providers: IncredibleEmployment.be  This test is not yet approved or cleared by the [place] FDA and has been authorized for detection and/or diagnosis of SARS-CoV-2 by FDA under an Emergency Use Authorization (EUA). This EUA will remain in effect (meaning this test can be used) for the duration of the COVID-19 declaration under Section 564(b)(1) of the Act, 21 U.S.C. section 360bbb-3(b)(1), unless the authorization is terminated or revoked.  Performed at [facility], [place] [address] [place], [place] [address]      Labs:   CBC: Recent Labs  Lab 08/08/20 0601 08/14/20 0525  WBC 13.1* 13.2*  HGB 11.8* 12.1  HCT 39.2 39.3  MCV 90.7 89.1  PLT 330 989   Basic Metabolic Panel: Recent Labs  Lab 08/09/20 1629 08/10/20 0607 08/11/20 0551 08/13/20 0731 08/14/20 0525  NA 135 135 135 138 136  K 5.1 4.8 4.7 5.4* 5.6*  CL 99 100 103 105 105  CO2 19* 20* 21* 20* 19*  GLUCOSE 120* 127* 119* 96 97  BUN 55* 59* 51* 64* 68*  CREATININE 2.58* 2.40* 1.96* 2.68* 2.93*  CALCIUM 8.6* 8.4* 8.4* 8.8* 8.7*   Liver Function Tests: Recent Labs  Lab 08/09/20 0546 08/10/20 0607 08/14/20 0525  AST 97* 104* 163*  ALT 41 44 50*  ALKPHOS 332* 441* 400*  BILITOT 1.3* 1.3* 1.8*  PROT 5.9* 5.5* 5.5*  ALBUMIN 2.6* 2.5* 2.5*   BNP (last 3 results) No results for input(s): BNP in the last 8760 hours. Cardiac Enzymes: No results for input(s): CKTOTAL, CKMB, CKMBINDEX, TROPONINI in the last 168 hours. CBG: No results for input(s): GLUCAP in the last 168 hours. Hgb A1c No results for input(s): HGBA1C in the last 72 hours. Lipid Profile No results for input(s): CHOL, HDL, LDLCALC, TRIG, CHOLHDL, LDLDIRECT in the last 72 hours. Thyroid function studies No  results for input(s): TSH, T4TOTAL, T3FREE, THYROIDAB in the last 72 hours.  Invalid input(s): FREET3 Anemia work up No results for input(s): VITAMINB12, FOLATE, FERRITIN, TIBC, IRON, RETICCTPCT in the last 72 hours. Urinalysis    Component Value Date/Time   COLORURINE YELLOW 08/06/2020 1535   APPEARANCEUR HAZY (A) 08/06/2020 1535   LABSPEC 1.018 08/06/2020 1535   PHURINE 5.0  08/06/2020 [place] 08/06/2020 1535   HGBUR NEGATIVE 08/06/2020 1535   BILIRUBINUR NEGATIVE 08/06/2020 1535   KETONESUR 5 (A) 08/06/2020 1535   PROTEINUR NEGATIVE 08/06/2020 1535   UROBILINOGEN 0.2 09/25/2010 0357   NITRITE NEGATIVE 08/06/2020 1535   LEUKOCYTESUR SMALL (A) 08/06/2020 1535         Time coordinating discharge: Over 45 minutes  SIGNED: Deatra James, MD, FACP, [facility]. [facility],  Please use [email or web address] to Page If 7PM-7AM, please contact night-coverage [email or web address] [facility] 08/14/2020, 11:36 AM

## 2020-08-14 NOTE — TOC Transition Note (Addendum)
Transition of Care [facility]) - CM/SW Discharge Note   Patient Details  Name: Chelsea Baker MRN: [id number] Date of Birth: 24-Sep-1945  [facility] CM/SW Contact:  Ross Ludwig, LCSW Phone Number: 08/14/2020, 2:25 PM   Clinical Narrative:     Patient will be going home with home hospcie through [place].  CSW signing off please reconsult with any other social work needs, home hospice agency has been notified of planned discharge.  Daughter is requesting [facility] transport, it has been approved by [employer], reference number [id number].  Daughter is aware that patient is discharging today.    Final next level of care: Home w Hospice Care Barriers to Discharge: Barriers Resolved   Patient Goals and CMS Choice Patient states their goals for this hospitalization and ongoing recovery are:: To return back home with hospice services. CMS Medicare.gov Compare Post Acute Care list provided to:: Patient Represenative (must comment) Choice offered to / list presented to : Adult Children  Discharge Placement  Home with Hospice Services.                Name of family member notified: Rochel Brome Daughter [phone] Patient and family notified of of transfer: 08/14/20  Discharge Plan and Services In-house Referral: Clinical Social Work Discharge Planning Services: CM Consult Post Acute Care Choice: Hospice          DME Arranged: Bedside commode,Hospital bed,Wheelchair manual,Walker rolling DME Agency:  ([place]) Date DME Agency Contacted: 08/14/20 Time DME Agency Contacted: 6 Representative spoke with at DME Agency: Cheri from [place]:  (Hospice services at home.) [place]: [place] Date [place]: 08/09/20 Time [place]: 49 Representative spoke with at [place]: [place] ([place]) Interventions     Readmission Risk  Interventions Readmission Risk Prevention Plan 08/09/2020  Transportation Screening Complete  PCP or Specialist Appt within 3-5 Days Complete  HRI or Home Care Consult Complete  Palliative Care Screening Complete  Medication Review (RN Care Manager) Complete  Some recent data might be hidden

## 2020-08-14 NOTE — Progress Notes (Signed)
PHYSICAL THERAPY  Pt just got back to bed from amb to and from bathroom with NT.  Max c/o fatigue and visible 2/4 dyspnea.  Unable to tolerate Physical Therapy at this moment.  Pt plans to D/C to home with Hospice when medically ready.  Will attempt to see another time/day during her Acute stay.  Rica Koyanagi  PTA Acute  Rehabilitation Services Pager      [phone] Office      [phone]

## 2020-08-15 ENCOUNTER — Telehealth: Payer: Self-pay

## 2020-08-15 NOTE — Telephone Encounter (Signed)
Transition Care Management Unsuccessful Follow-up Telephone Call  Date of discharge and from where:  08/14/2020-[place]  Attempts:  1st Attempt  Reason for unsuccessful TCM follow-up call:  Left voice message

## 2020-08-15 NOTE — Telephone Encounter (Signed)
Transition Care Management Unsuccessful Follow-up Telephone Call  Date of discharge and from where:  08/14/20-[place]  Attempts:  2nd Attempt  Reason for unsuccessful TCM follow-up call:  No answer/busy

## 2020-08-16 NOTE — Telephone Encounter (Signed)
Transition Care Management Unsuccessful Follow-up Telephone Call  Date of discharge and from where:  08/14/20-[place]  Attempts:  3rd Attempt  Reason for unsuccessful TCM follow-up call:  No answer/busy

## 2020-08-20 ENCOUNTER — Encounter (HOSPITAL_COMMUNITY): Payer: Self-pay

## 2020-08-27 DEATH — deceased

## 2020-08-28 ENCOUNTER — Ambulatory Visit (HOSPITAL_BASED_OUTPATIENT_CLINIC_OR_DEPARTMENT_OTHER): Payer: PPO

## 2020-09-23 ENCOUNTER — Ambulatory Visit: Payer: PPO | Admitting: Cardiology

## 2021-12-03 ENCOUNTER — Other Ambulatory Visit: Payer: Self-pay | Admitting: Oncology
